# Patient Record
Sex: Male | Born: 1935
Health system: Southern US, Community
[De-identification: ages and names within clinical notes are randomized; demographics above are authoritative.]

## PROBLEM LIST (undated history)

## (undated) DIAGNOSIS — N183 Chronic kidney disease, stage 3 unspecified: Secondary | ICD-10-CM

## (undated) DIAGNOSIS — M1990 Unspecified osteoarthritis, unspecified site: Secondary | ICD-10-CM

## (undated) DIAGNOSIS — I5042 Chronic combined systolic (congestive) and diastolic (congestive) heart failure: Secondary | ICD-10-CM

## (undated) DIAGNOSIS — I493 Ventricular premature depolarization: Secondary | ICD-10-CM

## (undated) DIAGNOSIS — I472 Ventricular tachycardia: Secondary | ICD-10-CM

## (undated) DIAGNOSIS — E039 Hypothyroidism, unspecified: Secondary | ICD-10-CM

## (undated) DIAGNOSIS — G4730 Sleep apnea, unspecified: Secondary | ICD-10-CM

## (undated) DIAGNOSIS — D649 Anemia, unspecified: Secondary | ICD-10-CM

## (undated) DIAGNOSIS — C4359 Malignant melanoma of other part of trunk: Secondary | ICD-10-CM

## (undated) DIAGNOSIS — Z8739 Personal history of other diseases of the musculoskeletal system and connective tissue: Secondary | ICD-10-CM

## (undated) DIAGNOSIS — I447 Left bundle-branch block, unspecified: Secondary | ICD-10-CM

## (undated) DIAGNOSIS — E785 Hyperlipidemia, unspecified: Secondary | ICD-10-CM

## (undated) DIAGNOSIS — M353 Polymyalgia rheumatica: Secondary | ICD-10-CM

## (undated) DIAGNOSIS — I2510 Atherosclerotic heart disease of native coronary artery without angina pectoris: Secondary | ICD-10-CM

## (undated) DIAGNOSIS — I4729 Other ventricular tachycardia: Secondary | ICD-10-CM

## (undated) DIAGNOSIS — M797 Fibromyalgia: Secondary | ICD-10-CM

## (undated) HISTORY — DX: Hyperlipidemia, unspecified: E78.5

## (undated) HISTORY — DX: Hypothyroidism, unspecified: E03.9

## (undated) HISTORY — PX: CATARACT EXTRACTION W/ INTRAOCULAR LENS  IMPLANT, BILATERAL: SHX1307

## (undated) HISTORY — DX: Polymyalgia rheumatica: M35.3

## (undated) HISTORY — DX: Atherosclerotic heart disease of native coronary artery without angina pectoris: I25.10

---

## 1998-02-09 ENCOUNTER — Ambulatory Visit (HOSPITAL_COMMUNITY): Admission: RE | Admit: 1998-02-09 | Discharge: 1998-02-09 | Payer: Self-pay

## 1998-02-21 ENCOUNTER — Ambulatory Visit (HOSPITAL_BASED_OUTPATIENT_CLINIC_OR_DEPARTMENT_OTHER): Admission: RE | Admit: 1998-02-21 | Discharge: 1998-02-21 | Payer: Self-pay

## 1998-02-28 ENCOUNTER — Ambulatory Visit (HOSPITAL_COMMUNITY): Admission: RE | Admit: 1998-02-28 | Discharge: 1998-03-01 | Payer: Self-pay

## 1998-06-02 ENCOUNTER — Ambulatory Visit (HOSPITAL_COMMUNITY): Admission: RE | Admit: 1998-06-02 | Discharge: 1998-06-02 | Payer: Self-pay

## 1999-05-01 HISTORY — PX: AXILLARY LYMPH NODE DISSECTION: SHX5229

## 1999-05-01 HISTORY — PX: MELANOMA EXCISION: SHX5266

## 1999-07-12 ENCOUNTER — Encounter: Admission: RE | Admit: 1999-07-12 | Discharge: 1999-07-12 | Payer: Self-pay

## 1999-07-13 ENCOUNTER — Ambulatory Visit (HOSPITAL_BASED_OUTPATIENT_CLINIC_OR_DEPARTMENT_OTHER): Admission: RE | Admit: 1999-07-13 | Discharge: 1999-07-13 | Payer: Self-pay

## 1999-09-26 ENCOUNTER — Ambulatory Visit (HOSPITAL_BASED_OUTPATIENT_CLINIC_OR_DEPARTMENT_OTHER): Admission: RE | Admit: 1999-09-26 | Discharge: 1999-09-26 | Payer: Self-pay

## 1999-09-26 ENCOUNTER — Encounter (INDEPENDENT_AMBULATORY_CARE_PROVIDER_SITE_OTHER): Payer: Self-pay | Admitting: Specialist

## 1999-12-11 ENCOUNTER — Ambulatory Visit (HOSPITAL_BASED_OUTPATIENT_CLINIC_OR_DEPARTMENT_OTHER): Admission: RE | Admit: 1999-12-11 | Discharge: 1999-12-11 | Payer: Self-pay

## 1999-12-11 ENCOUNTER — Encounter (INDEPENDENT_AMBULATORY_CARE_PROVIDER_SITE_OTHER): Payer: Self-pay | Admitting: *Deleted

## 2001-04-28 ENCOUNTER — Ambulatory Visit (HOSPITAL_COMMUNITY): Admission: RE | Admit: 2001-04-28 | Discharge: 2001-04-28 | Payer: Self-pay | Admitting: Cardiology

## 2001-04-28 HISTORY — PX: CARDIAC CATHETERIZATION: SHX172

## 2001-05-13 ENCOUNTER — Encounter: Payer: Self-pay | Admitting: Surgery

## 2001-05-15 ENCOUNTER — Inpatient Hospital Stay (HOSPITAL_COMMUNITY): Admission: RE | Admit: 2001-05-15 | Discharge: 2001-05-20 | Payer: Self-pay | Admitting: Surgery

## 2001-05-15 ENCOUNTER — Encounter: Payer: Self-pay | Admitting: Surgery

## 2001-05-15 HISTORY — PX: CORONARY ARTERY BYPASS GRAFT: SHX141

## 2001-05-16 ENCOUNTER — Encounter: Payer: Self-pay | Admitting: Surgery

## 2001-05-17 ENCOUNTER — Encounter: Payer: Self-pay | Admitting: Surgery

## 2001-06-03 ENCOUNTER — Encounter (HOSPITAL_COMMUNITY): Admission: RE | Admit: 2001-06-03 | Discharge: 2001-09-01 | Payer: Self-pay | Admitting: Cardiology

## 2003-01-18 ENCOUNTER — Encounter: Payer: Self-pay | Admitting: Gastroenterology

## 2003-09-07 ENCOUNTER — Ambulatory Visit (HOSPITAL_COMMUNITY): Admission: RE | Admit: 2003-09-07 | Discharge: 2003-09-07 | Payer: Self-pay | Admitting: Internal Medicine

## 2007-11-07 ENCOUNTER — Encounter: Admission: RE | Admit: 2007-11-07 | Discharge: 2007-11-07 | Payer: Self-pay | Admitting: Internal Medicine

## 2007-12-22 ENCOUNTER — Encounter: Admission: RE | Admit: 2007-12-22 | Discharge: 2007-12-22 | Payer: Self-pay | Admitting: Rheumatology

## 2008-02-03 ENCOUNTER — Encounter: Payer: Self-pay | Admitting: Gastroenterology

## 2008-02-06 ENCOUNTER — Encounter: Payer: Self-pay | Admitting: Gastroenterology

## 2008-03-12 ENCOUNTER — Ambulatory Visit: Payer: Self-pay | Admitting: Gastroenterology

## 2008-03-12 DIAGNOSIS — D509 Iron deficiency anemia, unspecified: Secondary | ICD-10-CM | POA: Insufficient documentation

## 2008-03-12 DIAGNOSIS — K219 Gastro-esophageal reflux disease without esophagitis: Secondary | ICD-10-CM | POA: Insufficient documentation

## 2008-03-18 LAB — CONVERTED CEMR LAB: Tissue Transglutaminase Ab, IgA: 0.5 units (ref <7)

## 2008-04-06 ENCOUNTER — Encounter: Payer: Self-pay | Admitting: Gastroenterology

## 2008-04-06 ENCOUNTER — Ambulatory Visit: Payer: Self-pay | Admitting: Gastroenterology

## 2008-04-06 LAB — CONVERTED CEMR LAB: UREASE: NEGATIVE

## 2008-04-08 ENCOUNTER — Encounter: Payer: Self-pay | Admitting: Gastroenterology

## 2009-05-12 ENCOUNTER — Encounter: Admission: RE | Admit: 2009-05-12 | Discharge: 2009-08-10 | Payer: Self-pay | Admitting: Internal Medicine

## 2010-06-01 NOTE — Miscellaneous (Signed)
Summary: Clotest  Clinical Lists Changes  Orders: Added new Test order of TLB-H Pylori Screen Gastric Biopsy (83013-CLOTEST) - Signed 

## 2010-06-01 NOTE — Procedures (Signed)
Summary: Colonoscopy   Colonoscopy  Procedure date:  04/06/2008  Findings:      Location:  [facility].    COLONOSCOPY PROCEDURE REPORT  PATIENT:  Gary Gonzalez, Gary Gonzalez  MR#:  [id number] BIRTHDATE:   [birth year]   GENDER:   male  ENDOSCOPIST:   Venita Lick. Russella Dar, MD, [facility] Referred by: Rodrigo Ran, M.D.  PROCEDURE DATE:  04/06/2008 PROCEDURE:  Colonoscopy, diagnostic ASA CLASS:   Class II INDICATIONS: 1) iron deficiency anemia   MEDICATIONS:    Fentanyl 75 mcg IV, Versed 9 mg IV  DESCRIPTION OF PROCEDURE:   After the risks benefits and alternatives of the procedure were thoroughly explained, informed consent was obtained.  Digital rectal exam was performed and revealed no abnormalities.   The LB [id number] endoscope was introduced through the anus and advanced to the cecum, which was identified by both the appendix and ileocecal valve, limited by a tortuous colon.    The quality of the prep was excellent.  The instrument was then slowly withdrawn as the colon was fully examined. <<PROCEDUREIMAGES>>              <<OLD IMAGES>>  FINDINGS:  Mild diverticulosis was found in the transverse colon.  Moderate diverticulosis was found in the sigmoid colon (see image16, image17, and image18).  The examination was otherwise normal.   Retroflexed views in the rectum revealed no abnormalities.    The time to cecum =  10.5  minutes. The scope withdrawn time = 10.5 min from the patient and the procedure completed.  COMPLICATIONS:   None  ENDOSCOPIC IMPRESSION:  1) Mild diverticulosis in the transverse colon  2) Moderate diverticulosis in the sigmoid colon  3) Otherwise normal examination RECOMMENDATIONS:  1) upper endoscopy today  2) high fiber diet  REPEAT EXAM:   No recall planned due to age and absence of polyps on todays exam   _______________________________ Venita Lick. Russella Dar, MD, [facility]    This report was created from the original endoscopy report, which was reviewed  and signed by the above listed endoscopist.

## 2010-06-01 NOTE — Procedures (Signed)
Summary: Colonoscopy   Colonoscopy  Procedure date:  01/18/2003  Findings:      Results: Hemorrhoids.     Results: Diverticulosis.       Location:  [facility].    Patient Name: Gary Gonzalez, Gary Gonzalez. MRN:  Procedure Procedures: Colonoscopy CPT: [phone].  Personnel: Endoscopist: Venita Lick. Russella Dar, MD, [facility].  Referred By: Rodrigo Ran, MD.  Exam Location: Exam performed in Outpatient Clinic. Outpatient  Patient Consent: Procedure, Alternatives, Risks and Benefits discussed, consent obtained, from patient. Consent was obtained by the RN.  Indications Symptoms: Weight Loss.  History  Pre-Exam Physical: Performed Jan 18, 2003. Entire physical exam was normal.  Exam Exam: Extent of exam reached: Cecum, extent intended: Cecum.  The cecum was identified by appendiceal orifice and IC valve. Colon retroflexion performed. ASA Classification: II. Tolerance: good.  Monitoring: Pulse and BP monitoring, Oximetry used. Supplemental O2 given.  Colon Prep Used Golytely for colon prep. Prep results: good.  Sedation Meds: Patient assessed and found to be appropriate for moderate (conscious) sedation. Fentanyl 100 mcg. given IV. Versed 10 mg. given IV.  Findings NORMAL EXAM: Splenic Flexure.  DIVERTICULOSIS: Transverse Colon. Not bleeding. ICD9: Diverticulosis: 562.10. Comments: scattered diverticulosis.  - DIVERTICULOSIS: Descending Colon to Sigmoid Colon. Not bleeding. ICD9: Diverticulosis: 562.10.  NORMAL EXAM: Cecum to Hepatic Flexure.  HEMORRHOIDS: Internal. Size: Small. Not bleeding. Not thrombosed. ICD9: Hemorrhoids, Internal: 455.0.    Comments: Tortuous colon-moderately difficult procedure Assessment  Diagnoses: 562.10: Diverticulosis.  455.0: Hemorrhoids, Internal.   Events  Unplanned Interventions: No intervention was required.  Unplanned Events: There were no complications. Plans Medication Plan: Continue current medications.  Patient  Education: Patient given standard instructions for: Diverticulosis. Hemorrhoids.  Disposition: After procedure patient sent to recovery. After recovery patient sent home.  Scheduling/Referral: Referring provider, to Rodrigo Ran, MD,    This report was created from the original endoscopy report, which was reviewed and signed by the above listed endoscopist.    cc: Rodrigo Ran, MD

## 2010-06-01 NOTE — Procedures (Signed)
Summary: EGD   EGD  Procedure date:  04/06/2008  Findings:      Location: [facility]    ENDOSCOPY PROCEDURE REPORT  PATIENT:  Gary Gonzalez, Gary Gonzalez  MR#:  [id number] BIRTHDATE:   Dec 18, 1926   GENDER:   male  ENDOSCOPIST:   Venita Lick. Russella Dar, MD, [facility] Referred by: Rodrigo Ran, M.D.  PROCEDURE DATE:  04/06/2008 PROCEDURE:  EGD with biopsy ASA CLASS:   Class II INDICATIONS: GERD, iron deficiency anemia   MEDICATIONS:    Fentanyl 25 mcg IV, Versed 1 mg IV TOPICAL ANESTHETIC:   Exactacain Spray  DESCRIPTION OF PROCEDURE:   After the risks benefits and alternatives of the procedure were thoroughly explained, informed consent was obtained.  The LB [id number]-H180 [id number] endoscope was introduced through the mouth and advanced to the second portion of the duodenum, without limitations.  The instrument was slowly withdrawn as the mucosa was fully examined. <<PROCEDUREIMAGES>>      <<OLD IMAGES>>  An ulcer was found in the bulb of the duodenum. It was 4 mm in size (see image4).  Mild gastritis was found at the pylorus.    Retroflexed views revealed no abnormalities.    The scope was then withdrawn from the patient and the procedure completed.  COMPLICATIONS:   None  ENDOSCOPIC IMPRESSION:  1) 4 mm ulcer in the bulb of duodenum  2) Mild gastritis at the pylorus RECOMMENDATIONS:  1) await biopsy results  2) PPI qam  REPEAT EXAM:   No   _______________________________ Venita Lick. Russella Dar, MD, [facility]      REPORT OF SURGICAL PATHOLOGY   Case #: [id number] Patient Name: Gary, Gonzalez. Office Chart Number:  [id number]   MRN: [id number] Pathologist: Adiel Erney. Luisa Hart, MD DOB/Age  [birth year] (Age: 33)    Gender: M Date Taken:  04/06/2008 Date Received: 04/07/2008   FINAL DIAGNOSIS   ***MICROSCOPIC EXAMINATION AND DIAGNOSIS***   DUODENUM:  BENIGN SMALL BOWEL MUCOSA.  NO ACTIVE INFLAMMATION OR VILLOUS ATROPHY IDENTIFIED.   COMMENT There is small bowel mucosa with normal villous  architecture and no objective increase in inflammation.  No villous atrophy, active inflammation or other significant changes identified.   kv Date Reported:  04/08/2008     Beulah Gandy. Luisa Hart, MD *** Electronically Signed Out By JDP ***                     April 08, 2008 MRN: [id number]    [facility] [address] [place], [place]  [address]    Dear Gary Gonzalez,  I am pleased to inform you that the biopsies taken during your recent endoscopic examination did not show any evidence of cancer upon pathologic examination. The duodenal and gastric biopsies were normal.  Continue with the treatment plan as outlined on the day of your      exam.  Please call us if you are having persistent problems or have questions about your condition that have not been fully answered at this time.  Sincerely,  Meryl Dare MD [facility]  This letter has been electronically signed by your physician.    This report was created from the original endoscopy report, which was reviewed and signed by the above listed endoscopist.

## 2010-06-01 NOTE — Procedures (Signed)
Summary: Endoscopy   EGD  Procedure date:  01/18/2003  Findings:      Findings: Esophagitis  Location: [facility]    Patient Name: Gary Gonzalez, Gary Gonzalez. MRN:  Procedure Procedures: Panendoscopy (EGD) CPT: 43235.  Personnel: Endoscopist: Venita Lick. Russella Dar, MD, [facility].  Referred By: Rodrigo Ran, MD.  Exam Location: Exam performed in Outpatient Clinic. Outpatient  Patient Consent: Procedure, Alternatives, Risks and Benefits discussed, consent obtained, from patient. Consent was obtained by the RN.  Indications Symptoms: Weight loss.  History  Pre-Exam Physical: Performed Jan 18, 2003  Entire physical exam was normal.  Exam Exam Info: Maximum depth of insertion Duodenum, intended Duodenum. Vocal cords not visualized. Gastric retroflexion performed. ASA Classification: II. Tolerance: excellent.  Sedation Meds: Patient assessed and found to be appropriate for moderate (conscious) sedation. Residual sedation present from prior procedure today. Cetacaine Spray 2 sprays given aerosolized.  Monitoring: BP and pulse monitoring done. Oximetry used. Supplemental O2 given  Findings Normal: Proximal Esophagus to Mid Esophagus.  ESOPHAGEAL INFLAMMATION: Severity is moderate, erosions present.  [place] Classification: Grade A. ICD9: Esophagitis, Reflux: 530.11.  HIATAL HERNIA: Regular, 2 cms. in length. ICD9: Hernia, Hiatal: 553.3. Normal: Fundus to Duodenal 2nd Portion.   Assessment  Diagnoses: 530.11: Esophagitis, Reflux.  553.3: Hernia, Hiatal.   Events  Unplanned Intervention: No unplanned interventions were required.  Unplanned Events: There were no complications. Plans Medication(s): Continue current medications. PPI: Pantoprazole/Protonix 40 mg QAM,   Patient Education: Patient given standard instructions for: Hiatal Hernia. Reflux.  Disposition: After procedure patient sent to recovery. After recovery patient sent home.  Scheduling: Referring  provider, to Rodrigo Ran, MD, around Feb 15, 2003.  Office Visit, to [employer]. Russella Dar, MD, [facility], prn    This report was created from the original endoscopy report, which was reviewed and signed by the above listed endoscopist.    cc: Rodrigo Ran, MD

## 2010-06-01 NOTE — Letter (Signed)
Summary: Patient Notice-Endo Biopsy Results  [facility]  [address] [place], [place] [address]   Phone: [phone]  Fax: [phone]        April 08, 2008 MRN: [id number]    Gary Gonzalez [address] [place], [place]  [address]    Dear Mr. SERANO,  I am pleased to inform you that the biopsies taken during your recent endoscopic examination did not show any evidence of cancer upon pathologic examination. The duodenal and gastric biopsies were normal.  Continue with the treatment plan as outlined on the day of your      exam.  Please call us if you are having persistent problems or have questions about your condition that have not been fully answered at this time.  Sincerely,  Meryl Dare MD [facility]  This letter has been electronically signed by your physician.

## 2010-06-13 ENCOUNTER — Other Ambulatory Visit: Payer: Self-pay | Admitting: Dermatology

## 2010-09-15 NOTE — Cardiovascular Report (Signed)
[place]. [facility]  Patient:    Gary Gonzalez, Gary Gonzalez Visit Number: [id number] MRN: [id number]          Service Type: CAT Location: [facility] 2899 10 Attending Physician:  [place], Peter Manning Dictated by:   [name]. [place], M.D. Proc. Date: 04/28/01 Admit Date:  04/28/2001 Discharge Date: 04/28/2001   CC:         Rodrigo Ran, M.D.  Alleen Borne, M.D.   Cardiac Catheterization  INDICATIONS:  The patient is a 77 year old white male generally in excellent health who has had recent episodes of near syncope.  Subsequently, stress echocardiogram showed significant evidence of ischemia in the anterior apical and septal wall motion areas.  Access is via the right femoral artery using standard Seldinger technique.  INSTRUMENTS:  A 6 French 4 cm right and left Judkins catheter.  A 6 French pigtail catheter, 6 French arterial sheath.  MEDICATIONS:  Local anesthesia with 1% Xylocaine, contrast 150 cc of Omnipaque.  RESULTS:  Hemodynamic data: 1. Aortic pressure is 123/55 with a mean of 84. 2. Left ventricular pressure is 118 with an EP of 12 mmHg.  Angiographic data: 1. Left coronary artery rises and distributes normally.  There is moderate    calcification in the proximal LAD.  Left main coronary is without    significant stenosis. 2. The left anterior descending coronary artery has a diffuse segmental    disease involving the proximal to mid-vessel.  This entire segment is    diffusely diseased up to 70-80%.  This also involves the bifurcation with    the first diagonal which has a 90% ostial stenosis.  This LAD is    approximately 2.5 mm in diameter. 3. Left circumflex coronary has a bulky plaque in the mid-vessel up to 50-60%. 4. The right coronary artery is a dominant vessel.  It is without significant    disease.  Left ventriculography (performed in the RAO view):  Demonstrates normal left ventricular size and contractility with normal systolic  function.  Ejection fraction is estimated at 60%.  There is no mitral regurgitation prolapse.  FINAL INTERPRETATION: 1. Two vessel obstructive atherosclerotic coronary artery disease.  The    patient has diffuse segmental disease involving the proximal to mid-left    anterior descending coronary artery and the origin of the first diagonal    branch which would be consistent with his prior stress echocardiogram    findings.  This lesion would be poorly suited to catheter-based    intervention. 2. Normal left ventricular function.  PLAN:  Would consider for coronary artery bypass surgery with arterial grafting of the diagonal and LAD and also grafting of the circumflex coronary artery. Dictated by:   [name]. [place], M.D. Attending Physician:  [place], Peter Manning DD:  04/28/01 TD:  04/28/01 Job: [id number]

## 2010-09-15 NOTE — H&P (Signed)
[place]. [facility]  Patient:    Gary Gonzalez, Gary Gonzalez Visit Number: [id number] MRN: [id number]          Service Type: Attending:  [name]. [place]il[place], M.D. Dictated by:   [name]. [place], M.D. Adm. Date:  04/21/01   CC:         Rodrigo Ran, M.D.   History and Physical  DATE OF BIRTH: 23-Mar-1927  CHIEF COMPLAINT: Mr. Abdalla is a 77 year old white male, admitted for cardiac catheterization following an abnormal stress echocardiogram study.  HISTORY OF PRESENT ILLNESS: The patient in general has been in good health. He does have a history of mild hypercholesterolemia.  Approximately one month ago while attending the [employer] [facility] [place] football game the patient developed a near syncopal episode.  This was associated with symptoms of severe light headedness, sweatiness, and feeling that he might black out.  He laid down for a while and his symptoms abated, and altogether lasted less than one hour.  He has felt fine since then but has noticed some mild light headedness when he has been playing tennis.  He subsequently underwent an echocardiogram on March 26, 2001.  This demonstrated global hypokinesia with moderate hypokinesia involving the inferior basal wall.  His left ventricular function was felt to be mildly reduced, with ejection fraction of 40-50%.  He was also noted to have mild to moderate mitral insufficiency.  The patient was subsequently referred for a stress echocardiogram.  This was performed on April 21, 2001.  With exercise the patient developed dyspnea. His ECG showed 3 mm ST segment depression in leads 2, 3, aVF, and V4 through V6, consistent with ischemia.  His echocardiogram again demonstrated global hypokinesia at rest.  With exercise he developed severe hypokinesia of the septum, anterior wall, and apex, which would be consistent with ischemia. Because of these abnormal findings he is now admitted for cardiac catheterization.  PAST  MEDICAL HISTORY:  1. History of melanoma, status post resection and axillary dissection.  2. He was diagnosed with polymyalgia rheumatica in 1996 and was treated with     steroids for a period of time.  3. He had an acute febrile illness with diffuse myalgias in June 2002.  He has no history of diabetes or hypertension.  ALLERGIES: None known.  CURRENT MEDICATIONS: None.  SOCIAL HISTORY: The patient is retired.  He previously worked in the [employer].  He is married and has seven children.  He denies tobacco use.  He drinks occasional alcohol socially.  FAMILY HISTORY: Father died at age 7 of old age.  Mother died at age 37 with liver cancer.  He has three siblings in good health.  REVIEW OF SYSTEMS: Otherwise unremarkable.  PHYSICAL EXAMINATION:  GENERAL: The patient is a pleasant, thin white male in no apparent distress.  VITAL SIGNS: Blood pressure 130/62, pulse 54 and regular.  HEENT: Unremarkable.  PERRLA.  EOMI.  Oropharynx clear.  NECK: Supple without JVD, adenopathy, thyromegaly, or bruits.  LUNGS: Clear to auscultation and percussion.  CARDIAC: Regular rate and rhythm, normal S1 and S2, without gallops.  He has a soft grade 1-2/6 systolic murmur, heard best at the apex.  ABDOMEN: Soft, nontender, without masses or hepatosplenomegaly.  EXTREMITIES: Femoral and pedal pulses 2+ and symmetric.  No edema.  NEUROLOGIC: Nonfocal.  LABORATORY DATA: Resting ECG is normal.  IMPRESSION:  1. History of near syncope.  2. Abnormal stress Cardiolite study.  Findings were most consistent with  obstructive coronary disease.  This could represent a primary     cardiomyopathy.  3. Hypercholesterolemia.  4. History of melanoma.  5. History of polymyalgia rheumatica.  PLAN: The patient will be admitted for cardiac catheterization, with further therapy pending these results. Dictated by:   [name]. [place]il[place], M.D. Attending:  [name]. [place], M.D. DD:  04/21/01 TD:   04/22/01 Job: [id number]

## 2010-09-15 NOTE — Discharge Summary (Signed)
[place]. [facility]  Patient:    Gary Gonzalez, Gary Gonzalez Visit Number: [id number] MRN: [id number]          Service Type: SUR Location: 2000 2023 01 Attending Physician:  Cleatrice Burke Dictated by:   Maxwell Marion, RNFA Admit Date:  05/15/2001 Discharge Date: 05/20/2001   CC:         Alleen Borne, M.D.  Rodrigo Ran, M.D.  [name]. [place], M.D.   Discharge Summary  DATE OF BIRTH:  Feb 22, 1927.  DATE OF SURGERY:  05/15/01.  ADMISSION DIAGNOSIS:  Two vessel coronary artery disease.  PAST MEDICAL HISTORY: 1. Metastatic melanoma, 1999.    Status post left axillary dissection and immunotherapy, no recurrence. 2. Hypercholesterolemia.  ALLERGIES:  This man has no known drug allergies.  DISCHARGE DIAGNOSES: 1. Two vessel coronary artery disease status post coronary artery bypass graft 2. Postoperative atrial fibrillation, resolved.  BRIEF HISTORY:  The patient is a 77 year old Caucasian male who presented to his primary care [facility] in 11/02 after an episode of near syncope.  Dr. Rodrigo Ran recommended a stress echocardiogram which was performed on December 23rd and showed evidence of ischemia.  Cardiac catheterization was recommended and was performed on 04/28/01 and revealed severe two vessel coronary artery disease with normal LV function.  The patient was then referred to Dr. Laneta Simmers who evaluated him in the CVTS office on January 10th.  After examination of the patient and review of the records, including the catheterization films, Dr. Laneta Simmers recommended coronary artery bypass grafting for treatment of his two vessel disease.  The risks and benefits were discussed with the patient and he agreed to proceed.  On January 4th Doppler studies were performed at the CVTS office which revealed no significant coronary artery disease, and his ABIs were noted to be greater than 1.0 bilaterally.  HOSPITAL COURSE:  On 05/15/01 the patient was electively  admitted to [facility] under the care of Dr. Evelene Croon.  He underwent uncomplicated coronary artery bypass grafting times three.  Grafts placed at the time of procedure included a sequential graft from the left internal mammary artery to the diagonal and left anterior descending artery, saphenous vein graft was grafted to the obtuse marginal.  Vein was harvested from the right lower leg for the vein graft.  He tolerated the procedure well and was transferred in stable condition to the SICU.  He has remained hemodynamically stable throughout his postoperative course.  He did develop an atrial fibrillation with a rapid ventricular response early in the postoperative period.  He was started on amiodarone and converted to normal sinus rhythm. He has maintained normal sinus rhythm since then.  His amiodarone has been converted from intravenous to p.o. and he will go home on amiodarone.  The patient is making very good progress and recovery from his surgery.  He is anticipated being ready for discharge home tomorrow, May 20, 2001.  CONDITION ON DISCHARGE:  Improved.  INSTRUCTIONS ON DISCHARGE:  Medications, activity, diet, wound care and follow up appointments.  Please see the discharge instruction sheet for details.  MEDICATIONS ON DISCHARGE: 1. Enteric coated aspirin 325 mg p.o. q.d. 2. Ultram 50 mg one to two p.o. q.4-6h p.r.n. for pain. 3. Amiodarone 200 mg p.o. b.i.d. 4. Ferrous gluconate 300 mg p.o. q.d.  Patient has been instructed to resume the following home medications: 1. Toprol XL 25 mg p.o. q.d. k 2. Pravachol 40 mg p.o. q.d. 3. Multivitamin, one q.d.  FOLLOW UP: 1.  Patient has been asked to arrange an appointment to see Dr. Swaziland in his    office in approximately two weeks and have a chest x-ray taken at that    time. 2. He has an appointment to see Dr. Laneta Simmers at the CVTS office on June 10, 2001 at 9:45 a.m. Dictated by:   Maxwell Marion,  RNFA Attending Physician:  Cleatrice Burke DD:  05/19/01 TD:  05/20/01 Job: [id number]

## 2010-09-15 NOTE — Op Note (Signed)
[place]. [facility]  Patient:    Gary Gonzalez, PRETTYMAN Visit Number: [id number] MRN: [id number]          Service Type: SUR Location: [id number] Attending Physician:  Cleatrice Burke Dictated by:   Alleen Borne, M.D. Proc. Date: 05/15/01 Admit Date:  05/15/2001   CC:         [name]. Swaziland, M.D.  Cath lab   Operative Report  PREOPERATIVE DIAGNOSIS:  Severe two-vessel coronary artery disease.  POSTOPERATIVE DIAGNOSIS:  Severe two-vessel coronary artery disease.  OPERATION PERFORMED:  Median sternotomy, extracorporeal circulation, coronary artery bypass graft surgery x 3 using a sequential left internal mammary artery graft to the diagonal branch of the left anterior descending and the left anterior descending itself, with a saphenous vein graft to the obtuse marginal branch of the left circumflex coronary artery.  SURGEON:  Alleen Borne, M.D.  ASSISTANT:  Areta Haber, P.A.  ANESTHESIA:  General endotracheal.  INDICATIONS FOR PROCEDURE:  The patient is a 77 year old gentleman who had been in good health with the exception of a metastatic melanoma in 1999.  He recently presented with an episode of near syncope.  He subsequently underwent a stress echocardiogram which showed significant evidence of ischemia in the anterior and apical and septal areas.  He underwent cardiac catheterization on April 28, 2001 by Dr. Peter [place]aziland and this showed severe two-vessel disease with normal left ventricular function.  The LAD had diffuse proximal 70 to 80% stenosis involving the bifurcation of the first diagonal branch which had 90% ostial stenosis.  The left circumflex had a bulky plaque in the midvessel up to 50 to 60% stenosis.  The right coronary artery was dominant without stenosis.  Left ventricular ejection fraction was 60%.  After review of the angiogram and examination of the patient, it was felt that coronary artery bypass surgery was the  best treatment.  He did have a history of metastatic melanoma in 1999 but had no evidence of recurrence following treatment.  I discussed the operative procedure with the patient and his wife including alternatives to surgery, benefits, and risks including bleeding, possible blood transfusion, infection, stroke, myocardial infarction, and death.   understood and agreed to proceed with surgery.  DESCRIPTION OF PROCEDURE:  The patient was taken to the operating room and placed on the table in supine position.  After induction of general endotracheal anesthesia, a Foley catheter was placed in the bladder using sterile technique.  Then the chest, abdomen and both lower extremities were prepped and draped in the usual sterile manner.  The chest was entered through a median sternotomy incision and the pericardium opened in the midline. Examination of the heart showed good ventricular contractility.  The ascending aorta had no palpable plaques in it.  Then the left internal mammary artery was harvested from the chest wall as a pedicle graft.  This was a medium caliber vessel with excellent blood flow through it.  At the same time a segment of greater saphenous vein was harvested from the right lower lobe and this vein was of medium size and good quality.  Then the patient was heparinized and when an adequate activated clotting time was achieved, the distal ascending aorta was cannulated using 20 French aortic cannula for arterial inflow.  Venous outflow was achieved using a two-stage venous cannula through the right atrial appendage.  An antegrade cardioplegia and vent cannula was inserted into the aortic root.  The patient was placed on  cardiopulmonary bypass and the distal coronary arteries were identified.  The LAD was a large graftable vessel and was heavily diseased proximally but the remainder of the vessel appeared free of disease.  The diagonal branch was also a large vessel that was  diseased proximally but otherwise free of disease.  The obtuse marginal branch was a large graftable vessel with no disease.  Then the aorta was cross-clamped and 500 cc of cold blood antegrade cardioplegia was administered in the aortic root with quick arrest of the heart.  Systemic hypothermia to 20 degrees centigrade and topical hypothermia with iced saline was used.  A temperature probe was placed in the septum and an insulating pad in the pericardium.  The first distal anastomosis was performed to the obtuse marginal branch.  The internal diameter was 1.75 mm.  The conduit used was a segment of  greater saphenous vein.  Anastomosis was performed in end-to-side manner using continuous 7-0 Prolene suture.  The flow was measured through the graft and was excellent.  The second distal anastomosis was performed to the diagonal branch.  The internal diameter was 1.75 mm.  The conduit used was the left internal mammary graft and this was brought through an opening in the left pericardium anterior to the phrenic nerve.  It was anastomosed to the diagonal branch in a sequential side-to-side manner using continuous 8-0 Prolene suture.  The pedicle was tacked to the epicardium with 6-0 Prolene sutures.  The patient was then given another dose of cardioplegia.  The third distal anastomosis was performed to the midportion of the left anterior descending coronary artery.  The internal diameter in this area was about 2 mm.  The conduit used was a left internal mammary graft and this was anastomosed in a sequential end-to-side manner using continuous 8-0 Prolene suture.  The pedicle was tacked to the epicardium with 6-0 Prolene sutures. The patient was rewarmed to 37 degrees and the clamp removed from the mammary pedicle.  There was rapid warming of the ventricular septum and return of spontaneous ventricular fibrillation.  The crossclamp was removed with a time  of 49 minutes and the patient  spontaneously converted to sinus rhythm.  A partial occlusion clamp was placed on the aortic root and a single proximal vein graft anastomosis was performed in end-to-side manner using continuous 6-0 Prolene suture.  The clamps were removed, the vein grafts deaired and the clamps removed from them.  The proximal and distal anastomoses appeared hemostatic and the line of the grafts satisfactory.  Graft markers were placed around the proximal anastomoses.  Two temporary right ventricular and right atrial pacing wires were placed and brought out through the skin.  When the patient had rewarmed to 37 degrees centigrade, he was weaned from cardiopulmonary bypass on no inotropioc agents.  Total bypass time was 72 minutes.  Cardiac function appeared excellent with a cardiac output of 5L per minute.  Protamine was given and venous and aortic cannulas were removed without difficulty.  Hemostasis was achieved.  Three chest tubes were placed with a tube in the posterior pericardium and one in the left pleural space and one in the anterior mediastinum.  The pericardium was reapproximated over the heart.  The sternum was closed with #6 stainless steel wires.  The fascia was closed with continuous #1 Vicryl suture.  The subcutaneous tissues were closed using continuous 2-0 Vicryl and the skin with 3-0 Vicryl subcuticular closure. The lower extremity vein harvest site was closed in layers in a  similar manner with staples used for the skin.  The sponge, needle and instrument counts were correct according to the scrub nurse.  A dry sterile dressing was applied over the incisions and around the chest tubes which were hooked to Pleur-evac suction.  The patient remained hemodynamically stable and was transported to the SICU in guarded but stable condition. Dictated by:   Alleen Borne, M.D. Attending Physician:  Cleatrice Burke DD:  05/15/01 TD:  05/15/01 Job: [id number]

## 2010-09-15 NOTE — Op Note (Signed)
[place]. [facility]  Patient:    Gary Gonzalez, Gary Gonzalez                          MRN: [id number] Proc. Date: 12/11/99 Adm. Date:  [id number] Attending:  Gennie Alma CC:         [name]   Operative Report  [facility] [place] NUMBER:  [phone]  PREOPERATIVE DIAGNOSES: 1. Residual atypical melanocytic lesion of left upper back. 2. Ulcerating lesion of left back.  POSTOPERATIVE DIAGNOSES: 1. Residual atypical melanocytic lesion of left upper back. 2. Ulcerating lesion of left back.  OPERATION:  Excision of lesion #1 and lesion #2.  SURGEON:  Milus Mallick, M.D.  ANESTHESIA:  Local infiltration with 1% Xylocaine, 30 cc, and monitored anesthesia care.  HISTORY:  This patient with known metastatic melanoma had a lesion excised from his left upper back that was an atypical melanocytic lesion.  Because of closeness of margins, it was recommended by the pathology department that a conservative re-excision be done.  The conservative re-excision was done on Sep 26, 1999, and noted within the specimen was residual atypical melanocytic lesion, lateral margin involved.  There appeared to be a persistence of atypical melanocytic proliferation with the lymphocytic infiltrate in the central cross sections of the ellipse that was submitted.  In addition, the patient has developed an ulcerated lesion inferior to the previous lesion that will be excised at the same time.  DESCRIPTION OF PROCEDURE:  Under adequate perioperative intravenous sedation the patient was placed in the prone position and his back was prepped and draped in the usual fashion.  There was a scar in the left upper back that was erythematous, it was transverse, and was approximately 5-7 cm in length. An elliptical incision was designed to excise the scar, and in particular, get more widely around the scar in the lateral aspect of it.  An elliptical incision was designed that was 11 cm in transverse  length, and approximately 3 cm in width.  The area was infiltrated with 1% Xylocaine.  The elliptical incision was made and carried down to the deep fascia.  Bleeders were electrocaoagulated.  The entire area was excised by scalpel.  Some electrocoagulation was used.  Hemostasis was ascertained.  Bleeders were electrocoagulated.  The subcuticular layer was reapproximated with interrupted sutures of 4-0 Vicryl, and the skin was approximated with continuous suture of 4-0 nylon.  The specimen was marked with a double suture on the medial aspect and a single suture on the superior edge.  It was sent for routine pathologic study.  Approximately 6-8 inches inferior to that area and more lateral was an ulcerating lesion of the skin of the back, left side.  It was excised with an elliptical incision.  It was carried down to the deep fascia after infiltrating the area well with 1% Xylocaine.  Bleeders were electrocoagulated.  The subcuticular layer was reapproximated with 4-0 Vicryl, and the skin was approximated with a continuous suture of 4-0 nylon.  Sterile dressings were applied.  Estimated blood loss from the procedure was negligible.  The patient tolerated the procedure well, and left the operating room in satisfactory condition. DD:  12/11/99 TD:  12/11/99 Job: [id number]

## 2010-12-14 ENCOUNTER — Other Ambulatory Visit: Payer: Self-pay | Admitting: Dermatology

## 2011-03-30 ENCOUNTER — Ambulatory Visit (INDEPENDENT_AMBULATORY_CARE_PROVIDER_SITE_OTHER): Payer: Medicare Other | Admitting: Cardiology

## 2011-03-30 ENCOUNTER — Encounter: Payer: Self-pay | Admitting: Cardiology

## 2011-03-30 VITALS — BP 144/70 | HR 67 | Ht 72.0 in | Wt 155.0 lb

## 2011-03-30 DIAGNOSIS — E7800 Pure hypercholesterolemia, unspecified: Secondary | ICD-10-CM

## 2011-03-30 DIAGNOSIS — Z951 Presence of aortocoronary bypass graft: Secondary | ICD-10-CM

## 2011-03-30 DIAGNOSIS — I2510 Atherosclerotic heart disease of native coronary artery without angina pectoris: Secondary | ICD-10-CM

## 2011-03-30 DIAGNOSIS — I493 Ventricular premature depolarization: Secondary | ICD-10-CM

## 2011-03-30 DIAGNOSIS — I4949 Other premature depolarization: Secondary | ICD-10-CM

## 2011-03-30 NOTE — Progress Notes (Signed)
   Gary Gonzalez Date of Birth: Jun 06, 1926 Medical Record #[id number]  History of Present Illness: Gary Gonzalez is seen today at the request of Dr. Waynard Edwards to reestablish cardiac care. He is a pleasant 77 year old white male who has a history of coronary disease. He presented in 2002 with increased angina. He subsequently underwent coronary bypass surgery x3 by Dr. Laneta Simmers. This included an LIMA graft sequentially to the LAD and diagonal branches and a saphenous vein graft to the obtuse marginal branch. He has not had followup cardiac evaluation for over the past 5 years. He reports that he is doing fairly well. Ever since his bypass surgery he has had a little problem with equilibrium. He does report one episode of significant lightheadedness while driving this past summer. He otherwise denies any dizziness or lightheadedness. He has had no chest pain or shortness of breath.  No current outpatient prescriptions on file prior to visit.    Not on File  Past Medical History  Diagnosis Date  . Hyperlipidemia   . Melanoma   . Coronary artery disease     two vessel disease with CABG  . Hypothyroidism   . PMR (polymyalgia rheumatica)     Past Surgical History  Procedure Date  . Coronary artery bypass graft 05/15/2001    x3 sequential left internal mammary artery graft to the diagonal branch of the left anterior descending and the left anterior descending itself, with a saphenous vein graft to the obtuse marginal branch of the left circumflex coronary artery   . Cardiac catheterization 04/28/2001  . Melanoma excision 2001    off of back    History  Smoking status  . Never Smoker   Smokeless tobacco  . Never Used    History  Alcohol Use No    Family History  Problem Relation Age of Onset  . Liver cancer Mother   . Prostate cancer Father   . Hyperlipidemia Son   . Hyperlipidemia Daughter     Review of Systems: The review of systems is positive for chronic disequilibrium.  All other  systems were reviewed and are negative.  Physical Exam: BP 144/70  Pulse 67  Ht 6' (1.829 m)  Wt 155 lb (70.308 kg)  BMI 21.02 kg/m2 He is a pleasant white male in no acute distress.The patient is alert and oriented x 3.  The mood and affect are normal.  The skin is warm and dry.  Color is normal.  The HEENT exam reveals that the sclera are nonicteric.  The mucous membranes are moist.  The carotids are 2+ without bruits.  There is no thyromegaly.  There is no JVD.  The lungs are clear.  The chest wall is non tender.  The heart exam reveals a regular rate with a normal S1 and S2.  There are no murmurs, gallops, or rubs.  The PMI is not displaced.   Abdominal exam reveals good bowel sounds.  There is no guarding or rebound.  There is no hepatosplenomegaly or tenderness.  There are no masses.  Exam of the legs reveal no clubbing, cyanosis, or edema.  The legs are without rashes.  The distal pulses are intact.  Cranial nerves II - XII are intact.  Motor and sensory functions are intact.  The gait is normal.  LABORATORY DATA:  ECG today demonstrates normal sinus rhythm with sinus arrhythmia and frequent PVCs. He has an incomplete left bundle branch block. Assessment / Plan:

## 2011-03-30 NOTE — Assessment & Plan Note (Signed)
These PVCs do not appear to be symptomatic. If his stress test is normal then I recommend no further treatment.

## 2011-03-30 NOTE — Assessment & Plan Note (Signed)
He is on long-term therapy with aspirin and statin. I recommended a followup nuclear stress test.

## 2011-03-30 NOTE — Patient Instructions (Signed)
Continue your current medication  We will schedule you for a nuclear stress test.   

## 2011-04-05 ENCOUNTER — Encounter (HOSPITAL_COMMUNITY): Payer: Medicare Other | Admitting: Radiology

## 2011-04-10 ENCOUNTER — Ambulatory Visit (HOSPITAL_COMMUNITY): Payer: Medicare Other | Attending: Cardiology | Admitting: Radiology

## 2011-04-10 DIAGNOSIS — R0609 Other forms of dyspnea: Secondary | ICD-10-CM | POA: Insufficient documentation

## 2011-04-10 DIAGNOSIS — I493 Ventricular premature depolarization: Secondary | ICD-10-CM

## 2011-04-10 DIAGNOSIS — I4949 Other premature depolarization: Secondary | ICD-10-CM | POA: Insufficient documentation

## 2011-04-10 DIAGNOSIS — R42 Dizziness and giddiness: Secondary | ICD-10-CM | POA: Insufficient documentation

## 2011-04-10 DIAGNOSIS — E785 Hyperlipidemia, unspecified: Secondary | ICD-10-CM | POA: Insufficient documentation

## 2011-04-10 DIAGNOSIS — Z951 Presence of aortocoronary bypass graft: Secondary | ICD-10-CM | POA: Insufficient documentation

## 2011-04-10 DIAGNOSIS — R0602 Shortness of breath: Secondary | ICD-10-CM

## 2011-04-10 DIAGNOSIS — R0989 Other specified symptoms and signs involving the circulatory and respiratory systems: Secondary | ICD-10-CM | POA: Insufficient documentation

## 2011-04-10 DIAGNOSIS — R55 Syncope and collapse: Secondary | ICD-10-CM

## 2011-04-10 DIAGNOSIS — I2510 Atherosclerotic heart disease of native coronary artery without angina pectoris: Secondary | ICD-10-CM

## 2011-04-10 DIAGNOSIS — I447 Left bundle-branch block, unspecified: Secondary | ICD-10-CM

## 2011-04-10 DIAGNOSIS — R002 Palpitations: Secondary | ICD-10-CM | POA: Insufficient documentation

## 2011-04-10 DIAGNOSIS — I4460 Unspecified fascicular block: Secondary | ICD-10-CM | POA: Insufficient documentation

## 2011-04-10 DIAGNOSIS — R5381 Other malaise: Secondary | ICD-10-CM | POA: Insufficient documentation

## 2011-04-10 MED ORDER — TECHNETIUM TC 99M TETROFOSMIN IV KIT
11.0000 | PACK | Freq: Once | INTRAVENOUS | Status: AC | PRN
  Administered 2011-04-10: 11 via INTRAVENOUS

## 2011-04-10 MED ORDER — ADENOSINE (DIAGNOSTIC) 3 MG/ML IV SOLN
0.5600 mg/kg | Freq: Once | INTRAVENOUS | Status: AC
  Administered 2011-04-10: 39 mg via INTRAVENOUS

## 2011-04-10 MED ORDER — TECHNETIUM TC 99M TETROFOSMIN IV KIT
33.0000 | PACK | Freq: Once | INTRAVENOUS | Status: AC | PRN
  Administered 2011-04-10: 33 via INTRAVENOUS

## 2011-04-10 NOTE — Progress Notes (Signed)
Patient was changed from Rest Stress Myoview to Rest Adenosine due to ILBBB.

## 2011-04-10 NOTE — Progress Notes (Signed)
Gary Gonzalez  Cardiology Nuclear Med Study  Gary Gonzalez is a 77 y.o. male [id number] July 11, 1926   Nuclear Med Background Indication for Stress Test:  Evaluation for Ischemia and Graft Patency History:  '02 Stress Echo:Significant antero-apical, septal wall ischemia>Cath>1/03 CABG; '08 MPS:OK per patient Cardiac Risk Factors: ILBBB and Lipids  Symptoms:  Dizziness/Near Syncope While Driving, DOE, Fatigue and Palpitations   Nuclear Pre-Procedure Caffeine/Decaff Intake:  None NPO After: 7:00pm   Lungs:  Clear.  O2 SAT 98% on RA IV 0.9% NS with Angio Cath:  20g  IV Site: R Wrist  IV Started by:  Cathlyn Parsons, RN  Chest Size (in):  40 Cup Size: n/a  Height: 6' (1.829 m)  Weight:  154 lb (69.854 kg)  BMI:  Body mass index is 20.89 kg/(m^2). Tech Comments:  n/a    Nuclear Med Study 1 or 2 day study: 1 day  Stress Test Type:  Adenosine  Reading MD: Willa Rough, MD  Order Authorizing Provider:  Peter [place], MD  Resting Radionuclide: Technetium 21m Tetrofosmin  Resting Radionuclide Dose: 11.0 mCi   Stress Radionuclide:  Technetium 108m Tetrofosmin  Stress Radionuclide Dose: 33.0 mCi           Stress Protocol Rest HR: 60 Stress HR: 69  Rest BP: Sitting:135/71  Standing:110/67 Stress BP: 119/52  Exercise Time (min): n/a METS: n/a   Predicted Max HR: 138 bpm % Max HR: 50 bpm Rate Pressure Product: 8211   Dose of Adenosine (mg):  39.2 Dose of Lexiscan: n/a mg  Dose of Atropine (mg): n/a Dose of Dobutamine: n/a mcg/kg/min (at max HR)  Stress Test Technologist: Smiley Houseman, CMA-N  Nuclear Technologist:  Domenic Polite, CNMT     Rest Procedure:  Myocardial perfusion imaging was performed at rest 45 minutes following the intravenous administration of Technetium 16m Tetrofosmin.  Rest ECG: ILBBB with occasional PVC's.  Stress Procedure:  The patient received IV adenosine at 140  mcg/kg/min for 4 minutes.  There was a brief episode of 2nd degree AVB and occasional PVC's with infusion.  He did c/o chest pressure with infusion.  Technetium 80m Tetrofosmin was injected at the 2 minute mark and quantitative spect images were obtained after a 45 minute delay.  Stress ECG: No significant change from baseline ECG  QPS Raw Data Images:  Patient motion noted; appropriate software correction applied. Stress Images:  Mild decreased activity in the anterior wall. This is not diagnostic. Rest Images:  Same as stress. Subtraction (SDS):  No evidence of ischemia. Transient Ischemic Dilatation (Normal <1.22):  1.09 Lung/Heart Ratio (Normal <0.45):  0.28  Quantitative Gated Spect Images QGS EDV:  150 ml QGS ESV:  89 ml QGS cine images:  Inferior and septal hypokinesis. QGS EF: 40%  Impression Exercise Capacity:  Adenosine study with no exercise. BP Response:  Normal blood pressure response. Clinical Symptoms:  Chest pressure ECG Impression:  No significant ST segment change suggestive of ischemia. Comparison with Prior Nuclear Study: No images to compare  Overall Impression:  There is no marked scar or ischemia. There is LV dysfunction.  Some of the motion abnormality probably related to LBBB and history of CABG.  Willa Rough

## 2011-04-12 ENCOUNTER — Telehealth: Payer: Self-pay | Admitting: *Deleted

## 2011-04-12 ENCOUNTER — Telehealth: Payer: Self-pay | Admitting: Cardiology

## 2011-04-12 DIAGNOSIS — I2510 Atherosclerotic heart disease of native coronary artery without angina pectoris: Secondary | ICD-10-CM

## 2011-04-12 NOTE — Progress Notes (Signed)
lmtcb

## 2011-04-12 NOTE — Telephone Encounter (Signed)
Notified wife of stress test results. Will schedule Echo

## 2011-04-12 NOTE — Telephone Encounter (Signed)
Message copied by Lorayne Bender on Thu Apr 12, 2011  1:51 PM ------      Message from: [place], [name]      Created: Wed Apr 11, 2011 11:27 AM       No clear ischemia. EF is moderately reduced. Was normal prior to CABG. Need to get an Echo then see back.      Theron Arista [place]

## 2011-04-12 NOTE — Telephone Encounter (Signed)
FU Call: Pt returning call from [place] regarding results of stress test. Please return pt call to discuss further.

## 2011-04-13 NOTE — Telephone Encounter (Signed)
Pt rtn call and he needs a call back before 1:30 he will be leaving to go out of town

## 2011-04-13 NOTE — Telephone Encounter (Signed)
Results were discussed with pt and he was transferred to the check out desk to schedule the echo.

## 2011-04-18 ENCOUNTER — Ambulatory Visit (HOSPITAL_COMMUNITY): Payer: Medicare Other | Attending: Cardiology | Admitting: Radiology

## 2011-04-18 DIAGNOSIS — I079 Rheumatic tricuspid valve disease, unspecified: Secondary | ICD-10-CM | POA: Insufficient documentation

## 2011-04-18 DIAGNOSIS — I059 Rheumatic mitral valve disease, unspecified: Secondary | ICD-10-CM | POA: Insufficient documentation

## 2011-04-18 DIAGNOSIS — I379 Nonrheumatic pulmonary valve disorder, unspecified: Secondary | ICD-10-CM | POA: Insufficient documentation

## 2011-04-18 DIAGNOSIS — I2510 Atherosclerotic heart disease of native coronary artery without angina pectoris: Secondary | ICD-10-CM

## 2011-04-23 ENCOUNTER — Telehealth: Payer: Self-pay | Admitting: *Deleted

## 2011-04-23 DIAGNOSIS — R0989 Other specified symptoms and signs involving the circulatory and respiratory systems: Secondary | ICD-10-CM

## 2011-04-23 MED ORDER — RAMIPRIL 5 MG PO CAPS: 5.0000 mg | ORAL_CAPSULE | Freq: Every day | ORAL | Status: DC

## 2011-04-23 NOTE — Telephone Encounter (Signed)
Notified of echo results. Will start Ramapril 5 mg. Will send into [facility] on [address]. Scheduled for lab 1/16. FU w/Dr. Swaziland in 6 mo. Will send to Dr. Waynard Edwards.

## 2011-04-23 NOTE — Telephone Encounter (Signed)
Message copied by Lorayne Bender on Mon Apr 23, 2011 12:26 PM ------      Message from: [place], [name]      Created: Wed Apr 18, 2011  9:45 PM       EF is better than on nuclear study. Still mildly reduced. Mild MR. I think he would benefit from an ACEi. Start ramapril 5 mg daily. Check Bmet in 3-4 weeks. Follow up with me in 6 months.      Theron Arista [place]

## 2011-04-30 ENCOUNTER — Telehealth: Payer: Self-pay | Admitting: Cardiology

## 2011-04-30 NOTE — Telephone Encounter (Signed)
New Problem:    Patient called wanitng to know the results of all of the diagnostic testing he had done recently.  Please call back on 05/02/11 because he is leaving town today and won't be back until late in the afternoon tomorrow.

## 2011-05-02 NOTE — Telephone Encounter (Signed)
Patient was called and told of echo 04/18/11 results.EF better but still mildly reduced.Advised to take altace as prescribed.

## 2011-05-02 NOTE — Telephone Encounter (Signed)
Will forward to Anabel Halon, LPN for Dr. Swaziland.

## 2011-05-16 ENCOUNTER — Other Ambulatory Visit (INDEPENDENT_AMBULATORY_CARE_PROVIDER_SITE_OTHER): Payer: Medicare Other | Admitting: *Deleted

## 2011-05-16 DIAGNOSIS — R0989 Other specified symptoms and signs involving the circulatory and respiratory systems: Secondary | ICD-10-CM

## 2011-05-16 DIAGNOSIS — R9439 Abnormal result of other cardiovascular function study: Secondary | ICD-10-CM

## 2011-05-16 LAB — BASIC METABOLIC PANEL
BUN: 23 mg/dL (ref 6–23)
CO2: 28 mEq/L (ref 19–32)
Calcium: 8.7 mg/dL (ref 8.4–10.5)
Chloride: 107 mEq/L (ref 96–112)
Creatinine, Ser: 1.1 mg/dL (ref 0.4–1.5)
GFR: 66.62 mL/min (ref >60.00)
Glucose, Bld: 112 mg/dL — ABNORMAL HIGH (ref 70–99)
Potassium: 4.9 mEq/L (ref 3.5–5.1)
Sodium: 140 mEq/L (ref 135–145)

## 2011-06-26 ENCOUNTER — Other Ambulatory Visit: Payer: Self-pay | Admitting: Dermatology

## 2011-09-21 ENCOUNTER — Telehealth: Payer: Self-pay | Admitting: Cardiology

## 2011-09-21 NOTE — Telephone Encounter (Signed)
FYi: Pt calling re ramipril, has been dizzy, lightheadedness, dr perini wants pt to come off, pls all if any problem

## 2011-09-21 NOTE — Telephone Encounter (Signed)
Patient called stated he saw Dr.Perini yesterday 09/20/11 and he stopped ramipril due to dizziness,light headed and he blacked out x 1 last week.States his b/p yesterday 126/58. States he was told to check with Dr.Jordan to make sure ok to stop ramipril.Patient was told Dr.Jordan not in office today.Spoke to Norma Fredrickson NP she advised ok to stop ramipril and monitor b/p.Appointment scheduled with Dr.Jordan 10/24/11.Advised to call back sooner if needed.

## 2011-10-24 ENCOUNTER — Encounter: Payer: Self-pay | Admitting: Cardiology

## 2011-10-24 ENCOUNTER — Ambulatory Visit (INDEPENDENT_AMBULATORY_CARE_PROVIDER_SITE_OTHER): Payer: Medicare Other | Admitting: Cardiology

## 2011-10-24 VITALS — BP 126/77 | HR 66 | Ht 72.0 in | Wt 152.0 lb

## 2011-10-24 DIAGNOSIS — I493 Ventricular premature depolarization: Secondary | ICD-10-CM

## 2011-10-24 DIAGNOSIS — I2510 Atherosclerotic heart disease of native coronary artery without angina pectoris: Secondary | ICD-10-CM

## 2011-10-24 DIAGNOSIS — I4949 Other premature depolarization: Secondary | ICD-10-CM

## 2011-10-24 DIAGNOSIS — R42 Dizziness and giddiness: Secondary | ICD-10-CM

## 2011-10-24 NOTE — Assessment & Plan Note (Signed)
His recent stress test showed no evidence of perfusion abnormalities. He will remain on aspirin and statin therapy.

## 2011-10-24 NOTE — Progress Notes (Signed)
Gary Gonzalez Date of Birth: 06/18/1926 Medical Record #[id number]  History of Present Illness: Gary Gonzalez is seen today for followup. He was last seen in November. He has been having problems with recurrent dizziness. This typically occurs when he gets up from a sitting position. It is worse in the early morning. Actually he does quite well with exercise and has been consistently exercising 4- 5 days a week with aerobic activity. He recently has also started flexibility exercises. He has no dizziness with his exercise. On his previous evaluation we scheduled him for a nuclear stress test. It showed no perfusion abnormalities but his ejection fraction was low at 40%. He did have a left bundle branch block. We then performed an echocardiogram which showed an ejection fraction of 45-50% with inferior hypokinesis. There was mild mitral insufficiency. We started him on Altace 5 mg daily. In March she had an episode of syncope after getting up out of a chair. His Altace was discontinued. Since then his dizziness has improved but is still modest. He denies any chest pain or shortness of breath. He does note some mild palpitations when he is lying down.  Current Outpatient Prescriptions on File Prior to Visit  Medication Sig Dispense Refill  . aspirin 325 MG tablet Take 325 mg by mouth daily.        . CRESTOR 20 MG tablet Take 1/2  daily      . multivitamin (THERAGRAN) per tablet Take 1 tablet by mouth daily.        Marland Kitchen SYNTHROID 50 MCG tablet Take 50 mcg by mouth daily.         No Known Allergies  Past Medical History  Diagnosis Date  . Hyperlipidemia   . Melanoma   . Coronary artery disease     two vessel disease with CABG  . Hypothyroidism   . PMR (polymyalgia rheumatica)   . Dizzy     Past Surgical History  Procedure Date  . Coronary artery bypass graft 05/15/2001    x3 sequential left internal mammary artery graft to the diagonal branch of the left anterior descending and the left anterior  descending itself, with a saphenous vein graft to the obtuse marginal branch of the left circumflex coronary artery   . Cardiac catheterization 04/28/2001  . Melanoma excision 2001    off of back    History  Smoking status  . Never Smoker   Smokeless tobacco  . Never Used    History  Alcohol Use No    Family History  Problem Relation Age of Onset  . Liver cancer Mother   . Prostate cancer Father   . Hyperlipidemia Son   . Hyperlipidemia Daughter     Review of Systems: The review of systems is positive for chronic dizziness.  All other systems were reviewed and are negative.  Physical Exam: BP 126/77  Pulse 66  Ht 6' (1.829 m)  Wt 152 lb (68.947 kg)  BMI 20.61 kg/m2 He is a pleasant white male in no acute distress.The patient is alert and oriented x 3.  The mood and affect are normal.  The skin is warm and dry.  Color is normal.  The HEENT exam reveals that the sclera are nonicteric.  The mucous membranes are moist.  The carotids are 2+ without bruits.  There is no thyromegaly.  There is no JVD.  The lungs are clear.  The chest wall is non tender.  The heart exam reveals a regular rate with frequent  extrasystoles and normal S1 and S2.  There are no murmurs, gallops, or rubs.  The PMI is not displaced.   Abdominal exam reveals good bowel sounds.  There is no guarding or rebound.  There is no hepatosplenomegaly or tenderness.  There are no masses.  Exam of the legs reveal no clubbing, cyanosis, or edema.  The legs are without rashes.  The distal pulses are intact.  Cranial nerves II - XII are intact.  Motor and sensory functions are intact.  The gait is normal.  LABORATORY DATA:   Assessment / Plan:

## 2011-10-24 NOTE — Assessment & Plan Note (Signed)
While his PVCs may be contributing somewhat to his dizziness I think attempts to suppress these would be detrimental particularly with beta blocker therapy. He has normal perfusion. LV function is only mildly reduced. There is no indication for defibrillator.

## 2011-10-24 NOTE — Assessment & Plan Note (Signed)
We had a long discussion concerning his dizziness. There is certainly an orthostatic component to this. I think that his symptoms are mostly of a vascular reflex issue. This was exacerbated with Altace. He also has frequent PVCs. He has mild left ventricular dysfunction. He is still able to function at a high level. I have not restricted his driving at this point. His symptoms almost always occur with change in position. I actually think that his exercise program will be beneficial for its conditioning aspects. I would avoid any antihypertensive therapy. I will plan on followup again in 6 months.

## 2011-10-24 NOTE — Patient Instructions (Signed)
Continue your medication and exercise program  I will see you again in 6 months.

## 2012-05-06 ENCOUNTER — Encounter: Payer: Self-pay | Admitting: Cardiology

## 2012-05-28 ENCOUNTER — Ambulatory Visit: Payer: Medicare Other | Admitting: Cardiology

## 2012-06-19 ENCOUNTER — Encounter: Payer: Self-pay | Admitting: Cardiology

## 2012-07-02 ENCOUNTER — Encounter: Payer: Self-pay | Admitting: Cardiology

## 2012-07-02 ENCOUNTER — Ambulatory Visit (INDEPENDENT_AMBULATORY_CARE_PROVIDER_SITE_OTHER): Payer: Medicare Other | Admitting: Cardiology

## 2012-07-02 VITALS — BP 122/60 | HR 62 | Ht 72.0 in | Wt 152.0 lb

## 2012-07-02 DIAGNOSIS — I493 Ventricular premature depolarization: Secondary | ICD-10-CM

## 2012-07-02 DIAGNOSIS — I4949 Other premature depolarization: Secondary | ICD-10-CM

## 2012-07-02 DIAGNOSIS — I2510 Atherosclerotic heart disease of native coronary artery without angina pectoris: Secondary | ICD-10-CM

## 2012-07-02 DIAGNOSIS — Z951 Presence of aortocoronary bypass graft: Secondary | ICD-10-CM

## 2012-07-02 NOTE — Progress Notes (Signed)
Gary Gonzalez Date of Birth: Apr 17, 1927 Medical Record #[id number]  History of Present Illness: Gary Gonzalez is seen today for followup. He reports he is doing well. He occasionally will notice a skipped heartbeat when he is at rest. This is consistent with PVCs he was documented to have previously. He still has some dizziness when he gets up suddenly but it is improved since he is off antihypertensive therapy. He denies any chest pain or shortness of breath. He does exercise regularly at the gym.  Current Outpatient Prescriptions on File Prior to Visit  Medication Sig Dispense Refill  . aspirin 325 MG tablet Take 325 mg by mouth daily.        . CRESTOR 20 MG tablet Take 1/2  daily      . multivitamin (THERAGRAN) per tablet Take 1 tablet by mouth daily.        Marland Kitchen SYNTHROID 50 MCG tablet Take 50 mcg by mouth daily.        No current facility-administered medications on file prior to visit.    No Known Allergies  Past Medical History  Diagnosis Date  . Hyperlipidemia   . Melanoma   . Coronary artery disease     two vessel disease with CABG  . Hypothyroidism   . PMR (polymyalgia rheumatica)   . Dizzy     Past Surgical History  Procedure Laterality Date  . Coronary artery bypass graft  05/15/2001    x3 sequential left internal mammary artery graft to the diagonal branch of the left anterior descending and the left anterior descending itself, with a saphenous vein graft to the obtuse marginal branch of the left circumflex coronary artery   . Cardiac catheterization  04/28/2001  . Melanoma excision  2001    off of back    History  Smoking status  . Never Smoker   Smokeless tobacco  . Never Used    History  Alcohol Use No    Family History  Problem Relation Age of Onset  . Liver cancer Mother   . Prostate cancer Father   . Hyperlipidemia Son   . Hyperlipidemia Daughter     Review of Systems: The review of systems is positive for chronic dizziness.  All other systems were  reviewed and are negative.  Physical Exam: BP 122/60  Pulse 62  Ht 6' (1.829 m)  Wt 152 lb (68.947 kg)  BMI 20.61 kg/m2 He is a pleasant white male in no acute distress.  The HEENT exam is normal. The carotids are 2+ without bruits.  There is no thyromegaly.  There is no JVD.  The lungs are clear.   The heart exam reveals a regular rate with frequent extrasystoles and normal S1 and S2.  There are no murmurs, gallops, or rubs.  The PMI is not displaced.   Abdominal exam reveals good bowel sounds.   There is no hepatosplenomegaly or tenderness.  There are no masses.  Exam of the legs reveal no clubbing, cyanosis, or edema.  The legs are without rashes.  The distal pulses are intact.  Cranial nerves II - XII are intact.  Motor and sensory functions are intact.  The gait is normal.  LABORATORY DATA:   Assessment / Plan: 1. Coronary disease status post CABG in 2003. Myoview study in December of 2012 showed no perfusion abnormalities. Ejection fraction was calculated at 40% with some dyssynergy related to his left bundle branch block. We will continue aspirin which I recommended reducing to 81 mg. He  will continue statin therapy. We'll avoid beta blocker given his history of dizziness.  2. LV dysfunction. Ejection fraction of 40% by Myoview study. 45-50% by echo in December of 2012. Patient is intolerant to beta blockers and ACE inhibitors because of dizziness. He is asymptomatic.  3. Dyslipidemia on Crestor.  4. Chronic PVCs, minimally symptomatic.

## 2012-07-02 NOTE — Patient Instructions (Signed)
Continue your current therapy  I will see you in 6 months.   

## 2013-07-24 ENCOUNTER — Other Ambulatory Visit: Payer: Self-pay | Admitting: Dermatology

## 2013-07-27 ENCOUNTER — Ambulatory Visit (INDEPENDENT_AMBULATORY_CARE_PROVIDER_SITE_OTHER): Payer: Medicare HMO | Admitting: Cardiology

## 2013-07-27 ENCOUNTER — Encounter: Payer: Self-pay | Admitting: Cardiology

## 2013-07-27 VITALS — BP 140/66 | HR 64 | Ht 72.0 in | Wt 151.0 lb

## 2013-07-27 DIAGNOSIS — E785 Hyperlipidemia, unspecified: Secondary | ICD-10-CM

## 2013-07-27 DIAGNOSIS — Z951 Presence of aortocoronary bypass graft: Secondary | ICD-10-CM

## 2013-07-27 DIAGNOSIS — I493 Ventricular premature depolarization: Secondary | ICD-10-CM

## 2013-07-27 DIAGNOSIS — I2510 Atherosclerotic heart disease of native coronary artery without angina pectoris: Secondary | ICD-10-CM

## 2013-07-27 DIAGNOSIS — I4949 Other premature depolarization: Secondary | ICD-10-CM

## 2013-07-27 NOTE — Progress Notes (Signed)
Gary Gonzalez Date of Birth: [birth year] Medical Record #[id number]  History of Present Illness: Gary Gonzalez is seen today for followup. He has a history of CAD s/p CABG in 2003. Last Myoview in Dec 2012 showed no perfusion abnormality. Echo showed EF 45-50%.   He still has some disequilibrium when he gets up suddenly but it is improved since he is off antihypertensive therapy. He denies any chest pain or shortness of breath. He does exercise regularly at the gym 4-5 days a week.  Current Outpatient Prescriptions on File Prior to Visit  Medication Sig Dispense Refill  . SYNTHROID 50 MCG tablet Take 50 mcg by mouth daily.        No current facility-administered medications on file prior to visit.    No Known Allergies  Past Medical History  Diagnosis Date  . Hyperlipidemia   . Melanoma   . Coronary artery disease     two vessel disease with CABG  . Hypothyroidism   . PMR (polymyalgia rheumatica)   . Dizzy     Past Surgical History  Procedure Laterality Date  . Coronary artery bypass graft  05/15/2001    x3 sequential left internal mammary artery graft to the diagonal branch of the left anterior descending and the left anterior descending itself, with a saphenous vein graft to the obtuse marginal branch of the left circumflex coronary artery   . Cardiac catheterization  04/28/2001  . Melanoma excision  2001    off of back    History  Smoking status  . Never Smoker   Smokeless tobacco  . Never Used    History  Alcohol Use No    Family History  Problem Relation Age of Onset  . Liver cancer Mother   . Prostate cancer Father   . Hyperlipidemia Son   . Hyperlipidemia Daughter     Review of Systems: The review of systems is positive for chronic dizziness.  All other systems were reviewed and are negative.  Physical Exam: BP 140/66  Pulse 64  Ht 6' (1.829 m)  Wt 151 lb (68.493 kg)  BMI 20.47 kg/m2 He is a pleasant white male in no acute distress.  The HEENT exam is  normal. The carotids are 2+ without bruits.  There is no thyromegaly.  There is no JVD.  The lungs are clear.   The heart exam reveals a regular rate with occ. extrasystoles and normal S1 and S2.  There are no murmurs, gallops, or rubs.  The PMI is not displaced.   Abdominal exam reveals good bowel sounds.   There is no hepatosplenomegaly or tenderness.  There are no masses.  Exam of the legs reveal no clubbing, cyanosis, or edema.  The legs are without rashes.  The distal pulses are intact.  Cranial nerves II - XII are intact.  Motor and sensory functions are intact.  The gait is normal.  LABORATORY DATA: Ecg shows NSR with occ. PVCs. LVH with QRS widening and nonspecific ST abnormality.  Assessment / Plan: 1. Coronary disease status post CABG in 2003. Myoview study in December of 2012 showed no perfusion abnormalities. Ejection fraction was calculated at 40% with some dyssynergy related to his left bundle branch block. Echo showed EF of 45-50%. We will continue aspirin. He will continue statin therapy. We'll avoid beta blocker/ACEi given his history of dizziness.  2. LV dysfunction. Ejection fraction of 40% by Myoview study. 45-50% by echo in December of 2012. Patient is intolerant to beta blockers and ACE  inhibitors because of dizziness. He is asymptomatic.  3. Dyslipidemia on Crestor.  4. Chronic PVCs, minimally symptomatic.

## 2013-07-27 NOTE — Patient Instructions (Signed)
Continue your current therapy  I will see you in 1 year.

## 2013-12-28 ENCOUNTER — Other Ambulatory Visit: Payer: Self-pay | Admitting: Dermatology

## 2014-07-23 ENCOUNTER — Encounter: Payer: Self-pay | Admitting: Cardiology

## 2014-07-23 ENCOUNTER — Ambulatory Visit (INDEPENDENT_AMBULATORY_CARE_PROVIDER_SITE_OTHER): Payer: Medicare HMO | Admitting: Cardiology

## 2014-07-23 VITALS — BP 140/72 | HR 68 | Ht 72.0 in | Wt 150.1 lb

## 2014-07-23 DIAGNOSIS — Z951 Presence of aortocoronary bypass graft: Secondary | ICD-10-CM | POA: Diagnosis not present

## 2014-07-23 DIAGNOSIS — I519 Heart disease, unspecified: Secondary | ICD-10-CM | POA: Insufficient documentation

## 2014-07-23 DIAGNOSIS — I25810 Atherosclerosis of coronary artery bypass graft(s) without angina pectoris: Secondary | ICD-10-CM | POA: Diagnosis not present

## 2014-07-23 DIAGNOSIS — E785 Hyperlipidemia, unspecified: Secondary | ICD-10-CM | POA: Diagnosis not present

## 2014-07-23 DIAGNOSIS — I493 Ventricular premature depolarization: Secondary | ICD-10-CM | POA: Diagnosis not present

## 2014-07-23 NOTE — Patient Instructions (Signed)
Continue your current therapy  I will see you in one year   

## 2014-07-23 NOTE — Progress Notes (Signed)
Gary Gonzalez Date of Birth: January 03, 1927 Medical Record #[id number]  History of Present Illness: Gary Gonzalez is seen today for followup. He has a history of CAD s/p CABG in 2003. Last Myoview in Dec 2012 showed no perfusion abnormality. Echo showed EF 45-50%.   He has chronic disequilibrium. He was intolerant of ACEi and beta blocker due to dizziness. He is now 81 years old. He is doing well from a cardiac standpoint without any chest pain or shortness of breath. He does exercise regularly at the gym 4-5 days a week. He thinks his equilibrium is getting worse and he doesn't have as much stamina as before. He was started on Repatha for his cholesterol.   Current Outpatient Prescriptions on File Prior to Visit  Medication Sig Dispense Refill  . aspirin 81 MG tablet Take 81 mg by mouth daily.    . rosuvastatin (CRESTOR) 10 MG tablet Take 10 mg by mouth daily.    Marland Kitchen SYNTHROID 50 MCG tablet Take 50 mcg by mouth daily.      No current facility-administered medications on file prior to visit.    No Known Allergies  Past Medical History  Diagnosis Date  . Hyperlipidemia   . Melanoma   . Coronary artery disease     two vessel disease with CABG  . Hypothyroidism   . PMR (polymyalgia rheumatica)   . Dizzy     Past Surgical History  Procedure Laterality Date  . Coronary artery bypass graft  05/15/2001    x3 sequential left internal mammary artery graft to the diagonal branch of the left anterior descending and the left anterior descending itself, with a saphenous vein graft to the obtuse marginal branch of the left circumflex coronary artery   . Cardiac catheterization  04/28/2001  . Melanoma excision  2001    off of back    History  Smoking status  . Never Smoker   Smokeless tobacco  . Never Used    History  Alcohol Use No    Family History  Problem Relation Age of Onset  . Liver cancer Mother   . Prostate cancer Father   . Hyperlipidemia Son   . Hyperlipidemia Daughter      Review of Systems: The review of systems is positive for chronic dizziness.  All other systems were reviewed and are negative.  Physical Exam: BP 140/72 mmHg  Pulse 68  Ht 6' (1.829 m)  Wt 150 lb 1.6 oz (68.085 kg)  BMI 20.35 kg/m2 He is a pleasant white male in no acute distress.  The HEENT exam is normal. The carotids are 2+ without bruits.  There is no thyromegaly.  There is no JVD.  The lungs are clear.   The heart exam reveals a regular rate with occ. extrasystoles and normal S1 and S2.  There are no murmurs, gallops, or rubs.  The PMI is not displaced.   Abdominal exam reveals good bowel sounds.   There is no hepatosplenomegaly or tenderness.  There are no masses.  Exam of the legs reveal no clubbing, cyanosis, or edema.  The legs are without rashes.  The distal pulses are intact.  Cranial nerves II - XII are intact.  Motor and sensory functions are intact.  The gait is normal.  LABORATORY DATA: Ecg today shows NSR. LVH with QRS widening. No acute change. I have personally reviewed and interpreted this study.   Assessment / Plan: 1. Coronary disease status post CABG in 2003. Myoview study in December of 2012  showed no perfusion abnormalities. Ejection fraction was calculated at 40% with some dyssynergy related to his left bundle branch block. Echo showed EF of 45-50%. We will continue aspirin. He will continue statin therapy. Intolerance of beta blocker/ACEi given his history of dizziness.  2. LV dysfunction. Ejection fraction of 40% by Myoview study. 45-50% by echo in December of 2012. Patient is intolerant to beta blockers and ACE inhibitors because of dizziness. He is asymptomatic.  3. Dyslipidemia on Crestor and now Repatha per Gary Gonzalez.  4. Chronic PVCs, minimally symptomatic.  He asked today how his pump is doing. We have not formally evaluated this since 2012 but at his age I explained that I don't recommend follow up studies unless he became symptomatic. Medication  options limited as noted above.

## 2014-11-11 ENCOUNTER — Encounter: Payer: Self-pay | Admitting: Cardiology

## 2014-11-11 ENCOUNTER — Ambulatory Visit (INDEPENDENT_AMBULATORY_CARE_PROVIDER_SITE_OTHER): Payer: Medicare HMO | Admitting: Cardiology

## 2014-11-11 VITALS — BP 128/58 | HR 82 | Ht 72.0 in | Wt 153.0 lb

## 2014-11-11 DIAGNOSIS — R42 Dizziness and giddiness: Secondary | ICD-10-CM

## 2014-11-11 DIAGNOSIS — I25810 Atherosclerosis of coronary artery bypass graft(s) without angina pectoris: Secondary | ICD-10-CM | POA: Diagnosis not present

## 2014-11-11 DIAGNOSIS — I4729 Other ventricular tachycardia: Secondary | ICD-10-CM

## 2014-11-11 DIAGNOSIS — Z951 Presence of aortocoronary bypass graft: Secondary | ICD-10-CM

## 2014-11-11 DIAGNOSIS — I472 Ventricular tachycardia: Secondary | ICD-10-CM

## 2014-11-11 DIAGNOSIS — I493 Ventricular premature depolarization: Secondary | ICD-10-CM

## 2014-11-11 DIAGNOSIS — R5383 Other fatigue: Secondary | ICD-10-CM | POA: Insufficient documentation

## 2014-11-11 MED ORDER — ATENOLOL 25 MG PO TABS: 25.0000 mg | ORAL_TABLET | Freq: Every day | ORAL | Status: DC

## 2014-11-11 NOTE — Patient Instructions (Addendum)
Stop Repatha  Increase Crestor to 10 mg daily  We will check blood work.  We will schedule you for an Echocardiogram and have you wear a monitor for 24 hours.  Start atenolol 25 mg daily.  Avoid caffeine

## 2014-11-11 NOTE — Addendum Note (Signed)
Addended by: Golden Hurter D on: 11/11/2014 05:56 PM   Modules accepted: Orders

## 2014-11-11 NOTE — Progress Notes (Signed)
Gary Gonzalez Date of Birth: 09-08-1926 Medical Record #[id number]  History of Present Illness: Gary Gonzalez is seen today as a work in. Since his visit in March he complains of a marked decrease in his energy level. He states that he wants to sleep all day and that his "gas tank is empty". He notes his heart is beating irregular and he feels that he needs to take a deep breath. No chest pain. No dizziness or syncope but maybe some lightheadedness.  He has a history of CAD s/p CABG in 2003. Last Myoview in Dec 2012 showed no perfusion abnormality. Echo showed EF 45-50%.   He has chronic disequilibrium. He was intolerant of ACEi and beta blocker in the past due to dizziness.  He was started on Repatha in March  for his cholesterol.   Current Outpatient Prescriptions on File Prior to Visit  Medication Sig Dispense Refill  . aspirin 81 MG tablet Take 81 mg by mouth daily.    Marland Kitchen Nystatin (NYAMYC) 100000 UNIT/GM POWD Apply 1 application topically as needed.    . rosuvastatin (CRESTOR) 10 MG tablet Take 10 mg by mouth daily.    Marland Kitchen SYNTHROID 50 MCG tablet Take 50 mcg by mouth daily.      No current facility-administered medications on file prior to visit.    No Known Allergies  Past Medical History  Diagnosis Date  . Hyperlipidemia   . Melanoma   . Coronary artery disease     two vessel disease with CABG  . Hypothyroidism   . PMR (polymyalgia rheumatica)   . Dizzy     Past Surgical History  Procedure Laterality Date  . Coronary artery bypass graft  05/15/2001    x3 sequential left internal mammary artery graft to the diagonal branch of the left anterior descending and the left anterior descending itself, with a saphenous vein graft to the obtuse marginal branch of the left circumflex coronary artery   . Cardiac catheterization  04/28/2001  . Melanoma excision  2001    off of back    History  Smoking status  . Never Smoker   Smokeless tobacco  . Never Used    History  Alcohol Use No     Family History  Problem Relation Age of Onset  . Liver cancer Mother   . Prostate cancer Father   . Hyperlipidemia Son   . Hyperlipidemia Daughter     Review of Systems: As noted in HPI.  All other systems were reviewed and are negative.  Physical Exam: BP 128/58 mmHg  Pulse 82  Ht 6' (1.829 m)  Wt 69.4 kg (153 lb)  BMI 20.75 kg/m2 He is a pleasant white male in no acute distress.  The HEENT exam is normal. The carotids are 2+ without bruits.  There is no thyromegaly.  There is no JVD.  The lungs are clear.   The heart exam reveals an  irrregular rate with frequent  Extrasystoles that are not well perfused. Normal S1 and S2.  There are no murmurs, gallops, or rubs.  The PMI is not displaced.   Abdominal exam reveals good bowel sounds.   There is no hepatosplenomegaly or tenderness.  There are no masses.  Exam of the legs reveal no clubbing, cyanosis, or edema.  The legs are without rashes.  The distal pulses are intact.  Cranial nerves II - XII are intact.  Motor and sensory functions are intact.  The gait is normal.  LABORATORY DATA: Ecg today shows  NSR with frequent PVCs, couplets, and triplets in repetitive pattern. Rate 77 bpm.  LVH with QRS widening.  I have personally reviewed and interpreted this study.   Assessment / Plan: 1. Marked fatigue associated with significant increase in ventricular ectopy with repetitive PVC triplets. I think his symptoms are mostly related to his ectopy. He has had isolated PVCs in the past but not to this degree. I have recommended starting atenolol 25 mg daily. We will check a BMET, Mg level, CBC, and TSH. Will have him wear a 24 hour Holter monitor and schedule him for an Echocardiogram. If symptoms persist may need to consdier AAD therapy. His symptoms did worsen temporally after starting Repatha. I think it is unlikely that this is having an effect but we will hold for now.   2. Coronary disease status post CABG in 2003. Myoview study in  December of 2012 showed no perfusion abnormalities. Ejection fraction was calculated at 40% with some dyssynergy related to his left bundle branch block. Echo showed EF of 45-50%. We will continue aspirin. He will continue statin therapy.   3. LV dysfunction. Ejection fraction of 40% by Myoview study. 45-50% by echo in December of 2012. Will update Echo now.  4. Dyslipidemia on Crestor and now Repatha. Will hold Repatha until we have a better handle on his current symptoms.

## 2014-11-12 LAB — CBC WITH DIFFERENTIAL/PLATELET
Basophils Absolute: 0 10*3/uL (ref 0.0–0.1)
Basophils Relative: 0 % (ref 0–1)
Eosinophils Absolute: 0.1 10*3/uL (ref 0.0–0.7)
Eosinophils Relative: 2 % (ref 0–5)
HCT: 36.4 % — ABNORMAL LOW (ref 39.0–52.0)
Hemoglobin: 11.5 g/dL — ABNORMAL LOW (ref 13.0–17.0)
Lymphocytes Relative: 23 % (ref 12–46)
Lymphs Abs: 1.5 10*3/uL (ref 0.7–4.0)
MCH: 29.1 pg (ref 26.0–34.0)
MCHC: 31.6 g/dL (ref 30.0–36.0)
MCV: 92.2 fL (ref 78.0–100.0)
MPV: 11.2 fL (ref 8.6–12.4)
Monocytes Absolute: 0.6 10*3/uL (ref 0.1–1.0)
Monocytes Relative: 9 % (ref 3–12)
Neutro Abs: 4.4 10*3/uL (ref 1.7–7.7)
Neutrophils Relative %: 66 % (ref 43–77)
Platelets: 267 10*3/uL (ref 150–400)
RBC: 3.95 MIL/uL — ABNORMAL LOW (ref 4.22–5.81)
RDW: 13.9 % (ref 11.5–15.5)
WBC: 6.6 10*3/uL (ref 4.0–10.5)

## 2014-11-12 LAB — BASIC METABOLIC PANEL
BUN: 26 mg/dL — ABNORMAL HIGH (ref 6–23)
CO2: 27 mEq/L (ref 19–32)
Calcium: 9 mg/dL (ref 8.4–10.5)
Chloride: 109 mEq/L (ref 96–112)
Creat: 1.28 mg/dL (ref 0.50–1.35)
Glucose, Bld: 89 mg/dL (ref 70–99)
Potassium: 4.8 mEq/L (ref 3.5–5.3)
Sodium: 144 mEq/L (ref 135–145)

## 2014-11-12 LAB — MAGNESIUM: Magnesium: 2.2 mg/dL (ref 1.5–2.5)

## 2014-11-12 LAB — TSH: TSH: 4.887 u[IU]/mL — ABNORMAL HIGH (ref 0.350–4.500)

## 2014-11-16 ENCOUNTER — Telehealth: Payer: Self-pay | Admitting: Cardiology

## 2014-11-16 NOTE — Telephone Encounter (Signed)
Pt's daughter Juliann Pulse called in wanting to know when the pt's lab results from 7/14 were going to be available so that she could he him access them on the computer . Please call  Thanks

## 2014-11-17 ENCOUNTER — Ambulatory Visit (HOSPITAL_BASED_OUTPATIENT_CLINIC_OR_DEPARTMENT_OTHER): Payer: Medicare HMO

## 2014-11-17 ENCOUNTER — Other Ambulatory Visit: Payer: Self-pay

## 2014-11-17 ENCOUNTER — Ambulatory Visit (INDEPENDENT_AMBULATORY_CARE_PROVIDER_SITE_OTHER): Payer: Medicare HMO

## 2014-11-17 DIAGNOSIS — R5383 Other fatigue: Secondary | ICD-10-CM | POA: Diagnosis not present

## 2014-11-17 DIAGNOSIS — I517 Cardiomegaly: Secondary | ICD-10-CM | POA: Insufficient documentation

## 2014-11-17 DIAGNOSIS — Z951 Presence of aortocoronary bypass graft: Secondary | ICD-10-CM

## 2014-11-17 DIAGNOSIS — R42 Dizziness and giddiness: Secondary | ICD-10-CM | POA: Diagnosis not present

## 2014-11-17 DIAGNOSIS — I25810 Atherosclerosis of coronary artery bypass graft(s) without angina pectoris: Secondary | ICD-10-CM

## 2014-11-17 DIAGNOSIS — I472 Ventricular tachycardia: Secondary | ICD-10-CM | POA: Diagnosis not present

## 2014-11-17 DIAGNOSIS — I129 Hypertensive chronic kidney disease with stage 1 through stage 4 chronic kidney disease, or unspecified chronic kidney disease: Secondary | ICD-10-CM | POA: Diagnosis present

## 2014-11-17 DIAGNOSIS — E039 Hypothyroidism, unspecified: Secondary | ICD-10-CM | POA: Diagnosis present

## 2014-11-17 DIAGNOSIS — E785 Hyperlipidemia, unspecified: Secondary | ICD-10-CM | POA: Diagnosis present

## 2014-11-17 DIAGNOSIS — I447 Left bundle-branch block, unspecified: Secondary | ICD-10-CM | POA: Diagnosis present

## 2014-11-17 DIAGNOSIS — I4729 Other ventricular tachycardia: Secondary | ICD-10-CM

## 2014-11-17 DIAGNOSIS — I340 Nonrheumatic mitral (valve) insufficiency: Secondary | ICD-10-CM | POA: Diagnosis present

## 2014-11-17 DIAGNOSIS — I2510 Atherosclerotic heart disease of native coronary artery without angina pectoris: Secondary | ICD-10-CM | POA: Diagnosis present

## 2014-11-17 DIAGNOSIS — D649 Anemia, unspecified: Secondary | ICD-10-CM | POA: Diagnosis present

## 2014-11-17 DIAGNOSIS — I252 Old myocardial infarction: Secondary | ICD-10-CM

## 2014-11-17 DIAGNOSIS — N183 Chronic kidney disease, stage 3 (moderate): Secondary | ICD-10-CM | POA: Diagnosis present

## 2014-11-17 DIAGNOSIS — Z7982 Long term (current) use of aspirin: Secondary | ICD-10-CM

## 2014-11-17 DIAGNOSIS — I493 Ventricular premature depolarization: Secondary | ICD-10-CM | POA: Diagnosis not present

## 2014-11-17 DIAGNOSIS — M353 Polymyalgia rheumatica: Secondary | ICD-10-CM | POA: Diagnosis present

## 2014-11-17 DIAGNOSIS — Z888 Allergy status to other drugs, medicaments and biological substances status: Secondary | ICD-10-CM

## 2014-11-17 DIAGNOSIS — I5043 Acute on chronic combined systolic (congestive) and diastolic (congestive) heart failure: Secondary | ICD-10-CM | POA: Diagnosis present

## 2014-11-18 ENCOUNTER — Inpatient Hospital Stay (HOSPITAL_COMMUNITY)
Admission: AD | Admit: 2014-11-18 | Discharge: 2014-11-22 | DRG: 286 | Disposition: A | Discharge status: Home / Self Care | Payer: Medicare HMO | Source: Ambulatory Visit | Attending: Cardiology | Admitting: Cardiology

## 2014-11-18 ENCOUNTER — Encounter (HOSPITAL_COMMUNITY): Payer: Self-pay | Admitting: Physician Assistant

## 2014-11-18 ENCOUNTER — Other Ambulatory Visit: Payer: Self-pay

## 2014-11-18 DIAGNOSIS — Z951 Presence of aortocoronary bypass graft: Secondary | ICD-10-CM | POA: Diagnosis not present

## 2014-11-18 DIAGNOSIS — R5383 Other fatigue: Secondary | ICD-10-CM | POA: Diagnosis present

## 2014-11-18 DIAGNOSIS — I5041 Acute combined systolic (congestive) and diastolic (congestive) heart failure: Secondary | ICD-10-CM | POA: Diagnosis not present

## 2014-11-18 DIAGNOSIS — I472 Ventricular tachycardia: Secondary | ICD-10-CM

## 2014-11-18 DIAGNOSIS — N183 Chronic kidney disease, stage 3 unspecified: Secondary | ICD-10-CM | POA: Diagnosis present

## 2014-11-18 DIAGNOSIS — I519 Heart disease, unspecified: Secondary | ICD-10-CM

## 2014-11-18 DIAGNOSIS — I2510 Atherosclerotic heart disease of native coronary artery without angina pectoris: Secondary | ICD-10-CM | POA: Diagnosis present

## 2014-11-18 DIAGNOSIS — I493 Ventricular premature depolarization: Secondary | ICD-10-CM | POA: Diagnosis present

## 2014-11-18 DIAGNOSIS — I4729 Other ventricular tachycardia: Secondary | ICD-10-CM

## 2014-11-18 HISTORY — DX: Ventricular premature depolarization: I49.3

## 2014-11-18 HISTORY — DX: Left bundle-branch block, unspecified: I44.7

## 2014-11-18 HISTORY — DX: Chronic kidney disease, stage 3 (moderate): N18.3

## 2014-11-18 HISTORY — DX: Other ventricular tachycardia: I47.29

## 2014-11-18 HISTORY — DX: Anemia, unspecified: D64.9

## 2014-11-18 HISTORY — DX: Personal history of other diseases of the musculoskeletal system and connective tissue: Z87.39

## 2014-11-18 HISTORY — DX: Unspecified osteoarthritis, unspecified site: M19.90

## 2014-11-18 HISTORY — DX: Chronic kidney disease, stage 3 unspecified: N18.30

## 2014-11-18 HISTORY — DX: Sleep apnea, unspecified: G47.30

## 2014-11-18 HISTORY — DX: Fibromyalgia: M79.7

## 2014-11-18 HISTORY — DX: Ventricular tachycardia: I47.2

## 2014-11-18 HISTORY — DX: Chronic combined systolic (congestive) and diastolic (congestive) heart failure: I50.42

## 2014-11-18 HISTORY — DX: Malignant melanoma of other part of trunk: C43.59

## 2014-11-18 LAB — BASIC METABOLIC PANEL
Anion gap: 8 (ref 5–15)
BUN: 31 mg/dL — ABNORMAL HIGH (ref 6–20)
CO2: 25 mmol/L (ref 22–32)
Calcium: 8.7 mg/dL — ABNORMAL LOW (ref 8.9–10.3)
Chloride: 107 mmol/L (ref 101–111)
Creatinine, Ser: 1.62 mg/dL — ABNORMAL HIGH (ref 0.61–1.24)
GFR calc Af Amer: 43 mL/min — ABNORMAL LOW (ref >60)
GFR calc non Af Amer: 37 mL/min — ABNORMAL LOW (ref >60)
Glucose, Bld: 94 mg/dL (ref 65–99)
Potassium: 4.6 mmol/L (ref 3.5–5.1)
Sodium: 140 mmol/L (ref 135–145)

## 2014-11-18 LAB — CBC
HCT: 35.3 % — ABNORMAL LOW (ref 39.0–52.0)
Hemoglobin: 11.2 g/dL — ABNORMAL LOW (ref 13.0–17.0)
MCH: 28.9 pg (ref 26.0–34.0)
MCHC: 31.7 g/dL (ref 30.0–36.0)
MCV: 91.2 fL (ref 78.0–100.0)
Platelets: 211 10*3/uL (ref 150–400)
RBC: 3.87 MIL/uL — ABNORMAL LOW (ref 4.22–5.81)
RDW: 13.6 % (ref 11.5–15.5)
WBC: 8.5 10*3/uL (ref 4.0–10.5)

## 2014-11-18 LAB — PROTIME-INR
INR: 1.25 (ref 0.00–1.49)
Prothrombin Time: 15.8 seconds — ABNORMAL HIGH (ref 11.6–15.2)

## 2014-11-18 MED ORDER — ROSUVASTATIN CALCIUM 10 MG PO TABS
10.0000 mg | ORAL_TABLET | Freq: Every day | ORAL | Status: DC
  Administered 2014-11-19: 10 mg via ORAL
  Filled 2014-11-18 (×3): qty 1

## 2014-11-18 MED ORDER — CARVEDILOL 6.25 MG PO TABS: 6.2500 mg | ORAL_TABLET | Freq: Two times a day (BID) | ORAL | Status: DC

## 2014-11-18 MED ORDER — SODIUM CHLORIDE 0.9 % IJ SOLN
3.0000 mL | Freq: Two times a day (BID) | INTRAMUSCULAR | Status: DC
  Administered 2014-11-18: 3 mL via INTRAVENOUS

## 2014-11-18 MED ORDER — LEVOTHYROXINE SODIUM 75 MCG PO TABS
75.0000 ug | ORAL_TABLET | Freq: Every day | ORAL | Status: DC
Start: 2014-11-19 — End: 2014-11-22
  Administered 2014-11-19 – 2014-11-22 (×4): 75 ug via ORAL
  Filled 2014-11-18 (×5): qty 1

## 2014-11-18 MED ORDER — HEPARIN SODIUM (PORCINE) 5000 UNIT/ML IJ SOLN
5000.0000 [IU] | Freq: Three times a day (TID) | INTRAMUSCULAR | Status: DC
  Administered 2014-11-18: 5000 [IU] via SUBCUTANEOUS
  Filled 2014-11-18 (×5): qty 1

## 2014-11-18 MED ORDER — ACETAMINOPHEN 325 MG PO TABS: 650.0000 mg | ORAL_TABLET | ORAL | Status: DC | PRN

## 2014-11-18 MED ORDER — ONDANSETRON HCL 4 MG/2ML IJ SOLN: 4.0000 mg | Freq: Four times a day (QID) | INTRAMUSCULAR | Status: DC | PRN

## 2014-11-18 MED ORDER — SODIUM CHLORIDE 0.9 % IV SOLN: 250.0000 mL | INTRAVENOUS | Status: DC | PRN

## 2014-11-18 MED ORDER — LISINOPRIL 2.5 MG PO TABS: 2.5000 mg | ORAL_TABLET | Freq: Every day | ORAL | Status: DC

## 2014-11-18 MED ORDER — ASPIRIN 81 MG PO CHEW
81.0000 mg | CHEWABLE_TABLET | ORAL | Status: AC
  Administered 2014-11-19: 81 mg via ORAL
  Filled 2014-11-18: qty 1

## 2014-11-18 MED ORDER — SODIUM CHLORIDE 0.9 % IV SOLN
Freq: Once | INTRAVENOUS | Status: AC
  Administered 2014-11-18: 21:00:00 via INTRAVENOUS

## 2014-11-18 MED ORDER — ASPIRIN 81 MG PO TABS
81.0000 mg | ORAL_TABLET | Freq: Every day | ORAL | Status: DC
  Administered 2014-11-20 – 2014-11-21 (×2): 81 mg via ORAL
  Filled 2014-11-18 (×5): qty 1

## 2014-11-18 MED ORDER — CARVEDILOL 3.125 MG PO TABS
3.1250 mg | ORAL_TABLET | Freq: Two times a day (BID) | ORAL | Status: DC
  Administered 2014-11-18: 3.125 mg via ORAL
  Filled 2014-11-18 (×4): qty 1

## 2014-11-18 MED ORDER — NITROGLYCERIN 0.4 MG SL SUBL: 0.4000 mg | SUBLINGUAL_TABLET | SUBLINGUAL | Status: DC | PRN

## 2014-11-18 MED ORDER — SODIUM CHLORIDE 0.9 % IJ SOLN: 3.0000 mL | INTRAMUSCULAR | Status: DC | PRN

## 2014-11-18 NOTE — Telephone Encounter (Signed)
Returned call to patient's daughter Juliann Pulse.Dr.Jordan advised with drop in B/P and worsening symptoms he should be admitted to hospital.Daughter stated she just spoke to father he feels slightly better but still weak.She wanted to ask Dr.Jordan since he feels slightly better should he wait before he starts carvedilol and being admitted.Advised I will speak to Dr.Jordan when he gets to office this afternoon.

## 2014-11-18 NOTE — Telephone Encounter (Signed)
Received a call from patient's daughter Juliann Pulse.She stated father is much worse.Stated he is so weak he can hardly walk across room.B/P low today 99/64,115/82.Echo results given.Daughter is concerned about starting carvedilol 6.25 mg twice a day and lisinopril 2.5 mg daily since B/P low.She stated she don't think father can wait to appointment with Dr.Jordan 12/03/14.Stated father will not tell us how bad he is really feeling.She wanted Dr.Jordan to know and what he recommended.Message sent to [place].

## 2014-11-18 NOTE — Telephone Encounter (Signed)
Returned call to patient's daughter Juliann Pulse 11/16/14.Daughter stated father is feeling worse since he started on atenolol.Stated he has no energy,lying on couch all day.B/P low 70/50,changed batteries in B/P machine B/P 104/86,105/70.Advised to hold atenolol.Keep echo appointment 11/17/14.Will speak to Dr.Jordan when he is back in office 11/18/14.Advised to call sooner if needed.  Spoke to pt this morning 7/21 echo results given.Dr.Jordan advised to stop atenolol.Start Carvedilol 6.25 mg twice a day and Lisinopril 2.5 mg daily.2 week appointment scheduled with Dr.Jordan 12/03/14 at 11:45 am.

## 2014-11-18 NOTE — H&P (Signed)
Patient ID: Gary Gonzalez MRN: [id number], DOB/AGE: 1927-03-06   Admit date: 11/18/2014   Primary Physician: [name], MD Primary Cardiologist: Dr. [name]  Pt. Profile:  pleasant 81 year old Caucasian male with past medical history of CAD s/p 2v CABG (seq LIMA to Diag and LAD, SVG to OM) on 05/15/2001, HLD, hypothyroidism, polymyalgia rheumatica, and chronic LBBB has echo shows worsening DOE and fatigue. Echo showed moderate drop in EF. Admitted for cath.   Problem List  Past Medical History  Diagnosis Date  . Hyperlipidemia   . Melanoma   . Coronary artery disease     two vessel disease with CABG  . Hypothyroidism   . PMR (polymyalgia rheumatica)   . Dizzy     Past Surgical History  Procedure Laterality Date  . Coronary artery bypass graft  05/15/2001    x3 sequential LIMA to diag and LAD, SVG to OM   . Cardiac catheterization  04/28/2001  . Melanoma excision  2001    off of back     Allergies  No Known Allergies  HPI  Patient is a pleasant 81 year old Caucasian male with past medical history of CAD s/p 2v CABG (seq LIMA to Diag and LAD, SVG to OM) on 05/15/2001, HLD, hypothyroidism, polymyalgia rheumatica, and chronic LBBB. According to the patient, workup that led to the previous CABG was syncope. He never had any chest pain prior to the previous bypass surgery. His last Myoview in December 2012 showed no perfusion abnormality. Echocardiogram obtained on 04/18/2011 showed EF 15-40%, grade 1 diastolic dysfunction, mild hypokinesis of the entire inferior and basal mid inferolateral myocardium, mild MR.   Since then, patient has been doing well until 3-4 weeks ago. He has been previously active and workout several days a week. However for the past 3-4 weeks, he has been noticing gradual onset of exertional dyspnea. He denies any chest discomfort or dizziness. He denies any shortness of breath at rest, lower extremity edema, orthopnea or paroxysmal nocturnal dyspnea. He  was seen by Dr. [name] on 11/11/2014. It was noted, he did have previous intolerance of ACE inhibitor and beta blocker in the past due to dizziness. He was also recently started on Repatha in March by his PCP for his cholesterol. It was noted patient had significant increasing ventricular ectopy with repetitive PVC triplet. It was felt his symptom is likely related to increased ectopy. Since his symptoms did worse temporarily after starting Repatha, although it is unlikely that medication responsible for his symptom, Repatha was held until further evaluation can be done to assess the symptom. He was placed on Holter monitor which he has returned today on 7/21, result pending. Laboratory finding obtained on 7/14 was significant for creatinine of 1.28, hemoglobin 11.5, TSH 4.887 which was mildly high. Echocardiogram was obtained on 7/20 showed EF 25-30%, suboptimal image quality, diffuse hypokinesis worse in the inferior wall, mild to moderate MR. He was started on atenolol given PVCs, however he was severely fatigued after starting on atenolol. Based on the echocardiogram on 7/20, he was instructed to stop atenolol and switched to Coreg and lisinopril.   Patient was admitted directly to [facility] on 7/21 to evaluate his drop in EF and monitor for signs of hypotension after starting Coreg and lisinopril.   Home Medications  Prior to Admission medications   Medication Sig Start Date End Date Taking? Authorizing Provider  aspirin 81 MG tablet Take 81 mg by mouth daily.    Historical Provider, MD  atenolol (  TENORMIN) 25 MG tablet Take 25 mg by mouth daily. 11/11/14   Historical Provider, MD  carvedilol (COREG) 6.25 MG tablet Take 1/2 tablet ( 3.125 mg ) twice a day 11/18/14   [name] [name], MD  levothyroxine (SYNTHROID, LEVOTHROID) 75 MCG tablet Take 75 mcg by mouth daily. 11/16/14   Historical Provider, MD  lisinopril (PRINIVIL,ZESTRIL) 2.5 MG tablet Take 1 tablet (2.5 mg total) by mouth daily.  11/18/14   [name] [name], MD  Nystatin [facility]) 100000 UNIT/GM POWD Apply 1 application topically as needed. 06/03/14   Historical Provider, MD  rosuvastatin (CRESTOR) 10 MG tablet Take 10 mg by mouth daily.    Historical Provider, MD  SYNTHROID 50 MCG tablet Take 50 mcg by mouth daily.  03/20/11   Historical Provider, MD    Family History  Family History  Problem Relation Age of Onset  . Liver cancer Mother   . Prostate cancer Father   . Hyperlipidemia Son   . Hyperlipidemia Daughter     Social History  History   Social History  . Marital Status: Married    Spouse Name: N/A  . Number of Children: N/A  . Years of Education: N/A   Occupational History  . Not on file.   Social History Main Topics  . Smoking status: Never Smoker   . Smokeless tobacco: Never Used  . Alcohol Use: No  . Drug Use: No  . Sexual Activity: Not on file   Other Topics Concern  . Not on file   Social History Narrative     Review of Systems General:  No chills, fever, night sweats or weight changes.  Cardiovascular:  No chest pain, edema, orthopnea, palpitations, paroxysmal nocturnal dyspnea. + dyspnea on exertion Dermatological: No rash, lesions/masses Respiratory: No cough, dyspnea Urologic: No hematuria, dysuria Abdominal:   No nausea, vomiting, diarrhea, bright red blood per rectum, melena, or hematemesis Neurologic:  No visual changes, changes in mental status. +weakness All other systems reviewed and are otherwise negative except as noted above.  Physical Exam  Blood pressure 121/41, pulse 80, temperature 97.5 F (36.4 C), temperature source Oral, resp. rate 18, height 6' (1.829 m), weight 147 lb 4.3 oz (66.8 kg), SpO2 100 %.  General: Pleasant, NAD Psych: Normal affect. Neuro: Alert and oriented X 3. Moves all extremities spontaneously. HEENT: Normal  Neck: Supple without bruits or JVD. Lungs:  Resp regular and unlabored, CTA. Heart: regularly irregular. no s3, s4, or  murmurs. Abdomen: Soft, non-tender, non-distended, BS + x 4.  Extremities: No clubbing, cyanosis or edema. DP/PT/Radials 2+ and equal bilaterally.  Labs  Troponin (Point of Care Test) No results for input(s): TROPIPOC in the last 72 hours. No results for input(s): CKTOTAL, CKMB, TROPONINI in the last 72 hours. Lab Results  Component Value Date   WBC 6.6 11/11/2014   HGB 11.5* 11/11/2014   HCT 36.4* 11/11/2014   MCV 92.2 11/11/2014   PLT 267 11/11/2014     Radiology/Studies  No results found.  ECG  Pending  Echocardiogram 11/17/2014  LV EF: 25% -  30%  ------------------------------------------------------------------- Indications:   (I25.810).  ------------------------------------------------------------------- History:  PMH: PVC. NSVT. Fatigue. Acquired from the patient and from the patient&'s chart. Atrial fibrillation. Coronary artery disease.  ------------------------------------------------------------------- Study Conclusions  - Procedure narrative: Transthoracic echocardiography for left ventricular function evaluation. Image quality was suboptimal. The study was technically difficult. - Left ventricle: Diffuse hypokinesis worse in the inferior wall. The cavity size was moderately dilated. Wall thickness was normal. Systolic  function was severely reduced. The estimated ejection fraction was in the range of 25% to 30%. - Mitral valve: There was mild to moderate regurgitation. - Left atrium: The atrium was moderately dilated. - Right atrium: The atrium was mildly dilated. - Atrial septum: No defect or patent foramen ovale was identified.    ASSESSMENT AND PLAN  1. Exertional dyspnea for 3-4 wks with new drop in LV function  - Echo 04/18/2011 EF 36-62%, grade 1 diastolic dysfunction, mild hypokinesis of the entire inferior and basal mid inferolateral myocardium, mild MR.   - Echo 11/17/2014 EF 25-30%, septal optimal image quality, diffuse  hypokinesis worse in the inferior wall, mild to moderate MR  - F/U on holter monitor result. Plan for L&RHC tomorrow to assess the EF drop  - Risk and benefit of procedure explained to the patient who display clear understanding and agree to proceed. Discussed with patient possible procedural risk include bleeding, vascular injury, renal injury, arrythmia, MI, stroke and loss of limb or life.  - hold ACEI pending cath  2. Fatigue/Weakness: maybe related to atenolol vs LV dysfunction vs frequent PVCs.   - did have a previous h/o intolerance to BB and ACEI due to dizziness  - Will start on coreg and lisinopril, need to monitor BP and recurrence of fatigue. If sx recur, may need to stop BB. Would likely to keep BB given the frequent PVCs  3. Frequent PVCs: pending holter monitor, was returned to the office on 7/21, pending result  4. CAD s/p 2v CABG (seq LIMA to Diag and LAD, SVG to OM) on 05/15/2001  5. HLD  6. Hypothyroidism  - last TSH 4.887 on 7/14, PCP just increased his synthroid to 51mcg today  7. polymyalgia rheumatica  8. chronic LBBB  9. Full Code Status: he was previously DNR, but on further discussing it seem he still want everything, he just does not want to be intubated for prolonged period. Made Full Code. Discussed advanced directive.  Hilbert Corrigan, PA-C 11/18/2014, 4:35 PM

## 2014-11-18 NOTE — Addendum Note (Signed)
Addended by: Golden Hurter D on: 11/18/2014 03:17 PM   Modules accepted: Medications

## 2014-11-18 NOTE — Telephone Encounter (Signed)
Spoke to [place] he returned phone call to patient's daughter Juliann Pulse.She will speak to father and call back about being admitted to hospital.   Received call from daughter Juliann Pulse.She stated father would like to know if he needs to have a stent what would Dr.Jordan recommend.Dr.Jordan advised to have a stent if indicated.   Spoke to patient he stated he will go to hospital this afternoon.Bed Control called.Patient instructed to go to [facility] admitting a tele bed is available.

## 2014-11-18 NOTE — Telephone Encounter (Signed)
I think with his drop in BP and worsening symptoms he should be admitted to the hospital for further evaluation. We could arrange direct admit to telemetry.  Janeal Abadi [name] MD, [facility]

## 2014-11-19 ENCOUNTER — Encounter (HOSPITAL_COMMUNITY)
Admission: AD | Disposition: A | Discharge status: Home / Self Care | Payer: Self-pay | Source: Ambulatory Visit | Attending: Cardiology

## 2014-11-19 ENCOUNTER — Other Ambulatory Visit: Payer: Self-pay

## 2014-11-19 DIAGNOSIS — I519 Heart disease, unspecified: Secondary | ICD-10-CM | POA: Diagnosis not present

## 2014-11-19 DIAGNOSIS — I2510 Atherosclerotic heart disease of native coronary artery without angina pectoris: Secondary | ICD-10-CM | POA: Diagnosis not present

## 2014-11-19 DIAGNOSIS — N183 Chronic kidney disease, stage 3 (moderate): Secondary | ICD-10-CM | POA: Diagnosis not present

## 2014-11-19 DIAGNOSIS — I472 Ventricular tachycardia: Principal | ICD-10-CM

## 2014-11-19 HISTORY — PX: CARDIAC CATHETERIZATION: SHX172

## 2014-11-19 LAB — CBC
HCT: 34.2 % — ABNORMAL LOW (ref 39.0–52.0)
Hemoglobin: 11.1 g/dL — ABNORMAL LOW (ref 13.0–17.0)
MCH: 29.4 pg (ref 26.0–34.0)
MCHC: 32.5 g/dL (ref 30.0–36.0)
MCV: 90.7 fL (ref 78.0–100.0)
Platelets: 209 10*3/uL (ref 150–400)
RBC: 3.77 MIL/uL — ABNORMAL LOW (ref 4.22–5.81)
RDW: 13.5 % (ref 11.5–15.5)
WBC: 8 10*3/uL (ref 4.0–10.5)

## 2014-11-19 LAB — PROTIME-INR
INR: 1.28 (ref 0.00–1.49)
Prothrombin Time: 16.1 seconds — ABNORMAL HIGH (ref 11.6–15.2)

## 2014-11-19 LAB — POCT I-STAT 3, VENOUS BLOOD GAS (G3P V)
Acid-base deficit: 4 mmol/L — ABNORMAL HIGH (ref 0.0–2.0)
Acid-base deficit: 5 mmol/L — ABNORMAL HIGH (ref 0.0–2.0)
Bicarbonate: 19.6 mEq/L — ABNORMAL LOW (ref 20.0–24.0)
Bicarbonate: 20.4 mEq/L (ref 20.0–24.0)
O2 Saturation: 42 %
O2 Saturation: 45 %
TCO2: 21 mmol/L (ref 0–100)
TCO2: 21 mmol/L (ref 0–100)
pCO2, Ven: 32.6 mmHg — ABNORMAL LOW (ref 45.0–50.0)
pCO2, Ven: 33.5 mmHg — ABNORMAL LOW (ref 45.0–50.0)
pH, Ven: 7.388 — ABNORMAL HIGH (ref 7.250–7.300)
pH, Ven: 7.392 — ABNORMAL HIGH (ref 7.250–7.300)
pO2, Ven: 24 mmHg — CL (ref 30.0–45.0)
pO2, Ven: 25 mmHg — CL (ref 30.0–45.0)

## 2014-11-19 LAB — BASIC METABOLIC PANEL
Anion gap: 10 (ref 5–15)
BUN: 26 mg/dL — ABNORMAL HIGH (ref 6–20)
CO2: 23 mmol/L (ref 22–32)
Calcium: 8.5 mg/dL — ABNORMAL LOW (ref 8.9–10.3)
Chloride: 111 mmol/L (ref 101–111)
Creatinine, Ser: 1.54 mg/dL — ABNORMAL HIGH (ref 0.61–1.24)
GFR calc Af Amer: 45 mL/min — ABNORMAL LOW (ref >60)
GFR calc non Af Amer: 39 mL/min — ABNORMAL LOW (ref >60)
Glucose, Bld: 81 mg/dL (ref 65–99)
Potassium: 4.1 mmol/L (ref 3.5–5.1)
Sodium: 144 mmol/L (ref 135–145)

## 2014-11-19 LAB — POCT I-STAT 3, ART BLOOD GAS (G3+)
Acid-base deficit: 5 mmol/L — ABNORMAL HIGH (ref 0.0–2.0)
Bicarbonate: 18.3 mEq/L — ABNORMAL LOW (ref 20.0–24.0)
O2 Saturation: 91 %
TCO2: 19 mmol/L (ref 0–100)
pCO2 arterial: 28.9 mmHg — ABNORMAL LOW (ref 35.0–45.0)
pH, Arterial: 7.409 (ref 7.350–7.450)
pO2, Arterial: 60 mmHg — ABNORMAL LOW (ref 80.0–100.0)

## 2014-11-19 LAB — CREATININE, SERUM
Creatinine, Ser: 1.37 mg/dL — ABNORMAL HIGH (ref 0.61–1.24)
GFR calc Af Amer: 52 mL/min — ABNORMAL LOW (ref >60)
GFR calc non Af Amer: 45 mL/min — ABNORMAL LOW (ref >60)

## 2014-11-19 SURGERY — RIGHT/LEFT HEART CATH AND CORONARY/GRAFT ANGIOGRAPHY

## 2014-11-19 MED ORDER — HYDRALAZINE HCL 20 MG/ML IJ SOLN
INTRAMUSCULAR | Status: DC | PRN
  Administered 2014-11-19: 10 mg via INTRAVENOUS

## 2014-11-19 MED ORDER — LIDOCAINE HCL (PF) 1 % IJ SOLN
INTRAMUSCULAR | Status: DC | PRN
  Administered 2014-11-19: 15 mL

## 2014-11-19 MED ORDER — MIDAZOLAM HCL 2 MG/2ML IJ SOLN
INTRAMUSCULAR | Status: AC
  Filled 2014-11-19: qty 2

## 2014-11-19 MED ORDER — SODIUM CHLORIDE 0.9 % IJ SOLN: 3.0000 mL | Freq: Two times a day (BID) | INTRAMUSCULAR | Status: DC

## 2014-11-19 MED ORDER — CARVEDILOL 6.25 MG PO TABS
6.2500 mg | ORAL_TABLET | Freq: Two times a day (BID) | ORAL | Status: DC
  Administered 2014-11-19: 6.25 mg via ORAL
  Filled 2014-11-19 (×5): qty 1

## 2014-11-19 MED ORDER — SODIUM CHLORIDE 0.9 % IV SOLN: 250.0000 mL | INTRAVENOUS | Status: DC | PRN

## 2014-11-19 MED ORDER — ACETAMINOPHEN 325 MG PO TABS: 650.0000 mg | ORAL_TABLET | ORAL | Status: DC | PRN

## 2014-11-19 MED ORDER — SODIUM CHLORIDE 0.9 % WEIGHT BASED INFUSION: 1.0000 mL/kg/h | INTRAVENOUS | Status: DC

## 2014-11-19 MED ORDER — NITROGLYCERIN 1 MG/10 ML FOR IR/CATH LAB
INTRA_ARTERIAL | Status: AC
  Filled 2014-11-19: qty 10

## 2014-11-19 MED ORDER — FENTANYL CITRATE (PF) 100 MCG/2ML IJ SOLN
INTRAMUSCULAR | Status: DC | PRN
  Administered 2014-11-19: 25 ug via INTRAVENOUS

## 2014-11-19 MED ORDER — SODIUM CHLORIDE 0.9 % WEIGHT BASED INFUSION: 3.0000 mL/kg/h | INTRAVENOUS | Status: DC

## 2014-11-19 MED ORDER — SODIUM CHLORIDE 0.9 % IJ SOLN
3.0000 mL | INTRAMUSCULAR | Status: DC | PRN
Start: 2014-11-19 — End: 2014-11-19

## 2014-11-19 MED ORDER — SODIUM CHLORIDE 0.9 % IJ SOLN: 3.0000 mL | INTRAMUSCULAR | Status: DC | PRN

## 2014-11-19 MED ORDER — LIDOCAINE HCL (PF) 1 % IJ SOLN
INTRAMUSCULAR | Status: AC
  Filled 2014-11-19: qty 30

## 2014-11-19 MED ORDER — ONDANSETRON HCL 4 MG/2ML IJ SOLN: 4.0000 mg | Freq: Four times a day (QID) | INTRAMUSCULAR | Status: DC | PRN

## 2014-11-19 MED ORDER — HEPARIN SODIUM (PORCINE) 5000 UNIT/ML IJ SOLN
5000.0000 [IU] | Freq: Three times a day (TID) | INTRAMUSCULAR | Status: DC
  Filled 2014-11-19 (×9): qty 1

## 2014-11-19 MED ORDER — SODIUM CHLORIDE 0.9 % IJ SOLN
3.0000 mL | Freq: Two times a day (BID) | INTRAMUSCULAR | Status: DC
  Administered 2014-11-21: 3 mL via INTRAVENOUS

## 2014-11-19 MED ORDER — SODIUM CHLORIDE 0.9 % IV SOLN: INTRAVENOUS | Status: AC

## 2014-11-19 MED ORDER — IOHEXOL 350 MG/ML SOLN
INTRAVENOUS | Status: DC | PRN
  Administered 2014-11-19: 45 mL via INTRAVENOUS

## 2014-11-19 MED ORDER — HYDRALAZINE HCL 20 MG/ML IJ SOLN
INTRAMUSCULAR | Status: AC
  Filled 2014-11-19: qty 1

## 2014-11-19 MED ORDER — MIDAZOLAM HCL 2 MG/2ML IJ SOLN
INTRAMUSCULAR | Status: DC | PRN
  Administered 2014-11-19: 1 mg via INTRAVENOUS

## 2014-11-19 MED ORDER — FENTANYL CITRATE (PF) 100 MCG/2ML IJ SOLN
INTRAMUSCULAR | Status: AC
  Filled 2014-11-19: qty 2

## 2014-11-19 SURGICAL SUPPLY — 11 items
CATH INFINITI 5 FR IM (CATHETERS) ×1 IMPLANT
CATH INFINITI 5FR MULTPACK ANG (CATHETERS) ×1 IMPLANT
CATH SWAN GANZ 7F STRAIGHT (CATHETERS) ×1 IMPLANT
KIT HEART LEFT (KITS) ×2 IMPLANT
PACK CARDIAC CATHETERIZATION (CUSTOM PROCEDURE TRAY) ×2 IMPLANT
SHEATH PINNACLE 5F 10CM (SHEATH) ×1 IMPLANT
SHEATH PINNACLE 7F 10CM (SHEATH) ×1 IMPLANT
TRANSDUCER W/STOPCOCK (MISCELLANEOUS) ×3 IMPLANT
WIRE EMERALD 3MM-J .025X260CM (WIRE) ×1 IMPLANT
WIRE EMERALD 3MM-J .035X150CM (WIRE) ×1 IMPLANT
WIRE EMERALD 3MM-J .035X260CM (WIRE) ×1 IMPLANT

## 2014-11-19 NOTE — Progress Notes (Signed)
I was told about this gut as i was walking out the buildihng but i cant see him tonight   GT is here tomorrow but assigned to [place] I would be glad to see him in the hospital for eval on MOnday  About 930 or so at shrot stay cneter

## 2014-11-19 NOTE — H&P (View-Only) (Signed)
TELEMETRY: Reviewed telemetry pt in NSR with very frequent PVCs with almost incessant couplets, triplets, and short runs of NSVT: Filed Vitals:   11/18/14 1553 11/18/14 2041 11/18/14 2107 11/19/14 0540  BP: 121/41  124/58 126/52  Pulse: 80 71  74  Temp: 97.5 F (36.4 C) 97.4 F (36.3 C)  97.7 F (36.5 C)  TempSrc: Oral Oral  Oral  Resp: 18 16  18   Height: 6' (1.829 m)   6' (1.829 m)  Weight: 66.8 kg (147 lb 4.3 oz)   66.2 kg (145 lb 15.1 oz)  SpO2: 100%   100%    Intake/Output Summary (Last 24 hours) at 11/19/14 0705 Last data filed at 11/19/14 0540  Gross per 24 hour  Intake    240 ml  Output   1525 ml  Net  -1285 ml   Filed Weights   11/18/14 1553 11/19/14 0540  Weight: 66.8 kg (147 lb 4.3 oz) 66.2 kg (145 lb 15.1 oz)    Subjective Feels fatigued. No chest pain. No dyspnea at rest.   . aspirin  81 mg Oral Daily  . carvedilol  6.25 mg Oral BID WC  . heparin  5,000 Units Subcutaneous 3 times per day  . levothyroxine  75 mcg Oral QAC breakfast  . rosuvastatin  10 mg Oral Daily  . sodium chloride  3 mL Intravenous Q12H      LABS: Basic Metabolic Panel:  Recent Labs  11/18/14 1917 11/19/14 0402  NA 140 144  K 4.6 4.1  CL 107 111  CO2 25 23  GLUCOSE 94 81  BUN 31* 26*  CREATININE 1.62* 1.54*  CALCIUM 8.7* 8.5*   Liver Function Tests: No results for input(s): AST, ALT, ALKPHOS, BILITOT, PROT, ALBUMIN in the last 72 hours. No results for input(s): LIPASE, AMYLASE in the last 72 hours. CBC:  Recent Labs  11/18/14 1917  WBC 8.5  HGB 11.2*  HCT 35.3*  MCV 91.2  PLT 211   Cardiac Enzymes: No results for input(s): CKTOTAL, CKMB, CKMBINDEX, TROPONINI in the last 72 hours. BNP: No results for input(s): PROBNP in the last 72 hours. D-Dimer: No results for input(s): DDIMER in the last 72 hours. Hemoglobin A1C: No results for input(s): HGBA1C in the last 72 hours. Fasting Lipid Panel: No results for input(s): CHOL, HDL, LDLCALC, TRIG, CHOLHDL,  LDLDIRECT in the last 72 hours. Thyroid Function Tests: No results for input(s): TSH, T4TOTAL, T3FREE, THYROIDAB in the last 72 hours.  Invalid input(s): FREET3   Radiology/Studies:  No results found.  PHYSICAL EXAM General: Elderly, thin,  in no acute distress. Head: Normocephalic, atraumatic, sclera non-icteric, oropharynx is clear Neck: Negative for carotid bruits. JVD not elevated. No adenopathy Lungs: Clear bilaterally to auscultation without wheezes, rales, or rhonchi. Breathing is unlabored. Heart: IRRR S1 S2 without murmurs, rubs, or gallops.  Abdomen: Soft, non-tender, non-distended with normoactive bowel sounds. No hepatomegaly. No rebound/guarding. No obvious abdominal masses. Msk:  Strength and tone appears normal for age. Extremities: No clubbing, cyanosis or edema.  Distal pedal pulses are 2+ and equal bilaterally. Neuro: Alert and oriented X 3. Moves all extremities spontaneously. Psych:  Responds to questions appropriately with a normal affect.  ASSESSMENT AND PLAN: 1. Acute systolic CHF with predominantly low output symptoms of fatigue, DOE and dizziness. EF 25-30% which is new since 2012. Need to rule out ischemia as a cause with prior CABG. LV dysfunction may be related to high PVC burden. Will initiate Coreg. (did not tolerate atenolol 25  mg daily). Observe BP and rhythm as medication adjusted.  2. CAD s/p CABG in 2002 by Dr. Cyndia Bent with sequential LIMA to LAD and diagonal, SVG to OM. Plan cardiac cath today. Will hydrate prior to procedure. Also do right heart to assess pulmonary pressures. No LV gram with renal insufficiency.  3. Ventricular ectopy. Holter pending but telemetry here shows incessant PVCs, couplets, triplets and short runs of NSVT with high burden. Will need EP to see for recommendations on treatment. High ectopy burden may be contributing to low EF and symptoms.  4. Acute renal insufficiency. He has not yet taken ACEi or diuretics. Suspect more  related to cardiorenal syndrome. Creatinine better today. Will hydrate for cath.   Present on Admission:  . LV dysfunction . Acute combined systolic and diastolic heart failure . S/P CABG x 2 . Chronic kidney disease (CKD) . PVC's (premature ventricular contractions) . Fatigue . CAD (coronary artery disease)  Signed, Mckinsley Koelzer [name], [place] 11/19/2014 7:05 AM

## 2014-11-19 NOTE — Progress Notes (Signed)
Site area: rt groin femoral arterial and venous sheath Site Prior to Removal:  Level 0 Pressure Applied For:  25 minutes Manual:   yes Patient Status During Pull:  stable Post Pull Site:  Level  0 Post Pull Instructions Given:  yes Post Pull Pulses Present: yes Dressing Applied:  tegaderm Bedrest begins @  3143 Comments:  0. Dr. Caryl Comes in to talk w/patient during sheath pull.

## 2014-11-19 NOTE — Progress Notes (Signed)
11/19/2014 1030 Nursing note Received call from central telemetry that although telemetry strip noting HR of 74, patient continues to have frequent couplet PVC as well as bigeminy and trigeminy PVC. When actual QRS complexes are counted out, HR only ranging between 30-40. RN immediately to bedside to assess patient again. Pt. Sitting in bed, awake, alert and happily joking with a visitor at beside in no acute distress. BP obtained 135/37. Radial pulse palpated and confirmed only 30 perfused BPM. Dr. [name] paged and made aware of findings. No new orders at this time. EP to see patient. Will continue to closely monitor patient.  Gary Gonzalez, Arville Lime

## 2014-11-19 NOTE — Progress Notes (Signed)
11/19/2014 1400 Contacted Ellen Henri PAC that pt. Was needing to be seen by EP per MD notes this am. PA to speak with MD regarding pt.  Insiya Oshea, Arville Lime

## 2014-11-19 NOTE — Interval H&P Note (Signed)
Cath Lab Visit (complete for each Cath Lab visit)  Clinical Evaluation Leading to the Procedure:   ACS: No.  Non-ACS:    Anginal Classification: CCS III  Anti-ischemic medical therapy: Minimal Therapy (1 class of medications)  Non-Invasive Test Results: No non-invasive testing performed  Prior CABG: No previous CABG   AUC attempted, but cannot be determined.   History and Physical Interval Note:  11/19/2014 4:48 PM  Gary Gonzalez  has presented today for surgery, with the diagnosis of Chest Pain/SOB  The various methods of treatment have been discussed with the patient and family. After consideration of risks, benefits and other options for treatment, the patient has consented to  Procedure(s): Right/Left Heart Cath and Coronary/Graft Angiography (N/A) as a surgical intervention .  The patient's history has been reviewed, patient examined, no change in status, stable for surgery.  I have reviewed the patient's chart and labs.  Questions were answered to the patient's satisfaction.     Gary Pry S.

## 2014-11-19 NOTE — Progress Notes (Signed)
TELEMETRY: Reviewed telemetry pt in NSR with very frequent PVCs with almost incessant couplets, triplets, and short runs of NSVT: Filed Vitals:   11/18/14 1553 11/18/14 2041 11/18/14 2107 11/19/14 0540  BP: 121/41  124/58 126/52  Pulse: 80 71  74  Temp: 97.5 F (36.4 C) 97.4 F (36.3 C)  97.7 F (36.5 C)  TempSrc: Oral Oral  Oral  Resp: 18 16  18   Height: 6' (1.829 m)   6' (1.829 m)  Weight: 66.8 kg (147 lb 4.3 oz)   66.2 kg (145 lb 15.1 oz)  SpO2: 100%   100%    Intake/Output Summary (Last 24 hours) at 11/19/14 0705 Last data filed at 11/19/14 0540  Gross per 24 hour  Intake    240 ml  Output   1525 ml  Net  -1285 ml   Filed Weights   11/18/14 1553 11/19/14 0540  Weight: 66.8 kg (147 lb 4.3 oz) 66.2 kg (145 lb 15.1 oz)    Subjective Feels fatigued. No chest pain. No dyspnea at rest.   . aspirin  81 mg Oral Daily  . carvedilol  6.25 mg Oral BID WC  . heparin  5,000 Units Subcutaneous 3 times per day  . levothyroxine  75 mcg Oral QAC breakfast  . rosuvastatin  10 mg Oral Daily  . sodium chloride  3 mL Intravenous Q12H      LABS: Basic Metabolic Panel:  Recent Labs  11/18/14 1917 11/19/14 0402  NA 140 144  K 4.6 4.1  CL 107 111  CO2 25 23  GLUCOSE 94 81  BUN 31* 26*  CREATININE 1.62* 1.54*  CALCIUM 8.7* 8.5*   Liver Function Tests: No results for input(s): AST, ALT, ALKPHOS, BILITOT, PROT, ALBUMIN in the last 72 hours. No results for input(s): LIPASE, AMYLASE in the last 72 hours. CBC:  Recent Labs  11/18/14 1917  WBC 8.5  HGB 11.2*  HCT 35.3*  MCV 91.2  PLT 211   Cardiac Enzymes: No results for input(s): CKTOTAL, CKMB, CKMBINDEX, TROPONINI in the last 72 hours. BNP: No results for input(s): PROBNP in the last 72 hours. D-Dimer: No results for input(s): DDIMER in the last 72 hours. Hemoglobin A1C: No results for input(s): HGBA1C in the last 72 hours. Fasting Lipid Panel: No results for input(s): CHOL, HDL, LDLCALC, TRIG, CHOLHDL,  LDLDIRECT in the last 72 hours. Thyroid Function Tests: No results for input(s): TSH, T4TOTAL, T3FREE, THYROIDAB in the last 72 hours.  Invalid input(s): FREET3   Radiology/Studies:  No results found.  PHYSICAL EXAM General: Elderly, thin,  in no acute distress. Head: Normocephalic, atraumatic, sclera non-icteric, oropharynx is clear Neck: Negative for carotid bruits. JVD not elevated. No adenopathy Lungs: Clear bilaterally to auscultation without wheezes, rales, or rhonchi. Breathing is unlabored. Heart: IRRR S1 S2 without murmurs, rubs, or gallops.  Abdomen: Soft, non-tender, non-distended with normoactive bowel sounds. No hepatomegaly. No rebound/guarding. No obvious abdominal masses. Msk:  Strength and tone appears normal for age. Extremities: No clubbing, cyanosis or edema.  Distal pedal pulses are 2+ and equal bilaterally. Neuro: Alert and oriented X 3. Moves all extremities spontaneously. Psych:  Responds to questions appropriately with a normal affect.  ASSESSMENT AND PLAN: 1. Acute systolic CHF with predominantly low output symptoms of fatigue, DOE and dizziness. EF 25-30% which is new since 2012. Need to rule out ischemia as a cause with prior CABG. LV dysfunction may be related to high PVC burden. Will initiate Coreg. (did not tolerate atenolol 25  mg daily). Observe BP and rhythm as medication adjusted.  2. CAD s/p CABG in 2002 by Dr. Cyndia Bent with sequential LIMA to LAD and diagonal, SVG to OM. Plan cardiac cath today. Will hydrate prior to procedure. Also do right heart to assess pulmonary pressures. No LV gram with renal insufficiency.  3. Ventricular ectopy. Holter pending but telemetry here shows incessant PVCs, couplets, triplets and short runs of NSVT with high burden. Will need EP to see for recommendations on treatment. High ectopy burden may be contributing to low EF and symptoms.  4. Acute renal insufficiency. He has not yet taken ACEi or diuretics. Suspect more  related to cardiorenal syndrome. Creatinine better today. Will hydrate for cath.   Present on Admission:  . LV dysfunction . Acute combined systolic and diastolic heart failure . S/P CABG x 2 . Chronic kidney disease (CKD) . PVC's (premature ventricular contractions) . Fatigue . CAD (coronary artery disease)  Signed, Fitzpatrick Alberico [name], [place] 11/19/2014 7:05 AM

## 2014-11-20 DIAGNOSIS — I472 Ventricular tachycardia: Secondary | ICD-10-CM | POA: Diagnosis present

## 2014-11-20 DIAGNOSIS — I493 Ventricular premature depolarization: Secondary | ICD-10-CM

## 2014-11-20 DIAGNOSIS — I495 Sick sinus syndrome: Secondary | ICD-10-CM

## 2014-11-20 DIAGNOSIS — I2510 Atherosclerotic heart disease of native coronary artery without angina pectoris: Secondary | ICD-10-CM | POA: Diagnosis present

## 2014-11-20 DIAGNOSIS — I340 Nonrheumatic mitral (valve) insufficiency: Secondary | ICD-10-CM | POA: Diagnosis present

## 2014-11-20 DIAGNOSIS — D649 Anemia, unspecified: Secondary | ICD-10-CM | POA: Diagnosis present

## 2014-11-20 DIAGNOSIS — Z7982 Long term (current) use of aspirin: Secondary | ICD-10-CM | POA: Diagnosis not present

## 2014-11-20 DIAGNOSIS — N183 Chronic kidney disease, stage 3 (moderate): Secondary | ICD-10-CM | POA: Diagnosis present

## 2014-11-20 DIAGNOSIS — Z888 Allergy status to other drugs, medicaments and biological substances status: Secondary | ICD-10-CM | POA: Diagnosis not present

## 2014-11-20 DIAGNOSIS — I252 Old myocardial infarction: Secondary | ICD-10-CM | POA: Diagnosis not present

## 2014-11-20 DIAGNOSIS — R0609 Other forms of dyspnea: Secondary | ICD-10-CM | POA: Diagnosis present

## 2014-11-20 DIAGNOSIS — Z951 Presence of aortocoronary bypass graft: Secondary | ICD-10-CM | POA: Diagnosis not present

## 2014-11-20 DIAGNOSIS — E785 Hyperlipidemia, unspecified: Secondary | ICD-10-CM | POA: Diagnosis present

## 2014-11-20 DIAGNOSIS — E039 Hypothyroidism, unspecified: Secondary | ICD-10-CM | POA: Diagnosis present

## 2014-11-20 DIAGNOSIS — M353 Polymyalgia rheumatica: Secondary | ICD-10-CM | POA: Diagnosis present

## 2014-11-20 DIAGNOSIS — I255 Ischemic cardiomyopathy: Secondary | ICD-10-CM

## 2014-11-20 DIAGNOSIS — I447 Left bundle-branch block, unspecified: Secondary | ICD-10-CM | POA: Diagnosis present

## 2014-11-20 DIAGNOSIS — I129 Hypertensive chronic kidney disease with stage 1 through stage 4 chronic kidney disease, or unspecified chronic kidney disease: Secondary | ICD-10-CM | POA: Diagnosis present

## 2014-11-20 DIAGNOSIS — I5043 Acute on chronic combined systolic (congestive) and diastolic (congestive) heart failure: Secondary | ICD-10-CM | POA: Diagnosis present

## 2014-11-20 MED ORDER — AMIODARONE HCL 200 MG PO TABS
200.0000 mg | ORAL_TABLET | Freq: Two times a day (BID) | ORAL | Status: DC
  Administered 2014-11-20 – 2014-11-22 (×3): 200 mg via ORAL
  Filled 2014-11-20 (×6): qty 1

## 2014-11-20 NOTE — Consult Note (Signed)
Reason for Consult:symptomatic dense ventricular ectopy  Referring Physician: Dr. [name]  Gary Gonzalez is an 81 y.o. male.   HPI: The patient is an 81 yo man with CAD, s/p MI, s/p CABG with previously nearly preserved LV function. He has had severe fatigue, weakness and sob and found to have worsening LV function and very dense ventricular ectopy. The patient was admitted and underwent cardiac catheterization and found to have patent grafts. He has been monitored on telemetry and has NSR with 2 PVC's continuously. He was tried previously on atenolol. Felt terrible. He denies periepheral edema. No frank syncope.   PMH: Past Medical History  Diagnosis Date  . Hyperlipidemia   . Coronary artery disease     two vessel disease with CABG  . Hypothyroidism   . PMR (polymyalgia rheumatica)   . Dizzy   . Exertional shortness of breath     "lately" (11/18/2014)  . Skipped heart beats   . Sleep apnea     "wife says I do" (11/18/2014)  . Anemia   . Arthritis     "touch in the fingers" (11/17/2014)  . Fibromyalgia     "a long time ago" (11/17/2014)  . History of gout   . Melanoma of back     PSHX: Past Surgical History  Procedure Laterality Date  . Coronary artery bypass graft  05/15/2001    x3 sequential LIMA to diag and LAD, SVG to OM   . Cardiac catheterization  04/28/2001  . Melanoma excision  2001    off of back  . Cataract extraction w/ intraocular lens  implant, bilateral Bilateral ~ 2010  . Axillary lymph node dissection Bilateral 2001    FAMHX: Family History  Problem Relation Age of Onset  . Liver cancer Mother   . Prostate cancer Father   . Hyperlipidemia Son   . Hyperlipidemia Daughter     Social History:  reports that he has never smoked. He has never used smokeless tobacco. He reports that he drinks about 3.6 oz of alcohol per week. He reports that he does not use illicit drugs.  Allergies:  Allergies  Allergen Reactions  . Atenolol Diarrhea and Other (See  Comments)    Very weak, fatigued, lethargic, no appetite    Medications: I have reviewed the patient's current medications.  No results found.  ROS  As stated in the HPI and negative for all other systems.  Physical Exam  Vitals:Blood pressure 112/48, pulse 77, temperature 98.1 F (36.7 C), temperature source Oral, resp. rate 20, height 6' (1.829 m), weight 147 lb 4.3 oz (66.8 kg), SpO2 97 %.  Elderly but well appearing 81 yo man, NAD HEENT: Unremarkable Neck:  7 JVD, no thyromegally Back:  No CVA tenderness Lungs:  Clear with no wheezes HEART:  IRegular rate rhythm, no murmurs, no rubs, no clicks Abd:  Flat, positive bowel sounds, no organomegally, no rebound, no guarding Ext:  2 plus pulses, no edema, no cyanosis, no clubbing Skin:  No rashes no nodules Neuro:  CN II through XII intact, motor grossly intact  ECG -NSR with PVC's in a couplet manner, continuously  Assessment/Plan: 1. Symptomatic PVC's 2. New LV dysfunction, likely related to the PVC's 3. Ischemic heart disease, s/p remote CABG 4. Sinus bradycardia Rec:I have discussed the problem and treatment options with the patient, his nurse daughter, and his son for nearly an hour. All questions answered. I would recommend initiation of amiodarone 200 mg twice daily as an inpatient, holding  all sinus nodal and av nodal blocking drugs, and follow the patient on telemetry. We also discussed the alternative of LV PVC ablation. At his advanced age and with his comorbidities, I would hope to avoid ablation. If his arrhythmias do not improve, ablation may be our only option. He will do very poorly if his ectopy is not abolished.  Carleene Overlie TaylorMD 11/20/2014, 10:10 AM

## 2014-11-21 MED ORDER — AMIODARONE HCL IN DEXTROSE 360-4.14 MG/200ML-% IV SOLN
60.0000 mg/h | INTRAVENOUS | Status: DC
  Administered 2014-11-21 (×2): 60 mg/h via INTRAVENOUS
  Filled 2014-11-21 (×5): qty 200

## 2014-11-21 NOTE — Plan of Care (Signed)
Problem: Phase II Progression Outcomes Goal: Barriers To Progression Addressed/Resolved Outcome: Completed/Met Date Met:  11/21/14 Due to low heart rate 30-40's.  Amiodarone drip initiated per MD order

## 2014-11-21 NOTE — Progress Notes (Signed)
Patient ID: ERAGON HAMMOND, male   DOB: 1926/12/20, 81 y.o.   MRN: [id number]    Patient Name: Gary Gonzalez Date of Encounter: 11/21/2014     Principal Problem:   Acute combined systolic and diastolic heart failure Active Problems:   CAD (coronary artery disease)   PVC's (premature ventricular contractions)   LV dysfunction   Fatigue   NSVT (nonsustained ventricular tachycardia)   S/P CABG x 2   Chronic kidney disease (CKD)    SUBJECTIVE  No nausea or vomiting or dizzy spells  CURRENT MEDS . amiodarone  200 mg Oral BID  . aspirin  81 mg Oral Daily  . heparin  5,000 Units Subcutaneous 3 times per day  . levothyroxine  75 mcg Oral QAC breakfast  . sodium chloride  3 mL Intravenous Q12H    OBJECTIVE  Filed Vitals:   11/20/14 1957 11/20/14 2252 11/21/14 0100 11/21/14 0414  BP: 149/92   133/44  Pulse: 46 42 38 25  Temp: 98.2 F (36.8 C)   98.6 F (37 C)  TempSrc: Oral   Oral  Resp: 16   16  Height:      Weight:    145 lb 4.5 oz (65.9 kg)  SpO2: 99%   95%    Intake/Output Summary (Last 24 hours) at 11/21/14 0824 Last data filed at 11/20/14 1252  Gross per 24 hour  Intake    480 ml  Output      0 ml  Net    480 ml   Filed Weights   11/19/14 0540 11/20/14 0455 11/21/14 0414  Weight: 145 lb 15.1 oz (66.2 kg) 147 lb 4.3 oz (66.8 kg) 145 lb 4.5 oz (65.9 kg)    PHYSICAL EXAM  General: Pleasant, elderly man, NAD. Neuro: Alert and oriented X 3. Moves all extremities spontaneously. Psych: Normal affect. HEENT:  Normal  Neck: Supple without bruits or JVD. Lungs:  Resp regular and unlabored, CTA. Heart: IRRR no s3, s4, or murmurs. Abdomen: Soft, non-tender, non-distended, BS + x 4.  Extremities: No clubbing, cyanosis or edema. DP/PT/Radials 2+ and equal bilaterally.  Accessory Clinical Findings  CBC  Recent Labs  11/18/14 1917 11/19/14 2130  WBC 8.5 8.0  HGB 11.2* 11.1*  HCT 35.3* 34.2*  MCV 91.2 90.7  PLT 211 269   Basic Metabolic Panel  Recent Labs  11/18/14 1917 11/19/14 0402 11/19/14 2130  NA 140 144  --   K 4.6 4.1  --   CL 107 111  --   CO2 25 23  --   GLUCOSE 94 81  --   BUN 31* 26*  --   CREATININE 1.62* 1.54* 1.37*  CALCIUM 8.7* 8.5*  --    Liver Function Tests No results for input(s): AST, ALT, ALKPHOS, BILITOT, PROT, ALBUMIN in the last 72 hours. No results for input(s): LIPASE, AMYLASE in the last 72 hours. Cardiac Enzymes No results for input(s): CKTOTAL, CKMB, CKMBINDEX, TROPONINI in the last 72 hours. BNP Invalid input(s): POCBNP D-Dimer No results for input(s): DDIMER in the last 72 hours. Hemoglobin A1C No results for input(s): HGBA1C in the last 72 hours. Fasting Lipid Panel No results for input(s): CHOL, HDL, LDLCALC, TRIG, CHOLHDL, LDLDIRECT in the last 72 hours. Thyroid Function Tests No results for input(s): TSH, T4TOTAL, T3FREE, THYROIDAB in the last 72 hours.  Invalid input(s): FREET3  TELE  nsr with pvc's,slightly better  Radiology/Studies  No results found.  ASSESSMENT AND PLAN  1. Dense ventricular ectopy 2. New  LV dysfunction 3. Ischemic heart disease, s/p cath with patent grafts 4. Advanced age Rec: his pvc's slightly better with initiation of amiodarone. He has had no nausea. I will add IV amio today, no bolus and plan for discharge home tomorrow. If blood pressure allows, would consider discharge with low dose ARB.  Gregg Taylor,M.D.  Gregg Taylor,M.D.  11/21/2014 8:24 AM

## 2014-11-22 ENCOUNTER — Encounter (HOSPITAL_COMMUNITY): Payer: Self-pay | Admitting: Interventional Cardiology

## 2014-11-22 MED ORDER — AMIODARONE HCL IN DEXTROSE 360-4.14 MG/200ML-% IV SOLN
30.0000 mg/h | INTRAVENOUS | Status: DC
  Filled 2014-11-22 (×2): qty 200

## 2014-11-22 MED ORDER — AMIODARONE HCL 200 MG PO TABS: 200.0000 mg | ORAL_TABLET | Freq: Two times a day (BID) | ORAL | Status: DC

## 2014-11-22 MED ORDER — AMIODARONE HCL IN DEXTROSE 360-4.14 MG/200ML-% IV SOLN
30.0000 mg/h | INTRAVENOUS | Status: DC
  Filled 2014-11-22: qty 200

## 2014-11-22 NOTE — Progress Notes (Signed)
Patient discharged at 1130 on 11/22/2014. Discharge education included follow up care, at home medications, and heart failure. Discussed with the patient and all questioned fully answered.   Fritz Pickerel RN

## 2014-11-22 NOTE — Progress Notes (Addendum)
Patient ID: Gary Gonzalez, male   DOB: October 25, 1926, 81 y.o.   MRN: [id number]    SUBJECTIVE: The patient is doing well today.  At this time, he denies chest pain, shortness of breath, or any new concerns.  Marland Kitchen amiodarone  200 mg Oral BID  . aspirin  81 mg Oral Daily  . heparin  5,000 Units Subcutaneous 3 times per day  . levothyroxine  75 mcg Oral QAC breakfast  . sodium chloride  3 mL Intravenous Q12H   . amiodarone 30 mg/hr (11/22/14 0348)    OBJECTIVE: Physical Exam: Filed Vitals:   11/21/14 2108 11/22/14 0300 11/22/14 0444 11/22/14 0500  BP: 144/42  123/66   Pulse: 105 36 41 38  Temp: 97.4 F (36.3 C)  97.8 F (36.6 C)   TempSrc: Oral  Oral   Resp: 18  18   Height:      Weight:      SpO2: 98%  98%     Intake/Output Summary (Last 24 hours) at 11/22/14 0824 Last data filed at 11/21/14 2300  Gross per 24 hour  Intake   1200 ml  Output   1150 ml  Net     50 ml    Telemetry reveals sinus rhythm with PVC's, now 2 normal beats and a single PVC in repetition.  GEN- The patient is well appearing, alert and oriented x 3 today.   Neck- supple, no JVP Lungs- Clear to ausculation bilaterally, normal work of breathing Heart- IRegular rate and rhythm, no murmurs, rubs or gallops, PMI not laterally displaced GI- soft, NT, ND, + BS Extremities- no clubbing, cyanosis, or edema Skin- no rash or lesion Psych- euthymic mood, full affect Neuro- strength and sensation are intact  LABS: Basic Metabolic Panel:  Recent Labs  11/19/14 2130  CREATININE 1.37*   Liver Function Tests: No results for input(s): AST, ALT, ALKPHOS, BILITOT, PROT, ALBUMIN in the last 72 hours. No results for input(s): LIPASE, AMYLASE in the last 72 hours. CBC:  Recent Labs  11/19/14 2130  WBC 8.0  HGB 11.1*  HCT 34.2*  MCV 90.7  PLT 209   Cardiac Enzymes: No results for input(s): CKTOTAL, CKMB, CKMBINDEX, TROPONINI in the last 72 hours. BNP: Invalid input(s): POCBNP D-Dimer: No results for  input(s): DDIMER in the last 72 hours. Hemoglobin A1C: No results for input(s): HGBA1C in the last 72 hours. Fasting Lipid Panel: No results for input(s): CHOL, HDL, LDLCALC, TRIG, CHOLHDL, LDLDIRECT in the last 72 hours. Thyroid Function Tests: No results for input(s): TSH, T4TOTAL, T3FREE, THYROIDAB in the last 72 hours.  Invalid input(s): FREET3 Anemia Panel: No results for input(s): VITAMINB12, FOLATE, FERRITIN, TIBC, IRON, RETICCTPCT in the last 72 hours.  RADIOLOGY: No results found.  ASSESSMENT AND PLAN:  Principal Problem:   Acute combined systolic and diastolic heart failure Active Problems:   CAD (coronary artery disease)   PVC's (premature ventricular contractions)   LV dysfunction   Fatigue   NSVT (nonsustained ventricular tachycardia)   S/P CABG x 2   Chronic kidney disease (CKD) Plan - ok to discharge home today. Will dc IV amio. I would like him to take amio 200 mg bid for a month. I would like him to return in a week for a 12 lead ECG. I would like him to wear a 24 hour holter in 4 weeks and I will see after that. Hold off on Losartan for now secondary to renal insufficiency. Hold off on beta blocker for now. He did  not tolerate this well in the past. I am concerned about bradycardia while on amio. My plan would be to start very low dose of both when he returns for followup. Also, he will need followup with Dr. Abner Greenspan to monitor his thyroid function on amio.  Cristopher Peru, MD 11/22/2014 8:24 AM

## 2014-11-22 NOTE — Progress Notes (Signed)
Patient Amiodarone gtt dosage decreased to 30 mg/hr per MD order.  Patient sleeping,call bell within reach.  RN will continue to monitor

## 2014-11-22 NOTE — Discharge Summary (Signed)
Discharge Summary   Patient ID: Gary Gonzalez MRN: [id number], DOB/AGE: 12/09/1926 81 y.o. Admit date: 11/18/2014 D/C date:     11/22/2014  Primary Care Provider: [name], MD Primary Cardiologist: Dr. Kris Gonzalez  Primary Discharge Diagnoses:  1. Dyspnea likely due to PVCs/NSVT 2. LV dysfunction EF 25-30% 3. CAD s/p 2V CABG 4. Chronic LBBB 5. Sinus bradycardia 6. CKD stage III  PMH:  Past Medical History  Diagnosis Date  . Hyperlipidemia   . Coronary artery disease     a. s/p 2v CABG (seq LIMA to Diag and LAD, SVG to OM) on 05/15/2001.  Marland Kitchen Hypothyroidism   . PMR (polymyalgia rheumatica)   . NSVT (nonsustained ventricular tachycardia)   . PVC's (premature ventricular contractions)   . Sleep apnea     "wife says I do" (11/18/2014)  . Anemia   . Arthritis     "touch in the fingers" (11/17/2014)  . Fibromyalgia     "a long time ago" (11/17/2014)  . History of gout   . Melanoma of back   . CKD (chronic kidney disease), stage III   . LBBB (left bundle branch block)   . Chronic combined systolic and diastolic CHF (congestive heart failure)     a. 11/17/14 showed EF 25-30%, suboptimal image quality, diffuse hypokinesis worse in the inferior wall, mild to moderate MR. New drop EF compared to 2012 but cath was stable.    Hospital Course: Gary Gonzalez is an 81 y/o M with history of past medical history of CAD s/p 2v CABG (seq LIMA to Diag and LAD, SVG to OM) on 05/15/2001, HLD, hypothyroidism, polymyalgia rheumatica, and chronic LBBB who was admitted to [facility] 11/18/2014 for further eval of PVCs and new low EF. According to the patient, workup that led to the previous CABG was syncope. He never had any chest pain prior to the previous bypass surgery. He has previous intolerance of ACE inhibitor and beta blocker in the past due to dizziness. Prior echo in 2012 showed EF 45-50%, but echo 11/17/14 showed EF 25-30%, suboptimal image quality, diffuse hypokinesis worse in the inferior wall, mild  to moderate MR.  He recently had been noticing worsening exertional dyspnea but no chest pain. He has not had any recent syncope. He was noted to have increased ventricular ectopy. The only recent change was that his PCP had recently started Repatha so this was held although was felt unlikely cause for his symptoms. He was placed on Holter monitor which he has returned on 7/21, result pending. Labs 7/14 were significant for creatinine of 1.28, hemoglobin 11.5, TSH 4.887 which was mildly high. 2D echo was done as noted above. He was started on atenolol given PVCs but this was stopped due to severe fatigue. Based on the echocardiogram on 7/20, he was instructed to stop atenolol and switched to Coreg and lisinopril. He was then admitted directly to [facility] on 7/21 to evaluate his drop in EF and monitor for signs of hypotension after starting Coreg and lisinopril. Levothyroxine was increased to 12mcg. Cardiac cath 7/22 showed patent grafts, LIMA to Diagonal and LAD; SVG to OM. Patent RCA. Normal Right heart pressures. Normal LVEDP. Unfortunately he was not felt to be able to tolerate beta blocker therapy. He was ultimately started on IV amiodarone with improvement in ectopy. He did have sinus bradycardia into the 30's overnight but was asymptomatic with this. IV amiodarone dose was decreased. Today it was changed to amiodarone 200mg  BID for 1 month -  dose will likely be reduced at f/u appt. Gary Gonzalez recommends to hold off ACEI/ARB right now due to renal insufficiency and hold on BB due to intolerance. They discussed PVC ablation but at his advanced age and comorbidities he hopes to avoid this. He recommends that the patient return in 1 week for a 12 lead EKG. Gary Gonzalez would like him to wear a 24-hour Holter in 4 weeks and he will see the patient after that. He is concerned about bradycardia while on amiodarone. The patient was also asked to f/u with Gary Gonzalez to follow thyroid function. Gary Gonzalez has  seen and examined the patient today and feels he is stable for discharge. I have sent a message to our EP scheduler requesting this follow-up plan, and our office will call the patient with this information.    Discharge Vitals: Blood pressure 123/66, pulse 38, temperature 97.8 F (36.6 C), temperature source Oral, resp. rate 18, height 6' (1.829 m), weight 145 lb 4.5 oz (65.9 kg), SpO2 98 %.  Labs: Lab Results  Component Value Date   WBC 8.0 11/19/2014   HGB 11.1* 11/19/2014   HCT 34.2* 11/19/2014   MCV 90.7 11/19/2014   PLT 209 11/19/2014    Recent Labs Lab 11/19/14 0402 11/19/14 2130  NA 144  --   K 4.1  --   CL 111  --   CO2 23  --   BUN 26*  --   CREATININE 1.54* 1.37*  CALCIUM 8.5*  --   GLUCOSE 81  --    Diagnostic Studies/Procedures   Cardiac catheterization this admission, please see full report and above for summary.   Discharge Medications   Current Discharge Medication List    START taking these medications   Details  amiodarone (PACERONE) 200 MG tablet Take 1 tablet (200 mg total) by mouth 2 (two) times daily. Qty: 60 tablet, Refills: 1      CONTINUE these medications which have NOT CHANGED   Details  aspirin 81 MG tablet Take 81 mg by mouth daily.    levothyroxine (SYNTHROID, LEVOTHROID) 75 MCG tablet Take 75 mcg by mouth daily.    rosuvastatin (CRESTOR) 10 MG tablet Take 10 mg by mouth daily.      STOP taking these medications     atenolol (TENORMIN) 25 MG tablet      carvedilol (COREG) 6.25 MG tablet      lisinopril (PRINIVIL,ZESTRIL) 2.5 MG tablet         Disposition   The patient will be discharged in stable condition to home. Discharge Instructions    Diet - low sodium heart healthy    Complete by:  As directed      Increase activity slowly    Complete by:  As directed   No lifting over 5 lbs for 1 week. No sexual activity for 1 week. Keep procedure site clean & dry. If you notice increased pain, swelling, bleeding or pus,  call/return!  You may shower, but no soaking baths/hot tubs/pools for 1 week.  Gary Gonzalez has started you on amiodarone twice a day. He will probably decrease this dose at your follow-up appointment. Do not take any carvedilol, atenolol, or lisinopril for now. You will return to the office for an EKG in a week. Gary Gonzalez would also like you to wear a 24-hour heart monitor in about a month, then see him in follow-up. The office will call you to arrange these things.  Follow-up Information    Follow up with Cristopher Peru, MD.   Specialty:  Cardiology   Why:  Office will call you for your followup appointments. You will need an EKG in a week. You will wear a heart monitor in about 4 weeks and then follow-up with Gary Gonzalez after that. Call office if you have not heard back in 3 days.   Contact information:   [address] [place] [address] [phone]       Follow up with Gary Ly, MD.   Specialty:  Internal Medicine   Why:  Please follow up with your primary care doctor to follow your thyroid function. You'll likely need this rechecked in 3-4 weeks.   Contact information:   [address] [place] [address] [phone]         Duration of Discharge Encounter: Greater than 30 minutes including physician and PA time.  Rudean Hitt, Dunn PA-C 11/22/2014, 9:22 AM  EP Attending  Patient seen and examined. Agree with the findings as noted above. Stable for discharge.   Mikle Bosworth.D.

## 2014-11-22 NOTE — Care Management Important Message (Signed)
Important Message  Patient Details  Name: Gary Gonzalez MRN: [id number] Date of Birth: 12-18-1926   Medicare Important Message Given:  Yes-third notification given    Maryclare Labrador, RN 11/22/2014, 10:23 AM

## 2014-11-22 NOTE — Care Management Important Message (Signed)
Important Message  Patient Details  Name: LEONCIO HANSEN MRN: [id number] Date of Birth: 08-Sep-1926   Medicare Important Message Given:  Yes-second notification given    Nathen May 11/22/2014, 2:17 [place] Message  Patient Details  Name: BURLON CENTRELLA MRN: [id number] Date of Birth: 21-Sep-1926   Medicare Important Message Given:  Yes-second notification given    Nathen May 11/22/2014, 2:17 PM

## 2014-11-22 NOTE — Progress Notes (Signed)
Patient manual pulse at 36, patient asymptomatic,resting comfortably.  MD paged, orders received.  RN will continue to monitor patient.

## 2014-11-23 ENCOUNTER — Telehealth: Payer: Self-pay | Admitting: Cardiology

## 2014-11-23 NOTE — Telephone Encounter (Signed)
Patient contacted regarding discharge from [facility] on 11/22/2014.  Patient understands to follow up with provider dr [name] on 12/03/2014 at 11:45 am at [facility]. Patient understands discharge instructions? yes  Patient understands medications and regiment? yes  Patient understands to bring all medications to this visit? yes   Spoke with patient and stated his appointment was supposed to be with Dr [name], not dr [name]. I told patient to call [employer] office and see if provider had any openings and if not to keep appointment with Dr [name]. Patient was informed about appointment with Dr [name] on 12/03/14 at 11:45am.

## 2014-11-23 NOTE — Telephone Encounter (Signed)
D/C phone call .Marland Kitchen appt on 12/03/14 @ 11:45am w/ Dr.Jordan .. thanks

## 2014-11-24 ENCOUNTER — Other Ambulatory Visit: Payer: Self-pay | Admitting: *Deleted

## 2014-11-24 DIAGNOSIS — I493 Ventricular premature depolarization: Secondary | ICD-10-CM

## 2014-12-03 ENCOUNTER — Ambulatory Visit: Payer: Medicare HMO | Admitting: Cardiology

## 2014-12-03 ENCOUNTER — Other Ambulatory Visit (INDEPENDENT_AMBULATORY_CARE_PROVIDER_SITE_OTHER): Payer: Medicare HMO | Admitting: *Deleted

## 2014-12-03 ENCOUNTER — Encounter: Payer: Self-pay | Admitting: Internal Medicine

## 2014-12-03 ENCOUNTER — Ambulatory Visit (INDEPENDENT_AMBULATORY_CARE_PROVIDER_SITE_OTHER): Payer: Medicare HMO

## 2014-12-03 VITALS — BP 134/66 | HR 64 | Ht 72.0 in | Wt 144.0 lb

## 2014-12-03 DIAGNOSIS — I472 Ventricular tachycardia: Secondary | ICD-10-CM | POA: Diagnosis not present

## 2014-12-03 DIAGNOSIS — Z79899 Other long term (current) drug therapy: Secondary | ICD-10-CM | POA: Diagnosis not present

## 2014-12-03 DIAGNOSIS — I4729 Other ventricular tachycardia: Secondary | ICD-10-CM

## 2014-12-03 DIAGNOSIS — E785 Hyperlipidemia, unspecified: Secondary | ICD-10-CM | POA: Diagnosis not present

## 2014-12-03 DIAGNOSIS — R9431 Abnormal electrocardiogram [ECG] [EKG]: Secondary | ICD-10-CM | POA: Diagnosis not present

## 2014-12-03 DIAGNOSIS — R42 Dizziness and giddiness: Secondary | ICD-10-CM | POA: Diagnosis not present

## 2014-12-03 LAB — BASIC METABOLIC PANEL
BUN: 27 mg/dL — ABNORMAL HIGH (ref 6–23)
CO2: 32 mEq/L (ref 19–32)
Calcium: 9.7 mg/dL (ref 8.4–10.5)
Chloride: 102 mEq/L (ref 96–112)
Creatinine, Ser: 1.4 mg/dL (ref 0.40–1.50)
GFR: 51.06 mL/min — ABNORMAL LOW (ref >60.00)
Glucose, Bld: 89 mg/dL (ref 70–99)
Potassium: 5.2 mEq/L — ABNORMAL HIGH (ref 3.5–5.1)
Sodium: 139 mEq/L (ref 135–145)

## 2014-12-03 LAB — BRAIN NATRIURETIC PEPTIDE: Pro B Natriuretic peptide (BNP): 361 pg/mL — ABNORMAL HIGH (ref 0.0–100.0)

## 2014-12-03 NOTE — Progress Notes (Signed)
1.) Reason for visit: EKG  2.) Name of MD requesting visit: [name]  3.) H&P: [name]  ROS related to problem: [name], [name], had BMET, T3, CBC with Diff, [name], [name]; [name], [name], [name]

## 2014-12-03 NOTE — Patient Instructions (Signed)
Medication Instructions:  Your physician has recommended you make the following change in your medication:  1) GO back to taking your Amiodarone 200 mg (1 tablet) tablet by mouth TWICE Daily    Labwork: NONE  Testing/Procedures: NONE  Follow-Up: Keep scheduled follow up appointment with Dr. [name] on 9/7 Keep schedule appointment for 24 hour Holter On 8/25   Any Other Special Instructions Will Be Listed Below (If Applicable).

## 2014-12-12 ENCOUNTER — Telehealth: Payer: Self-pay | Admitting: Cardiology

## 2014-12-12 NOTE — Telephone Encounter (Signed)
Daughter called, pt has nausea and anorexia he feels is secondary to Amiodarone.  I suggested they try 100 mg BID. They will call in a few days. If this doesn't help I suspect it will need to be stopped.  [name] PA-C 12/12/2014 9:08 AM

## 2014-12-13 ENCOUNTER — Telehealth: Payer: Self-pay | Admitting: Internal Medicine

## 2014-12-13 NOTE — Telephone Encounter (Signed)
Returned call to patient's daughter Juliann Pulse.She stated seems to be some confusion.Stated she spoke to a PA yesterday 12/12/14.He advised to decrease amiodarone to 100 mg twice a day.Stated father just spoke to Dr.Taylor's nurse Claiborne Billings and she advised him just to hold amiodarone since he feels so bad.Stated she was concerned #1 what is causing nausea #2 what can father take to help nausea.Stated father is right back to like he was previously feeling.Stated he is so weak he can hardly walk across room.Advised Dr.Jordan is out of office.I will send message to him for advice.

## 2014-12-13 NOTE — Telephone Encounter (Signed)
New message      Calling Claiborne Billings about extreme nausea.  Could it be from the amiodarone?

## 2014-12-13 NOTE — Telephone Encounter (Signed)
Pt daughter calling to change callback #. Please call back on [phone]

## 2014-12-13 NOTE — Telephone Encounter (Signed)
Spoke with patient and after reading Gary Gonzalez note looks like his Amiodarone was decreased yesterday to 100mg  twice daily and if the symptoms were not better in a few days to decrease again to 100mg  daily.  His daughter has called to day as his symptoms are not any better.  These symptoms have been going on for 1 weeks and get worse as the day goes on per patient.  I have explained to him that Amiodarone has a long half life and it may take longer that 24 hours to notice a difference in symptoms.  He is going to decrease his dose to 100mg  daily and I will follow up with him on Wed.

## 2014-12-14 NOTE — Telephone Encounter (Signed)
We should really try and reduce dose first as nausea may be dose related and alternative treatment for his arrhythmia is very limited.  Peter [name] MD, [facility]

## 2014-12-15 ENCOUNTER — Encounter: Payer: Self-pay | Admitting: Internal Medicine

## 2014-12-15 ENCOUNTER — Ambulatory Visit: Payer: Medicare HMO | Admitting: Cardiology

## 2014-12-15 NOTE — Telephone Encounter (Signed)
Spoke with daughter this morning  I let her know that I did not recommend her Dad stop his medication. I asked that he decrease his dose to 100mg  daily as it had only been a day since his daughter had spoken with [name], PA and was asked to decrease to 100 mg bid.  I also tried explaining to her that Amiodarone has a long 1/2 life and it may take some time for him to notice a difference.  She says he did not take any yesterday and had a great appetite.  I also explained to her what Dr [name] had said in that we don't have a lot of options for his irregular heartbeat.  They are seeing his PCP(Dr Perini--of whom she refers to as Elta Guadeloupe) today to discuss Appetite sitmulants.  I told her I would call back to day at the end of the day to discuss what Dr Joylene Draft has suggested

## 2014-12-15 NOTE — Telephone Encounter (Signed)
Call Documentation      Suan Halter at 12/15/2014 8:05 AM     Status: Signed       Expand All Collapse All   New message    Pt daughter returning your call

## 2014-12-15 NOTE — Telephone Encounter (Signed)
New message ° ° ° ° °Pt daughter returning your call °

## 2014-12-15 NOTE — Telephone Encounter (Signed)
Spoke with daughter Juliann Pulse.Dr.Jordan advised to try and decrease amiodarone dose first.She stated she has already spoke to Dr.Taylor's nurse Claiborne Billings this morning.Father has decreased amiodarone dose to 100 mg twice a day.Stated she just wanted to make sure Dr.Jordan was aware how severe his weakness was.Stated he has lost 10 lbs since discharged from hospital.Stated he has been so weak he stayed in bed,could hardly walk across room.Stated he has felt better the last 2 days.Has been able to eat.He has appointment with PCP today to discuss medication to help appetite.  Dr.Jordan was made aware of patient's weakness.Patient feeling better this morning.Advised to continue amiodarone 100 mg twice a day.Advised to keep appointment with Dr.Taylor 01/05/15 at 3:15 pm.Advised to call sooner if needed.

## 2014-12-15 NOTE — Telephone Encounter (Signed)
This encounter was created in error - please disregard.

## 2014-12-16 NOTE — Addendum Note (Signed)
Addended by: Janan Halter F on: 12/16/2014 02:11 PM   Modules accepted: Orders

## 2014-12-16 NOTE — Telephone Encounter (Addendum)
Spoke with wife and Dr Joylene Draft also suggested that he take 100mg  daily just as I have.  He gave him an apetite stimulant Remeron 15 mg nightly.  He says today is one of the best days he has had.  He will call me back if needed prior to his appointmnet

## 2014-12-23 ENCOUNTER — Ambulatory Visit (INDEPENDENT_AMBULATORY_CARE_PROVIDER_SITE_OTHER): Payer: Medicare HMO

## 2014-12-23 DIAGNOSIS — I493 Ventricular premature depolarization: Secondary | ICD-10-CM | POA: Diagnosis not present

## 2015-01-05 ENCOUNTER — Encounter: Payer: Self-pay | Admitting: *Deleted

## 2015-01-05 ENCOUNTER — Encounter: Payer: Self-pay | Admitting: Internal Medicine

## 2015-01-05 ENCOUNTER — Ambulatory Visit (INDEPENDENT_AMBULATORY_CARE_PROVIDER_SITE_OTHER): Payer: Medicare HMO | Admitting: Internal Medicine

## 2015-01-05 VITALS — BP 128/70 | HR 70 | Ht 72.0 in | Wt 147.2 lb

## 2015-01-05 DIAGNOSIS — I493 Ventricular premature depolarization: Secondary | ICD-10-CM

## 2015-01-05 DIAGNOSIS — R5383 Other fatigue: Secondary | ICD-10-CM | POA: Diagnosis not present

## 2015-01-05 DIAGNOSIS — I472 Ventricular tachycardia: Secondary | ICD-10-CM

## 2015-01-05 DIAGNOSIS — I4729 Other ventricular tachycardia: Secondary | ICD-10-CM

## 2015-01-05 DIAGNOSIS — I519 Heart disease, unspecified: Secondary | ICD-10-CM

## 2015-01-05 NOTE — Assessment & Plan Note (Signed)
This may be related to amiodarone, or LV dysfunction. Will follow.

## 2015-01-05 NOTE — Assessment & Plan Note (Signed)
Will plan to recheck his echo in about 2 months.

## 2015-01-05 NOTE — Progress Notes (Signed)
HPI [name] returns today for followup of his PVC's. He is a pleasant 81 yo man who developed severe weakness and was found to have bigeminal PVC's as well as sustained couplets making for an effective HR in the 30's. He was very weak with this. He was started on amiodarone and his dizziness worsened. His amiodarone was reduced and he has begun to feel better. He denies chest pain or sob. His weakness has improved.  Allergies  Allergen Reactions  . Atenolol Diarrhea and Other (See Comments)    Very weak, fatigued, lethargic, no appetite     Current Outpatient Prescriptions  Medication Sig Dispense Refill  . amiodarone (PACERONE) 100 MG tablet Take 100 mg by mouth daily.    Marland Kitchen aspirin 81 MG tablet Take 81 mg by mouth daily.    Marland Kitchen levothyroxine (SYNTHROID, LEVOTHROID) 75 MCG tablet Take 75 mcg by mouth daily.     No current facility-administered medications for this visit.     Past Medical History  Diagnosis Date  . Hyperlipidemia   . Coronary artery disease     a. s/p 2v CABG (seq LIMA to Diag and LAD, SVG to OM) on 05/15/2001.  Marland Kitchen Hypothyroidism   . PMR (polymyalgia rheumatica)   . NSVT (nonsustained ventricular tachycardia)   . PVC's (premature ventricular contractions)   . Sleep apnea     "wife says I do" (11/18/2014)  . Anemia   . Arthritis     "touch in the fingers" (11/17/2014)  . Fibromyalgia     "a long time ago" (11/17/2014)  . History of gout   . Melanoma of back   . CKD (chronic kidney disease), stage III   . LBBB (left bundle branch block)   . Chronic combined systolic and diastolic CHF (congestive heart failure)     a. 11/17/14 showed EF 25-30%, suboptimal image quality, diffuse hypokinesis worse in the inferior wall, mild to moderate MR. New drop EF compared to 2012 but cath was stable.    ROS:   All systems reviewed and negative except as noted in the HPI.   Past Surgical History  Procedure Laterality Date  . Coronary artery bypass graft  05/15/2001      x3 sequential LIMA to diag and LAD, SVG to OM   . Cardiac catheterization  04/28/2001  . Melanoma excision  2001    off of back  . Cataract extraction w/ intraocular lens  implant, bilateral Bilateral ~ 2010  . Axillary lymph node dissection Bilateral 2001  . Cardiac catheterization N/A 11/19/2014    Procedure: Right/Left Heart Cath and Coronary/Graft Angiography;  Surgeon: Jettie Booze, MD;  Location: [facility];  Service: Cardiovascular;  Laterality: N/A;     Family History  Problem Relation Age of Onset  . Liver cancer Mother   . Prostate cancer Father   . Hyperlipidemia Son   . Hyperlipidemia Daughter      Social History   Social History  . Marital Status: Married    Spouse Name: N/A  . Number of Children: N/A  . Years of Education: N/A   Occupational History  . Not on file.   Social History Main Topics  . Smoking status: Never Smoker   . Smokeless tobacco: Never Used  . Alcohol Use: 3.6 oz/week    3 Glasses of wine, 3 Cans of beer per week  . Drug Use: No  . Sexual Activity: No   Other Topics Concern  . Not  on file   Social History Narrative     BP 128/70 mmHg  Pulse 70  Ht 6' (1.829 m)  Wt 147 lb 3.2 oz (66.769 kg)  BMI 19.96 kg/m2  Physical Exam:  Well appearing elderly man, NAD HEENT: Unremarkable Neck:  6 cm JVD, no thyromegally Back:  No CVA tenderness Lungs:  Clear with no wheezes HEART:  Regular rate rhythm, no murmurs, no rubs, no clicks Abd:  soft, positive bowel sounds, no organomegally, no rebound, no guarding Ext:  2 plus pulses, no edema, no cyanosis, no clubbing Skin:  No rashes no nodules Neuro:  CN II through XII intact, motor grossly intact  EKG -  nsr with no PVC's   Assess/Plan:

## 2015-01-05 NOTE — Assessment & Plan Note (Signed)
His PVC's are improved. Hopefully this will continue and we will check a 24 hour holter in about 6 weeks.

## 2015-01-05 NOTE — Patient Instructions (Addendum)
Medication Instructions:  Your physician recommends that you continue on your current medications as directed. Please refer to the Current Medication list given to you today.  Labwork: None ordered  Testing/Procedures: Your physician has recommended that you wear a 24 holter monitor - in 8 weeks. Holter monitors are medical devices that record the heart's electrical activity. Doctors most often use these monitors to diagnose arrhythmias. Arrhythmias are problems with the speed or rhythm of the heartbeat. The monitor is a small, portable device. You can wear one while you do your normal daily activities. This is usually used to diagnose what is causing palpitations/syncope (passing out).  Follow-Up: Your physician recommends that you schedule a follow-up appointment, after holter monitor, with Dr. [name].   Any Other Special Instructions Will Be Listed Below (If Applicable). Thank you for choosing [facility]!!

## 2015-01-30 ENCOUNTER — Emergency Department (HOSPITAL_COMMUNITY): Payer: Medicare HMO

## 2015-01-30 ENCOUNTER — Emergency Department (HOSPITAL_COMMUNITY)
Admission: EM | Admit: 2015-01-30 | Discharge: 2015-01-30 | Disposition: A | Discharge status: Home / Self Care | Payer: Medicare HMO | Attending: Emergency Medicine | Admitting: Emergency Medicine

## 2015-01-30 ENCOUNTER — Encounter (HOSPITAL_COMMUNITY): Payer: Self-pay | Admitting: *Deleted

## 2015-01-30 DIAGNOSIS — Z9889 Other specified postprocedural states: Secondary | ICD-10-CM | POA: Diagnosis not present

## 2015-01-30 DIAGNOSIS — W1809XA Striking against other object with subsequent fall, initial encounter: Secondary | ICD-10-CM | POA: Diagnosis not present

## 2015-01-30 DIAGNOSIS — Y998 Other external cause status: Secondary | ICD-10-CM | POA: Diagnosis not present

## 2015-01-30 DIAGNOSIS — Z951 Presence of aortocoronary bypass graft: Secondary | ICD-10-CM | POA: Diagnosis not present

## 2015-01-30 DIAGNOSIS — Y9289 Other specified places as the place of occurrence of the external cause: Secondary | ICD-10-CM | POA: Insufficient documentation

## 2015-01-30 DIAGNOSIS — M1990 Unspecified osteoarthritis, unspecified site: Secondary | ICD-10-CM | POA: Diagnosis not present

## 2015-01-30 DIAGNOSIS — Z23 Encounter for immunization: Secondary | ICD-10-CM | POA: Insufficient documentation

## 2015-01-30 DIAGNOSIS — Z862 Personal history of diseases of the blood and blood-forming organs and certain disorders involving the immune mechanism: Secondary | ICD-10-CM | POA: Diagnosis not present

## 2015-01-30 DIAGNOSIS — E039 Hypothyroidism, unspecified: Secondary | ICD-10-CM | POA: Diagnosis not present

## 2015-01-30 DIAGNOSIS — I5042 Chronic combined systolic (congestive) and diastolic (congestive) heart failure: Secondary | ICD-10-CM | POA: Diagnosis not present

## 2015-01-30 DIAGNOSIS — N183 Chronic kidney disease, stage 3 (moderate): Secondary | ICD-10-CM | POA: Diagnosis not present

## 2015-01-30 DIAGNOSIS — Y9301 Activity, walking, marching and hiking: Secondary | ICD-10-CM | POA: Diagnosis not present

## 2015-01-30 DIAGNOSIS — Z8669 Personal history of other diseases of the nervous system and sense organs: Secondary | ICD-10-CM | POA: Insufficient documentation

## 2015-01-30 DIAGNOSIS — Z7982 Long term (current) use of aspirin: Secondary | ICD-10-CM | POA: Insufficient documentation

## 2015-01-30 DIAGNOSIS — S00401A Unspecified superficial injury of right ear, initial encounter: Secondary | ICD-10-CM | POA: Diagnosis present

## 2015-01-30 DIAGNOSIS — Z8639 Personal history of other endocrine, nutritional and metabolic disease: Secondary | ICD-10-CM | POA: Diagnosis not present

## 2015-01-30 DIAGNOSIS — I2510 Atherosclerotic heart disease of native coronary artery without angina pectoris: Secondary | ICD-10-CM | POA: Insufficient documentation

## 2015-01-30 DIAGNOSIS — W19XXXA Unspecified fall, initial encounter: Secondary | ICD-10-CM

## 2015-01-30 DIAGNOSIS — Z85820 Personal history of malignant melanoma of skin: Secondary | ICD-10-CM | POA: Insufficient documentation

## 2015-01-30 DIAGNOSIS — S01311A Laceration without foreign body of right ear, initial encounter: Secondary | ICD-10-CM | POA: Diagnosis not present

## 2015-01-30 DIAGNOSIS — Z79899 Other long term (current) drug therapy: Secondary | ICD-10-CM | POA: Insufficient documentation

## 2015-01-30 MED ORDER — LIDOCAINE HCL 2 % IJ SOLN
15.0000 mL | Freq: Once | INTRAMUSCULAR | Status: AC
  Administered 2015-01-30: 300 mg
  Filled 2015-01-30: qty 20

## 2015-01-30 MED ORDER — TETANUS-DIPHTH-ACELL PERTUSSIS 5-2.5-18.5 LF-MCG/0.5 IM SUSP
0.5000 mL | Freq: Once | INTRAMUSCULAR | Status: AC
  Administered 2015-01-30: 0.5 mL via INTRAMUSCULAR
  Filled 2015-01-30: qty 0.5

## 2015-01-30 MED ORDER — LIDOCAINE-PRILOCAINE 2.5-2.5 % EX CREA
TOPICAL_CREAM | Freq: Once | CUTANEOUS | Status: AC
Start: 2015-01-30 — End: 2015-01-30
  Administered 2015-01-30: 20:00:00 via TOPICAL
  Filled 2015-01-30: qty 5

## 2015-01-30 NOTE — Discharge Instructions (Signed)
Facial Laceration ° A facial laceration is a cut on the face. These injuries can be painful and cause bleeding. Lacerations usually heal quickly, but they need special care to reduce scarring. °DIAGNOSIS  °Your health care provider will take a medical history, ask for details about how the injury occurred, and examine the wound to determine how deep the cut is. °TREATMENT  °Some facial lacerations may not require closure. Others may not be able to be closed because of an increased risk of infection. The risk of infection and the chance for successful closure will depend on various factors, including the amount of time since the injury occurred. °The wound may be cleaned to help prevent infection. If closure is appropriate, pain medicines may be given if needed. Your health care provider will use stitches (sutures), wound glue (adhesive), or skin adhesive strips to repair the laceration. These tools bring the skin edges together to allow for faster healing and a better cosmetic outcome. If needed, you may also be given a tetanus shot. °HOME CARE INSTRUCTIONS °· Only take over-the-counter or prescription medicines as directed by your health care provider. °· Follow your health care provider's instructions for wound care. These instructions will vary depending on the technique used for closing the wound. °For Sutures: °· Keep the wound clean and dry.   °· If you were given a bandage (dressing), you should change it at least once a day. Also change the dressing if it becomes wet or dirty, or as directed by your health care provider.   °· Wash the wound with soap and water 2 times a day. Rinse the wound off with water to remove all soap. Pat the wound dry with a clean towel.   °· After cleaning, apply a thin layer of the antibiotic ointment recommended by your health care provider. This will help prevent infection and keep the dressing from sticking.   °· You may shower as usual after the first 24 hours. Do not soak the  wound in water until the sutures are removed.   °· Get your sutures removed as directed by your health care provider. With facial lacerations, sutures should usually be taken out after 4-5 days to avoid stitch marks.   °· Wait a few days after your sutures are removed before applying any makeup. °For Skin Adhesive Strips: °· Keep the wound clean and dry.   °· Do not get the skin adhesive strips wet. You may bathe carefully, using caution to keep the wound dry.   °· If the wound gets wet, pat it dry with a clean towel.   °· Skin adhesive strips will fall off on their own. You may trim the strips as the wound heals. Do not remove skin adhesive strips that are still stuck to the wound. They will fall off in time.   °For Wound Adhesive: °· You may briefly wet your wound in the shower or bath. Do not soak or scrub the wound. Do not swim. Avoid periods of heavy sweating until the skin adhesive has fallen off on its own. After showering or bathing, gently pat the wound dry with a clean towel.   °· Do not apply liquid medicine, cream medicine, ointment medicine, or makeup to your wound while the skin adhesive is in place. This may loosen the film before your wound is healed.   °· If a dressing is placed over the wound, be careful not to apply tape directly over the skin adhesive. This may cause the adhesive to be pulled off before the wound is healed.   °· Avoid   prolonged exposure to sunlight or tanning lamps while the skin adhesive is in place. °· The skin adhesive will usually remain in place for 5-10 days, then naturally fall off the skin. Do not pick at the adhesive film.   °After Healing: °Once the wound has healed, cover the wound with sunscreen during the day for 1 full year. This can help minimize scarring. Exposure to ultraviolet light in the first year will darken the scar. It can take 1-2 years for the scar to lose its redness and to heal completely.  °SEEK IMMEDIATE MEDICAL CARE IF: °· You have redness, pain, or  swelling around the wound.   °· You see a yellowish-white fluid (pus) coming from the wound.   °· You have chills or a fever.   °MAKE SURE YOU: °· Understand these instructions. °· Will watch your condition. °· Will get help right away if you are not doing well or get worse. °Document Released: 05/24/2004 Document Revised: 02/04/2013 Document Reviewed: 11/27/2012 °ExitCare® Patient Information ©2015 [employer]. This information is not intended to replace advice given to you by your health care provider. Make sure you discuss any questions you have with your health care provider. ° °Fall Prevention and Home Safety °Falls cause injuries and can affect all age groups. It is possible to use preventive measures to significantly decrease the likelihood of falls. There are many simple measures which can make your home safer and prevent falls. °OUTDOORS °· Repair cracks and edges of walkways and driveways. °· Remove high doorway thresholds. °· Trim shrubbery on the main path into your home. °· Have good outside lighting. °· Clear walkways of tools, rocks, debris, and clutter. °· Check that handrails are not broken and are securely fastened. Both sides of steps should have handrails. °· Have leaves, snow, and ice cleared regularly. °· Use sand or salt on walkways during winter months. °· In the garage, clean up grease or oil spills. °BATHROOM °· Install night lights. °· Install grab bars by the toilet and in the tub and shower. °· Use non-skid mats or decals in the tub or shower. °· Place a plastic non-slip stool in the shower to sit on, if needed. °· Keep floors dry and clean up all water on the floor immediately. °· Remove soap buildup in the tub or shower on a regular basis. °· Secure bath mats with non-slip, double-sided rug tape. °· Remove throw rugs and tripping hazards from the floors. °BEDROOMS °· Install night lights. °· Make sure a bedside light is easy to reach. °· Do not use oversized bedding. °· Keep a  telephone by your bedside. °· Have a firm chair with side arms to use for getting dressed. °· Remove throw rugs and tripping hazards from the floor. °KITCHEN °· Keep handles on pots and pans turned toward the center of the stove. Use back burners when possible. °· Clean up spills quickly and allow time for drying. °· Avoid walking on wet floors. °· Avoid hot utensils and knives. °· Position shelves so they are not too high or low. °· Place commonly used objects within easy reach. °· If necessary, use a sturdy step stool with a grab bar when reaching. °· Keep electrical cables out of the way. °· Do not use floor polish or wax that makes floors slippery. If you must use wax, use non-skid floor wax. °· Remove throw rugs and tripping hazards from the floor. °STAIRWAYS °· Never leave objects on stairs. °· Place handrails on both sides of stairways and use them.   Fix any loose handrails. Make sure handrails on both sides of the stairways are as long as the stairs. °· Check carpeting to make sure it is firmly attached along stairs. Make repairs to worn or loose carpet promptly. °· Avoid placing throw rugs at the top or bottom of stairways, or properly secure the rug with carpet tape to prevent slippage. Get rid of throw rugs, if possible. °· Have an electrician put in a light switch at the top and bottom of the stairs. °OTHER FALL PREVENTION TIPS °· Wear low-heel or rubber-soled shoes that are supportive and fit well. Wear closed toe shoes. °· When using a stepladder, make sure it is fully opened and both spreaders are firmly locked. Do not climb a closed stepladder. °· Add color or contrast paint or tape to grab bars and handrails in your home. Place contrasting color strips on first and last steps. °· Learn and use mobility aids as needed. Install an electrical emergency response system. °· Turn on lights to avoid dark areas. Replace light bulbs that burn out immediately. Get light switches that glow. °· Arrange furniture  to create clear pathways. Keep furniture in the same place. °· Firmly attach carpet with non-skid or double-sided tape. °· Eliminate uneven floor surfaces. °· Select a carpet pattern that does not visually hide the edge of steps. °· Be aware of all pets. °OTHER HOME SAFETY TIPS °· Set the water temperature for 120° F (48.8° C). °· Keep emergency numbers on or near the telephone. °· Keep smoke detectors on every level of the home and near sleeping areas. °Document Released: 04/06/2002 Document Revised: 10/16/2011 Document Reviewed: 07/06/2011 °ExitCare® Patient Information ©2015 [employer]. This information is not intended to replace advice given to you by your health care provider. Make sure you discuss any questions you have with your health care provider. ° °

## 2015-01-30 NOTE — ED Notes (Signed)
Pt called out c/o "heart issues." Assessment of cardiac monitoring reveals numerous PVCs.  Pt is otherwise asymptomatic.  RN raised both side rails, provided call light and encouraged pt to call again if he becomes distressed. PA Tiffany notified.

## 2015-01-30 NOTE — ED Notes (Signed)
Pt reports hx of bypass surgery years ago, saw cardiologist yearly, in June pt had fall, had cardiac cath, dx with CHF, started on amiodarone. Pt reports since then his balance has been off. Pt today fell to his right, has multiple lacerations and tears to outside of right ear and inside ear. Blood oozing. Pain 7/10. No LOC.

## 2015-01-30 NOTE — ED Notes (Signed)
Dr. Kohut at bedside 

## 2015-01-30 NOTE — ED Provider Notes (Signed)
CSN: [id number]     Arrival date & time 01/30/15  1846 History   First MD Initiated Contact with Patient 01/30/15 1854     Chief Complaint  Patient presents with  . Fall  . Ear Injury     (Consider location/radiation/quality/duration/timing/severity/associated sxs/prior Treatment) HPI  Patient has a history of hyperlipidemia coronary artery disease hypothyroidism nonsustained ventricular tachycardia, polymyalgia rheumatica, sleep apnea, arthritis, anemia, fibromyalgia, chronic systolic and diastolic heart failure. The cardiologist recently has been adjusting his dose of amiodarone which was assembled balance problems. This evening he was walking and lost his balance falling onto his right side, he hit his ear on the side of the chair causing laceration. Dr. Wilson Singer had seen patient prior to my arrival into the exam room and reports the lacerations are all external. Pt denies LOC and reports he remembers the whole thing. He believes that he is not UTD on his tetanus shot and will need one today. Denies CP, sob or weakness precipitating his fall. Denies having any other symptoms at this time.  PCP: [name], MD  Gary Gonzalez is a 81 y.o.  male  CROS: The patient denies diaphoresis, fever, headache, weakness (general or focal), confusion, change of vision,  dysphagia, aphagia, shortness of breath,  abdominal pains, nausea, vomiting, diarrhea, lower extremity swelling, neck pain, chest pain   Past Medical History  Diagnosis Date  . Hyperlipidemia   . Coronary artery disease     a. s/p 2v CABG (seq LIMA to Diag and LAD, SVG to OM) on 05/15/2001.  Marland Kitchen Hypothyroidism   . PMR (polymyalgia rheumatica) ([facility])   . NSVT (nonsustained ventricular tachycardia) ([place])   . PVC's (premature ventricular contractions)   . Sleep apnea     "wife says I do" (11/18/2014)  . Anemia   . Arthritis     "touch in the fingers" (11/17/2014)  . Fibromyalgia     "a long time ago" (11/17/2014)  . History of gout   .  Melanoma of back ([place])   . CKD (chronic kidney disease), stage III   . LBBB (left bundle branch block)   . Chronic combined systolic and diastolic CHF (congestive heart failure) ([place])     a. 11/17/14 showed EF 25-30%, suboptimal image quality, diffuse hypokinesis worse in the inferior wall, mild to moderate MR. New drop EF compared to 2012 but cath was stable.   Past Surgical History  Procedure Laterality Date  . Coronary artery bypass graft  05/15/2001    x3 sequential LIMA to diag and LAD, SVG to OM   . Cardiac catheterization  04/28/2001  . Melanoma excision  2001    off of back  . Cataract extraction w/ intraocular lens  implant, bilateral Bilateral ~ 2010  . Axillary lymph node dissection Bilateral 2001  . Cardiac catheterization N/A 11/19/2014    Procedure: Right/Left Heart Cath and Coronary/Graft Angiography;  Surgeon: Jettie Booze, MD;  Location: [facility];  Service: Cardiovascular;  Laterality: N/A;   Family History  Problem Relation Age of Onset  . Liver cancer Mother   . Prostate cancer Father   . Hyperlipidemia Son   . Hyperlipidemia Daughter    Social History  Substance Use Topics  . Smoking status: Never Smoker   . Smokeless tobacco: Never Used  . Alcohol Use: 3.6 oz/week    3 Glasses of wine, 3 Cans of beer per week    Review of Systems  10 Systems reviewed and are negative for acute  change except as noted in the HPI.     Allergies  Atenolol  Home Medications   Prior to Admission medications   Medication Sig Start Date End Date Taking? Authorizing Provider  amiodarone (PACERONE) 200 MG tablet Take 100 mg by mouth daily.    Yes Historical Provider, MD  aspirin 81 MG tablet Take 81 mg by mouth daily.   Yes Historical Provider, MD  levothyroxine (SYNTHROID, LEVOTHROID) 75 MCG tablet Take 75-150 mcg by mouth daily before breakfast. Take 53mcg on all days except Sunday & Wednesday take 150 mcg   Yes Historical Provider, MD  mirtazapine  (REMERON) 7.5 MG tablet Take 7.5 mg by mouth at bedtime.   Yes Historical Provider, MD   BP 169/61 mmHg  Pulse 63  Temp(Src) 98.2 F (36.8 C) (Oral)  Resp 20  SpO2 100% Physical Exam  Constitutional: He appears well-developed and well-nourished. No distress.  HENT:  Head: Normocephalic and atraumatic.  Ears:  Complex laceration thru and thru the right earlobe  Eyes: Pupils are equal, round, and reactive to light.  Neck: Normal range of motion. Neck supple.  No pain or swelling in the neck. No swollen glands. No pain with motion of the neck.   Cardiovascular: Normal rate and regular rhythm.   Pulmonary/Chest: Effort normal and breath sounds normal.  Abdominal: Soft.  Musculoskeletal:  No back pain or lower extremity swelling.  Neurological: He is alert.  Skin: Skin is warm and dry.  Nursing note and vitals reviewed.   ED Course  Procedures (including critical care time) Labs Review Labs Reviewed - No data to display  Imaging Review Ct Head Wo Contrast  01/30/2015   CLINICAL DATA:  Status post fall, with laceration to the right ear. Concern for head or cervical spine injury. Initial encounter.  EXAM: CT HEAD WITHOUT CONTRAST  CT CERVICAL SPINE WITHOUT CONTRAST  TECHNIQUE: Multidetector CT imaging of the head and cervical spine was performed following the standard protocol without intravenous contrast. Multiplanar CT image reconstructions of the cervical spine were also generated.  COMPARISON:  MRI of the brain performed 09/07/2003, and cervical spine radiographs performed 11/07/2007  FINDINGS: CT HEAD FINDINGS  There is no evidence of acute infarction, mass lesion, or intra- or extra-axial hemorrhage on CT.  Prominence of the sulci suggests mild cortical volume loss. Mild cerebellar atrophy is noted.  The brainstem and fourth ventricle are within normal limits. The basal ganglia are unremarkable in appearance. The cerebral hemispheres demonstrate grossly normal gray-white  differentiation. No mass effect or midline shift is seen.  There is no evidence of fracture; visualized osseous structures are unremarkable in appearance. The orbits are within normal limits. The paranasal sinuses and mastoid air cells are well-aerated. Prominent soft tissue injury is noted about the right ear.  CT CERVICAL SPINE FINDINGS  There is no evidence of acute fracture or subluxation. Vertebral bodies demonstrate normal height. There is minimal grade 1 retrolisthesis of C4 on C5, and multilevel disc space narrowing along the lower cervical spine, with scattered anterior and posterior disc osteophyte complexes. Prevertebral soft tissues are within normal limits.  The thyroid gland is unremarkable in appearance. The visualized lung apices are clear. Calcification is noted at the carotid bifurcations bilaterally.  IMPRESSION: 1. No evidence of traumatic intracranial injury or fracture. 2. No evidence for acute fracture or subluxation along the cervical spine. 3. Prominent soft tissue injury noted about the right ear. 4. Mild cortical volume loss noted. 5. Minimal degenerative change along the lower cervical  spine. 6. Calcification at the carotid bifurcations bilaterally. Carotid ultrasound would be helpful for further evaluation, when and as deemed clinically appropriate.   Electronically Signed   By: Garald Balding M.D.   On: 01/30/2015 21:30   Ct Cervical Spine Wo Contrast  01/30/2015   CLINICAL DATA:  Status post fall, with laceration to the right ear. Concern for head or cervical spine injury. Initial encounter.  EXAM: CT HEAD WITHOUT CONTRAST  CT CERVICAL SPINE WITHOUT CONTRAST  TECHNIQUE: Multidetector CT imaging of the head and cervical spine was performed following the standard protocol without intravenous contrast. Multiplanar CT image reconstructions of the cervical spine were also generated.  COMPARISON:  MRI of the brain performed 09/07/2003, and cervical spine radiographs performed 11/07/2007   FINDINGS: CT HEAD FINDINGS  There is no evidence of acute infarction, mass lesion, or intra- or extra-axial hemorrhage on CT.  Prominence of the sulci suggests mild cortical volume loss. Mild cerebellar atrophy is noted.  The brainstem and fourth ventricle are within normal limits. The basal ganglia are unremarkable in appearance. The cerebral hemispheres demonstrate grossly normal gray-white differentiation. No mass effect or midline shift is seen.  There is no evidence of fracture; visualized osseous structures are unremarkable in appearance. The orbits are within normal limits. The paranasal sinuses and mastoid air cells are well-aerated. Prominent soft tissue injury is noted about the right ear.  CT CERVICAL SPINE FINDINGS  There is no evidence of acute fracture or subluxation. Vertebral bodies demonstrate normal height. There is minimal grade 1 retrolisthesis of C4 on C5, and multilevel disc space narrowing along the lower cervical spine, with scattered anterior and posterior disc osteophyte complexes. Prevertebral soft tissues are within normal limits.  The thyroid gland is unremarkable in appearance. The visualized lung apices are clear. Calcification is noted at the carotid bifurcations bilaterally.  IMPRESSION: 1. No evidence of traumatic intracranial injury or fracture. 2. No evidence for acute fracture or subluxation along the cervical spine. 3. Prominent soft tissue injury noted about the right ear. 4. Mild cortical volume loss noted. 5. Minimal degenerative change along the lower cervical spine. 6. Calcification at the carotid bifurcations bilaterally. Carotid ultrasound would be helpful for further evaluation, when and as deemed clinically appropriate.   Electronically Signed   By: Garald Balding M.D.   On: 01/30/2015 21:30   I have personally reviewed and evaluated these images and lab results as part of my medical decision-making.   EKG Interpretation None      MDM   Final diagnoses:    Fall, initial encounter  Ear lobe laceration, right, initial encounter    LACERATION REPAIR Performed by: Linus Mako Authorized by: Linus Mako Consent: Verbal consent obtained. Risks and benefits: risks, benefits and alternatives were discussed Consent given by: patient Patient identity confirmed: provided demographic data Prepped and Draped in normal sterile fashion Wound explored  Laceration Location: right earlobe  Laceration Length: 3 cm  No Foreign Bodies seen or palpated  Anesthesia: local infiltration  Local anesthetic: lidocaine 2 % with epinephrine  Anesthetic total: 3 ml  Irrigation method: syringe Amount of cleaning: standard  Skin closure: sutures  Number of sutures: running sutures 12 stitches, 4-0 chromic gut (dissolvable)   Technique: running suture  Patient tolerance: Patient tolerated the procedure well with no immediate complications.  Dr. Wilson Singer has seen patient as well. Pt had a few asymptomatic PVC's while in the ED which he has a hx of, Dr. Wilson Singer made aware. Laceration repaired without difficulty. Head  and neck CT's are unremarkable.  Pt to f/u with Dr. Wellington Hampshire in the next few days for wound check.   Medications  lidocaine-prilocaine (EMLA) cream ( Topical Given 01/30/15 2014)  Tdap (BOOSTRIX) injection 0.5 mL (0.5 mLs Intramuscular Given 01/30/15 2014)  lidocaine (XYLOCAINE) 2 % (with pres) injection 300 mg (300 mg Other Given 01/30/15 2057)    81 y.o.Marshall Cork Pelot's medical screening exam was performed and I feel the patient has had an appropriate workup for their chief complaint at this time and likelihood of emergent condition existing is low. They have been counseled on decision, discharge, follow up and which symptoms necessitate immediate return to the emergency department. They or their family verbally stated understanding and agreement with plan and discharged in stable condition.   Vital signs are stable at discharge. Filed  Vitals:   01/30/15 1915  BP:   Pulse: 63  Temp: 98.2 F (36.8 C)  Resp: [address]      Delos Haring, PA-C 01/30/15 2147  Virgel Manifold, MD 02/02/15 [phone]

## 2015-02-16 ENCOUNTER — Encounter: Payer: Self-pay | Admitting: Internal Medicine

## 2015-02-22 ENCOUNTER — Encounter: Payer: Self-pay | Admitting: Gastroenterology

## 2015-02-25 ENCOUNTER — Encounter: Payer: Self-pay | Admitting: Internal Medicine

## 2015-03-01 ENCOUNTER — Other Ambulatory Visit: Payer: Self-pay | Admitting: Internal Medicine

## 2015-03-01 DIAGNOSIS — I472 Ventricular tachycardia: Secondary | ICD-10-CM

## 2015-03-01 DIAGNOSIS — I493 Ventricular premature depolarization: Secondary | ICD-10-CM

## 2015-03-01 DIAGNOSIS — I4729 Other ventricular tachycardia: Secondary | ICD-10-CM

## 2015-03-02 ENCOUNTER — Ambulatory Visit (INDEPENDENT_AMBULATORY_CARE_PROVIDER_SITE_OTHER): Payer: Medicare HMO

## 2015-03-02 DIAGNOSIS — I493 Ventricular premature depolarization: Secondary | ICD-10-CM | POA: Diagnosis not present

## 2015-03-02 DIAGNOSIS — I472 Ventricular tachycardia: Secondary | ICD-10-CM | POA: Diagnosis not present

## 2015-03-02 DIAGNOSIS — I4729 Other ventricular tachycardia: Secondary | ICD-10-CM

## 2015-03-10 ENCOUNTER — Ambulatory Visit: Payer: Medicare HMO | Admitting: Internal Medicine

## 2015-04-07 ENCOUNTER — Encounter: Payer: Self-pay | Admitting: Internal Medicine

## 2015-04-07 ENCOUNTER — Ambulatory Visit (INDEPENDENT_AMBULATORY_CARE_PROVIDER_SITE_OTHER): Payer: Medicare HMO | Admitting: Internal Medicine

## 2015-04-07 VITALS — BP 124/64 | HR 58 | Ht 72.0 in | Wt 149.0 lb

## 2015-04-07 DIAGNOSIS — I519 Heart disease, unspecified: Secondary | ICD-10-CM | POA: Diagnosis not present

## 2015-04-07 DIAGNOSIS — I5022 Chronic systolic (congestive) heart failure: Secondary | ICD-10-CM

## 2015-04-07 DIAGNOSIS — I2510 Atherosclerotic heart disease of native coronary artery without angina pectoris: Secondary | ICD-10-CM | POA: Diagnosis not present

## 2015-04-07 DIAGNOSIS — I493 Ventricular premature depolarization: Secondary | ICD-10-CM

## 2015-04-07 DIAGNOSIS — I2583 Coronary atherosclerosis due to lipid rich plaque: Secondary | ICD-10-CM

## 2015-04-07 NOTE — Assessment & Plan Note (Signed)
He is s/p CABG remotely and has no anginal symptoms.

## 2015-04-07 NOTE — Assessment & Plan Note (Signed)
He is much improved. Because of his dizziness, we will consider switching to a different AA drug. I have asked that he first undergo a 2D echo to see what his EF looks like. If it is improved, would consider either sotalol or Multaq. If not improved and still low, he will stay on low dose amiodarone.

## 2015-04-07 NOTE — Progress Notes (Signed)
HPI Gary Gonzalez returns today for followup of his PVC's. He is a pleasant 81 yo man who developed severe weakness and was found to have bigeminal PVC's as well as sustained couplets making for an effective HR in the 30's. He was very weak with this. His EF was very low. He was started on amiodarone and his dizziness worsened. His amiodarone was reduced and he has begun to feel better. He denies chest pain or sob. His weakness has improved. He has worn a 24 hour holter and his PVC burden went from 30000 to 300 PVC's/ 24 hours. His main complaint today is dizziness. He thinks that he has had this in the past but has gotten worse he thinks while on amiodarone. He denies CHF symptoms. He has a remote CABG. Allergies  Allergen Reactions  . Atenolol Diarrhea and Other (See Comments)    Very weak, fatigued, lethargic, no appetite     Current Outpatient Prescriptions  Medication Sig Dispense Refill  . amiodarone (PACERONE) 200 MG tablet Take 100 mg by mouth daily.     Marland Kitchen aspirin 81 MG tablet Take 81 mg by mouth daily.    Marland Kitchen levothyroxine (SYNTHROID, LEVOTHROID) 75 MCG tablet Take 75-150 mcg by mouth daily before breakfast. Take 57mcg on all days except Sunday & Wednesday take 150 mcg    . mirtazapine (REMERON) 7.5 MG tablet Take 7.5 mg by mouth at bedtime.     No current facility-administered medications for this visit.     Past Medical History  Diagnosis Date  . Hyperlipidemia   . Coronary artery disease     a. s/p 2v CABG (seq LIMA to Diag and LAD, SVG to OM) on 05/15/2001.  Marland Kitchen Hypothyroidism   . PMR (polymyalgia rheumatica) ([facility])   . NSVT (nonsustained ventricular tachycardia) ([place])   . PVC's (premature ventricular contractions)   . Sleep apnea     "wife says I do" (11/18/2014)  . Anemia   . Arthritis     "touch in the fingers" (11/17/2014)  . Fibromyalgia     "a long time ago" (11/17/2014)  . History of gout   . Melanoma of back ([place])   . CKD (chronic kidney disease), stage III   .  LBBB (left bundle branch block)   . Chronic combined systolic and diastolic CHF (congestive heart failure) ([place])     a. 11/17/14 showed EF 25-30%, suboptimal image quality, diffuse hypokinesis worse in the inferior wall, mild to moderate MR. New drop EF compared to 2012 but cath was stable.    ROS:   All systems reviewed and negative except as noted in the HPI.   Past Surgical History  Procedure Laterality Date  . Coronary artery bypass graft  05/15/2001    x3 sequential LIMA to diag and LAD, SVG to OM   . Cardiac catheterization  04/28/2001  . Melanoma excision  2001    off of back  . Cataract extraction w/ intraocular lens  implant, bilateral Bilateral ~ 2010  . Axillary lymph node dissection Bilateral 2001  . Cardiac catheterization N/A 11/19/2014    Procedure: Right/Left Heart Cath and Coronary/Graft Angiography;  Surgeon: Jettie Booze, MD;  Location: [facility];  Service: Cardiovascular;  Laterality: N/A;     Family History  Problem Relation Age of Onset  . Liver cancer Mother   . Prostate cancer Father   . Hyperlipidemia Son   . Hyperlipidemia Daughter      Social History  Social History  . Marital Status: Married    Spouse Name: N/A  . Number of Children: N/A  . Years of Education: N/A   Occupational History  . Not on file.   Social History Main Topics  . Smoking status: Never Smoker   . Smokeless tobacco: Never Used  . Alcohol Use: 3.6 oz/week    3 Glasses of wine, 3 Cans of beer per week  . Drug Use: No  . Sexual Activity: No   Other Topics Concern  . Not on file   Social History Narrative     BP 124/64 mmHg  Pulse 58  Ht 6' (1.829 m)  Wt 149 lb (67.586 kg)  BMI 20.20 kg/m2  SpO2 91%  Physical Exam:  Well appearing elderly man, NAD HEENT: Unremarkable Neck:  6 cm JVD, no thyromegally Back:  No CVA tenderness Lungs:  Clear with no wheezes HEART:  Regular rate rhythm, no murmurs, no rubs, no clicks Abd:  soft, positive  bowel sounds, no organomegally, no rebound, no guarding Ext:  2 plus pulses, no edema, no cyanosis, no clubbing Skin:  No rashes no nodules Neuro:  CN II through XII intact, motor grossly intact    Assess/Plan:

## 2015-04-07 NOTE — Patient Instructions (Addendum)
Medication Instructions:  Your physician recommends that you continue on your current medications as directed. Please refer to the Current Medication list given to you today.   Labwork: None ordered   Testing/Procedures:  Your physician has requested that you have an echocardiogram. Echocardiography is a painless test that uses sound waves to create images of your heart. It provides your doctor with information about the size and shape of your heart and how well your heart's chambers and valves are working. This procedure takes approximately one hour. There are no restrictions for this procedure.      Follow-Up: Your physician recommends that you schedule a follow-up appointment in: 6-8 weeks with Dr [name]   Any Other Special Instructions Will Be Listed Below (If Applicable).     If you need a refill on your cardiac medications before your next appointment, please call your pharmacy.

## 2015-04-07 NOTE — Assessment & Plan Note (Signed)
His previous echo demonstrated an EF of 30%. Hopefully his EF will have normalized as his PVC's have been treated.

## 2015-04-21 ENCOUNTER — Ambulatory Visit (HOSPITAL_COMMUNITY): Payer: Medicare HMO | Attending: Cardiovascular Disease

## 2015-04-21 ENCOUNTER — Other Ambulatory Visit: Payer: Self-pay

## 2015-04-21 DIAGNOSIS — I493 Ventricular premature depolarization: Secondary | ICD-10-CM | POA: Diagnosis not present

## 2015-04-21 DIAGNOSIS — I519 Heart disease, unspecified: Secondary | ICD-10-CM

## 2015-04-21 DIAGNOSIS — I5022 Chronic systolic (congestive) heart failure: Secondary | ICD-10-CM | POA: Diagnosis not present

## 2015-05-09 DIAGNOSIS — R7301 Impaired fasting glucose: Secondary | ICD-10-CM | POA: Diagnosis not present

## 2015-05-09 DIAGNOSIS — I493 Ventricular premature depolarization: Secondary | ICD-10-CM | POA: Diagnosis not present

## 2015-05-09 DIAGNOSIS — Z6820 Body mass index (BMI) 20.0-20.9, adult: Secondary | ICD-10-CM | POA: Diagnosis not present

## 2015-05-09 DIAGNOSIS — E038 Other specified hypothyroidism: Secondary | ICD-10-CM | POA: Diagnosis not present

## 2015-05-09 DIAGNOSIS — I509 Heart failure, unspecified: Secondary | ICD-10-CM | POA: Diagnosis not present

## 2015-05-11 ENCOUNTER — Encounter: Payer: Self-pay | Admitting: Internal Medicine

## 2015-05-23 ENCOUNTER — Other Ambulatory Visit: Payer: Self-pay | Admitting: Physician Assistant

## 2015-05-24 DIAGNOSIS — Z961 Presence of intraocular lens: Secondary | ICD-10-CM | POA: Diagnosis not present

## 2015-05-24 DIAGNOSIS — H5000 Unspecified esotropia: Secondary | ICD-10-CM | POA: Diagnosis not present

## 2015-05-24 DIAGNOSIS — H5203 Hypermetropia, bilateral: Secondary | ICD-10-CM | POA: Diagnosis not present

## 2015-06-07 ENCOUNTER — Encounter: Payer: Self-pay | Admitting: Physical Therapy

## 2015-06-07 ENCOUNTER — Ambulatory Visit: Payer: PPO | Attending: Internal Medicine | Admitting: Physical Therapy

## 2015-06-07 DIAGNOSIS — R2689 Other abnormalities of gait and mobility: Secondary | ICD-10-CM

## 2015-06-07 DIAGNOSIS — R6889 Other general symptoms and signs: Secondary | ICD-10-CM | POA: Diagnosis not present

## 2015-06-07 DIAGNOSIS — R29818 Other symptoms and signs involving the nervous system: Secondary | ICD-10-CM | POA: Insufficient documentation

## 2015-06-07 DIAGNOSIS — R269 Unspecified abnormalities of gait and mobility: Secondary | ICD-10-CM

## 2015-06-07 DIAGNOSIS — W19XXXS Unspecified fall, sequela: Secondary | ICD-10-CM | POA: Diagnosis not present

## 2015-06-07 DIAGNOSIS — R2681 Unsteadiness on feet: Secondary | ICD-10-CM

## 2015-06-07 NOTE — Therapy (Signed)
[facility] [address] [place], [place], [address] Phone: [phone]   Fax:  [phone]  Physical Therapy Evaluation  Patient Details  Name: Gary Gonzalez MRN: [id number] Date of Birth: Nov 26, 1926 Referring Provider: [name], MD  Encounter Date: 06/07/2015      PT End of Session - 06/07/15 1219    Visit Number 1   Number of Visits 18   Date for PT Re-Evaluation 08/05/15   Authorization Type Medicare G-code & progress note every 10th visit   PT Start Time 1040   PT Stop Time 1120   PT Time Calculation (min) 40 min   Activity Tolerance Patient tolerated treatment well   Behavior During Therapy [facility] for tasks assessed/performed      Past Medical History  Diagnosis Date  . Hyperlipidemia   . Coronary artery disease     a. s/p 2v CABG (seq LIMA to Diag and LAD, SVG to OM) on 05/15/2001.  Marland Kitchen Hypothyroidism   . PMR (polymyalgia rheumatica) (HCC)   . NSVT (nonsustained ventricular tachycardia) ([place])   . PVC's (premature ventricular contractions)   . Sleep apnea     "wife says I do" (11/18/2014)  . Anemia   . Arthritis     "touch in the fingers" (11/17/2014)  . Fibromyalgia     "a long time ago" (11/17/2014)  . History of gout   . Melanoma of back ([place])   . CKD (chronic kidney disease), stage III   . LBBB (left bundle branch block)   . Chronic combined systolic and diastolic CHF (congestive heart failure) ([place] [place])     a. 11/17/14 showed EF 25-30%, suboptimal image quality, diffuse hypokinesis worse in the inferior wall, mild to moderate MR. New drop EF compared to 2012 but cath was stable.    Past Surgical History  Procedure Laterality Date  . Coronary artery bypass graft  05/15/2001    x3 sequential LIMA to diag and LAD, SVG to OM   . Cardiac catheterization  04/28/2001  . Melanoma excision  2001    off of back  . Cataract extraction w/ intraocular lens  implant, bilateral Bilateral ~ 2010  . Axillary lymph  node dissection Bilateral 2001  . Cardiac catheterization N/A 11/19/2014    Procedure: Right/Left Heart Cath and Coronary/Graft Angiography;  Surgeon: Jettie Booze, MD;  Location: [facility];  Service: Cardiovascular;  Laterality: N/A;    There were no vitals filed for this visit.  Visit Diagnosis:  Abnormality of gait  Unsteadiness  Balance problems  Decreased functional activity tolerance  Falls, sequela      Subjective Assessment - 06/07/15 1048    Subjective This 82yo male has had increased balance issues with recurrent falls. He was referred to PT for evaluation.    Pertinent History CHF, CAD w/ CABG 2003, PVCs, LBBB, polymyalgia, fibromylagia, OA, gout, CKD stage III   Patient Stated Goals Walk without a cane.   Currently in Pain? No/denies            [facility] PT Assessment - 06/07/15 1040    Assessment   Medical Diagnosis Gait Abnormality, balance problems   Referring Provider [name], MD   Onset Date/Surgical Date 06/01/15  office visit   Precautions   Precautions Fall   Restrictions   Weight Bearing Restrictions No   Balance Screen   Has the patient fallen in the past 6 months Yes   How many times? 3  fell backwards, injured ear  with stitches   Has the patient had a decrease in activity level because of a fear of falling?  Yes   Is the patient reluctant to leave their home because of a fear of falling?  Yes   [place] Private residence   Living Arrangements Spouse/significant other   Type of [place] to enter   Entrance Stairs-Number of Steps 1   Entrance Stairs-Rails None   Home Layout Two level;Able to live on main level with bedroom/bathroom   [place] - single point;Walker - 4 wheels;Shower seat   Prior Function   Level of Independence Independent;Independent with household mobility without device;Independent with community mobility without device   Vocation Retired    Observation/Other Assessments   Focus on Therapeutic Outcomes (FOTO)  26.67 Functional Status   Activities of Balance Confidence Scale (ABC Scale)  10.6%   Fear Avoidance Belief Questionnaire (FABQ)  19 (5/24)   Posture/Postural Control   Posture/Postural Control Postural limitations   Postural Limitations Forward head;Posterior pelvic tilt   ROM / Strength   AROM / PROM / Strength AROM;Strength   AROM   Overall AROM  Within functional limits for tasks performed   Overall AROM Comments Tightness in hamstrings   Strength   Overall Strength Within functional limits for tasks performed   Overall Strength Comments MMT UEs grossly & LEs 5/5 but muscle fatigue limits to one time contraction. Mild knee buckling noted on balance assessment   Transfers   Transfers Sit to Stand;Stand to Sit   Sit to Stand 6: Modified independent (Device/Increase time);Without upper extremity assist;From chair/3-in-1   Stand to Sit 6: Modified independent (Device/Increase time);Without upper extremity assist;To chair/3-in-1   Ambulation/Gait   Ambulation/Gait Yes   Ambulation/Gait Assistance 4: Min assist   Ambulation Distance (Feet) 250 Feet   Assistive device Straight cane  quad tip   Gait Pattern Decreased stride length;Decreased arm swing - left;Step-through pattern;Right foot flat;Left foot flat;Right flexed knee in stance;Left flexed knee in stance   Ambulation Surface Indoor;Level   Gait velocity 2.62 ft/sec  Limited community    Stairs Yes   Stairs Assistance 4: Min guard   Stair Management Technique One rail Left;With cane;Alternating pattern;Step to pattern;Forwards  ascend alternating, descend step-to   Number of Stairs 4   6 minute walk test results    Endurance additional comments Resting HR 70, SpO2 98%, after balance & gait assessment HR 93, SpO2 99%   Standardized Balance Assessment   Standardized Balance Assessment Berg Balance Test;Timed Up and Go Test   Berg Balance Test   Sit to Stand  Able to stand without using hands and stabilize independently   Standing Unsupported Able to stand safely 2 minutes   Sitting with Back Unsupported but Feet Supported on Floor or Stool Able to sit safely and securely 2 minutes   Stand to Sit Sits safely with minimal use of hands   Transfers Able to transfer safely, minor use of hands   Standing Unsupported with Eyes Closed Able to stand 10 seconds with supervision   Standing Ubsupported with Feet Together Able to place feet together independently and stand for 1 minute with supervision   From Standing, Reach Forward with Outstretched Arm Can reach forward >5 cm safely (2")   From Standing Position, Pick up Object from Floor Able to pick up shoe, needs supervision   From Standing Position, Turn to Look Behind Over each Shoulder Turn sideways only  but maintains balance   Turn 360 Degrees Needs close supervision or verbal cueing   Standing Unsupported, Alternately Place Feet on Step/Stool Able to complete >2 steps/needs minimal assist   Standing Unsupported, One Foot in Front Needs help to step but can hold 15 seconds   Standing on One Leg Tries to lift leg/unable to hold 3 seconds but remains standing independently   Total Score 37   Timed Up and Go Test   Normal TUG (seconds) 11.37  single point cane with quad tip, min guard   Functional Gait  Assessment   Gait assessed  Yes   Gait Level Surface Walks 20 ft, slow speed, abnormal gait pattern, evidence for imbalance or deviates 10-15 in outside of the 12 in walkway width. Requires more than 7 sec to ambulate 20 ft.   Change in Gait Speed Makes only minor adjustments to walking speed, or accomplishes a change in speed with significant gait deviations, deviates 10-15 in outside the 12 in walkway width, or changes speed but loses balance but is able to recover and continue walking.   Gait with Horizontal Head Turns Performs head turns with moderate changes in gait velocity, slows down, deviates  10-15 in outside 12 in walkway width but recovers, can continue to walk.   Gait with Vertical Head Turns Performs task with severe disruption of gait (eg, staggers 15 in outside 12 in walkway width, loses balance, stops, reaches for wall).   Gait and Pivot Turn Turns slowly, requires verbal cueing, or requires several small steps to catch balance following turn and stop   Step Over Obstacle Is able to step over one shoe box (4.5 in total height) but must slow down and adjust steps to clear box safely. May require verbal cueing.   Gait with Narrow Base of Support Ambulates less than 4 steps heel to toe or cannot perform without assistance.   Gait with Eyes Closed Cannot walk 20 ft without assistance, severe gait deviations or imbalance, deviates greater than 15 in outside 12 in walkway width or will not attempt task.   Ambulating Backwards Cannot walk 20 ft without assistance, severe gait deviations or imbalance, deviates greater than 15 in outside 12 in walkway width or will not attempt task.   Steps Two feet to a stair, must use rail.   Total Score 5                             PT Short Term Goals - 06/07/15 1247    PT SHORT TERM GOAL #1   Title Patient demonstrates understanding of initial HEP (Target Date: 07/07/2015)   Time 1   Period Months   Status New   PT SHORT TERM GOAL #2   Title Patient reaches 7" anteriorly, across midline, laterally & to ground safely with no balance losses. (Target Date: 07/07/2015)   Time 1   Period Months   Status New   PT SHORT TERM GOAL #3   Title Patient ambulates with straight cane with head turns to scan environment without balance losses.  (Target Date: 07/07/2015)   Time 1   Period Months   Status New   PT SHORT TERM GOAL #4   Title Patient ambulates 400' with straight cane with supervision.  (Target Date: 07/07/2015)   Time 1   Period Months   Status New   PT SHORT TERM GOAL #5   Title Patient negotiates ramps & curbs with  straight cane with min guard assist.  (Target Date: 07/07/2015)   Time 1   Period Months   Status New           PT Long Term Goals - Jul 07, 2015 1243    PT LONG TERM GOAL #1   Title Patient demonstrates & verbalizes understanding of ongoing HEP / fitness plan. (Target Date: 08/05/2015)   Time 2   Period Months   Status New   PT LONG TERM GOAL #2   Title Berg Balance >45/56 to indicate lower fall risk. (Target Date: 08/05/2015)   Time 2   Period Months   Status New   PT LONG TERM GOAL #3   Title Functional Gait Assessment >/= 12/30 to indicate lower fall risk. (Target Date: 08/05/2015)   Time 2   Period Months   Status New   PT LONG TERM GOAL #4   Title Patient verbalizes fall prevention strategies. (Target Date: 08/05/2015)   Time 2   Status New   PT LONG TERM GOAL #5   Title ABC scale improved >10% to indicate greater confidence with balance activites. (Target Date: 08/05/2015)   Time 2   Period Months   Status New   Additional Long Term Goals   Additional Long Term Goals Yes   PT LONG TERM GOAL #6   Title Patient ambulates 500' with straight cane outside including grass, ramps & curbs modified independent for safe community mobility. (Target Date: 08/05/2015)   Time 2   Period Months   Status New               Plan - July 07, 2015 1220    Clinical Impression Statement This 82yo male with significant cardiac history has had increased incidents of falls and balance problems which is beginning to limit his mobilty & activity level. His ABC (Activities of Balance Confidence) is 10.6% with significant issues with household and community activities. His MMT was WNL but muscle fatigue limits moblity with decline in strength noted funcitionally. Berg Balance 37/56 (<45/56 indicates Fall Risk)  indicates high fall risk. Functional Gait Assessment 5/30 (<19/30 indicates fall risk) indicates high risk with community & household gait. Patient's condition is evolving with recurrent falls and  treatment plan has moderate complexity.     Pt will benefit from skilled therapeutic intervention in order to improve on the following deficits Abnormal gait;Cardiopulmonary status limiting activity;Decreased activity tolerance;Decreased balance;Decreased endurance;Decreased knowledge of use of DME;Decreased mobility;Decreased safety awareness;Decreased strength;Impaired flexibility;Postural dysfunction   Rehab Potential Good   PT Frequency 2x / week   PT Duration --  9 weeks (60 days)   PT Treatment/Interventions ADLs/Self Care Home Management;DME Instruction;Gait training;Functional mobility training;Stair training;Therapeutic activities;Therapeutic exercise;Balance training;Neuromuscular re-education;Patient/family education;Vestibular   PT Next Visit Plan Set-up HEP including hamstring / heelcord stretches, balance exercises    Consulted and Agree with Plan of Care Patient          G-Codes - 07/07/15 1253    Functional Assessment Tool Used Berg Balance 37 /56; Functional Gait Assessment 5/30   Functional Limitation Mobility: Walking and moving around   Mobility: Walking and Moving Around Current Status [phone]) At least 60 percent but less than 80 percent impaired, limited or restricted   Mobility: Walking and Moving Around Goal Status [phone]) At least 20 percent but less than 40 percent impaired, limited or restricted       Problem List Patient Active Problem List   Diagnosis Date Noted  . Chronic systolic heart failure ([facility])   .  S/P CABG x 2   . Chronic kidney disease (CKD)   . Fatigue 11/11/2014  . NSVT (nonsustained ventricular tachycardia) ([facility]) 11/11/2014  . LV dysfunction 07/23/2014  . Hyperlipidemia 07/27/2013  . Dizzy spells 10/24/2011  . CAD (coronary artery disease) 03/30/2011  . PVC's (premature ventricular contractions) 03/30/2011  . ANEMIA, IRON DEFICIENCY 03/12/2008  . GERD 03/12/2008    Kaelei Wheeler PT, DPT 06/07/2015, 12:54 PM  [facility] [address] [place], [place], [address] Phone: [phone]   Fax:  [phone]  Name: Gary Gonzalez MRN: [id number] Date of Birth: [birth year]

## 2015-06-09 ENCOUNTER — Ambulatory Visit (INDEPENDENT_AMBULATORY_CARE_PROVIDER_SITE_OTHER): Payer: PPO | Admitting: Internal Medicine

## 2015-06-09 ENCOUNTER — Encounter: Payer: Self-pay | Admitting: Internal Medicine

## 2015-06-09 VITALS — BP 140/56 | HR 71 | Ht 72.0 in | Wt 155.8 lb

## 2015-06-09 DIAGNOSIS — I493 Ventricular premature depolarization: Secondary | ICD-10-CM | POA: Diagnosis not present

## 2015-06-09 MED ORDER — AMIODARONE HCL 200 MG PO TABS: 200.0000 mg | ORAL_TABLET | Freq: Every day | ORAL | Status: DC

## 2015-06-09 NOTE — Patient Instructions (Addendum)
Medication Instructions:  Your physician has recommended you make the following change in your medication:  1) Increase Amiodarone to 200mg  daily    Labwork: None ordered   Testing/Procedures: None ordered   Follow-Up: Your physician recommends that you schedule a follow-up appointment in: 6 weeks with Dr [name]       Any Other Special Instructions Will Be Listed Below (If Applicable).     If you need a refill on your cardiac medications before your next appointment, please call your pharmacy.

## 2015-06-09 NOTE — Progress Notes (Signed)
HPI Mr. Benda returns today for followup of his PVC's. He is a pleasant 82 yo man who developed severe weakness and was found to have bigeminal PVC's as well as sustained couplets making for an effective HR in the 30's. He was very weak with this. His EF was very low. He was started on amiodarone and his dizziness worsened. His amiodarone was reduced and he has begun to feel better. He denies chest pain or sob. His weakness has improved. He has worn a 24 hour holter and his PVC burden went from 30000 to 300 PVC's/ 24 hours. However, as we have reduced his dose of amio, his PVC's have worsened. An echo done a month ago revealed an EF of 45%. He has not had syncope. He notes that he remotely he has dizziness for many years. Allergies  Allergen Reactions  . Atenolol Diarrhea and Other (See Comments)    Very weak, fatigued, lethargic, no appetite     Current Outpatient Prescriptions  Medication Sig Dispense Refill  . amiodarone (PACERONE) 200 MG tablet Take 1 tablet (200 mg total) by mouth daily. 90 tablet 3  . aspirin 81 MG tablet Take 81 mg by mouth daily.    Marland Kitchen levothyroxine (SYNTHROID, LEVOTHROID) 75 MCG tablet Take 75 mcg by mouth daily before breakfast.     . mirtazapine (REMERON) 7.5 MG tablet Take 7.5 mg by mouth at bedtime.     No current facility-administered medications for this visit.     Past Medical History  Diagnosis Date  . Hyperlipidemia   . Coronary artery disease     a. s/p 2v CABG (seq LIMA to Diag and LAD, SVG to OM) on 05/15/2001.  Marland Kitchen Hypothyroidism   . PMR (polymyalgia rheumatica) (HCC)   . NSVT (nonsustained ventricular tachycardia) ([place])   . PVC's (premature ventricular contractions)   . Sleep apnea     "wife says I do" (11/18/2014)  . Anemia   . Arthritis     "touch in the fingers" (11/17/2014)  . Fibromyalgia     "a long time ago" (11/17/2014)  . History of gout   . Melanoma of back ([place])   . CKD (chronic kidney disease), stage III   . LBBB (left  bundle branch block)   . Chronic combined systolic and diastolic CHF (congestive heart failure) ([place])     a. 11/17/14 showed EF 25-30%, suboptimal image quality, diffuse hypokinesis worse in the inferior wall, mild to moderate MR. New drop EF compared to 2012 but cath was stable.    ROS:   All systems reviewed and negative except as noted in the HPI.   Past Surgical History  Procedure Laterality Date  . Coronary artery bypass graft  05/15/2001    x3 sequential LIMA to diag and LAD, SVG to OM   . Cardiac catheterization  04/28/2001  . Melanoma excision  2001    off of back  . Cataract extraction w/ intraocular lens  implant, bilateral Bilateral ~ 2010  . Axillary lymph node dissection Bilateral 2001  . Cardiac catheterization N/A 11/19/2014    Procedure: Right/Left Heart Cath and Coronary/Graft Angiography;  Surgeon: Jettie Booze, MD;  Location: [facility];  Service: Cardiovascular;  Laterality: N/A;     Family History  Problem Relation Age of Onset  . Liver cancer Mother   . Prostate cancer Father   . Hyperlipidemia Son   . Hyperlipidemia Daughter      Social History   Social  History  . Marital Status: Married    Spouse Name: N/A  . Number of Children: N/A  . Years of Education: N/A   Occupational History  . Not on file.   Social History Main Topics  . Smoking status: Never Smoker   . Smokeless tobacco: Never Used  . Alcohol Use: 3.6 oz/week    3 Glasses of wine, 3 Cans of beer per week  . Drug Use: No  . Sexual Activity: No   Other Topics Concern  . Not on file   Social History Narrative     BP 140/56 mmHg  Pulse 71  Ht 6' (1.829 m)  Wt 155 lb 12.8 oz (70.67 kg)  BMI 21.13 kg/m2  Physical Exam:  Well appearing elderly man, NAD HEENT: Unremarkable Neck:  6 cm JVD, no thyromegally Back:  No CVA tenderness Lungs:  Clear with no wheezes HEART:  IRegular rate rhythm, no murmurs, no rubs, no clicks Abd:  soft, positive bowel sounds, no  organomegally, no rebound, no guarding Ext:  2 plus pulses, no edema, no cyanosis, no clubbing Skin:  No rashes no nodules Neuro:  CN II through XII intact, motor grossly intact  ECG - NSR with frequent and consecutive PVC's.  Assess/Plan: 1. CAD, s/p CABG - he is angina free. Will follow. 2. PVC's - his PVC's appear to have worsened. I offered him switching to sotalol or uptitration of amio. He wanted to try increasing the amio. If his dizziness worsened then we will need to stop amio and switch to sotalol 3. Dizziness - still present. Will follow 4. HTN - his blood pressure is stable. Will follow.

## 2015-06-10 ENCOUNTER — Ambulatory Visit: Payer: PPO

## 2015-06-10 VITALS — HR 75

## 2015-06-10 DIAGNOSIS — R269 Unspecified abnormalities of gait and mobility: Secondary | ICD-10-CM

## 2015-06-10 DIAGNOSIS — R2681 Unsteadiness on feet: Secondary | ICD-10-CM

## 2015-06-10 NOTE — Patient Instructions (Addendum)
HIP: Hamstrings - Short Sitting    Rest leg on raised surface or the floor. Keep knee straight. Lift chest and lean forward, keeping back straight. Hold _30__ seconds. Repeat with other leg. _2-3__ reps per set, __2-3_ sets per day, __7_ days per week  Copyright  VHI. All rights reserved.   Heel Cord Stretch    Place one leg forward, bent, other leg behind and straight. Lean forward keeping back heel flat. Hold __30__ seconds while counting out loud. Repeat with other leg. Repeat __3__ times. Do _2-3___ sessions per day. Perform every day.   http://[email or web address].exer.us/511   Copyright  VHI. All rights reserved.      Up / Down Head Motion    Hold onto counter as needed. Walking on solid surface, move head and eyes toward ceiling for __2__ steps.  Then, move head and eyes toward floor for _2___ steps. Repeat __4__ times per session. Do __1__ sessions per day.   Copyright  VHI. All rights reserved.  Feet Close Together With Side to Side Head Motion    Hold onto counter as needed. Place feet closer together than normal width and walk, turning head and eyes to left for __2__ steps.  Then, turn head and eyes to opposite side for __2__ steps. Repeat sequence __4__ times per session. Do _1___ sessions per day.   Copyright  VHI. All rights reserved.  Feet Heel-Toe "Tandem", Varied Arm Positions - Eyes Open    Perform at kitchen sink with chair behind you for safety. With eyes open, right foot directly in front of the other, arms at your side, look straight ahead at a stationary object. Hold __30__ seconds. Repeat with left foot in front of the other. Repeat _3___ times per session. Do __1__ sessions per day.  Copyright  VHI. All rights reserved.  Feet Together, Varied Arm Positions - Eyes Closed    Perform at kitchen sink with chair behind you. Stand with feet together and arms at your side. Close eyes and visualize upright position. Hold __30__ seconds. Repeat __3__ times  per session. Do __1__ sessions per day.  Copyright  VHI. All rights reserved.  Braiding    Move to side: 1) cross right leg in front of left, 2) bring back leg out to side, then 3) cross right leg behind left, 4) bring left leg out to side. Continue sequence in same direction. Reverse sequence, moving in opposite direction. Repeat sequence _4___ times per session. Do __1__ sessions per day.   Copyright  VHI. All rights reserved.

## 2015-06-10 NOTE — Therapy (Signed)
[facility] [address] [place], [place], [address] Phone: [phone]   Fax:  [phone]  Physical Therapy Treatment  Patient Details  Name: Gary Gonzalez MRN: [id number] Date of Birth: [birth year] Referring Provider: [name], MD  Encounter Date: 06/10/2015      PT End of Session - 06/10/15 1650    Visit Number 2   Number of Visits 18   Date for PT Re-Evaluation 08/05/15   Authorization Type Medicare G-code & progress note every 10th visit   PT Start Time 1016   PT Stop Time 1058   PT Time Calculation (min) 42 min   Equipment Utilized During Treatment Gait belt   Activity Tolerance Patient tolerated treatment well   Behavior During Therapy [facility] for tasks assessed/performed      Past Medical History  Diagnosis Date  . Hyperlipidemia   . Coronary artery disease     a. s/p 2v CABG (seq LIMA to Diag and LAD, SVG to OM) on 05/15/2001.  Marland Kitchen Hypothyroidism   . PMR (polymyalgia rheumatica) (HCC)   . NSVT (nonsustained ventricular tachycardia) ([place])   . PVC's (premature ventricular contractions)   . Sleep apnea     "wife says I do" (11/18/2014)  . Anemia   . Arthritis     "touch in the fingers" (11/17/2014)  . Fibromyalgia     "a long time ago" (11/17/2014)  . History of gout   . Melanoma of back ([place])   . CKD (chronic kidney disease), stage III   . LBBB (left bundle branch block)   . Chronic combined systolic and diastolic CHF (congestive heart failure) ([place])     a. 11/17/14 showed EF 25-30%, suboptimal image quality, diffuse hypokinesis worse in the inferior wall, mild to moderate MR. New drop EF compared to 2012 but cath was stable.    Past Surgical History  Procedure Laterality Date  . Coronary artery bypass graft  05/15/2001    x3 sequential LIMA to diag and LAD, SVG to OM   . Cardiac catheterization  04/28/2001  . Melanoma excision  2001    off of back  . Cataract extraction w/ intraocular lens  implant,  bilateral Bilateral ~ 2010  . Axillary lymph node dissection Bilateral 2001  . Cardiac catheterization N/A 11/19/2014    Procedure: Right/Left Heart Cath and Coronary/Graft Angiography;  Surgeon: Jettie Booze, MD;  Location: [facility];  Service: Cardiovascular;  Laterality: N/A;    Filed Vitals:   06/10/15 1039  Pulse: 75  SpO2: 98%    Visit Diagnosis:  Abnormality of gait  Unsteadiness      Subjective Assessment - 06/10/15 1020    Subjective Pt reported he went to MD regarding PVCs, and MD increased medication to reduce PVCs. Pt informed MD that he was starting PT and reported MD thought this was great and did not state pt had restrictions. Pt would like to get back to golfing and going to the gym.   Pertinent History CHF, CAD w/ CABG 2003, PVCs, LBBB, polymyalgia, fibromylagia, OA, gout, CKD stage III   Patient Stated Goals Walk without a cane.   Currently in Pain? No/denies        Therex: Pt performed hamstring and gastroc stretches with demonstration and cues for technique. Please see pt instructions for details.  Neuro re-ed: Pt performed balance HEP with min guard-S for safety, at counter with UE support prn. Cues for technique. Pt also performed sidestepping 4x7' at counter,  no UE support, not added to HEP as this was not challenging for pt. Please see pt instructions for details.                         PT Education - 06/10/15 1649    Education provided Yes   Education Details PT educated pt on stretching/balance HEP. PT also encouraged pt to monitor HR and notify MD and PT if he feels like PVC's are increasing again. Pt would like to go to the gym again (went 5x/week prior to hospitalization in 10/2014), PT encouraged pt to try machines/aerobic during PT session prior to attempting gym, so HR can be monitored.   Person(s) Educated Patient   Methods Explanation;Demonstration;Tactile cues;Verbal cues;Handout   Comprehension Returned  demonstration;Verbalized understanding;Need further instruction          PT Short Term Goals - 06/10/15 1657    PT SHORT TERM GOAL #1   Title Patient demonstrates understanding of initial HEP (Target Date: 07/07/2015)   Time 1   Period Months   Status On-going   PT SHORT TERM GOAL #2   Title Patient reaches 7" anteriorly, across midline, laterally & to ground safely with no balance losses. (Target Date: 07/07/2015)   Time 1   Period Months   Status On-going   PT SHORT TERM GOAL #3   Title Patient ambulates with straight cane with head turns to scan environment without balance losses.  (Target Date: 07/07/2015)   Time 1   Period Months   Status On-going   PT SHORT TERM GOAL #4   Title Patient ambulates 400' with straight cane with supervision.  (Target Date: 07/07/2015)   Time 1   Period Months   Status On-going   PT SHORT TERM GOAL #5   Title Patient negotiates ramps & curbs with straight cane with min guard assist.  (Target Date: 07/07/2015)   Time 1   Period Months   Status On-going           PT Long Term Goals - 06/10/15 1657    PT LONG TERM GOAL #1   Title Patient demonstrates & verbalizes understanding of ongoing HEP / fitness plan. (Target Date: 08/05/2015)   Time 2   Period Months   Status On-going   PT LONG TERM GOAL #2   Title Berg Balance >45/56 to indicate lower fall risk. (Target Date: 08/05/2015)   Time 2   Period Months   Status On-going   PT LONG TERM GOAL #3   Title Functional Gait Assessment >/= 12/30 to indicate lower fall risk. (Target Date: 08/05/2015)   Time 2   Period Months   Status On-going   PT LONG TERM GOAL #4   Title Patient verbalizes fall prevention strategies. (Target Date: 08/05/2015)   Time 2   Status On-going   PT LONG TERM GOAL #5   Title ABC scale improved >10% to indicate greater confidence with balance activites. (Target Date: 08/05/2015)   Time 2   Period Months   Status On-going   PT LONG TERM GOAL #6   Title Patient ambulates 500'  with straight cane outside including grass, ramps & curbs modified independent for safe community mobility. (Target Date: 08/05/2015)   Time 2   Period Months   Status On-going               Plan - 06/10/15 1650    Clinical Impression Statement Pt's HR was in the 70's during session, with  no c/o of PVC sx's. Pt tolerated HEP well but did experience incr. postural sway during activities with eyes closed and gait with head turns, indicating decr. input from vestibular system. Pt would continue to benefit from skilled PT to improve safety during functional mobility.   Pt will benefit from skilled therapeutic intervention in order to improve on the following deficits Abnormal gait;Cardiopulmonary status limiting activity;Decreased activity tolerance;Decreased balance;Decreased endurance;Decreased knowledge of use of DME;Decreased mobility;Decreased safety awareness;Decreased strength;Impaired flexibility;Postural dysfunction   Rehab Potential Good   PT Frequency 2x / week   PT Duration Other (comment)  9 weeks (60 days)   PT Treatment/Interventions ADLs/Self Care Home Management;DME Instruction;Gait training;Functional mobility training;Stair training;Therapeutic activities;Therapeutic exercise;Balance training;Neuromuscular re-education;Patient/family education;Vestibular   PT Next Visit Plan Provide fall prevention handout. Dynamic gait/balance activities. Continue to monitor HR/pulse quality for PVC sx's.   PT Home Exercise Plan Balance/stretching HEP.   Consulted and Agree with Plan of Care Patient        Problem List Patient Active Problem List   Diagnosis Date Noted  . Chronic systolic heart failure ([place])   . S/P CABG x 2   . Chronic kidney disease (CKD)   . Fatigue 11/11/2014  . NSVT (nonsustained ventricular tachycardia) ([place]) 11/11/2014  . LV dysfunction 07/23/2014  . Hyperlipidemia 07/27/2013  . Dizzy spells 10/24/2011  . CAD (coronary artery disease) 03/30/2011  . PVC's  (premature ventricular contractions) 03/30/2011  . ANEMIA, IRON DEFICIENCY 03/12/2008  . GERD 03/12/2008    Yarethzy Croak L 06/10/2015, 4:58 PM  [facility] [address] [place], [place], [address] Phone: [phone]   Fax:  [phone]  Name: NIRAJ ORTLIP MRN: [id number] Date of Birth: 1926-06-16    Geoffry Paradise, PT,DPT 06/10/2015 4:58 PM Phone: [phone] Fax: [phone]

## 2015-06-14 DIAGNOSIS — E038 Other specified hypothyroidism: Secondary | ICD-10-CM | POA: Diagnosis not present

## 2015-06-15 ENCOUNTER — Ambulatory Visit: Payer: PPO | Admitting: Physical Therapy

## 2015-06-15 ENCOUNTER — Encounter: Payer: Self-pay | Admitting: Physical Therapy

## 2015-06-15 VITALS — HR 73

## 2015-06-15 DIAGNOSIS — R6889 Other general symptoms and signs: Secondary | ICD-10-CM

## 2015-06-15 DIAGNOSIS — R269 Unspecified abnormalities of gait and mobility: Secondary | ICD-10-CM

## 2015-06-15 DIAGNOSIS — W19XXXS Unspecified fall, sequela: Secondary | ICD-10-CM

## 2015-06-15 DIAGNOSIS — R2681 Unsteadiness on feet: Secondary | ICD-10-CM

## 2015-06-15 DIAGNOSIS — R2689 Other abnormalities of gait and mobility: Secondary | ICD-10-CM

## 2015-06-15 NOTE — Therapy (Signed)
[facility] [address] [place], [place], [address] Phone: [phone]   Fax:  [phone]  Physical Therapy Treatment  Patient Details  Name: Gary Gonzalez MRN: [id number] Date of Birth: 03/18/1927 Referring Provider: [name], MD  Encounter Date: 06/15/2015      PT End of Session - 06/15/15 1057    Visit Number 3   Number of Visits 18   Date for PT Re-Evaluation 08/05/15   PT Start Time 1015   PT Stop Time 1056   PT Time Calculation (min) 41 min   Equipment Utilized During Treatment Gait belt   Activity Tolerance Patient tolerated treatment well   Behavior During Therapy [facility] for tasks assessed/performed      Past Medical History  Diagnosis Date  . Hyperlipidemia   . Coronary artery disease     a. s/p 2v CABG (seq LIMA to Diag and LAD, SVG to OM) on 05/15/2001.  Marland Kitchen Hypothyroidism   . PMR (polymyalgia rheumatica) (HCC)   . NSVT (nonsustained ventricular tachycardia) ([place])   . PVC's (premature ventricular contractions)   . Sleep apnea     "wife says I do" (11/18/2014)  . Anemia   . Arthritis     "touch in the fingers" (11/17/2014)  . Fibromyalgia     "a long time ago" (11/17/2014)  . History of gout   . Melanoma of back ([place])   . CKD (chronic kidney disease), stage III   . LBBB (left bundle branch block)   . Chronic combined systolic and diastolic CHF (congestive heart failure) ([place])     a. 11/17/14 showed EF 25-30%, suboptimal image quality, diffuse hypokinesis worse in the inferior wall, mild to moderate MR. New drop EF compared to 2012 but cath was stable.    Past Surgical History  Procedure Laterality Date  . Coronary artery bypass graft  05/15/2001    x3 sequential LIMA to diag and LAD, SVG to OM   . Cardiac catheterization  04/28/2001  . Melanoma excision  2001    off of back  . Cataract extraction w/ intraocular lens  implant, bilateral Bilateral ~ 2010  . Axillary lymph node dissection  Bilateral 2001  . Cardiac catheterization N/A 11/19/2014    Procedure: Right/Left Heart Cath and Coronary/Graft Angiography;  Surgeon: Jettie Booze, MD;  Location: [facility];  Service: Cardiovascular;  Laterality: N/A;    Filed Vitals:   06/15/15 1019  Pulse: 73  SpO2: 96%    Visit Diagnosis:  Abnormality of gait  Unsteadiness  Balance problems  Decreased functional activity tolerance  Falls, sequela      Subjective Assessment - 06/15/15 1018    Subjective Exercises going "pretty good."  No cardiac complaints   Currently in Pain? No/denies            -Reviewed HEP; min cues for technique -On compliant surface with feet apart: EC x 30 sec, EC with horizontal/vertical head turns x 10; Feet together with EO horizontal/vertical head turns x 10 -Standing at counter: alt hip abdct; hip ext; marching x 10 bil with 2# -Forward step overs x 10 bil with intermittent UE support and minguard A                        PT Short Term Goals - 06/10/15 1657    PT SHORT TERM GOAL #1   Title Patient demonstrates understanding of initial HEP (Target Date: 07/07/2015)   Time 1  Period Months   Status On-going   PT SHORT TERM GOAL #2   Title Patient reaches 7" anteriorly, across midline, laterally & to ground safely with no balance losses. (Target Date: 07/07/2015)   Time 1   Period Months   Status On-going   PT SHORT TERM GOAL #3   Title Patient ambulates with straight cane with head turns to scan environment without balance losses.  (Target Date: 07/07/2015)   Time 1   Period Months   Status On-going   PT SHORT TERM GOAL #4   Title Patient ambulates 400' with straight cane with supervision.  (Target Date: 07/07/2015)   Time 1   Period Months   Status On-going   PT SHORT TERM GOAL #5   Title Patient negotiates ramps & curbs with straight cane with min guard assist.  (Target Date: 07/07/2015)   Time 1   Period Months   Status On-going            PT Long Term Goals - 06/10/15 1657    PT LONG TERM GOAL #1   Title Patient demonstrates & verbalizes understanding of ongoing HEP / fitness plan. (Target Date: 08/05/2015)   Time 2   Period Months   Status On-going   PT LONG TERM GOAL #2   Title Berg Balance >45/56 to indicate lower fall risk. (Target Date: 08/05/2015)   Time 2   Period Months   Status On-going   PT LONG TERM GOAL #3   Title Functional Gait Assessment >/= 12/30 to indicate lower fall risk. (Target Date: 08/05/2015)   Time 2   Period Months   Status On-going   PT LONG TERM GOAL #4   Title Patient verbalizes fall prevention strategies. (Target Date: 08/05/2015)   Time 2   Status On-going   PT LONG TERM GOAL #5   Title ABC scale improved >10% to indicate greater confidence with balance activites. (Target Date: 08/05/2015)   Time 2   Period Months   Status On-going   PT LONG TERM GOAL #6   Title Patient ambulates 500' with straight cane outside including grass, ramps & curbs modified independent for safe community mobility. (Target Date: 08/05/2015)   Time 2   Period Months   Status On-going               Plan - 06/15/15 1057    Clinical Impression Statement Pt required min to mod cues for correct technique with HEP; and reports compliance.  Pt very motivated to improve balance and stability.  Will continue to benefit from PT to maximize function.   PT Next Visit Plan Provide fall prevention handout. Dynamic gait/balance activities.    PT Home Exercise Plan Balance/stretching HEP.   Consulted and Agree with Plan of Care Patient        Problem List Patient Active Problem List   Diagnosis Date Noted  . Chronic systolic heart failure ([place])   . S/P CABG x 2   . Chronic kidney disease (CKD)   . Fatigue 11/11/2014  . NSVT (nonsustained ventricular tachycardia) ([place]) 11/11/2014  . LV dysfunction 07/23/2014  . Hyperlipidemia 07/27/2013  . Dizzy spells 10/24/2011  . CAD (coronary artery disease) 03/30/2011  .  PVC's (premature ventricular contractions) 03/30/2011  . ANEMIA, IRON DEFICIENCY 03/12/2008  . GERD 03/12/2008   Laureen Abrahams, PT, DPT 06/15/2015 10:59 AM  [facility] [address] [place], [place], [address] Phone: [phone]   Fax:  [phone]  Name:  Gary Gonzalez MRN: [id number] Date of Birth: August 02, 1926

## 2015-06-17 ENCOUNTER — Ambulatory Visit: Payer: PPO | Admitting: Physical Therapy

## 2015-06-17 DIAGNOSIS — R269 Unspecified abnormalities of gait and mobility: Secondary | ICD-10-CM | POA: Diagnosis not present

## 2015-06-17 DIAGNOSIS — R2689 Other abnormalities of gait and mobility: Secondary | ICD-10-CM

## 2015-06-17 DIAGNOSIS — W19XXXS Unspecified fall, sequela: Secondary | ICD-10-CM

## 2015-06-17 DIAGNOSIS — R2681 Unsteadiness on feet: Secondary | ICD-10-CM

## 2015-06-17 DIAGNOSIS — R6889 Other general symptoms and signs: Secondary | ICD-10-CM

## 2015-06-17 NOTE — Therapy (Signed)
[facility] [address] [place], [place], [address] Phone: [phone]   Fax:  [phone]  Physical Therapy Treatment  Patient Details  Name: Gary Gonzalez MRN: [id number] Date of Birth: May 05, 1926 Referring Provider: [name], MD  Encounter Date: 06/17/2015      PT End of Session - 06/17/15 1138    Visit Number 4   Number of Visits 18   Date for PT Re-Evaluation 08/05/15   Authorization Type Medicare G-code & progress note every 10th visit   PT Start Time 1055   PT Stop Time 1136   PT Time Calculation (min) 41 min   Equipment Utilized During Treatment Gait belt   Activity Tolerance Patient tolerated treatment well;Patient limited by fatigue   Behavior During Therapy [facility] for tasks assessed/performed      Past Medical History  Diagnosis Date  . Hyperlipidemia   . Coronary artery disease     a. s/p 2v CABG (seq LIMA to Diag and LAD, SVG to OM) on 05/15/2001.  Marland Kitchen Hypothyroidism   . PMR (polymyalgia rheumatica) (HCC)   . NSVT (nonsustained ventricular tachycardia) ([place])   . PVC's (premature ventricular contractions)   . Sleep apnea     "wife says I do" (11/18/2014)  . Anemia   . Arthritis     "touch in the fingers" (11/17/2014)  . Fibromyalgia     "a long time ago" (11/17/2014)  . History of gout   . Melanoma of back ([place])   . CKD (chronic kidney disease), stage III   . LBBB (left bundle branch block)   . Chronic combined systolic and diastolic CHF (congestive heart failure) ([place])     a. 11/17/14 showed EF 25-30%, suboptimal image quality, diffuse hypokinesis worse in the inferior wall, mild to moderate MR. New drop EF compared to 2012 but cath was stable.    Past Surgical History  Procedure Laterality Date  . Coronary artery bypass graft  05/15/2001    x3 sequential LIMA to diag and LAD, SVG to OM   . Cardiac catheterization  04/28/2001  . Melanoma excision  2001    off of back  . Cataract extraction w/  intraocular lens  implant, bilateral Bilateral ~ 2010  . Axillary lymph node dissection Bilateral 2001  . Cardiac catheterization N/A 11/19/2014    Procedure: Right/Left Heart Cath and Coronary/Graft Angiography;  Surgeon: Jettie Booze, MD;  Location: [facility];  Service: Cardiovascular;  Laterality: N/A;    There were no vitals filed for this visit.  Visit Diagnosis:  Abnormality of gait  Unsteadiness  Balance problems  Decreased functional activity tolerance  Falls, sequela      Subjective Assessment - 06/17/15 1057    Subjective exercises are going well; no new complaints   Patient Stated Goals Walk without a cane.   Currently in Pain? No/denies            -Static standing with UE reaching tasks/trunk rotation with minguard A with feet apart and together  -horizontal/vertical head turns on ramp facing uphill and downhill x 10 each direction  -standing hip hike on 4" step with and without march x 10 bil with bil UE support for glut med activation/strengthening  -minisquats 2 x 10 with bil UE support and min cues for technique  -rockerboard ant/post and laterally: weight shifting and static standing with intermittent UE support and tactile and VCs for technique  -on compliant surface (red balance beam) and up to mod  A: horizontal/vertical head turns x 10; forward step ups x 5 bil                        PT Short Term Goals - 06/10/15 1657    PT SHORT TERM GOAL #1   Title Patient demonstrates understanding of initial HEP (Target Date: 07/07/2015)   Time 1   Period Months   Status On-going   PT SHORT TERM GOAL #2   Title Patient reaches 7" anteriorly, across midline, laterally & to ground safely with no balance losses. (Target Date: 07/07/2015)   Time 1   Period Months   Status On-going   PT SHORT TERM GOAL #3   Title Patient ambulates with straight cane with head turns to scan environment without balance losses.  (Target Date:  07/07/2015)   Time 1   Period Months   Status On-going   PT SHORT TERM GOAL #4   Title Patient ambulates 400' with straight cane with supervision.  (Target Date: 07/07/2015)   Time 1   Period Months   Status On-going   PT SHORT TERM GOAL #5   Title Patient negotiates ramps & curbs with straight cane with min guard assist.  (Target Date: 07/07/2015)   Time 1   Period Months   Status On-going           PT Long Term Goals - 06/10/15 1657    PT LONG TERM GOAL #1   Title Patient demonstrates & verbalizes understanding of ongoing HEP / fitness plan. (Target Date: 08/05/2015)   Time 2   Period Months   Status On-going   PT LONG TERM GOAL #2   Title Berg Balance >45/56 to indicate lower fall risk. (Target Date: 08/05/2015)   Time 2   Period Months   Status On-going   PT LONG TERM GOAL #3   Title Functional Gait Assessment >/= 12/30 to indicate lower fall risk. (Target Date: 08/05/2015)   Time 2   Period Months   Status On-going   PT LONG TERM GOAL #4   Title Patient verbalizes fall prevention strategies. (Target Date: 08/05/2015)   Time 2   Status On-going   PT LONG TERM GOAL #5   Title ABC scale improved >10% to indicate greater confidence with balance activites. (Target Date: 08/05/2015)   Time 2   Period Months   Status On-going   PT LONG TERM GOAL #6   Title Patient ambulates 500' with straight cane outside including grass, ramps & curbs modified independent for safe community mobility. (Target Date: 08/05/2015)   Time 2   Period Months   Status On-going               Plan - 06/17/15 1138    Clinical Impression Statement Pt needed 2 seated rest breaks due to fatigue; vitals stable.  Pt continues to demonstrate difficulty wtih compliant surface activities and dynamic exercises.  Will continue to benefit from PT to maximize function.   PT Next Visit Plan Provide fall prevention handout. Dynamic gait/balance activities.    PT Home Exercise Plan Balance/stretching HEP.    Consulted and Agree with Plan of Care Patient        Problem List Patient Active Problem List   Diagnosis Date Noted  . Chronic systolic heart failure ([place])   . S/P CABG x 2   . Chronic kidney disease (CKD)   . Fatigue 11/11/2014  . NSVT (nonsustained ventricular tachycardia) ([place]) 11/11/2014  . LV  dysfunction 07/23/2014  . Hyperlipidemia 07/27/2013  . Dizzy spells 10/24/2011  . CAD (coronary artery disease) 03/30/2011  . PVC's (premature ventricular contractions) 03/30/2011  . ANEMIA, IRON DEFICIENCY 03/12/2008  . GERD 03/12/2008   Laureen Abrahams, PT, DPT 06/17/2015 11:40 AM  [facility] [address] [place], [place], [address] Phone: [phone]   Fax:  [phone]  Name: JEVIN SAILORS MRN: [id number] Date of Birth: Sep 06, 1926

## 2015-06-20 ENCOUNTER — Telehealth: Payer: Self-pay | Admitting: Physical Therapy

## 2015-06-20 ENCOUNTER — Ambulatory Visit: Payer: PPO | Admitting: Physical Therapy

## 2015-06-20 DIAGNOSIS — R269 Unspecified abnormalities of gait and mobility: Secondary | ICD-10-CM

## 2015-06-20 DIAGNOSIS — R2681 Unsteadiness on feet: Secondary | ICD-10-CM

## 2015-06-20 DIAGNOSIS — W19XXXS Unspecified fall, sequela: Secondary | ICD-10-CM

## 2015-06-20 NOTE — Therapy (Signed)
[facility] [address] [place], [place], [address] Phone: [phone]   Fax:  [phone]  Physical Therapy Treatment  Patient Details  Name: Gary Gonzalez MRN: [id number] Date of Birth: [birth year] Referring Provider: [name], MD  Encounter Date: 06/20/2015      PT End of Session - 06/20/15 1238    Visit Number 5   Number of Visits 18   Date for PT Re-Evaluation 08/05/15   Authorization Type Medicare G-code & progress note every 10th visit   PT Start Time 1106   PT Stop Time 1151   PT Time Calculation (min) 45 min   Equipment Utilized During Treatment Gait belt   Activity Tolerance Patient tolerated treatment well   Behavior During Therapy [facility] for tasks assessed/performed      Past Medical History  Diagnosis Date  . Hyperlipidemia   . Coronary artery disease     a. s/p 2v CABG (seq LIMA to Diag and LAD, SVG to OM) on 05/15/2001.  Marland Kitchen Hypothyroidism   . PMR (polymyalgia rheumatica) (HCC)   . NSVT (nonsustained ventricular tachycardia) ([place])   . PVC's (premature ventricular contractions)   . Sleep apnea     "wife says I do" (11/18/2014)  . Anemia   . Arthritis     "touch in the fingers" (11/17/2014)  . Fibromyalgia     "a long time ago" (11/17/2014)  . History of gout   . Melanoma of back ([place])   . CKD (chronic kidney disease), stage III   . LBBB (left bundle branch block)   . Chronic combined systolic and diastolic CHF (congestive heart failure) ([place])     a. 11/17/14 showed EF 25-30%, suboptimal image quality, diffuse hypokinesis worse in the inferior wall, mild to moderate MR. New drop EF compared to 2012 but cath was stable.    Past Surgical History  Procedure Laterality Date  . Coronary artery bypass graft  05/15/2001    x3 sequential LIMA to diag and LAD, SVG to OM   . Cardiac catheterization  04/28/2001  . Melanoma excision  2001    off of back  . Cataract extraction w/ intraocular lens  implant,  bilateral Bilateral ~ 2010  . Axillary lymph node dissection Bilateral 2001  . Cardiac catheterization N/A 11/19/2014    Procedure: Right/Left Heart Cath and Coronary/Graft Angiography;  Surgeon: Jettie Booze, MD;  Location: [facility];  Service: Cardiovascular;  Laterality: N/A;    There were no vitals filed for this visit.  Visit Diagnosis:  Abnormality of gait  Unsteadiness  Falls, sequela      Subjective Assessment - 06/20/15 1108    Subjective Pt reports no falls, no significant changes. Pt reports home exercises are going well. Has been performing HEP daily but perceived no significant improvement yet. Denies dizziness.   Pertinent History Pt likes to be called "Gary Gonzalez".  PMH:  CHF, CAD w/ CABG 2003, PVCs, LBBB, polymyalgia, fibromylagia, OA, gout, CKD stage III   Patient Stated Goals Walk without a cane.   Currently in Pain? No/denies                Vestibular Assessment - 06/20/15 0001    Vestibular Assessment   General Observation Gait instability. Noted direction-changing nystagmus when attempting to assess VOR. Pt perceives no significant changes since DC'ed home from hospital in 10/2014. Noted no acute abnormalities on CT of head in 01/2015.   Symptom Behavior   Type of Dizziness Imbalance  Frequency of Dizziness "whenever I'm walking"   Duration of Dizziness until pt stops walking   Aggravating Factors Activity in general   Occulomotor Exam   Occulomotor Alignment Normal  however, noted L pupil more dilated than R   Spontaneous Absent   Gaze-induced Direction changing nystagmus   Smooth Pursuits Saccades   Saccades Dysmetria   Comment Hypermetric saccades from R to midline. Convergence: impaired and noted ? nystagmus when assessing convergence.   Vestibulo-Occular Reflex   VOR 1 Head Only (x 1 viewing) Pt unable to perform due to increased horizontal, direction-changing nystagmus with head turns   VOR Cancellation Corrective saccades                  [facility] Adult PT Treatment/Exercise - 06/20/15 0001    Ambulation/Gait   Ambulation/Gait Yes   Ambulation/Gait Assistance 4: Min guard;4: Min assist   Ambulation/Gait Assistance Details x200' over unlevel, paved surfaces; remainder of gait trials over level, indoor surfaces. Min guard-min A required due to frequent lateral LOB (to L > R side) when turning head, turning body, walking through thresholds (doorway), and over inclined/delined surfaces.   Ambulation Distance (Feet) 450 Feet   Assistive device Straight cane  quad tip   Gait Pattern Decreased stride length;Decreased arm swing - left;Step-through pattern;Right foot flat;Left foot flat;Right flexed knee in stance;Left flexed knee in stance   Ambulation Surface Level;Unlevel;Indoor;Outdoor;Paved   Gait Comments Educated pt on compensatory strategies for impaired VOR during gait; emphasized turning eyes/head then body during gait with effective return demo from pt.              Balance Exercises - 06/20/15 1236    Balance Exercises: Standing   Standing Eyes Opened Narrow base of support (BOS);Solid surface;30 secs  (S) require due to postural sway   Standing Eyes Closed Wide (BOA);Foam/compliant surface;Solid surface;30 secs;2 reps  2 x30 sec EO then EC with (S)   Tandem Stance Eyes open;2 reps  Attempted semi-tandem x3 trials with LOB to R           PT Education - 06/20/15 1213    Education provided Yes   Education Details Explained vestibular/oculomotor assessment findings, possible implications. Recommended use of RW due to gait instability with SPC during this session.   Person(s) Educated Patient   Methods Explanation   Comprehension Verbalized understanding          PT Short Term Goals - 06/10/15 1657    PT SHORT TERM GOAL #1   Title Patient demonstrates understanding of initial HEP (Target Date: 07/07/2015)   Time 1   Period Months   Status On-going   PT SHORT TERM GOAL #2   Title  Patient reaches 7" anteriorly, across midline, laterally & to ground safely with no balance losses. (Target Date: 07/07/2015)   Time 1   Period Months   Status On-going   PT SHORT TERM GOAL #3   Title Patient ambulates with straight cane with head turns to scan environment without balance losses.  (Target Date: 07/07/2015)   Time 1   Period Months   Status On-going   PT SHORT TERM GOAL #4   Title Patient ambulates 400' with straight cane with supervision.  (Target Date: 07/07/2015)   Time 1   Period Months   Status On-going   PT SHORT TERM GOAL #5   Title Patient negotiates ramps & curbs with straight cane with min guard assist.  (Target Date: 07/07/2015)   Time 1   Period  Months   Status On-going           PT Long Term Goals - 06/10/15 1657    PT LONG TERM GOAL #1   Title Patient demonstrates & verbalizes understanding of ongoing HEP / fitness plan. (Target Date: 08/05/2015)   Time 2   Period Months   Status On-going   PT LONG TERM GOAL #2   Title Berg Balance >45/56 to indicate lower fall risk. (Target Date: 08/05/2015)   Time 2   Period Months   Status On-going   PT LONG TERM GOAL #3   Title Functional Gait Assessment >/= 12/30 to indicate lower fall risk. (Target Date: 08/05/2015)   Time 2   Period Months   Status On-going   PT LONG TERM GOAL #4   Title Patient verbalizes fall prevention strategies. (Target Date: 08/05/2015)   Time 2   Status On-going   PT LONG TERM GOAL #5   Title ABC scale improved >10% to indicate greater confidence with balance activites. (Target Date: 08/05/2015)   Time 2   Period Months   Status On-going   PT LONG TERM GOAL #6   Title Patient ambulates 500' with straight cane outside including grass, ramps & curbs modified independent for safe community mobility. (Target Date: 08/05/2015)   Time 2   Period Months   Status On-going               Plan - 06/20/15 1239    Clinical Impression Statement Session continued to focus on increasing  postural/gait stability. Due to noted nystagmus during this session, performed vestibular and oculomotor assessment, which revealed the following: direction-changing nystagmus; impaired VOR cancellation; impaired convergence; dilation of L pupil; impaired oculomotor coordination. Based on said findings, unable to rule out central pathology. The patient reports feeling no different than he has since being discharged from hospital 10/2014. Noted negative CT of head on 01/30/16. Sent in-basket message to PCP to notify of said findings. Also, educated pt on compensatory strategies for impairments during gait.   Pt will benefit from skilled therapeutic intervention in order to improve on the following deficits Abnormal gait;Cardiopulmonary status limiting activity;Decreased activity tolerance;Decreased balance;Decreased endurance;Decreased knowledge of use of DME;Decreased mobility;Decreased safety awareness;Decreased strength;Impaired flexibility;Postural dysfunction   Rehab Potential Good   PT Frequency 2x / week   PT Duration Other (comment)  9 weeks (60 days)   PT Treatment/Interventions ADLs/Self Care Home Management;DME Instruction;Gait training;Functional mobility training;Stair training;Therapeutic activities;Therapeutic exercise;Balance training;Neuromuscular re-education;Patient/family education;Vestibular   PT Next Visit Plan Continue dynamic gait/balance training. Provide fall prevention handout.   Recommended Other Services Follow up on in-basket message sent to Dr. Joylene Draft  on 2/20.   Consulted and Agree with Plan of Care Patient        Problem List Patient Active Problem List   Diagnosis Date Noted  . Chronic systolic heart failure ([place])   . S/P CABG x 2   . Chronic kidney disease (CKD)   . Fatigue 11/11/2014  . NSVT (nonsustained ventricular tachycardia) ([place]) 11/11/2014  . LV dysfunction 07/23/2014  . Hyperlipidemia 07/27/2013  . Dizzy spells 10/24/2011  . CAD (coronary artery  disease) 03/30/2011  . PVC's (premature ventricular contractions) 03/30/2011  . ANEMIA, IRON DEFICIENCY 03/12/2008  . GERD 03/12/2008    Billie Ruddy, PT, DPT [facility] [address] [place], [place], [address] Phone: [phone]   Fax:  [phone] 06/20/2015, 12:57 PM  Name: KWENTIN SINAGRA MRN: [id number] Date of Birth: 1926/09/01

## 2015-06-22 ENCOUNTER — Encounter: Payer: Self-pay | Admitting: Physical Therapy

## 2015-06-22 ENCOUNTER — Ambulatory Visit: Payer: PPO | Admitting: Physical Therapy

## 2015-06-22 DIAGNOSIS — W19XXXS Unspecified fall, sequela: Secondary | ICD-10-CM

## 2015-06-22 DIAGNOSIS — R2681 Unsteadiness on feet: Secondary | ICD-10-CM

## 2015-06-22 DIAGNOSIS — R6889 Other general symptoms and signs: Secondary | ICD-10-CM

## 2015-06-22 DIAGNOSIS — R269 Unspecified abnormalities of gait and mobility: Secondary | ICD-10-CM | POA: Diagnosis not present

## 2015-06-22 DIAGNOSIS — R2689 Other abnormalities of gait and mobility: Secondary | ICD-10-CM

## 2015-06-23 NOTE — Therapy (Signed)
[facility] [address] [place], [place], [address] Phone: [phone]   Fax:  [phone]  Physical Therapy Treatment  Patient Details  Name: Gary Gonzalez MRN: [id number] Date of Birth: 1926-06-22 Referring Provider: [name], MD  Encounter Date: 06/22/2015      PT End of Session - 06/22/15 1145    Visit Number 6   Number of Visits 18   Date for PT Re-Evaluation 08/05/15   Authorization Type Medicare G-code & progress note every 10th visit   PT Start Time 1102   PT Stop Time 1145   PT Time Calculation (min) 43 min   Equipment Utilized During Treatment Gait belt   Activity Tolerance Patient tolerated treatment well   Behavior During Therapy [facility] for tasks assessed/performed      Past Medical History  Diagnosis Date  . Hyperlipidemia   . Coronary artery disease     a. s/p 2v CABG (seq LIMA to Diag and LAD, SVG to OM) on 05/15/2001.  Marland Kitchen Hypothyroidism   . PMR (polymyalgia rheumatica) (HCC)   . NSVT (nonsustained ventricular tachycardia) ([place])   . PVC's (premature ventricular contractions)   . Sleep apnea     "wife says I do" (11/18/2014)  . Anemia   . Arthritis     "touch in the fingers" (11/17/2014)  . Fibromyalgia     "a long time ago" (11/17/2014)  . History of gout   . Melanoma of back ([place])   . CKD (chronic kidney disease), stage III   . LBBB (left bundle branch block)   . Chronic combined systolic and diastolic CHF (congestive heart failure) ([place])     a. 11/17/14 showed EF 25-30%, suboptimal image quality, diffuse hypokinesis worse in the inferior wall, mild to moderate MR. New drop EF compared to 2012 but cath was stable.    Past Surgical History  Procedure Laterality Date  . Coronary artery bypass graft  05/15/2001    x3 sequential LIMA to diag and LAD, SVG to OM   . Cardiac catheterization  04/28/2001  . Melanoma excision  2001    off of back  . Cataract extraction w/ intraocular lens  implant,  bilateral Bilateral ~ 2010  . Axillary lymph node dissection Bilateral 2001  . Cardiac catheterization N/A 11/19/2014    Procedure: Right/Left Heart Cath and Coronary/Graft Angiography;  Surgeon: Jettie Booze, MD;  Location: [facility];  Service: Cardiovascular;  Laterality: N/A;    There were no vitals filed for this visit.  Visit Diagnosis:  Abnormality of gait  Unsteadiness  Falls, sequela  Balance problems  Decreased functional activity tolerance      Subjective Assessment - 06/22/15 1104    Subjective He almost fell forward when putting shoes on this morning but was able to catch himself. Reports no dizziness associated with this.    Currently in Pain? No/denies            [facility] PT Assessment - 06/22/15 1100    Standardized Balance Assessment   Balance Master Testing Sensory Organization Test   Balance Master Testing    Results Pt was tested on SOT today, 06/22/2015 & compared to previous SOT on 05/12/2009 which will appear in paranthesis after each section of current test. Composite score 28 (71) which is <50% of age norm; Sensory Analysis showed somatosensory & visual input within age norms, Vestibular 2% (65% above age norm) and preference 2% (95% which is above age norms); Strategy Analysis showed  no hip strategy elicited in "falls" (prior test used both ankle & hip strategies with no falls in any of 6 conditions); Weight distribution right of center of gravity.    PT used printout to explain results to patient.                      [facility] Adult PT Treatment/Exercise - 06/22/15 1100    Standardized Balance Assessment   Standardized Balance Assessment Balance Master Testing             Balance Exercises - 06/22/15 1100    Balance Exercises: Standing   Standing Eyes Opened Narrow base of support (BOS);Head turns;Solid surface;Other reps (comment);Wide (BOA);Foam/compliant surface  10 reps 4 directions; narrow base EO, wide base  compliant   Standing Eyes Closed Wide (BOA);Solid surface;Head turns  10 reps 4 direction head turns EC           PT Education - 06/22/15 1145    Education provided Yes   Education Details Sensory Organization Test results & comparison to test 6 years ago   Person(s) Educated Patient   Methods Explanation;Demonstration;Other (comment)  print out   Comprehension Verbalized understanding          PT Short Term Goals - 06/10/15 1657    PT SHORT TERM GOAL #1   Title Patient demonstrates understanding of initial HEP (Target Date: 07/07/2015)   Time 1   Period Months   Status On-going   PT SHORT TERM GOAL #2   Title Patient reaches 7" anteriorly, across midline, laterally & to ground safely with no balance losses. (Target Date: 07/07/2015)   Time 1   Period Months   Status On-going   PT SHORT TERM GOAL #3   Title Patient ambulates with straight cane with head turns to scan environment without balance losses.  (Target Date: 07/07/2015)   Time 1   Period Months   Status On-going   PT SHORT TERM GOAL #4   Title Patient ambulates 400' with straight cane with supervision.  (Target Date: 07/07/2015)   Time 1   Period Months   Status On-going   PT SHORT TERM GOAL #5   Title Patient negotiates ramps & curbs with straight cane with min guard assist.  (Target Date: 07/07/2015)   Time 1   Period Months   Status On-going           PT Long Term Goals - 06/10/15 1657    PT LONG TERM GOAL #1   Title Patient demonstrates & verbalizes understanding of ongoing HEP / fitness plan. (Target Date: 08/05/2015)   Time 2   Period Months   Status On-going   PT LONG TERM GOAL #2   Title Berg Balance >45/56 to indicate lower fall risk. (Target Date: 08/05/2015)   Time 2   Period Months   Status On-going   PT LONG TERM GOAL #3   Title Functional Gait Assessment >/= 12/30 to indicate lower fall risk. (Target Date: 08/05/2015)   Time 2   Period Months   Status On-going   PT LONG TERM GOAL #4   Title  Patient verbalizes fall prevention strategies. (Target Date: 08/05/2015)   Time 2   Status On-going   PT LONG TERM GOAL #5   Title ABC scale improved >10% to indicate greater confidence with balance activites. (Target Date: 08/05/2015)   Time 2   Period Months   Status On-going   PT LONG TERM GOAL #6   Title Patient  ambulates 500' with straight cane outside including grass, ramps & curbs modified independent for safe community mobility. (Target Date: 08/05/2015)   Time 2   Period Months   Status On-going               Plan - 06/22/15 1145    Clinical Impression Statement Sensory Organization Test revealed a significant drop in balance with lower composite score, minimal input from vestibular system and minimal ability to elicit proper preference system in various condition. He does not use hip strategy in fall situations and maintains weight right of center of gravity. Patient may benefit from exercises to facilitate vestibular system & hip strategy.    Pt will benefit from skilled therapeutic intervention in order to improve on the following deficits Abnormal gait;Cardiopulmonary status limiting activity;Decreased activity tolerance;Decreased balance;Decreased endurance;Decreased knowledge of use of DME;Decreased mobility;Decreased safety awareness;Decreased strength;Impaired flexibility;Postural dysfunction   Rehab Potential Good   PT Frequency 2x / week   PT Duration Other (comment)  9 weeks (60 days)   PT Treatment/Interventions ADLs/Self Care Home Management;DME Instruction;Gait training;Functional mobility training;Stair training;Therapeutic activities;Therapeutic exercise;Balance training;Neuromuscular re-education;Patient/family education;Vestibular   PT Next Visit Plan Review corner exercises with head turns and when safe issue as HEP, Continue dynamic gait/balance training. Provide fall prevention handout.   Consulted and Agree with Plan of Care Patient        Problem  List Patient Active Problem List   Diagnosis Date Noted  . Chronic systolic heart failure ([place])   . S/P CABG x 2   . Chronic kidney disease (CKD)   . Fatigue 11/11/2014  . NSVT (nonsustained ventricular tachycardia) ([place]) 11/11/2014  . LV dysfunction 07/23/2014  . Hyperlipidemia 07/27/2013  . Dizzy spells 10/24/2011  . CAD (coronary artery disease) 03/30/2011  . PVC's (premature ventricular contractions) 03/30/2011  . ANEMIA, IRON DEFICIENCY 03/12/2008  . GERD 03/12/2008    Joss Mcdill PT, DPT 06/23/2015, 11:13 AM  [facility] [address] [place], [place], [address] Phone: [phone]   Fax:  [phone]  Name: Gary Gonzalez MRN: [id number] Date of Birth: Sep 27, 1926

## 2015-06-27 ENCOUNTER — Other Ambulatory Visit: Payer: Self-pay | Admitting: Internal Medicine

## 2015-06-27 ENCOUNTER — Encounter: Payer: Self-pay | Admitting: Physical Therapy

## 2015-06-27 ENCOUNTER — Ambulatory Visit: Payer: PPO | Admitting: Physical Therapy

## 2015-06-27 DIAGNOSIS — R296 Repeated falls: Secondary | ICD-10-CM

## 2015-06-27 DIAGNOSIS — R6889 Other general symptoms and signs: Secondary | ICD-10-CM

## 2015-06-27 DIAGNOSIS — R269 Unspecified abnormalities of gait and mobility: Secondary | ICD-10-CM | POA: Diagnosis not present

## 2015-06-27 DIAGNOSIS — R2681 Unsteadiness on feet: Secondary | ICD-10-CM

## 2015-06-27 DIAGNOSIS — R2689 Other abnormalities of gait and mobility: Secondary | ICD-10-CM

## 2015-06-27 DIAGNOSIS — W19XXXS Unspecified fall, sequela: Secondary | ICD-10-CM

## 2015-06-27 NOTE — Therapy (Signed)
[facility] [address] [place], [place], [address] Phone: [phone]   Fax:  [phone]  Physical Therapy Treatment  Patient Details  Name: Gary Gonzalez MRN: [id number] Date of Birth: 1927-03-03 Referring Provider: [name], MD  Encounter Date: 06/27/2015      PT End of Session - 06/27/15 1328    Visit Number 7   Number of Visits 18   Date for PT Re-Evaluation 08/05/15   Authorization Type Medicare G-code & progress note every 10th visit   PT Start Time 1233   PT Stop Time 1320   PT Time Calculation (min) 47 min   Equipment Utilized During Treatment Gait belt   Activity Tolerance Patient tolerated treatment well   Behavior During Therapy [facility] [address] for tasks assessed/performed      Past Medical History  Diagnosis Date  . Hyperlipidemia   . Coronary artery disease     a. s/p 2v CABG (seq LIMA to Diag and LAD, SVG to OM) on 05/15/2001.  Marland Kitchen Hypothyroidism   . PMR (polymyalgia rheumatica) (HCC)   . NSVT (nonsustained ventricular tachycardia) ([place])   . PVC's (premature ventricular contractions)   . Sleep apnea     "wife says I do" (11/18/2014)  . Anemia   . Arthritis     "touch in the fingers" (11/17/2014)  . Fibromyalgia     "a long time ago" (11/17/2014)  . History of gout   . Melanoma of back ([place])   . CKD (chronic kidney disease), stage III   . LBBB (left bundle branch block)   . Chronic combined systolic and diastolic CHF (congestive heart failure) ([place])     a. 11/17/14 showed EF 25-30%, suboptimal image quality, diffuse hypokinesis worse in the inferior wall, mild to moderate MR. New drop EF compared to 2012 but cath was stable.    Past Surgical History  Procedure Laterality Date  . Coronary artery bypass graft  05/15/2001    x3 sequential LIMA to diag and LAD, SVG to OM   . Cardiac catheterization  04/28/2001  . Melanoma excision  2001    off of back  . Cataract extraction w/ intraocular lens  implant,  bilateral Bilateral ~ 2010  . Axillary lymph node dissection Bilateral 2001  . Cardiac catheterization N/A 11/19/2014    Procedure: Right/Left Heart Cath and Coronary/Graft Angiography;  Surgeon: Jettie Booze, MD;  Location: [facility];  Service: Cardiovascular;  Laterality: N/A;    There were no vitals filed for this visit.  Visit Diagnosis:  Abnormality of gait  Unsteadiness  Balance problems  Decreased functional activity tolerance  Falls, sequela                       [facility] Adult PT Treatment/Exercise - 06/27/15 1230    Ambulation/Gait   Ambulation/Gait Yes   Ambulation/Gait Assistance 4: Min assist;4: Min guard   Ambulation/Gait Assistance Details cues on posture/ not staring at floor only glancing.   Ambulation Distance (Feet) 300 Feet  300' X 2   Assistive device Straight cane  quad tip   Gait Pattern Decreased stride length;Decreased arm swing - left;Step-through pattern;Right foot flat;Left foot flat;Right flexed knee in stance;Left flexed knee in stance   Ambulation Surface Indoor;Level   Neuro Re-ed    Neuro Re-ed Details  posture standing at doorframe reaching overhead & posteriorly alternating UEs then BUEs 2 reps each, hold 2 deep breaths.  Balance Exercises - 06/27/15 1230    Balance Exercises: Standing   Standing Eyes Opened Narrow base of support (BOS);Head turns;Solid surface;Other reps (comment);Wide (BOA);Foam/compliant surface   Standing Eyes Closed Wide (BOA);Solid surface;Head turns   Rockerboard Anterior/posterior;Lateral;Head turns;EO  weight shifts with stabilization   Other Standing Exercises Stepping to 4 corners of square - alternating LEs with BUE support holding PTs hands high           PT Education - 06/27/15 1325    Education provided Yes   Education Details corner vestibular exercises head turns, posture stretches at [employer]) Educated Patient   Methods  Explanation;Demonstration;Tactile cues;Verbal cues;Handout   Comprehension Verbalized understanding;Returned demonstration;Verbal cues required;Tactile cues required          PT Short Term Goals - 06/10/15 1657    PT SHORT TERM GOAL #1   Title Patient demonstrates understanding of initial HEP (Target Date: 07/07/2015)   Time 1   Period Months   Status On-going   PT SHORT TERM GOAL #2   Title Patient reaches 7" anteriorly, across midline, laterally & to ground safely with no balance losses. (Target Date: 07/07/2015)   Time 1   Period Months   Status On-going   PT SHORT TERM GOAL #3   Title Patient ambulates with straight cane with head turns to scan environment without balance losses.  (Target Date: 07/07/2015)   Time 1   Period Months   Status On-going   PT SHORT TERM GOAL #4   Title Patient ambulates 400' with straight cane with supervision.  (Target Date: 07/07/2015)   Time 1   Period Months   Status On-going   PT SHORT TERM GOAL #5   Title Patient negotiates ramps & curbs with straight cane with min guard assist.  (Target Date: 07/07/2015)   Time 1   Period Months   Status On-going           PT Long Term Goals - 06/10/15 1657    PT LONG TERM GOAL #1   Title Patient demonstrates & verbalizes understanding of ongoing HEP / fitness plan. (Target Date: 08/05/2015)   Time 2   Period Months   Status On-going   PT LONG TERM GOAL #2   Title Berg Balance >45/56 to indicate lower fall risk. (Target Date: 08/05/2015)   Time 2   Period Months   Status On-going   PT LONG TERM GOAL #3   Title Functional Gait Assessment >/= 12/30 to indicate lower fall risk. (Target Date: 08/05/2015)   Time 2   Period Months   Status On-going   PT LONG TERM GOAL #4   Title Patient verbalizes fall prevention strategies. (Target Date: 08/05/2015)   Time 2   Status On-going   PT LONG TERM GOAL #5   Title ABC scale improved >10% to indicate greater confidence with balance activites. (Target Date: 08/05/2015)    Time 2   Period Months   Status On-going   PT LONG TERM GOAL #6   Title Patient ambulates 500' with straight cane outside including grass, ramps & curbs modified independent for safe community mobility. (Target Date: 08/05/2015)   Time 2   Period Months   Status On-going               Plan - 06/27/15 1329    Clinical Impression Statement Patient appears safe to perform vestibular exercises in corner. He improved balance reactions with tactile, visual & verbal cues for righting reactions & posture. Patient  fatigued during session but rest for 10 minutes in lobby after session was able to recover.    Pt will benefit from skilled therapeutic intervention in order to improve on the following deficits Abnormal gait;Cardiopulmonary status limiting activity;Decreased activity tolerance;Decreased balance;Decreased endurance;Decreased knowledge of use of DME;Decreased mobility;Decreased safety awareness;Decreased strength;Impaired flexibility;Postural dysfunction   Rehab Potential Good   PT Frequency 2x / week   PT Duration Other (comment)  9 weeks (60 days)   PT Treatment/Interventions ADLs/Self Care Home Management;DME Instruction;Gait training;Functional mobility training;Stair training;Therapeutic activities;Therapeutic exercise;Balance training;Neuromuscular re-education;Patient/family education;Vestibular   PT Next Visit Plan Review corner exercises with head turns and when safe issue as HEP, Continue dynamic gait/balance training. Provide fall prevention handout.   Consulted and Agree with Plan of Care Patient        Problem List Patient Active Problem List   Diagnosis Date Noted  . Chronic systolic heart failure ([place])   . S/P CABG x 2   . Chronic kidney disease (CKD)   . Fatigue 11/11/2014  . NSVT (nonsustained ventricular tachycardia) ([place]) 11/11/2014  . LV dysfunction 07/23/2014  . Hyperlipidemia 07/27/2013  . Dizzy spells 10/24/2011  . CAD (coronary artery disease)  03/30/2011  . PVC's (premature ventricular contractions) 03/30/2011  . ANEMIA, IRON DEFICIENCY 03/12/2008  . GERD 03/12/2008    Azlin Zilberman PT, DPT 06/27/2015, 1:40 PM  [facility] [address] [place], [place], [address] Phone: [phone]   Fax:  [phone]  Name: Gary Gonzalez MRN: [id number] Date of Birth: 1927/02/17

## 2015-06-27 NOTE — Patient Instructions (Signed)
Feet Together, Head Motion - Eyes Open    With eyes open, feet together, move head slowly: up/down, right/left, up-right/down-left and up-left/down-right. Do 10 times for each of 4 head movements.   Copyright  VHI. All rights reserved.  Feet Apart, Head Motion - Eyes Closed    With eyes closed and feet shoulder width apart, move head slowly, up/down, right/left, up-right/down-left and up-left/down-right. Do 10 times for each of 4 head movements.  Copyright  VHI. All rights reserved.  Feet Apart (Compliant Surface) Head Motion - Eyes Open    With eyes open, standing on compliant surface: ____pillow or foam____, feet shoulder width apart, move head slowly:up/down, right/left, up-right/down-left and up-left/down-right. Do 10 times for each of 4 head movements.   Copyright  VHI. All rights reserved.

## 2015-06-29 ENCOUNTER — Ambulatory Visit: Payer: PPO | Attending: Internal Medicine | Admitting: *Deleted

## 2015-06-29 ENCOUNTER — Encounter: Payer: Self-pay | Admitting: *Deleted

## 2015-06-29 VITALS — HR 77

## 2015-06-29 DIAGNOSIS — R269 Unspecified abnormalities of gait and mobility: Secondary | ICD-10-CM | POA: Insufficient documentation

## 2015-06-29 DIAGNOSIS — R29818 Other symptoms and signs involving the nervous system: Secondary | ICD-10-CM | POA: Diagnosis not present

## 2015-06-29 DIAGNOSIS — R6889 Other general symptoms and signs: Secondary | ICD-10-CM

## 2015-06-29 DIAGNOSIS — R2681 Unsteadiness on feet: Secondary | ICD-10-CM | POA: Insufficient documentation

## 2015-06-29 DIAGNOSIS — W19XXXS Unspecified fall, sequela: Secondary | ICD-10-CM | POA: Insufficient documentation

## 2015-06-29 NOTE — Therapy (Signed)
[facility] [address] [place], [place], [address] Phone: [phone]   Fax:  [phone]  Physical Therapy Treatment  Patient Details  Name: Gary Gonzalez MRN: [id number] Date of Birth: 06-16-1926 Referring Provider: [name], MD  Encounter Date: 06/29/2015      PT End of Session - 06/29/15 1214    Visit Number 8   Number of Visits 18   Date for PT Re-Evaluation 08/05/15   Authorization Type Medicare G-code & progress note every 10th visit   PT Start Time 1105   PT Stop Time 1145   PT Time Calculation (min) 40 min   Activity Tolerance Patient tolerated treatment well   Behavior During Therapy [facility] for tasks assessed/performed      Past Medical History  Diagnosis Date  . Hyperlipidemia   . Coronary artery disease     a. s/p 2v CABG (seq LIMA to Diag and LAD, SVG to OM) on 05/15/2001.  Marland Kitchen Hypothyroidism   . PMR (polymyalgia rheumatica) (HCC)   . NSVT (nonsustained ventricular tachycardia) ([place])   . PVC's (premature ventricular contractions)   . Sleep apnea     "wife says I do" (11/18/2014)  . Anemia   . Arthritis     "touch in the fingers" (11/17/2014)  . Fibromyalgia     "a long time ago" (11/17/2014)  . History of gout   . Melanoma of back ([place])   . CKD (chronic kidney disease), stage III   . LBBB (left bundle branch block)   . Chronic combined systolic and diastolic CHF (congestive heart failure) ([place])     a. 11/17/14 showed EF 25-30%, suboptimal image quality, diffuse hypokinesis worse in the inferior wall, mild to moderate MR. New drop EF compared to 2012 but cath was stable.    Past Surgical History  Procedure Laterality Date  . Coronary artery bypass graft  05/15/2001    x3 sequential LIMA to diag and LAD, SVG to OM   . Cardiac catheterization  04/28/2001  . Melanoma excision  2001    off of back  . Cataract extraction w/ intraocular lens  implant, bilateral Bilateral ~ 2010  . Axillary lymph  node dissection Bilateral 2001  . Cardiac catheterization N/A 11/19/2014    Procedure: Right/Left Heart Cath and Coronary/Graft Angiography;  Surgeon: Jettie Booze, MD;  Location: [facility];  Service: Cardiovascular;  Laterality: N/A;    Filed Vitals:   06/29/15 1125 06/29/15 1129  Pulse: 71 77  SpO2: 96% 95%    Visit Diagnosis:  Decreased functional activity tolerance  Unsteadiness      Subjective Assessment - 06/29/15 1106    Subjective Performed HEP given 2/10, 2/27 and has no questions but is feeling tired after doing HEP and feels more "wobbly".  Reports having a routine of going to the gym and riding a stationary bike multiple times/week but hasn't gone back  since last hospitalization in July.                           Pertinent History Pt likes to be called "Gary Gonzalez".  PMH:  CHF, CAD w/ CABG 2003, PVCs, LBBB, polymyalgia, fibromylagia, OA, gout, CKD stage III   Patient Stated Goals Walk without a cane.   Currently in Pain? No/denies                         [facility] Adult PT  Treatment/Exercise - 06/29/15 0001    Knee/Hip Exercises: Machines for Strengthening   Other Machine sci fit stepper L=3.0 seat10 66min  Kept RPM around 70   Knee/Hip Exercises: Standing   Hip Flexion  SLR AROM 10x   Hip ADduction AROM;10 reps   Hip Abduction AROM;10 reps   Hip Extension AROM;10 reps                PT Education - 06/29/15 1211    Education provided Yes   Education Details Primary PT and PTA discussed benefits of returning to the gym gradually as to not over fatigue and increase fall risk.   Person(s) Educated Patient   Methods Explanation   Comprehension Verbalized understanding          PT Short Term Goals - 06/10/15 1657    PT SHORT TERM GOAL #1   Title Patient demonstrates understanding of initial HEP (Target Date: 07/07/2015)   Time 1   Period Months   Status On-going   PT SHORT TERM GOAL #2   Title Patient reaches 7" anteriorly,  across midline, laterally & to ground safely with no balance losses. (Target Date: 07/07/2015)   Time 1   Period Months   Status On-going   PT SHORT TERM GOAL #3   Title Patient ambulates with straight cane with head turns to scan environment without balance losses.  (Target Date: 07/07/2015)   Time 1   Period Months   Status On-going   PT SHORT TERM GOAL #4   Title Patient ambulates 400' with straight cane with supervision.  (Target Date: 07/07/2015)   Time 1   Period Months   Status On-going   PT SHORT TERM GOAL #5   Title Patient negotiates ramps & curbs with straight cane with min guard assist.  (Target Date: 07/07/2015)   Time 1   Period Months   Status On-going           PT Long Term Goals - 06/10/15 1657    PT LONG TERM GOAL #1   Title Patient demonstrates & verbalizes understanding of ongoing HEP / fitness plan. (Target Date: 08/05/2015)   Time 2   Period Months   Status On-going   PT LONG TERM GOAL #2   Title Berg Balance >45/56 to indicate lower fall risk. (Target Date: 08/05/2015)   Time 2   Period Months   Status On-going   PT LONG TERM GOAL #3   Title Functional Gait Assessment >/= 12/30 to indicate lower fall risk. (Target Date: 08/05/2015)   Time 2   Period Months   Status On-going   PT LONG TERM GOAL #4   Title Patient verbalizes fall prevention strategies. (Target Date: 08/05/2015)   Time 2   Status On-going   PT LONG TERM GOAL #5   Title ABC scale improved >10% to indicate greater confidence with balance activites. (Target Date: 08/05/2015)   Time 2   Period Months   Status On-going   PT LONG TERM GOAL #6   Title Patient ambulates 500' with straight cane outside including grass, ramps & curbs modified independent for safe community mobility. (Target Date: 08/05/2015)   Time 2   Period Months   Status On-going               Plan - 06/29/15 1214    Clinical Impression Statement Skilled session focused on cardio and LE strengthening.  Vitals were taken  before and during  activity on SCI Fit and were WNL.  Discussed with Primary PT and pt the option of graudually returning to the gym to work on cardio equipment and both were agreeable if pt followed PT recommendations.    Performed standing LE strengthening and pt reports exercises challenging and required seated rest at the end of session.  Progressing well with HEP comliance.                                                                              Pt will benefit from skilled therapeutic intervention in order to improve on the following deficits Abnormal gait;Cardiopulmonary status limiting activity;Decreased activity tolerance;Decreased balance;Decreased endurance;Decreased knowledge of use of DME;Decreased mobility;Decreased safety awareness;Decreased strength;Impaired flexibility;Postural dysfunction   Rehab Potential Good   PT Frequency 2x / week   PT Duration Other (comment)  9 weeks (60 days)   PT Treatment/Interventions ADLs/Self Care Home Management;DME Instruction;Gait training;Functional mobility training;Stair training;Therapeutic activities;Therapeutic exercise;Balance training;Neuromuscular re-education;Patient/family education;Vestibular   PT Next Visit Plan Review corner exercises with head turns and when safe issue as HEP, Continue dynamic gait/balance training. Provide fall prevention handout.   Consulted and Agree with Plan of Care Patient        Problem List Patient Active Problem List   Diagnosis Date Noted  . Chronic systolic heart failure ([place])   . S/P CABG x 2   . Chronic kidney disease (CKD)   . Fatigue 11/11/2014  . NSVT (nonsustained ventricular tachycardia) ([place]) 11/11/2014  . LV dysfunction 07/23/2014  . Hyperlipidemia 07/27/2013  . Dizzy spells 10/24/2011  . CAD (coronary artery disease) 03/30/2011  . PVC's (premature ventricular contractions) 03/30/2011  . ANEMIA, IRON DEFICIENCY 03/12/2008  . GERD 03/12/2008    Bjorn Loser, PTA  06/29/2015,  12:23 PM [facility] [address] [place], [place], [address] Phone: [phone]   Fax:  [phone]  Name: Gary Gonzalez MRN: [id number] Date of Birth: [birth year]

## 2015-07-04 ENCOUNTER — Ambulatory Visit: Payer: PPO | Admitting: Physical Therapy

## 2015-07-04 ENCOUNTER — Encounter: Payer: Self-pay | Admitting: Physical Therapy

## 2015-07-04 DIAGNOSIS — R2681 Unsteadiness on feet: Secondary | ICD-10-CM

## 2015-07-04 DIAGNOSIS — R6889 Other general symptoms and signs: Secondary | ICD-10-CM | POA: Diagnosis not present

## 2015-07-04 DIAGNOSIS — R269 Unspecified abnormalities of gait and mobility: Secondary | ICD-10-CM

## 2015-07-04 DIAGNOSIS — R2689 Other abnormalities of gait and mobility: Secondary | ICD-10-CM

## 2015-07-04 NOTE — Therapy (Signed)
[facility] [address] [place], [place], [address] Phone: [phone]   Fax:  [phone]  Physical Therapy Treatment  Patient Details  Name: Gary Gonzalez MRN: [id number] Date of Birth: 10-01-1926 Referring Provider: [name], MD  Encounter Date: 07/04/2015      PT End of Session - 07/04/15 1354    Visit Number 9   Number of Visits 18   Date for PT Re-Evaluation 08/05/15   Authorization Type Medicare G-code & progress note every 10th visit   PT Start Time 1228   PT Stop Time 1313   PT Time Calculation (min) 45 min   Equipment Utilized During Treatment Gait belt   Activity Tolerance Patient tolerated treatment well   Behavior During Therapy [facility] for tasks assessed/performed      Past Medical History  Diagnosis Date  . Hyperlipidemia   . Coronary artery disease     a. s/p 2v CABG (seq LIMA to Diag and LAD, SVG to OM) on 05/15/2001.  Marland Kitchen Hypothyroidism   . PMR (polymyalgia rheumatica) (HCC)   . NSVT (nonsustained ventricular tachycardia) ([facility])   . PVC's (premature ventricular contractions)   . Sleep apnea     "wife says I do" (11/18/2014)  . Anemia   . Arthritis     "touch in the fingers" (11/17/2014)  . Fibromyalgia     "a long time ago" (11/17/2014)  . History of gout   . Melanoma of back ([place])   . CKD (chronic kidney disease), stage III   . LBBB (left bundle branch block)   . Chronic combined systolic and diastolic CHF (congestive heart failure) ([place])     a. 11/17/14 showed EF 25-30%, suboptimal image quality, diffuse hypokinesis worse in the inferior wall, mild to moderate MR. New drop EF compared to 2012 but cath was stable.    Past Surgical History  Procedure Laterality Date  . Coronary artery bypass graft  05/15/2001    x3 sequential LIMA to diag and LAD, SVG to OM   . Cardiac catheterization  04/28/2001  . Melanoma excision  2001    off of back  . Cataract extraction w/ intraocular lens  implant,  bilateral Bilateral ~ 2010  . Axillary lymph node dissection Bilateral 2001  . Cardiac catheterization N/A 11/19/2014    Procedure: Right/Left Heart Cath and Coronary/Graft Angiography;  Surgeon: Jettie Booze, MD;  Location: [place] [facility];  Service: Cardiovascular;  Laterality: N/A;    There were no vitals filed for this visit.  Visit Diagnosis:  Decreased functional activity tolerance  Unsteadiness  Abnormality of gait  Balance problems      Subjective Assessment - 07/04/15 1232    Subjective Patient reports no falls. Reports having a routine of going to the gym and riding stationary bike multiple times/week but hasn't gone back yet.   Pertinent History Pt likes to be called "Gary Gonzalez".  PMH:  CHF, CAD w/ CABG 2003, PVCs, LBBB, polymyalgia, fibromylagia, OA, gout, CKD stage III   Patient Stated Goals Walk without a cane.   Currently in Pain? No/denies          [facility] Adult PT Treatment/Exercise - 07/04/15 1241    Ambulation/Gait   Ambulation/Gait Yes   Ambulation/Gait Assistance 4: Min assist;4: Min guard   Ambulation Distance (Feet) 420 Feet   Assistive device Straight cane   Gait Pattern Decreased stride length;Decreased arm swing - left;Step-through pattern;Right foot flat;Left foot flat;Right flexed knee in stance;Left flexed knee in  stance   Stairs Yes   Stairs Assistance 5: Supervision;4: Min guard   Stairs Assistance Details (indicate cue type and reason) Verbal cues for sequencing.   Stair Management Technique One rail Right;Alternating pattern;Forwards;With cane   Number of Stairs 4   Ramp 5: Supervision   Ramp Details (indicate cue type and reason) Supervision to min/guard with patient needing less guard as trials progressed. Verbal cues for sequencing.   Curb 5: Supervision   Curb Details (indicate cue type and reason) Supervision to min/guard with patient needing less guarding as trials progressed. Verbal cues for sequencing.   Neuro Re-ed    Neuro Re-ed  Details  // Bars on level surface standing eyes closed no UE support, eyes closed head shake, eyes closed head nod, eyes closed diagonals; on Airex pad eyes closed no movement, eyes closed head nod, eyes closed head shake, eyes closed diagonals, all with min/guard to minA to decrease fall risk and increase balance necessary for safe community ambulation.          PT Short Term Goals - 07/04/15 1357    PT SHORT TERM GOAL #1   Title Patient demonstrates understanding of initial HEP (Target Date: 07/07/2015)   Time 1   Period Months   Status On-going   PT SHORT TERM GOAL #2   Title Patient reaches 7" anteriorly, across midline, laterally & to ground safely with no balance losses. (Target Date: 07/07/2015)   Baseline Goal MET 07/04/15 with 8" or more in each direction with no LOB.   Time 1   Period Months   Status Achieved   PT SHORT TERM GOAL #3   Title Patient ambulates with straight cane with head turns to scan environment without balance losses.  (Target Date: 07/07/2015)   Baseline As of 07/04/15 patient ambulates with straight cane with head turns vertical and horizontal with no LOB but struggles with diagonals.   Time 1   Period Months   Status On-going   PT SHORT TERM GOAL #4   Title Patient ambulates 400' with straight cane with supervision.  (Target Date: 07/07/2015)   Baseline As of 07/04/15 patient could ambulate 400' with straight cane with min guard to supervision.   Time 1   Period Months   Status On-going   PT SHORT TERM GOAL #5   Title Patient negotiates ramps & curbs with straight cane with min guard assist.  (Target Date: 07/07/2015)   Baseline Goal MET 07/04/15 patient negotiated ramps and curbs with straight cain with min guard to supervision.   Time 1   Period Months   Status Achieved          PT Long Term Goals - 06/10/15 1657    PT LONG TERM GOAL #1   Title Patient demonstrates & verbalizes understanding of ongoing HEP / fitness plan. (Target Date: 08/05/2015)   Time 2    Period Months   Status On-going   PT LONG TERM GOAL #2   Title Berg Balance >45/56 to indicate lower fall risk. (Target Date: 08/05/2015)   Time 2   Period Months   Status On-going   PT LONG TERM GOAL #3   Title Functional Gait Assessment >/= 12/30 to indicate lower fall risk. (Target Date: 08/05/2015)   Time 2   Period Months   Status On-going   PT LONG TERM GOAL #4   Title Patient verbalizes fall prevention strategies. (Target Date: 08/05/2015)   Time 2   Status On-going   PT LONG TERM  GOAL #5   Title ABC scale improved >10% to indicate greater confidence with balance activites. (Target Date: 08/05/2015)   Time 2   Period Months   Status On-going   PT LONG TERM GOAL #6   Title Patient ambulates 500' with straight cane outside including grass, ramps & curbs modified independent for safe community mobility. (Target Date: 08/05/2015)   Time 2   Period Months   Status On-going          Plan - 07/04/15 1355    Clinical Impression Statement Skilled session addressing STGs for gait including barriers and balance training. Patient is able to ambulate further distances than previously and is becoming comfortable challenging balance. Patient achieved some goals and is making progress toward other goals.   Pt will benefit from skilled therapeutic intervention in order to improve on the following deficits Abnormal gait;Cardiopulmonary status limiting activity;Decreased activity tolerance;Decreased balance;Decreased endurance;Decreased knowledge of use of DME;Decreased mobility;Decreased safety awareness;Decreased strength;Impaired flexibility;Postural dysfunction   Rehab Potential Good   PT Frequency 2x / week   PT Duration Other (comment)  9 weeks (60 days)   PT Treatment/Interventions ADLs/Self Care Home Management;DME Instruction;Gait training;Functional mobility training;Stair training;Therapeutic activities;Therapeutic exercise;Balance training;Neuromuscular re-education;Patient/family  education;Vestibular   PT Next Visit Plan Check remaining STGs and continue with static and dynamic balance training. ? Gait with rollator for longer, community distances.   Consulted and Agree with Plan of Care Patient        Problem List Patient Active Problem List   Diagnosis Date Noted  . Chronic systolic heart failure ([place])   . S/P CABG x 2   . Chronic kidney disease (CKD)   . Fatigue 11/11/2014  . NSVT (nonsustained ventricular tachycardia) ([place]) 11/11/2014  . LV dysfunction 07/23/2014  . Hyperlipidemia 07/27/2013  . Dizzy spells 10/24/2011  . CAD (coronary artery disease) 03/30/2011  . PVC's (premature ventricular contractions) 03/30/2011  . ANEMIA, IRON DEFICIENCY 03/12/2008  . GERD 03/12/2008    Bayard Beaver, [place] 07/04/2015, 2:03 PM  [facility] [address] [place], [place], [address] Phone: [phone]   Fax:  [phone]  Name: MUMIN DENOMME MRN: [id number] Date of Birth: 1926/05/24  This note has been reviewed and edited by supervising CI.  Willow Ora, PTA, [facility] [address] [place], [place] [address] [phone] 07/04/2015, 2:12 PM

## 2015-07-06 ENCOUNTER — Encounter: Payer: Self-pay | Admitting: Physical Therapy

## 2015-07-06 ENCOUNTER — Ambulatory Visit: Payer: PPO | Admitting: Physical Therapy

## 2015-07-06 DIAGNOSIS — R2689 Other abnormalities of gait and mobility: Secondary | ICD-10-CM

## 2015-07-06 DIAGNOSIS — R269 Unspecified abnormalities of gait and mobility: Secondary | ICD-10-CM

## 2015-07-06 DIAGNOSIS — R2681 Unsteadiness on feet: Secondary | ICD-10-CM

## 2015-07-06 DIAGNOSIS — R6889 Other general symptoms and signs: Secondary | ICD-10-CM

## 2015-07-06 NOTE — Therapy (Addendum)
[facility] [address] [place], [place], [address] Phone: [phone]   Fax:  [phone]  Physical Therapy Treatment  Patient Details  Name: Gary Gonzalez MRN: [id number] Date of Birth: 1927-02-21 Referring Provider: [name], MD  Encounter Date: 07/06/2015      PT End of Session - 07/06/15 1239    Visit Number 10   Number of Visits 18   Date for PT Re-Evaluation 08/05/15   Authorization Type Medicare G-code & progress note every 10th visit   PT Start Time 1100   PT Stop Time 1145   PT Time Calculation (min) 45 min   Equipment Utilized During Treatment Gait belt   Activity Tolerance Patient tolerated treatment well   Behavior During Therapy [facility] for tasks assessed/performed      Past Medical History  Diagnosis Date  . Hyperlipidemia   . Coronary artery disease     a. s/p 2v CABG (seq LIMA to Diag and LAD, SVG to OM) on 05/15/2001.  Marland Kitchen Hypothyroidism   . PMR (polymyalgia rheumatica) (HCC)   . NSVT (nonsustained ventricular tachycardia) ([place])   . PVC's (premature ventricular contractions)   . Sleep apnea     "wife says I do" (11/18/2014)  . Anemia   . Arthritis     "touch in the fingers" (11/17/2014)  . Fibromyalgia     "a long time ago" (11/17/2014)  . History of gout   . Melanoma of back ([place])   . CKD (chronic kidney disease), stage III   . LBBB (left bundle branch block)   . Chronic combined systolic and diastolic CHF (congestive heart failure) ([place])     a. 11/17/14 showed EF 25-30%, suboptimal image quality, diffuse hypokinesis worse in the inferior wall, mild to moderate MR. New drop EF compared to 2012 but cath was stable.    Past Surgical History  Procedure Laterality Date  . Coronary artery bypass graft  05/15/2001    x3 sequential LIMA to diag and LAD, SVG to OM   . Cardiac catheterization  04/28/2001  . Melanoma excision  2001    off of back  . Cataract extraction w/ intraocular lens  implant,  bilateral Bilateral ~ 2010  . Axillary lymph node dissection Bilateral 2001  . Cardiac catheterization N/A 11/19/2014    Procedure: Right/Left Heart Cath and Coronary/Graft Angiography;  Surgeon: Jettie Booze, MD;  Location: [facility];  Service: Cardiovascular;  Laterality: N/A;    There were no vitals filed for this visit.  Visit Diagnosis:  Decreased functional activity tolerance  Unsteadiness  Abnormality of gait  Balance problems      Subjective Assessment - 07/06/15 1104    Subjective Patient reports no falls. Reports having a routine of going to the gym and riding stationary bike multiple times/week but hasn't gone back yet.    Pertinent History Pt likes to be called "[name]".  PMH:  CHF, CAD w/ CABG 2003, PVCs, LBBB, polymyalgia, fibromylagia, OA, gout, CKD stage III   Patient Stated Goals Walk without a cane.   Currently in Pain? No/denies          [facility] Adult PT Treatment/Exercise - 07/06/15 1104    Ambulation/Gait   Ambulation/Gait Yes   Ambulation/Gait Assistance 4: Min guard;5: Supervision   Ambulation/Gait Assistance Details Cues to stop R drift, with patient bumping into walls and other objects on R side, especially before and after turns.   Ambulation Distance (Feet) 420 Feet   Assistive  device Straight cane   Gait Pattern Decreased stride length;Decreased arm swing - left;Step-through pattern;Right foot flat;Left foot flat;Right flexed knee in stance;Left flexed knee in stance   Ambulation Surface Level;Indoor   Neuro Re-ed    Neuro Re-ed Details  // Bars rockerboard horizontal standing no head movement, standing alternating UE and BUE raises, standing head nod, standing head shake, 2' boxing to target, 2' 360 defense; rockerboard vertical 2' boxing  patient struggled with head shake on horiz and boxing vert   Exercises   Exercises Knee/Hip   Knee/Hip Exercises: Standing   Heel Raises Both;2 sets;10 reps   Hip Flexion Stengthening;Both;2 sets;10  reps   Hip Abduction Stengthening;2 sets;10 reps   Hip Extension Stengthening;2 sets;10 reps   Knee/Hip Exercises: Seated   Long Arc Quad Both;2 sets;10 reps   Abduction/Adduction  Strengthening;2 sets;10 reps  ADD Against ball, 3s holds          PT Short Term Goals - 07/04/15 1357    PT SHORT TERM GOAL #1   Title Patient demonstrates understanding of initial HEP (Target Date: 07/07/2015)   Time 1   Period Months   Status On-going   PT SHORT TERM GOAL #2   Title Patient reaches 7" anteriorly, across midline, laterally & to ground safely with no balance losses. (Target Date: 07/07/2015)   Baseline Goal MET 07/04/15 with 8" or more in each direction with no LOB.   Time 1   Period Months   Status Achieved   PT SHORT TERM GOAL #3   Title Patient ambulates with straight cane with head turns to scan environment without balance losses.  (Target Date: 07/07/2015)   Baseline As of 07/04/15 patient ambulates with straight cane with head turns vertical and horizontal with no LOB but struggles with diagonals.   Time 1   Period Months   Status On-going   PT SHORT TERM GOAL #4   Title Patient ambulates 400' with straight cane with supervision.  (Target Date: 07/07/2015)   Baseline As of 07/04/15 patient could ambulate 400' with straight cane with min guard to supervision.   Time 1   Period Months   Status On-going   PT SHORT TERM GOAL #5   Title Patient negotiates ramps & curbs with straight cane with min guard assist.  (Target Date: 07/07/2015)   Baseline Goal MET 07/04/15 patient negotiated ramps and curbs with straight cain with min guard to supervision.   Time 1   Period Months   Status Achieved           PT Long Term Goals - 06/10/15 1657    PT LONG TERM GOAL #1   Title Patient demonstrates & verbalizes understanding of ongoing HEP / fitness plan. (Target Date: 08/05/2015)   Time 2   Period Months   Status On-going   PT LONG TERM GOAL #2   Title Berg Balance >45/56 to indicate lower fall  risk. (Target Date: 08/05/2015)   Time 2   Period Months   Status On-going   PT LONG TERM GOAL #3   Title Functional Gait Assessment >/= 12/30 to indicate lower fall risk. (Target Date: 08/05/2015)   Time 2   Period Months   Status On-going   PT LONG TERM GOAL #4   Title Patient verbalizes fall prevention strategies. (Target Date: 08/05/2015)   Time 2   Status On-going   PT LONG TERM GOAL #5   Title ABC scale improved >10% to indicate greater confidence with balance activites. (Target  Date: 08/05/2015)   Time 2   Period Months   Status On-going   PT LONG TERM GOAL #6   Title Patient ambulates 500' with straight cane outside including grass, ramps & curbs modified independent for safe community mobility. (Target Date: 08/05/2015)   Time 2   Period Months   Status On-going         Plan - 2015-07-19 1240    Clinical Impression Statement Skilled session addressing deficits in gait and balance. Patient struggled with rockerboard but persevered through the challenge and remained enthusiastic about having "a really good workout." Patient is making consistent progress toward goals.    Pt will benefit from skilled therapeutic intervention in order to improve on the following deficits Abnormal gait;Cardiopulmonary status limiting activity;Decreased activity tolerance;Decreased balance;Decreased endurance;Decreased knowledge of use of DME;Decreased mobility;Decreased safety awareness;Decreased strength;Impaired flexibility;Postural dysfunction   Rehab Potential Good   PT Frequency 2x / week   PT Duration Other (comment)  9 weeks (60 days)   PT Treatment/Interventions ADLs/Self Care Home Management;DME Instruction;Gait training;Functional mobility training;Stair training;Therapeutic activities;Therapeutic exercise;Balance training;Neuromuscular re-education;Patient/family education;Vestibular   PT Next Visit Plan Review STGs and continue with static and dynamic balance training; discuess possible use of  rollator with gait for increased stabilty   Consulted and Agree with Plan of Care Patient         G-Codes - Jul 19, 2015 1245    Functional Assessment Tool Used no falls since therapy, gait with cane with head turns without loss of balance at supervision level, does veer right > left with gait using cane.         Problem List Patient Active Problem List   Diagnosis Date Noted  . Chronic systolic heart failure ([place])   . S/P CABG x 2   . Chronic kidney disease (CKD)   . Fatigue 11/11/2014  . NSVT (nonsustained ventricular tachycardia) ([place]) 11/11/2014  . LV dysfunction 07/23/2014  . Hyperlipidemia 07/27/2013  . Dizzy spells 10/24/2011  . CAD (coronary artery disease) 03/30/2011  . PVC's (premature ventricular contractions) 03/30/2011  . ANEMIA, IRON DEFICIENCY 03/12/2008  . GERD 03/12/2008    Bayard Beaver, [place] 07/19/2015, 12:48 PM  [facility] [address] [place], [place], [address] Phone: [phone]   Fax:  [phone]  Name: Gary Gonzalez MRN: [id number] Date of Birth: [birth year]  This note has been reviewed and edited by supervising CI.  Willow Ora, PTA, [facility] [address] [place], [place] [address] [phone] 2015-07-19, 1:47 PM   Physical Therapy Progress Note  Dates of Reporting Period: 06/07/2015 to 07-19-2015  Objective Reports of Subjective Statement: Patient reports balance seems better but still has dizziness & issues limiting his mobility with fear of falling.   Objective Measurements: See above  Goal Update: See above  Plan: Continue plan of care established at evaluation.   Reason Skilled Services are Required: Patient has high fall risk & requires training to reduce his risk.       G-Codes - 07-19-15 1700    Functional Assessment Tool Used no falls since therapy, gait with cane with head turns without loss of balance at supervision level, does  veer right > left with gait using cane.   Functional Limitation Mobility: Walking and moving around   Mobility: Walking and Moving Around Current Status [phone]) At least 40 percent but less than 60 percent impaired, limited or restricted   Mobility: Walking and Moving Around Goal Status ([id number]) At least 20  percent but less than 40 percent impaired, limited or restricted      Jamey Reas, PT, DPT PT Specializing in [place] 07/07/2015 7:02 AM Phone:  [phone]  Fax:  [phone] [facility] [address] [place], [place] [address]

## 2015-07-08 ENCOUNTER — Other Ambulatory Visit: Payer: PPO

## 2015-07-11 ENCOUNTER — Encounter: Payer: Self-pay | Admitting: Physical Therapy

## 2015-07-11 ENCOUNTER — Ambulatory Visit: Payer: PPO | Admitting: Physical Therapy

## 2015-07-11 DIAGNOSIS — R6889 Other general symptoms and signs: Secondary | ICD-10-CM | POA: Diagnosis not present

## 2015-07-11 DIAGNOSIS — R2681 Unsteadiness on feet: Secondary | ICD-10-CM

## 2015-07-11 DIAGNOSIS — W19XXXS Unspecified fall, sequela: Secondary | ICD-10-CM

## 2015-07-11 DIAGNOSIS — R269 Unspecified abnormalities of gait and mobility: Secondary | ICD-10-CM

## 2015-07-11 DIAGNOSIS — R2689 Other abnormalities of gait and mobility: Secondary | ICD-10-CM

## 2015-07-11 NOTE — Therapy (Signed)
[facility] [address] [place], [place], [address] Phone: [phone]   Fax:  [phone]  Physical Therapy Treatment  Patient Details  Name: Gary Gonzalez MRN: [id number] Date of Birth: May 10, 1926 Referring Provider: [name], MD  Encounter Date: 07/11/2015      PT End of Session - 07/11/15 1100    Visit Number 11   Number of Visits 18   Date for PT Re-Evaluation 08/05/15   Authorization Type Medicare G-code & progress note every 10th visit   PT Start Time 1104   PT Stop Time 1150   PT Time Calculation (min) 46 min   Equipment Utilized During Treatment Gait belt   Activity Tolerance Patient tolerated treatment well   Behavior During Therapy [facility] for tasks assessed/performed      Past Medical History  Diagnosis Date  . Hyperlipidemia   . Coronary artery disease     a. s/p 2v CABG (seq LIMA to Diag and LAD, SVG to OM) on 05/15/2001.  Marland Kitchen Hypothyroidism   . PMR (polymyalgia rheumatica) (HCC)   . NSVT (nonsustained ventricular tachycardia) ([place])   . PVC's (premature ventricular contractions)   . Sleep apnea     "wife says I do" (11/18/2014)  . Anemia   . Arthritis     "touch in the fingers" (11/17/2014)  . Fibromyalgia     "a long time ago" (11/17/2014)  . History of gout   . Melanoma of back ([place])   . CKD (chronic kidney disease), stage III   . LBBB (left bundle branch block)   . Chronic combined systolic and diastolic CHF (congestive heart failure) ([place])     a. 11/17/14 showed EF 25-30%, suboptimal image quality, diffuse hypokinesis worse in the inferior wall, mild to moderate MR. New drop EF compared to 2012 but cath was stable.    Past Surgical History  Procedure Laterality Date  . Coronary artery bypass graft  05/15/2001    x3 sequential LIMA to diag and LAD, SVG to OM   . Cardiac catheterization  04/28/2001  . Melanoma excision  2001    off of back  . Cataract extraction w/ intraocular lens   implant, bilateral Bilateral ~ 2010  . Axillary lymph node dissection Bilateral 2001  . Cardiac catheterization N/A 11/19/2014    Procedure: Right/Left Heart Cath and Coronary/Graft Angiography;  Surgeon: Jettie Booze, MD;  Location: [facility];  Service: Cardiovascular;  Laterality: N/A;    There were no vitals filed for this visit.  Visit Diagnosis:  Decreased functional activity tolerance  Unsteadiness  Abnormality of gait  Balance problems  Falls, sequela      Subjective Assessment - 07/11/15 1108    Subjective No falls. He has been doing his exercises   Patient Stated Goals Walk without a cane.   Currently in Pain? No/denies     Gait Training:  Patient ambulated 100' X 2 with single point cane with supervision with unsteadiness, cues on safety. PT instructed in proper, safe rollator walker use including brake management, position within rollator walker, sit to/from stand from rollator seat & chairs with armrests and negotiate ramps/curbs. Patient ambulated 250' X 2 with rollator walker including ramps & curbs with supervision.  Self Care: patient education in fall prevention strategies including ambulatory devices in home & community settings. PT instructed in activity level & exercise program.  PT Education - 07/11/15 1100    Education provided Yes   Education Details use of rollator walker, maintaining & increasing activity level without increasing risk of falls, exercises at gym with lower intensity & lower duration maintaining HR <100bpm.    Person(s) Educated Patient   Methods Explanation   Comprehension Verbalized understanding          PT Short Term Goals - 07/04/15 1357    PT SHORT TERM GOAL #1   Title Patient demonstrates understanding of initial HEP (Target Date: 07/07/2015)   Time 1   Period Months   Status On-going   PT SHORT TERM GOAL #2   Title Patient reaches 7" anteriorly, across  midline, laterally & to ground safely with no balance losses. (Target Date: 07/07/2015)   Baseline Goal MET 07/04/15 with 8" or more in each direction with no LOB.   Time 1   Period Months   Status Achieved   PT SHORT TERM GOAL #3   Title Patient ambulates with straight cane with head turns to scan environment without balance losses.  (Target Date: 07/07/2015)   Baseline As of 07/04/15 patient ambulates with straight cane with head turns vertical and horizontal with no LOB but struggles with diagonals.   Time 1   Period Months   Status On-going   PT SHORT TERM GOAL #4   Title Patient ambulates 400' with straight cane with supervision.  (Target Date: 07/07/2015)   Baseline As of 07/04/15 patient could ambulate 400' with straight cane with min guard to supervision.   Time 1   Period Months   Status On-going   PT SHORT TERM GOAL #5   Title Patient negotiates ramps & curbs with straight cane with min guard assist.  (Target Date: 07/07/2015)   Baseline Goal MET 07/04/15 patient negotiated ramps and curbs with straight cain with min guard to supervision.   Time 1   Period Months   Status Achieved           PT Long Term Goals - 06/10/15 1657    PT LONG TERM GOAL #1   Title Patient demonstrates & verbalizes understanding of ongoing HEP / fitness plan. (Target Date: 08/05/2015)   Time 2   Period Months   Status On-going   PT LONG TERM GOAL #2   Title Berg Balance >45/56 to indicate lower fall risk. (Target Date: 08/05/2015)   Time 2   Period Months   Status On-going   PT LONG TERM GOAL #3   Title Functional Gait Assessment >/= 12/30 to indicate lower fall risk. (Target Date: 08/05/2015)   Time 2   Period Months   Status On-going   PT LONG TERM GOAL #4   Title Patient verbalizes fall prevention strategies. (Target Date: 08/05/2015)   Time 2   Status On-going   PT LONG TERM GOAL #5   Title ABC scale improved >10% to indicate greater confidence with balance activites. (Target Date: 08/05/2015)   Time 2    Period Months   Status On-going   PT LONG TERM GOAL #6   Title Patient ambulates 500' with straight cane outside including grass, ramps & curbs modified independent for safe community mobility. (Target Date: 08/05/2015)   Time 2   Period Months   Status On-going               Plan - 07/11/15 1444    Clinical Impression Statement Patient verbalized understanding how rollator walker assists with higher activity level with lower fall  risk. Patient appears safest with rollator use for community level mobility & cane for household level mobility.    Pt will benefit from skilled therapeutic intervention in order to improve on the following deficits Abnormal gait;Cardiopulmonary status limiting activity;Decreased activity tolerance;Decreased balance;Decreased endurance;Decreased knowledge of use of DME;Decreased mobility;Decreased safety awareness;Decreased strength;Impaired flexibility;Postural dysfunction   Rehab Potential Good   PT Frequency 2x / week   PT Duration Other (comment)  9 weeks (60 days)   PT Treatment/Interventions ADLs/Self Care Home Management;DME Instruction;Gait training;Functional mobility training;Stair training;Therapeutic activities;Therapeutic exercise;Balance training;Neuromuscular re-education;Patient/family education;Vestibular   PT Next Visit Plan patient to bring his rollator walker for training as he reports braking system is different. continue with static and dynamic balance training; discuess possible use of rollator with gait for increased stabilty   Consulted and Agree with Plan of Care Patient        Problem List Patient Active Problem List   Diagnosis Date Noted  . Chronic systolic heart failure ([place])   . S/P CABG x 2   . Chronic kidney disease (CKD)   . Fatigue 11/11/2014  . NSVT (nonsustained ventricular tachycardia) ([place]) 11/11/2014  . LV dysfunction 07/23/2014  . Hyperlipidemia 07/27/2013  . Dizzy spells 10/24/2011  . CAD (coronary artery  disease) 03/30/2011  . PVC's (premature ventricular contractions) 03/30/2011  . ANEMIA, IRON DEFICIENCY 03/12/2008  . GERD 03/12/2008    Ahnaf Caponi PT, DPT 07/11/2015, 2:49 PM  [facility] [address] [place], [place], [address] Phone: [phone]   Fax:  [phone]  Name: Gary Gonzalez MRN: [id number] Date of Birth: 1926/07/09

## 2015-07-12 DIAGNOSIS — R7301 Impaired fasting glucose: Secondary | ICD-10-CM | POA: Diagnosis not present

## 2015-07-12 DIAGNOSIS — Z125 Encounter for screening for malignant neoplasm of prostate: Secondary | ICD-10-CM | POA: Diagnosis not present

## 2015-07-12 DIAGNOSIS — E784 Other hyperlipidemia: Secondary | ICD-10-CM | POA: Diagnosis not present

## 2015-07-12 DIAGNOSIS — E038 Other specified hypothyroidism: Secondary | ICD-10-CM | POA: Diagnosis not present

## 2015-07-12 DIAGNOSIS — N183 Chronic kidney disease, stage 3 (moderate): Secondary | ICD-10-CM | POA: Diagnosis not present

## 2015-07-12 DIAGNOSIS — M109 Gout, unspecified: Secondary | ICD-10-CM | POA: Diagnosis not present

## 2015-07-13 ENCOUNTER — Ambulatory Visit: Payer: PPO | Admitting: Physical Therapy

## 2015-07-13 ENCOUNTER — Encounter: Payer: Self-pay | Admitting: Physical Therapy

## 2015-07-13 DIAGNOSIS — R2689 Other abnormalities of gait and mobility: Secondary | ICD-10-CM

## 2015-07-13 DIAGNOSIS — R6889 Other general symptoms and signs: Secondary | ICD-10-CM

## 2015-07-13 DIAGNOSIS — R2681 Unsteadiness on feet: Secondary | ICD-10-CM

## 2015-07-13 DIAGNOSIS — W19XXXS Unspecified fall, sequela: Secondary | ICD-10-CM

## 2015-07-13 DIAGNOSIS — R269 Unspecified abnormalities of gait and mobility: Secondary | ICD-10-CM

## 2015-07-13 NOTE — Therapy (Signed)
[facility] [address] [place], [place], [address] Phone: [phone]   Fax:  [phone]  Physical Therapy Treatment  Patient Details  Name: Gary Gonzalez MRN: [id number] Date of Birth: 1926/09/08 Referring Provider: [name], MD  Encounter Date: 07/13/2015      PT End of Session - 07/13/15 1145    Visit Number 12   Number of Visits 18   Date for PT Re-Evaluation 08/05/15   Authorization Type Medicare G-code & progress note every 10th visit   PT Start Time 1101   PT Stop Time 1145   PT Time Calculation (min) 44 min   Equipment Utilized During Treatment Gait belt   Activity Tolerance Patient tolerated treatment well   Behavior During Therapy [facility] for tasks assessed/performed      Past Medical History  Diagnosis Date  . Hyperlipidemia   . Coronary artery disease     a. s/p 2v CABG (seq LIMA to Diag and LAD, SVG to OM) on 05/15/2001.  Marland Kitchen Hypothyroidism   . PMR (polymyalgia rheumatica) (HCC)   . NSVT (nonsustained ventricular tachycardia) ([place])   . PVC's (premature ventricular contractions)   . Sleep apnea     "wife says I do" (11/18/2014)  . Anemia   . Arthritis     "touch in the fingers" (11/17/2014)  . Fibromyalgia     "a long time ago" (11/17/2014)  . History of gout   . Melanoma of back ([place])   . CKD (chronic kidney disease), stage III   . LBBB (left bundle branch block)   . Chronic combined systolic and diastolic CHF (congestive heart failure) ([place])     a. 11/17/14 showed EF 25-30%, suboptimal image quality, diffuse hypokinesis worse in the inferior wall, mild to moderate MR. New drop EF compared to 2012 but cath was stable.    Past Surgical History  Procedure Laterality Date  . Coronary artery bypass graft  05/15/2001    x3 sequential LIMA to diag and LAD, SVG to OM   . Cardiac catheterization  04/28/2001  . Melanoma excision  2001    off of back  . Cataract extraction w/ intraocular lens   implant, bilateral Bilateral ~ 2010  . Axillary lymph node dissection Bilateral 2001  . Cardiac catheterization N/A 11/19/2014    Procedure: Right/Left Heart Cath and Coronary/Graft Angiography;  Surgeon: Jettie Booze, MD;  Location: [facility];  Service: Cardiovascular;  Laterality: N/A;    There were no vitals filed for this visit.  Visit Diagnosis:  Decreased functional activity tolerance  Unsteadiness  Abnormality of gait  Balance problems  Falls, sequela      Subjective Assessment - 07/13/15 1103    Subjective (p) No falls.    Pertinent History (p) Pt likes to be called "[name]".  PMH:  CHF, CAD w/ CABG 2003, PVCs, LBBB, polymyalgia, fibromylagia, OA, gout, CKD stage III   Patient Stated Goals (p) Walk without a cane.   Currently in Pain? (p) No/denies      Gait Training:  PT reviewed rational for recommendation for rollator walker for community activities and household when fatigued or light-headed otherwise cane in house. Patient ambulated 350' with rollator walker with cues on technique and negotiated ramp / curb with cues on technique. Patient ambulates 100' X 2 around furniture with supervision.  Therapeutic Exercise: Pulsometer is inconsistent with reading depending on temperature of fingers. PT instructed in taking radial pulse. Pt return demo understanding with plans to  practice. Based on his age, PT recommended HR <100bpm with exercise & activities. PT demo treadmill safety including use of kill switch, mounting/ dismounting, starting/stopping with patient return demo under PT direction. Patient ambulated 1.5 mph for 2 minutes with HR 78 bpm with no signs of dyspnea. PT demo use of elliptical including mounting/dismounting and posture. Patient performed 2 minutes level 1 with HR 76bpm.                           PT Education - 07/13/15 1145    Education provided Yes   Education Details taking pulse & recommendation HR <100bpm  with exercise / activities.    Person(s) Educated Patient   Methods Explanation;Demonstration;Verbal cues   Comprehension Verbalized understanding;Returned demonstration;Verbal cues required;Need further instruction          PT Short Term Goals - 07/04/15 1357    PT SHORT TERM GOAL #1   Title Patient demonstrates understanding of initial HEP (Target Date: 07/07/2015)   Time 1   Period Months   Status On-going   PT SHORT TERM GOAL #2   Title Patient reaches 7" anteriorly, across midline, laterally & to ground safely with no balance losses. (Target Date: 07/07/2015)   Baseline Goal MET 07/04/15 with 8" or more in each direction with no LOB.   Time 1   Period Months   Status Achieved   PT SHORT TERM GOAL #3   Title Patient ambulates with straight cane with head turns to scan environment without balance losses.  (Target Date: 07/07/2015)   Baseline As of 07/04/15 patient ambulates with straight cane with head turns vertical and horizontal with no LOB but struggles with diagonals.   Time 1   Period Months   Status On-going   PT SHORT TERM GOAL #4   Title Patient ambulates 400' with straight cane with supervision.  (Target Date: 07/07/2015)   Baseline As of 07/04/15 patient could ambulate 400' with straight cane with min guard to supervision.   Time 1   Period Months   Status On-going   PT SHORT TERM GOAL #5   Title Patient negotiates ramps & curbs with straight cane with min guard assist.  (Target Date: 07/07/2015)   Baseline Goal MET 07/04/15 patient negotiated ramps and curbs with straight cain with min guard to supervision.   Time 1   Period Months   Status Achieved           PT Long Term Goals - 06/10/15 1657    PT LONG TERM GOAL #1   Title Patient demonstrates & verbalizes understanding of ongoing HEP / fitness plan. (Target Date: 08/05/2015)   Time 2   Period Months   Status On-going   PT LONG TERM GOAL #2   Title Berg Balance >45/56 to indicate lower fall risk. (Target Date:  08/05/2015)   Time 2   Period Months   Status On-going   PT LONG TERM GOAL #3   Title Functional Gait Assessment >/= 12/30 to indicate lower fall risk. (Target Date: 08/05/2015)   Time 2   Period Months   Status On-going   PT LONG TERM GOAL #4   Title Patient verbalizes fall prevention strategies. (Target Date: 08/05/2015)   Time 2   Status On-going   PT LONG TERM GOAL #5   Title ABC scale improved >10% to indicate greater confidence with balance activites. (Target Date: 08/05/2015)   Time 2   Period Months   Status  On-going   PT LONG TERM GOAL #6   Title Patient ambulates 500' with straight cane outside including grass, ramps & curbs modified independent for safe community mobility. (Target Date: 08/05/2015)   Time 2   Period Months   Status On-going               Plan - 07/13/15 1145    Clinical Impression Statement Patient improved safety with rollator use. His gait velocity with rollator is significantly improved indicating safety & mobility. Patient was able to use treadmill & elliptical machine with assist & skilled instruction.    Pt will benefit from skilled therapeutic intervention in order to improve on the following deficits Abnormal gait;Cardiopulmonary status limiting activity;Decreased activity tolerance;Decreased balance;Decreased endurance;Decreased knowledge of use of DME;Decreased mobility;Decreased safety awareness;Decreased strength;Impaired flexibility;Postural dysfunction   Rehab Potential Good   PT Frequency 2x / week   PT Duration Other (comment)  9 weeks (60 days)   PT Treatment/Interventions ADLs/Self Care Home Management;DME Instruction;Gait training;Functional mobility training;Stair training;Therapeutic activities;Therapeutic exercise;Balance training;Neuromuscular re-education;Patient/family education;Vestibular   PT Next Visit Plan patient to bring his rollator walker for training as he reports braking system is different. review taking HR for monitoring  activity tolerance, continue with static and dynamic balance training; discuess possible use of rollator with gait for increased stabilty   Consulted and Agree with Plan of Care Patient        Problem List Patient Active Problem List   Diagnosis Date Noted  . Chronic systolic heart failure ([place])   . S/P CABG x 2   . Chronic kidney disease (CKD)   . Fatigue 11/11/2014  . NSVT (nonsustained ventricular tachycardia) ([place]) 11/11/2014  . LV dysfunction 07/23/2014  . Hyperlipidemia 07/27/2013  . Dizzy spells 10/24/2011  . CAD (coronary artery disease) 03/30/2011  . PVC's (premature ventricular contractions) 03/30/2011  . ANEMIA, IRON DEFICIENCY 03/12/2008  . GERD 03/12/2008    Charnetta Wulff PT, DPT 07/13/2015, 9:07 PM  [facility] [address] [place], [place], [address] Phone: [phone]   Fax:  [phone]  Name: SAAFIR ABDULLAH MRN: [id number] Date of Birth: 06/10/1926

## 2015-07-18 ENCOUNTER — Encounter: Payer: Self-pay | Admitting: Physical Therapy

## 2015-07-18 ENCOUNTER — Ambulatory Visit: Payer: PPO | Admitting: Physical Therapy

## 2015-07-18 DIAGNOSIS — R6889 Other general symptoms and signs: Secondary | ICD-10-CM

## 2015-07-18 DIAGNOSIS — W19XXXS Unspecified fall, sequela: Secondary | ICD-10-CM

## 2015-07-18 DIAGNOSIS — R2689 Other abnormalities of gait and mobility: Secondary | ICD-10-CM

## 2015-07-18 DIAGNOSIS — R269 Unspecified abnormalities of gait and mobility: Secondary | ICD-10-CM

## 2015-07-18 DIAGNOSIS — R2681 Unsteadiness on feet: Secondary | ICD-10-CM

## 2015-07-19 ENCOUNTER — Encounter: Payer: Self-pay | Admitting: Internal Medicine

## 2015-07-19 DIAGNOSIS — R634 Abnormal weight loss: Secondary | ICD-10-CM | POA: Diagnosis not present

## 2015-07-19 DIAGNOSIS — G6289 Other specified polyneuropathies: Secondary | ICD-10-CM | POA: Diagnosis not present

## 2015-07-19 DIAGNOSIS — Z0000 Encounter for general adult medical examination without abnormal findings: Secondary | ICD-10-CM | POA: Diagnosis not present

## 2015-07-19 DIAGNOSIS — Z6821 Body mass index (BMI) 21.0-21.9, adult: Secondary | ICD-10-CM | POA: Diagnosis not present

## 2015-07-19 DIAGNOSIS — N183 Chronic kidney disease, stage 3 (moderate): Secondary | ICD-10-CM | POA: Diagnosis not present

## 2015-07-19 DIAGNOSIS — I493 Ventricular premature depolarization: Secondary | ICD-10-CM | POA: Diagnosis not present

## 2015-07-19 DIAGNOSIS — B029 Zoster without complications: Secondary | ICD-10-CM | POA: Diagnosis not present

## 2015-07-19 DIAGNOSIS — I509 Heart failure, unspecified: Secondary | ICD-10-CM | POA: Diagnosis not present

## 2015-07-19 DIAGNOSIS — E875 Hyperkalemia: Secondary | ICD-10-CM | POA: Diagnosis not present

## 2015-07-19 DIAGNOSIS — I2510 Atherosclerotic heart disease of native coronary artery without angina pectoris: Secondary | ICD-10-CM | POA: Diagnosis not present

## 2015-07-19 DIAGNOSIS — R296 Repeated falls: Secondary | ICD-10-CM | POA: Diagnosis not present

## 2015-07-19 DIAGNOSIS — Z1389 Encounter for screening for other disorder: Secondary | ICD-10-CM | POA: Diagnosis not present

## 2015-07-19 NOTE — Therapy (Signed)
[facility] [address] [place], [place], [address] Phone: [phone]   Fax:  [phone]  Physical Therapy Treatment  Patient Details  Name: Gary Gonzalez MRN: [id number] Date of Birth: July 23, 1926 Referring Provider: [name], MD  Encounter Date: 07/18/2015      PT End of Session - 07/18/15 1300    Visit Number 13   Number of Visits 18   Date for PT Re-Evaluation 08/05/15   Authorization Type Medicare G-code & progress note every 10th visit   PT Start Time 1101   PT Stop Time 1145   PT Time Calculation (min) 44 min   Equipment Utilized During Treatment Gait belt   Activity Tolerance Patient tolerated treatment well   Behavior During Therapy [facility] for tasks assessed/performed      Past Medical History  Diagnosis Date  . Hyperlipidemia   . Coronary artery disease     a. s/p 2v CABG (seq LIMA to Diag and LAD, SVG to OM) on 05/15/2001.  Marland Kitchen Hypothyroidism   . PMR (polymyalgia rheumatica) (HCC)   . NSVT (nonsustained ventricular tachycardia) ([place])   . PVC's (premature ventricular contractions)   . Sleep apnea     "wife says I do" (11/18/2014)  . Anemia   . Arthritis     "touch in the fingers" (11/17/2014)  . Fibromyalgia     "a long time ago" (11/17/2014)  . History of gout   . Melanoma of back ([place])   . CKD (chronic kidney disease), stage III   . LBBB (left bundle branch block)   . Chronic combined systolic and diastolic CHF (congestive heart failure) ([place])     a. 11/17/14 showed EF 25-30%, suboptimal image quality, diffuse hypokinesis worse in the inferior wall, mild to moderate MR. New drop EF compared to 2012 but cath was stable.    Past Surgical History  Procedure Laterality Date  . Coronary artery bypass graft  05/15/2001    x3 sequential LIMA to diag and LAD, SVG to OM   . Cardiac catheterization  04/28/2001  . Melanoma excision  2001    off of back  . Cataract extraction w/ intraocular lens   implant, bilateral Bilateral ~ 2010  . Axillary lymph node dissection Bilateral 2001  . Cardiac catheterization N/A 11/19/2014    Procedure: Right/Left Heart Cath and Coronary/Graft Angiography;  Surgeon: Jettie Booze, MD;  Location: [facility];  Service: Cardiovascular;  Laterality: N/A;    There were no vitals filed for this visit.  Visit Diagnosis:  Decreased functional activity tolerance  Unsteadiness  Abnormality of gait  Balance problems  Falls, sequela      Subjective Assessment - 07/18/15 1100    Subjective No falls. He has been taking his heart rate as practice as PT instructed.   Pertinent History Pt likes to be called "Gary Gonzalez".  PMH:  CHF, CAD w/ CABG 2003, PVCs, LBBB, polymyalgia, fibromylagia, OA, gout, CKD stage III   Patient Stated Goals Walk without a cane.   Currently in Pain? No/denies     Gait Training: Patient brought his rollator walker. PT adjusted handle height & moved brake lines so not tension with moving back support. Patient ambulated 300' with rollator indoors negotiating furniture with cues. Patient negotiated ramp & curb with cues on technique for safety. Patient ambulated 500' outdoors including 100' on grass and ramp /curb with supervision with cues.   Therapeutic Exercise: See patient education. Treadmill 1.32mh X 2 minutes with supervision /  cues on use & safety. HR 72, 2 breathes to count to 15. Elliptical level 1 X 2 minutes with cues on mounting/ dismounting. HR 80, 2 breaths to count to 15                            PT Education - 07/18/15 1100    Education provided Yes   Education Details taking pulse, "Talk - Sing" test & "Count outloud to 15" number of breaths as guide to exercise tolerance   Person(s) Educated Patient   Methods Explanation;Demonstration;Tactile cues;Verbal cues   Comprehension Verbalized understanding;Returned demonstration;Verbal cues required;Tactile cues required;Need further  instruction          PT Short Term Goals - 07/04/15 1357    PT SHORT TERM GOAL #1   Title Patient demonstrates understanding of initial HEP (Target Date: 07/07/2015)   Time 1   Period Months   Status On-going   PT SHORT TERM GOAL #2   Title Patient reaches 7" anteriorly, across midline, laterally & to ground safely with no balance losses. (Target Date: 07/07/2015)   Baseline Goal MET 07/04/15 with 8" or more in each direction with no LOB.   Time 1   Period Months   Status Achieved   PT SHORT TERM GOAL #3   Title Patient ambulates with straight cane with head turns to scan environment without balance losses.  (Target Date: 07/07/2015)   Baseline As of 07/04/15 patient ambulates with straight cane with head turns vertical and horizontal with no LOB but struggles with diagonals.   Time 1   Period Months   Status On-going   PT SHORT TERM GOAL #4   Title Patient ambulates 400' with straight cane with supervision.  (Target Date: 07/07/2015)   Baseline As of 07/04/15 patient could ambulate 400' with straight cane with min guard to supervision.   Time 1   Period Months   Status On-going   PT SHORT TERM GOAL #5   Title Patient negotiates ramps & curbs with straight cane with min guard assist.  (Target Date: 07/07/2015)   Baseline Goal MET 07/04/15 patient negotiated ramps and curbs with straight cain with min guard to supervision.   Time 1   Period Months   Status Achieved           PT Long Term Goals - 06/10/15 1657    PT LONG TERM GOAL #1   Title Patient demonstrates & verbalizes understanding of ongoing HEP / fitness plan. (Target Date: 08/05/2015)   Time 2   Period Months   Status On-going   PT LONG TERM GOAL #2   Title Berg Balance >45/56 to indicate lower fall risk. (Target Date: 08/05/2015)   Time 2   Period Months   Status On-going   PT LONG TERM GOAL #3   Title Functional Gait Assessment >/= 12/30 to indicate lower fall risk. (Target Date: 08/05/2015)   Time 2   Period Months    Status On-going   PT LONG TERM GOAL #4   Title Patient verbalizes fall prevention strategies. (Target Date: 08/05/2015)   Time 2   Status On-going   PT LONG TERM GOAL #5   Title ABC scale improved >10% to indicate greater confidence with balance activites. (Target Date: 08/05/2015)   Time 2   Period Months   Status On-going   PT LONG TERM GOAL #6   Title Patient ambulates 500' with straight cane outside including grass, ramps &  curbs modified independent for safe community mobility. (Target Date: 08/05/2015)   Time 2   Period Months   Status On-going               Plan - 07/18/15 1145    Clinical Impression Statement Patient tolerated increased activity & exercise without HR or respirations increasing to unsafe level. Patient was able to use HR, Talk-Sing Test & Counting outloud to monitor & gauge activity level. Patient understands how rollator walker can increase activity level & decrease fall risk for community.                  Pt will benefit from skilled therapeutic intervention in order to improve on the following deficits Abnormal gait;Cardiopulmonary status limiting activity;Decreased activity tolerance;Decreased balance;Decreased endurance;Decreased knowledge of use of DME;Decreased mobility;Decreased safety awareness;Decreased strength;Impaired flexibility;Postural dysfunction   Rehab Potential Good   PT Frequency 2x / week   PT Duration Other (comment)  9 weeks (60 days)   PT Treatment/Interventions ADLs/Self Care Home Management;DME Instruction;Gait training;Functional mobility training;Stair training;Therapeutic activities;Therapeutic exercise;Balance training;Neuromuscular re-education;Patient/family education;Vestibular   PT Next Visit Plan  review taking HR, Talk-Sing Test & Count Outloud for monitoring activity tolerance, continue with static and dynamic balance training; rollator with  community gait & cane for household gait for increased stabilty   Consulted and Agree  with Plan of Care Patient        Problem List Patient Active Problem List   Diagnosis Date Noted  . Chronic systolic heart failure ([place])   . S/P CABG x 2   . Chronic kidney disease (CKD)   . Fatigue 11/11/2014  . NSVT (nonsustained ventricular tachycardia) ([place]) 11/11/2014  . LV dysfunction 07/23/2014  . Hyperlipidemia 07/27/2013  . Dizzy spells 10/24/2011  . CAD (coronary artery disease) 03/30/2011  . PVC's (premature ventricular contractions) 03/30/2011  . ANEMIA, IRON DEFICIENCY 03/12/2008  . GERD 03/12/2008    Carlie Corpus PT, DPT 07/19/2015, 12:28 PM  [facility] [address] [place], [place], [address] Phone: [phone]   Fax:  [phone]  Name: SHENANDOAH VANDERGRIFF MRN: [id number] Date of Birth: 12/22/1926

## 2015-07-20 ENCOUNTER — Encounter: Payer: Self-pay | Admitting: Physical Therapy

## 2015-07-20 ENCOUNTER — Ambulatory Visit: Payer: PPO | Admitting: Physical Therapy

## 2015-07-20 DIAGNOSIS — R6889 Other general symptoms and signs: Secondary | ICD-10-CM | POA: Diagnosis not present

## 2015-07-20 DIAGNOSIS — R269 Unspecified abnormalities of gait and mobility: Secondary | ICD-10-CM

## 2015-07-20 DIAGNOSIS — R2681 Unsteadiness on feet: Secondary | ICD-10-CM

## 2015-07-20 DIAGNOSIS — R2689 Other abnormalities of gait and mobility: Secondary | ICD-10-CM

## 2015-07-20 NOTE — Therapy (Signed)
[facility] [address] [place], [place], [address] Phone: [phone]   Fax:  [phone]  Physical Therapy Treatment  Patient Details  Name: Gary Gonzalez MRN: [id number] Date of Birth: 1926/08/13 Referring Provider: [name], MD  Encounter Date: 07/20/2015      PT End of Session - 07/20/15 1340    Visit Number 14   Number of Visits 18   Date for PT Re-Evaluation 08/05/15   Authorization Type Medicare G-code & progress note every 10th visit   PT Start Time 1100   PT Stop Time 1144   PT Time Calculation (min) 44 min   Equipment Utilized During Treatment Gait belt   Activity Tolerance Patient tolerated treatment well   Behavior During Therapy [facility] for tasks assessed/performed      Past Medical History  Diagnosis Date  . Hyperlipidemia   . Coronary artery disease     a. s/p 2v CABG (seq LIMA to Diag and LAD, SVG to OM) on 05/15/2001.  Marland Kitchen Hypothyroidism   . PMR (polymyalgia rheumatica) (HCC)   . NSVT (nonsustained ventricular tachycardia) ([place])   . PVC's (premature ventricular contractions)   . Sleep apnea     "wife says I do" (11/18/2014)  . Anemia   . Arthritis     "touch in the fingers" (11/17/2014)  . Fibromyalgia     "a long time ago" (11/17/2014)  . History of gout   . Melanoma of back ([place])   . CKD (chronic kidney disease), stage III   . LBBB (left bundle branch block)   . Chronic combined systolic and diastolic CHF (congestive heart failure) ([place])     a. 11/17/14 showed EF 25-30%, suboptimal image quality, diffuse hypokinesis worse in the inferior wall, mild to moderate MR. New drop EF compared to 2012 but cath was stable.    Past Surgical History  Procedure Laterality Date  . Coronary artery bypass graft  05/15/2001    x3 sequential LIMA to diag and LAD, SVG to OM   . Cardiac catheterization  04/28/2001  . Melanoma excision  2001    off of back  . Cataract extraction w/ intraocular lens   implant, bilateral Bilateral ~ 2010  . Axillary lymph node dissection Bilateral 2001  . Cardiac catheterization N/A 11/19/2014    Procedure: Right/Left Heart Cath and Coronary/Graft Angiography;  Surgeon: Jettie Booze, MD;  Location: [facility];  Service: Cardiovascular;  Laterality: N/A;    There were no vitals filed for this visit.  Visit Diagnosis:  Decreased functional activity tolerance  Unsteadiness  Abnormality of gait  Balance problems      Subjective Assessment - 07/20/15 1102    Subjective No falls. He has been taking his heart rate as practice as PT instructed. Patient states his cholesterol level has gone up and physician is prescribing new medicine to be brought next sesison.   Pertinent History Pt likes to be called "[name]".  PMH:  CHF, CAD w/ CABG 2003, PVCs, LBBB, polymyalgia, fibromylagia, OA, gout, CKD stage III   Patient Stated Goals Walk without a cane.   Currently in Pain? No/denies          [facility] Adult PT Treatment/Exercise - 07/20/15 1116    Ambulation/Gait   Ambulation/Gait Yes   Ambulation/Gait Assistance 5: Supervision   Ambulation/Gait Assistance Details Cues for R drift, especially with head turns.   Ambulation Distance (Feet) 500 Feet   Assistive device Rollator   Gait Pattern Decreased  stride length;Decreased arm swing - left;Step-through pattern;Right foot flat;Left foot flat;Right flexed knee in stance;Left flexed knee in stance   Ambulation Surface Unlevel;Outdoor   Neuro Re-ed    Neuro Re-ed Details  // Bars rockerboard horizontal standing no movement, vertical and horizontal head movements, and alternating/bilateral UE raises all for 10 reps each. Forward/reverse tandem walking, side-stepping, grapevine single UE support prn x 2 reps down/back. Standing tandem stance at sink 50 total seconds without UE support each foot forward. All min/guard to minA to decrease fall risk.   Exercises   Exercises Other Exercises   Other Exercises   // Bars seated on stool forward, reverse, lateral leans and core rotation x 10 reps each direction to improve core strength.          PT Education - 07/20/15 1340    Education provided Yes   Education Details Reviewed taking HR, Talk-Sing Test, and Count Out Loud. Added tandem stance at sink to HEP.   Person(s) Educated Patient   Methods Explanation;Demonstration;Verbal cues;Handout   Comprehension Verbalized understanding;Returned demonstration;Verbal cues required          PT Short Term Goals - 07/04/15 1357    PT SHORT TERM GOAL #1   Title Patient demonstrates understanding of initial HEP (Target Date: 07/07/2015)   Time 1   Period Months   Status On-going   PT SHORT TERM GOAL #2   Title Patient reaches 7" anteriorly, across midline, laterally & to ground safely with no balance losses. (Target Date: 07/07/2015)   Baseline Goal MET 07/04/15 with 8" or more in each direction with no LOB.   Time 1   Period Months   Status Achieved   PT SHORT TERM GOAL #3   Title Patient ambulates with straight cane with head turns to scan environment without balance losses.  (Target Date: 07/07/2015)   Baseline As of 07/04/15 patient ambulates with straight cane with head turns vertical and horizontal with no LOB but struggles with diagonals.   Time 1   Period Months   Status On-going   PT SHORT TERM GOAL #4   Title Patient ambulates 400' with straight cane with supervision.  (Target Date: 07/07/2015)   Baseline As of 07/04/15 patient could ambulate 400' with straight cane with min guard to supervision.   Time 1   Period Months   Status On-going   PT SHORT TERM GOAL #5   Title Patient negotiates ramps & curbs with straight cane with min guard assist.  (Target Date: 07/07/2015)   Baseline Goal MET 07/04/15 patient negotiated ramps and curbs with straight cain with min guard to supervision.   Time 1   Period Months   Status Achieved          PT Long Term Goals - 06/10/15 1657    PT LONG TERM GOAL #1    Title Patient demonstrates & verbalizes understanding of ongoing HEP / fitness plan. (Target Date: 08/05/2015)   Time 2   Period Months   Status On-going   PT LONG TERM GOAL #2   Title Berg Balance >45/56 to indicate lower fall risk. (Target Date: 08/05/2015)   Time 2   Period Months   Status On-going   PT LONG TERM GOAL #3   Title Functional Gait Assessment >/= 12/30 to indicate lower fall risk. (Target Date: 08/05/2015)   Time 2   Period Months   Status On-going   PT LONG TERM GOAL #4   Title Patient verbalizes fall prevention strategies. (Target Date:  08/05/2015)   Time 2   Status On-going   PT LONG TERM GOAL #5   Title ABC scale improved >10% to indicate greater confidence with balance activites. (Target Date: 08/05/2015)   Time 2   Period Months   Status On-going   PT LONG TERM GOAL #6   Title Patient ambulates 500' with straight cane outside including grass, ramps & curbs modified independent for safe community mobility. (Target Date: 08/05/2015)   Time 2   Period Months   Status On-going          Plan - 07/20/15 1341    Clinical Impression Statement Skilled session addressing deficits in gait on outdoor, unlevel surfaces, core strength,and static/dynamic balance, as well as review of additions to HEP. Patient displayed increased confidence when challenging balance and verbalizes increased enthusiasm for progress in therapy regarding balance.   Pt will benefit from skilled therapeutic intervention in order to improve on the following deficits Abnormal gait;Cardiopulmonary status limiting activity;Decreased activity tolerance;Decreased balance;Decreased endurance;Decreased knowledge of use of DME;Decreased mobility;Decreased safety awareness;Decreased strength;Impaired flexibility;Postural dysfunction   Rehab Potential Good   PT Frequency 2x / week   PT Duration Other (comment)  9 weeks (60 days)   PT Treatment/Interventions ADLs/Self Care Home Management;DME Instruction;Gait  training;Functional mobility training;Stair training;Therapeutic activities;Therapeutic exercise;Balance training;Neuromuscular re-education;Patient/family education;Vestibular   PT Next Visit Plan Continue with static and dynamic balance training; rollator with community gait & cane for household gait for increased stabilty. Consider further challenges to core strength.   PT Home Exercise Plan Added tandem stance at sink. See instructions.   Consulted and Agree with Plan of Care Patient      Problem List Patient Active Problem List   Diagnosis Date Noted  . Chronic systolic heart failure ([place])   . S/P CABG x 2   . Chronic kidney disease (CKD)   . Fatigue 11/11/2014  . NSVT (nonsustained ventricular tachycardia) ([place]) 11/11/2014  . LV dysfunction 07/23/2014  . Hyperlipidemia 07/27/2013  . Dizzy spells 10/24/2011  . CAD (coronary artery disease) 03/30/2011  . PVC's (premature ventricular contractions) 03/30/2011  . ANEMIA, IRON DEFICIENCY 03/12/2008  . GERD 03/12/2008    Bayard Beaver, [place] 07/20/2015, 1:45 PM  [facility] [address] [place], [place], [address] Phone: [phone]   Fax:  [phone]  Name: Gary Gonzalez MRN: [id number] Date of Birth: [birth year]  This note has been reviewed and edited by supervising CI.  Willow Ora, PTA, [facility] [address], [place], [place] [address] [phone] 07/20/2015, 11:07 PM

## 2015-07-20 NOTE — Patient Instructions (Addendum)
Tandem Stance    Right foot in front of left, heel touching toe both feet "straight ahead". Stand with front foot under sink. Begin counting when hands leave sink. Stop counting when hands touch sink. Resume counting where you left off. Balance in this position _50 total__ seconds. Do again with left foot in front of right. Do this 1-2 times per day, every day.  Copyright  VHI. All rights reserved.

## 2015-07-22 DIAGNOSIS — C44519 Basal cell carcinoma of skin of other part of trunk: Secondary | ICD-10-CM | POA: Diagnosis not present

## 2015-07-22 DIAGNOSIS — L821 Other seborrheic keratosis: Secondary | ICD-10-CM | POA: Diagnosis not present

## 2015-07-22 DIAGNOSIS — D1801 Hemangioma of skin and subcutaneous tissue: Secondary | ICD-10-CM | POA: Diagnosis not present

## 2015-07-22 DIAGNOSIS — L570 Actinic keratosis: Secondary | ICD-10-CM | POA: Diagnosis not present

## 2015-07-22 DIAGNOSIS — Z85828 Personal history of other malignant neoplasm of skin: Secondary | ICD-10-CM | POA: Diagnosis not present

## 2015-07-22 DIAGNOSIS — D485 Neoplasm of uncertain behavior of skin: Secondary | ICD-10-CM | POA: Diagnosis not present

## 2015-07-22 DIAGNOSIS — Z85820 Personal history of malignant melanoma of skin: Secondary | ICD-10-CM | POA: Diagnosis not present

## 2015-07-25 ENCOUNTER — Other Ambulatory Visit: Payer: PPO

## 2015-07-25 ENCOUNTER — Ambulatory Visit: Payer: PPO | Admitting: Physical Therapy

## 2015-07-25 ENCOUNTER — Encounter: Payer: Self-pay | Admitting: Physical Therapy

## 2015-07-25 DIAGNOSIS — R2689 Other abnormalities of gait and mobility: Secondary | ICD-10-CM

## 2015-07-25 DIAGNOSIS — R2681 Unsteadiness on feet: Secondary | ICD-10-CM

## 2015-07-25 DIAGNOSIS — R6889 Other general symptoms and signs: Secondary | ICD-10-CM

## 2015-07-25 DIAGNOSIS — R269 Unspecified abnormalities of gait and mobility: Secondary | ICD-10-CM

## 2015-07-25 DIAGNOSIS — W19XXXS Unspecified fall, sequela: Secondary | ICD-10-CM

## 2015-07-25 NOTE — Therapy (Signed)
[facility] [address] [place], [place], [address] Phone: [phone]   Fax:  [phone]  Physical Therapy Treatment  Patient Details  Name: Gary Gonzalez MRN: [id number] Date of Birth: Jan 03, 1927 Referring Provider: [name], MD  Encounter Date: 07/25/2015      PT End of Session - 07/25/15 1236    Visit Number 15   Number of Visits 18   Date for PT Re-Evaluation 08/05/15   Authorization Type Medicare G-code & progress note every 10th visit   PT Start Time 1100   PT Stop Time 1144   PT Time Calculation (min) 44 min   Equipment Utilized During Treatment Gait belt   Activity Tolerance Patient tolerated treatment well   Behavior During Therapy [facility] for tasks assessed/performed      Past Medical History  Diagnosis Date  . Hyperlipidemia   . Coronary artery disease     a. s/p 2v CABG (seq LIMA to Diag and LAD, SVG to OM) on 05/15/2001.  Marland Kitchen Hypothyroidism   . PMR (polymyalgia rheumatica) (HCC)   . NSVT (nonsustained ventricular tachycardia) ([place])   . PVC's (premature ventricular contractions)   . Sleep apnea     "wife says I do" (11/18/2014)  . Anemia   . Arthritis     "touch in the fingers" (11/17/2014)  . Fibromyalgia     "a long time ago" (11/17/2014)  . History of gout   . Melanoma of back ([place])   . CKD (chronic kidney disease), stage III   . LBBB (left bundle branch block)   . Chronic combined systolic and diastolic CHF (congestive heart failure) ([place])     a. 11/17/14 showed EF 25-30%, suboptimal image quality, diffuse hypokinesis worse in the inferior wall, mild to moderate MR. New drop EF compared to 2012 but cath was stable.    Past Surgical History  Procedure Laterality Date  . Coronary artery bypass graft  05/15/2001    x3 sequential LIMA to diag and LAD, SVG to OM   . Cardiac catheterization  04/28/2001  . Melanoma excision  2001    off of back  . Cataract extraction w/ intraocular lens   implant, bilateral Bilateral ~ 2010  . Axillary lymph node dissection Bilateral 2001  . Cardiac catheterization N/A 11/19/2014    Procedure: Right/Left Heart Cath and Coronary/Graft Angiography;  Surgeon: Jettie Booze, MD;  Location: [facility];  Service: Cardiovascular;  Laterality: N/A;    There were no vitals filed for this visit.  Visit Diagnosis:  Decreased functional activity tolerance  Unsteadiness  Balance problems  Falls, sequela  Abnormality of gait      Subjective Assessment - 07/25/15 1104    Subjective No falls. He has been taking his heart rate as practice as PT instructed. Patient states his cholesterol level has gone up and physician is prescribing new medicine to be brought next sesison (forgot this session; thinks it is called "Zetia").   Pertinent History Pt likes to be called "Gary Gonzalez".  PMH:  CHF, CAD w/ CABG 2003, PVCs, LBBB, polymyalgia, fibromylagia, OA, gout, CKD stage III   Patient Stated Goals Walk without a cane.   Currently in Pain? No/denies          [facility] Adult PT Treatment/Exercise - 07/25/15 1107    Neuro Re-ed    Neuro Re-ed Details  // Bars rockerboard horizontal standing 30", head movement up/down, left/right x 10 reps each. Airex balance pad boxing activity with moving  target x 2'. On doubled red mats, side-stepping down/back x2, tandem forward/reverse, heel walking forward/reverse, toe walking forward/reverse with four LOB noted with clinician recovery to the side during dynamic challenges on mat. At counter, tandem stance each LE forward and single leg stance with each LE x 50 seconds each with no UE support. All exercises min/guard to modA to decrease fall risk.   Exercises   Other Exercises  // Bars seated on stool forward, reverse, lateral leans and core rotation x 10 reps each direction while holding green weighted ball, forward pass with emphasis on tall posture to improve core strength.          PT Short Term Goals -  07/04/15 1357    PT SHORT TERM GOAL #1   Title Patient demonstrates understanding of initial HEP (Target Date: 07/07/2015)   Time 1   Period Months   Status On-going   PT SHORT TERM GOAL #2   Title Patient reaches 7" anteriorly, across midline, laterally & to ground safely with no balance losses. (Target Date: 07/07/2015)   Baseline Goal MET 07/04/15 with 8" or more in each direction with no LOB.   Time 1   Period Months   Status Achieved   PT SHORT TERM GOAL #3   Title Patient ambulates with straight cane with head turns to scan environment without balance losses.  (Target Date: 07/07/2015)   Baseline As of 07/04/15 patient ambulates with straight cane with head turns vertical and horizontal with no LOB but struggles with diagonals.   Time 1   Period Months   Status On-going   PT SHORT TERM GOAL #4   Title Patient ambulates 400' with straight cane with supervision.  (Target Date: 07/07/2015)   Baseline As of 07/04/15 patient could ambulate 400' with straight cane with min guard to supervision.   Time 1   Period Months   Status On-going   PT SHORT TERM GOAL #5   Title Patient negotiates ramps & curbs with straight cane with min guard assist.  (Target Date: 07/07/2015)   Baseline Goal MET 07/04/15 patient negotiated ramps and curbs with straight cain with min guard to supervision.   Time 1   Period Months   Status Achieved          PT Long Term Goals - 06/10/15 1657    PT LONG TERM GOAL #1   Title Patient demonstrates & verbalizes understanding of ongoing HEP / fitness plan. (Target Date: 08/05/2015)   Time 2   Period Months   Status On-going   PT LONG TERM GOAL #2   Title Berg Balance >45/56 to indicate lower fall risk. (Target Date: 08/05/2015)   Time 2   Period Months   Status On-going   PT LONG TERM GOAL #3   Title Functional Gait Assessment >/= 12/30 to indicate lower fall risk. (Target Date: 08/05/2015)   Time 2   Period Months   Status On-going   PT LONG TERM GOAL #4   Title Patient  verbalizes fall prevention strategies. (Target Date: 08/05/2015)   Time 2   Status On-going   PT LONG TERM GOAL #5   Title ABC scale improved >10% to indicate greater confidence with balance activites. (Target Date: 08/05/2015)   Time 2   Period Months   Status On-going   PT LONG TERM GOAL #6   Title Patient ambulates 500' with straight cane outside including grass, ramps & curbs modified independent for safe community mobility. (Target Date: 08/05/2015)  Time 2   Period Months   Status On-going          Plan - 07/25/15 1238    Clinical Impression Statement Skilled session addressing deficits in core strength and static/dynamic balance including tandem and single leg stance. Patient verbalizes increased comfort with balance challenges and displays visibly improved ability with static balance. Patient continues to struggle with dynamic balance on compliant surfaces. Patient is making steady progress toward goals.   Pt will benefit from skilled therapeutic intervention in order to improve on the following deficits Abnormal gait;Cardiopulmonary status limiting activity;Decreased activity tolerance;Decreased balance;Decreased endurance;Decreased knowledge of use of DME;Decreased mobility;Decreased safety awareness;Decreased strength;Impaired flexibility;Postural dysfunction   Rehab Potential Good   PT Frequency 2x / week   PT Duration Other (comment)  9 weeks (60 days)   PT Treatment/Interventions ADLs/Self Care Home Management;DME Instruction;Gait training;Functional mobility training;Stair training;Therapeutic activities;Therapeutic exercise;Balance training;Neuromuscular re-education;Patient/family education;Vestibular   PT Next Visit Plan Continue with static and dynamic balance training; rollator with community gait & cane for household gait for increased stabilty. Consider further challenges to core strength. Continue to work toward [place] due soon.   PT Home Exercise Plan Reviewed tandem  stance at sink. Consider addition of single leg stance.   Consulted and Agree with Plan of Care Patient      Problem List Patient Active Problem List   Diagnosis Date Noted  . Chronic systolic heart failure ([place])   . S/P CABG x 2   . Chronic kidney disease (CKD)   . Fatigue 11/11/2014  . NSVT (nonsustained ventricular tachycardia) ([place]) 11/11/2014  . LV dysfunction 07/23/2014  . Hyperlipidemia 07/27/2013  . Dizzy spells 10/24/2011  . CAD (coronary artery disease) 03/30/2011  . PVC's (premature ventricular contractions) 03/30/2011  . ANEMIA, IRON DEFICIENCY 03/12/2008  . GERD 03/12/2008    Bayard Beaver, [place] 07/25/2015, 12:45 PM  [facility] [address] [place], [place], [address] Phone: [phone]   Fax:  [phone]  Name: ZEPLIN ALESHIRE MRN: [id number] Date of Birth: [birth year]  This note has been reviewed and edited by supervising CI.  Willow Ora, PTA, [facility] [address], [place], [place] [address] [phone] 07/25/2015, 3:07 PM

## 2015-07-27 ENCOUNTER — Ambulatory Visit: Payer: PPO | Admitting: Physical Therapy

## 2015-07-27 ENCOUNTER — Encounter: Payer: Self-pay | Admitting: Physical Therapy

## 2015-07-27 DIAGNOSIS — R269 Unspecified abnormalities of gait and mobility: Secondary | ICD-10-CM

## 2015-07-27 DIAGNOSIS — W19XXXS Unspecified fall, sequela: Secondary | ICD-10-CM

## 2015-07-27 DIAGNOSIS — R2681 Unsteadiness on feet: Secondary | ICD-10-CM

## 2015-07-27 DIAGNOSIS — R2689 Other abnormalities of gait and mobility: Secondary | ICD-10-CM

## 2015-07-27 DIAGNOSIS — R6889 Other general symptoms and signs: Secondary | ICD-10-CM

## 2015-07-28 NOTE — Therapy (Signed)
[facility] [address] [place], [place], [address] Phone: [phone]   Fax:  [phone]  Physical Therapy Treatment  Patient Details  Name: Gary Gonzalez MRN: [id number] Date of Birth: [birth year] Referring Provider: [name], MD  Encounter Date: 07/27/2015      PT End of Session - 07/27/15 1145    Visit Number 16   Number of Visits 18   Date for PT Re-Evaluation 08/05/15   Authorization Type Medicare G-code & progress note every 10th visit   PT Start Time 1101   PT Stop Time 1144   PT Time Calculation (min) 43 min   Equipment Utilized During Treatment Gait belt   Activity Tolerance Patient tolerated treatment well   Behavior During Therapy [facility] for tasks assessed/performed      Past Medical History  Diagnosis Date  . Hyperlipidemia   . Coronary artery disease     a. s/p 2v CABG (seq LIMA to Diag and LAD, SVG to OM) on 05/15/2001.  Marland Kitchen Hypothyroidism   . PMR (polymyalgia rheumatica) (HCC)   . NSVT (nonsustained ventricular tachycardia) ([place])   . PVC's (premature ventricular contractions)   . Sleep apnea     "wife says I do" (11/18/2014)  . Anemia   . Arthritis     "touch in the fingers" (11/17/2014)  . Fibromyalgia     "a long time ago" (11/17/2014)  . History of gout   . Melanoma of back ([place])   . CKD (chronic kidney disease), stage III   . LBBB (left bundle branch block)   . Chronic combined systolic and diastolic CHF (congestive heart failure) ([place])     a. 11/17/14 showed EF 25-30%, suboptimal image quality, diffuse hypokinesis worse in the inferior wall, mild to moderate MR. New drop EF compared to 2012 but cath was stable.    Past Surgical History  Procedure Laterality Date  . Coronary artery bypass graft  05/15/2001    x3 sequential LIMA to diag and LAD, SVG to OM   . Cardiac catheterization  04/28/2001  . Melanoma excision  2001    off of back  . Cataract extraction w/ intraocular lens   implant, bilateral Bilateral ~ 2010  . Axillary lymph node dissection Bilateral 2001  . Cardiac catheterization N/A 11/19/2014    Procedure: Right/Left Heart Cath and Coronary/Graft Angiography;  Surgeon: Jettie Booze, MD;  Location: [facility];  Service: Cardiovascular;  Laterality: N/A;    There were no vitals filed for this visit.  Visit Diagnosis:  Decreased functional activity tolerance  Unsteadiness  Balance problems  Falls, sequela  Abnormality of gait      Subjective Assessment - 07/27/15 1104    Subjective (p) No falls. Plans to go to [employer] after finishes PT. (PT requested he try at least once prior to discharge planned next week)   Pertinent History (p) Pt likes to be called "Gary Gonzalez".  PMH:  CHF, CAD w/ CABG 2003, PVCs, LBBB, polymyalgia, fibromylagia, OA, gout, CKD stage III   Patient Stated Goals (p) Walk without a cane.   Currently in Pain? (p) No/denies      Therapeutic Exercise: Patient took resting pulse68 and performed out loud count to 15 (2 breaths with 2nd at 12) correctly. Patient able to safely get on / off elliptical and tolerated 3 min level 1 with HR 88 and 15 count 2 breaths with 2nd at 9.  Seated rest for 5 minutes with PT  educating on analyzing exercise response and when to increase time, not increasing speed or intensity until tolerates time increases. Pt verbalized understanding. Patient needed reminder to use safety lanyard on Treadmill and straddle belt initially. Patient able to safely get on /off, start /stop and tolerated 3 minutes of gait at 1.59mh with BUE support. HR 92 and 15 count 2 breaths 2nd at 8.  Gait Training: Pre-gait in parallel bars for intermittent UE support: Hip ER, pelvic motion and weight shift to step right and left for direction changes. Patient able to change direction 90* to right & left with verbal cues. Patient ambulated 150' X 4 with single point cane with supervision and cues on  posture, step width and step length. PT reviewed recommendation for rollator use in community especially if going longer distances or if fatigued. Pt verbalized understanding.  Patient negotiated ramp & curb with single point cane with min guard and cues on technique. Balance activities in parallel bars with light UE support: standing crossways on foam beam static EO & EC (minA), alternate stepping forward on/off beam and backwards on/off beam, stepping over beam and holding position for 5 seconds.                            PT Education - 07/27/15 1100    Education provided Yes   Education Details exercise at counter to practice changing directions.    Person(s) Educated Patient   Methods Explanation;Demonstration;Verbal cues   Comprehension Verbalized understanding;Returned demonstration;Verbal cues required;Need further instruction          PT Short Term Goals - 07/04/15 1357    PT SHORT TERM GOAL #1   Title Patient demonstrates understanding of initial HEP (Target Date: 07/07/2015)   Time 1   Period Months   Status On-going   PT SHORT TERM GOAL #2   Title Patient reaches 7" anteriorly, across midline, laterally & to ground safely with no balance losses. (Target Date: 07/07/2015)   Baseline Goal MET 07/04/15 with 8" or more in each direction with no LOB.   Time 1   Period Months   Status Achieved   PT SHORT TERM GOAL #3   Title Patient ambulates with straight cane with head turns to scan environment without balance losses.  (Target Date: 07/07/2015)   Baseline As of 07/04/15 patient ambulates with straight cane with head turns vertical and horizontal with no LOB but struggles with diagonals.   Time 1   Period Months   Status On-going   PT SHORT TERM GOAL #4   Title Patient ambulates 400' with straight cane with supervision.  (Target Date: 07/07/2015)   Baseline As of 07/04/15 patient could ambulate 400' with straight cane with min guard to supervision.   Time 1    Period Months   Status On-going   PT SHORT TERM GOAL #5   Title Patient negotiates ramps & curbs with straight cane with min guard assist.  (Target Date: 07/07/2015)   Baseline Goal MET 07/04/15 patient negotiated ramps and curbs with straight cain with min guard to supervision.   Time 1   Period Months   Status Achieved           PT Long Term Goals - 06/10/15 1657    PT LONG TERM GOAL #1   Title Patient demonstrates & verbalizes understanding of ongoing HEP / fitness plan. (Target Date: 08/05/2015)   Time 2   Period Months   Status On-going  PT LONG TERM GOAL #2   Title Berg Balance >45/56 to indicate lower fall risk. (Target Date: 08/05/2015)   Time 2   Period Months   Status On-going   PT LONG TERM GOAL #3   Title Functional Gait Assessment >/= 12/30 to indicate lower fall risk. (Target Date: 08/05/2015)   Time 2   Period Months   Status On-going   PT LONG TERM GOAL #4   Title Patient verbalizes fall prevention strategies. (Target Date: 08/05/2015)   Time 2   Status On-going   PT LONG TERM GOAL #5   Title ABC scale improved >10% to indicate greater confidence with balance activites. (Target Date: 08/05/2015)   Time 2   Period Months   Status On-going   PT LONG TERM GOAL #6   Title Patient ambulates 500' with straight cane outside including grass, ramps & curbs modified independent for safe community mobility. (Target Date: 08/05/2015)   Time 2   Period Months   Status On-going               Plan - 07/27/15 1145    Clinical Impression Statement Patient demonstrated safe use of ellipitical & treadmill with monitoring cardiopulmonary response to enable to try returning to fitness center for light activities. He verbalized understanding to use rollator to enter, exit & walk around center as seat will enable rest as needed without walking to a chair.    Pt will benefit from skilled therapeutic intervention in order to improve on the following deficits Abnormal  gait;Cardiopulmonary status limiting activity;Decreased activity tolerance;Decreased balance;Decreased endurance;Decreased knowledge of use of DME;Decreased mobility;Decreased safety awareness;Decreased strength;Impaired flexibility;Postural dysfunction   Rehab Potential Good   PT Frequency 2x / week   PT Duration Other (comment)  9 weeks (60 days)   PT Treatment/Interventions ADLs/Self Care Home Management;DME Instruction;Gait training;Functional mobility training;Stair training;Therapeutic activities;Therapeutic exercise;Balance training;Neuromuscular re-education;Patient/family education;Vestibular   PT Next Visit Plan Assess LTGs over next 2 visits and discharge.    PT Home Exercise Plan Reviewed tandem stance at sink.   Consulted and Agree with Plan of Care Patient        Problem List Patient Active Problem List   Diagnosis Date Noted  . Chronic systolic heart failure ([place])   . S/P CABG x 2   . Chronic kidney disease (CKD)   . Fatigue 11/11/2014  . NSVT (nonsustained ventricular tachycardia) ([place]) 11/11/2014  . LV dysfunction 07/23/2014  . Hyperlipidemia 07/27/2013  . Dizzy spells 10/24/2011  . CAD (coronary artery disease) 03/30/2011  . PVC's (premature ventricular contractions) 03/30/2011  . ANEMIA, IRON DEFICIENCY 03/12/2008  . GERD 03/12/2008    Kerstyn Coryell PT, DPT 07/28/2015, 11:20 AM  [facility] [address] [place], [place], [address] Phone: [phone]   Fax:  [phone]  Name: Gary Gonzalez MRN: [id number] Date of Birth: Jun 26, 1926

## 2015-07-29 ENCOUNTER — Encounter: Payer: Self-pay | Admitting: Internal Medicine

## 2015-07-29 ENCOUNTER — Ambulatory Visit (INDEPENDENT_AMBULATORY_CARE_PROVIDER_SITE_OTHER): Payer: PPO | Admitting: Internal Medicine

## 2015-07-29 VITALS — BP 136/68 | HR 65 | Ht 72.0 in | Wt 157.0 lb

## 2015-07-29 DIAGNOSIS — I493 Ventricular premature depolarization: Secondary | ICD-10-CM | POA: Diagnosis not present

## 2015-07-29 NOTE — Progress Notes (Signed)
HPI [name] returns today for followup of his PVC's. He is a pleasant 82 yo man with a cardiomyopathy thought due to PVC's who had nice suppresion with his PVC's on amiodarone. He has his dose reduced because it was thought that he had worsening dizziness with the amiodarone. When I saw him last, his PVC"s had returned and we increased the amio to 200 mg daily. He appears to be doing well. He is in rehab and his strength has improved a bit. No other complaints today. No syncope. Allergies  Allergen Reactions  . Atenolol Diarrhea and Other (See Comments)    Very weak, fatigued, lethargic, no appetite     Current Outpatient Prescriptions  Medication Sig Dispense Refill  . amiodarone (PACERONE) 200 MG tablet Take 1 tablet (200 mg total) by mouth daily. 90 tablet 3  . aspirin 81 MG tablet Take 81 mg by mouth daily.    Marland Kitchen ezetimibe (ZETIA) 10 MG tablet Take 10 mg by mouth daily.    Marland Kitchen levothyroxine (SYNTHROID, LEVOTHROID) 75 MCG tablet Take 75 mcg by mouth daily before breakfast.     . mirtazapine (REMERON) 7.5 MG tablet Take 7.5 mg by mouth at bedtime.     No current facility-administered medications for this visit.     Past Medical History  Diagnosis Date  . Hyperlipidemia   . Coronary artery disease     a. s/p 2v CABG (seq LIMA to Diag and LAD, SVG to OM) on 05/15/2001.  Marland Kitchen Hypothyroidism   . PMR (polymyalgia rheumatica) (HCC)   . NSVT (nonsustained ventricular tachycardia) ([place])   . PVC's (premature ventricular contractions)   . Sleep apnea     "wife says I do" (11/18/2014)  . Anemia   . Arthritis     "touch in the fingers" (11/17/2014)  . Fibromyalgia     "a long time ago" (11/17/2014)  . History of gout   . Melanoma of back ([place])   . CKD (chronic kidney disease), stage III   . LBBB (left bundle branch block)   . Chronic combined systolic and diastolic CHF (congestive heart failure) ([place])     a. 11/17/14 showed EF 25-30%, suboptimal image quality, diffuse hypokinesis  worse in the inferior wall, mild to moderate MR. New drop EF compared to 2012 but cath was stable.    ROS:   All systems reviewed and negative except as noted in the HPI.   Past Surgical History  Procedure Laterality Date  . Coronary artery bypass graft  05/15/2001    x3 sequential LIMA to diag and LAD, SVG to OM   . Cardiac catheterization  04/28/2001  . Melanoma excision  2001    off of back  . Cataract extraction w/ intraocular lens  implant, bilateral Bilateral ~ 2010  . Axillary lymph node dissection Bilateral 2001  . Cardiac catheterization N/A 11/19/2014    Procedure: Right/Left Heart Cath and Coronary/Graft Angiography;  Surgeon: Jettie Booze, MD;  Location: [facility];  Service: Cardiovascular;  Laterality: N/A;     Family History  Problem Relation Age of Onset  . Liver cancer Mother   . Prostate cancer Father   . Hyperlipidemia Son   . Hyperlipidemia Daughter      Social History   Social History  . Marital Status: Married    Spouse Name: N/A  . Number of Children: N/A  . Years of Education: N/A   Occupational History  . Not on file.  Social History Main Topics  . Smoking status: Never Smoker   . Smokeless tobacco: Never Used  . Alcohol Use: 3.6 oz/week    3 Glasses of wine, 3 Cans of beer per week  . Drug Use: No  . Sexual Activity: No   Other Topics Concern  . Not on file   Social History Narrative     BP 136/68 mmHg  Pulse 65  Ht 6' (1.829 m)  Wt 157 lb (71.215 kg)  BMI 21.29 kg/m2  Physical Exam:  Well appearing elderly man, NAD HEENT: Unremarkable Neck:  6 cm JVD, no thyromegally Back:  No CVA tenderness Lungs:  Clear with no wheezes HEART:  IRegular rate rhythm, no murmurs, no rubs, no clicks Abd:  soft, positive bowel sounds, no organomegally, no rebound, no guarding Ext:  2 plus pulses, no edema, no cyanosis, no clubbing Skin:  No rashes no nodules Neuro:  CN II through XII intact, motor grossly intact  ECG -  NSR with frequent and consecutive PVC's.  Assess/Plan: 1. CAD, s/p CABG - he is angina free. Will follow. 2. PVC's - his PVC's appear to have improved on the higher dose of amiodarone. He will continue 200 mg daily. 3. Dizziness - still present but better. Will follow 4. HTN - his blood pressure is stable. Will follow.

## 2015-07-29 NOTE — Patient Instructions (Signed)

## 2015-08-01 ENCOUNTER — Encounter: Payer: Self-pay | Admitting: Physical Therapy

## 2015-08-01 ENCOUNTER — Ambulatory Visit: Payer: PPO | Attending: Internal Medicine | Admitting: Physical Therapy

## 2015-08-01 DIAGNOSIS — R29818 Other symptoms and signs involving the nervous system: Secondary | ICD-10-CM | POA: Diagnosis not present

## 2015-08-01 DIAGNOSIS — R2681 Unsteadiness on feet: Secondary | ICD-10-CM

## 2015-08-01 DIAGNOSIS — R2689 Other abnormalities of gait and mobility: Secondary | ICD-10-CM | POA: Diagnosis not present

## 2015-08-01 DIAGNOSIS — W19XXXS Unspecified fall, sequela: Secondary | ICD-10-CM | POA: Insufficient documentation

## 2015-08-01 DIAGNOSIS — R296 Repeated falls: Secondary | ICD-10-CM | POA: Diagnosis not present

## 2015-08-01 DIAGNOSIS — R6889 Other general symptoms and signs: Secondary | ICD-10-CM | POA: Diagnosis not present

## 2015-08-01 NOTE — Therapy (Addendum)
[facility] [address] [place], [place], [address] Phone: [phone]   Fax:  [phone]  Physical Therapy Treatment  Patient Details  Name: Gary Gonzalez MRN: [id number] Date of Birth: Aug 30, 1926 Referring Provider: [name], MD  Encounter Date: 08/01/2015      PT End of Session - 08/01/15 1108    Visit Number 17   Number of Visits 18   Date for PT Re-Evaluation 08/05/15   Authorization Type Medicare G-code & progress note every 10th visit   PT Start Time 1104   PT Stop Time 1144   PT Time Calculation (min) 40 min   Equipment Utilized During Treatment Gait belt   Activity Tolerance Patient tolerated treatment well   Behavior During Therapy [facility] for tasks assessed/performed      Past Medical History  Diagnosis Date  . Hyperlipidemia   . Coronary artery disease     a. s/p 2v CABG (seq LIMA to Diag and LAD, SVG to OM) on 05/15/2001.  Marland Kitchen Hypothyroidism   . PMR (polymyalgia rheumatica) (HCC)   . NSVT (nonsustained ventricular tachycardia) ([place])   . PVC's (premature ventricular contractions)   . Sleep apnea     "wife says I do" (11/18/2014)  . Anemia   . Arthritis     "touch in the fingers" (11/17/2014)  . Fibromyalgia     "a long time ago" (11/17/2014)  . History of gout   . Melanoma of back ([place])   . CKD (chronic kidney disease), stage III   . LBBB (left bundle branch block)   . Chronic combined systolic and diastolic CHF (congestive heart failure) ([place])     a. 11/17/14 showed EF 25-30%, suboptimal image quality, diffuse hypokinesis worse in the inferior wall, mild to moderate MR. New drop EF compared to 2012 but cath was stable.    Past Surgical History  Procedure Laterality Date  . Coronary artery bypass graft  05/15/2001    x3 sequential LIMA to diag and LAD, SVG to OM   . Cardiac catheterization  04/28/2001  . Melanoma excision  2001    off of back  . Cataract extraction w/ intraocular lens  implant,  bilateral Bilateral ~ 2010  . Axillary lymph node dissection Bilateral 2001  . Cardiac catheterization N/A 11/19/2014    Procedure: Right/Left Heart Cath and Coronary/Graft Angiography;  Surgeon: Jettie Booze, MD;  Location: [facility];  Service: Cardiovascular;  Laterality: N/A;    There were no vitals filed for this visit.  Visit Diagnosis:   1.  Unsteadiness on feet    Other abnormalities of gait and mobility     Repeated falls        Subjective Assessment - 08/01/15 1107    Subjective No new complaints. No falls to report. No pain currently.   Pertinent History Pt likes to be called "Gary Gonzalez".  PMH:  CHF, CAD w/ CABG 2003, PVCs, LBBB, polymyalgia, fibromylagia, OA, gout, CKD stage III   Patient Stated Goals Walk without a cane.   Currently in Pain? No/denies            [facility] Adult PT Treatment/Exercise - 08/01/15 1110    Transfers   Sit to Stand 6: Modified independent (Device/Increase time);Without upper extremity assist;From chair/3-in-1   Stand to Sit 6: Modified independent (Device/Increase time);Without upper extremity assist;To chair/3-in-1   Ambulation/Gait   Ambulation/Gait Yes   Ambulation/Gait Assistance 5: Supervision;6: Modified independent (Device/Increase time)   Ambulation/Gait Assistance Details 2  episodes of balance loss: 1 on grass with pt self recovering, 1 on indoor level surfaces due to pt's feet crossing with self recovery, both towards right lateral side.                        Ambulation Distance (Feet) 500 Feet   Assistive device Straight cane   Gait Pattern Step-through pattern;Decreased stride length;Decreased arm swing - left;Scissoring;Trunk flexed;Narrow base of support   Ambulation Surface Level;Unlevel;Indoor;Outdoor;Paved;Gravel;Grass   Ramp 6: Modified independent (Device)  with cane   Curb 6: Modified independent (Device/increase time)  with cane   Berg Balance Test   Sit to Stand Able to stand without using hands and  stabilize independently   Standing Unsupported Able to stand safely 2 minutes   Sitting with Back Unsupported but Feet Supported on Floor or Stool Able to sit safely and securely 2 minutes   Stand to Sit Sits safely with minimal use of hands   Transfers Able to transfer safely, minor use of hands   Standing Unsupported with Eyes Closed Able to stand 10 seconds safely   Standing Ubsupported with Feet Together Able to place feet together independently and stand 1 minute safely   From Standing, Reach Forward with Outstretched Arm Can reach confidently >25 cm (10")   From Standing Position, Pick up Object from Floor Able to pick up shoe safely and easily   From Standing Position, Turn to Look Behind Over each Shoulder Looks behind one side only/other side shows less weight shift  right > left   Turn 360 Degrees Able to turn 360 degrees safely but slowly  >/= 6 seconds both ways   Standing Unsupported, Alternately Place Feet on Step/Stool Able to stand independently and complete 8 steps >20 seconds  16.56, not quite safely   Standing Unsupported, One Foot in Front Able to plae foot ahead of the other independently and hold 30 seconds   Standing on One Leg Tries to lift leg/unable to hold 3 seconds but remains standing independently   Total Score 48             PT Short Term Goals - 07/04/15 1357    PT SHORT TERM GOAL #1   Title Patient demonstrates understanding of initial HEP (Target Date: 07/07/2015)   Time 1   Period Months   Status On-going   PT SHORT TERM GOAL #2   Title Patient reaches 7" anteriorly, across midline, laterally & to ground safely with no balance losses. (Target Date: 07/07/2015)   Baseline Goal MET 07/04/15 with 8" or more in each direction with no LOB.   Time 1   Period Months   Status Achieved   PT SHORT TERM GOAL #3   Title Patient ambulates with straight cane with head turns to scan environment without balance losses.  (Target Date: 07/07/2015)   Baseline As of  07/04/15 patient ambulates with straight cane with head turns vertical and horizontal with no LOB but struggles with diagonals.   Time 1   Period Months   Status On-going   PT SHORT TERM GOAL #4   Title Patient ambulates 400' with straight cane with supervision.  (Target Date: 07/07/2015)   Baseline As of 07/04/15 patient could ambulate 400' with straight cane with min guard to supervision.   Time 1   Period Months   Status On-going   PT SHORT TERM GOAL #5   Title Patient negotiates ramps & curbs with straight cane with min  guard assist.  (Target Date: 07/07/2015)   Baseline Goal MET 07/04/15 patient negotiated ramps and curbs with straight cain with min guard to supervision.   Time 1   Period Months   Status Achieved           PT Long Term Goals - 08/01/15 1139    PT LONG TERM GOAL #1   Title Patient demonstrates & verbalizes understanding of ongoing HEP / fitness plan. (Target Date: 08/05/2015)   Baseline 08/01/15: has started back on fitness center, plans to return to 5x/week at lesser intensity than he did prior to therapy. Also doing his HEP for balance, stretching and strengthening at home.    Status Achieved   PT LONG TERM GOAL #2   Title Berg Balance >45/56 to indicate lower fall risk. (Target Date: 08/05/2015)   Baseline 08/01/15: scored 48/56 today   Status Achieved   PT LONG TERM GOAL #3   Title Functional Gait Assessment >/= 12/30 to indicate lower fall risk. (Target Date: 08/05/2015)   Time 2   Period Months   Status On-going   PT LONG TERM GOAL #4   Title Patient verbalizes fall prevention strategies. (Target Date: 08/05/2015)   Time 2   Status On-going   PT LONG TERM GOAL #5   Title ABC scale improved >10% to indicate greater confidence with balance activites. (Target Date: 08/05/2015)   Time 2   Period Months   Status On-going   PT LONG TERM GOAL #6   Title Patient ambulates 500' with straight cane outside including grass, ramps & curbs modified independent for safe community  mobility. (Target Date: 08/05/2015)   Baseline 08/01/15: supervison due to 1 loss of balance episiode on grass, othewise was Mod I with cane    Status Partially Met           Plan - 08/01/15 1108    Clinical Impression Statement Pt has met 1 and 2  of his LTGs and partially met LTG #6 today. Pt is progressing toward all others.    Pt will benefit from skilled therapeutic intervention in order to improve on the following deficits Abnormal gait;Cardiopulmonary status limiting activity;Decreased activity tolerance;Decreased balance;Decreased endurance;Decreased knowledge of use of DME;Decreased mobility;Decreased safety awareness;Decreased strength;Impaired flexibility;Postural dysfunction   Rehab Potential Good   PT Frequency 2x / week   PT Duration Other (comment)  9 weeks (60 days)   PT Treatment/Interventions ADLs/Self Care Home Management;DME Instruction;Gait training;Functional mobility training;Stair training;Therapeutic activities;Therapeutic exercise;Balance training;Neuromuscular re-education;Patient/family education;Vestibular   PT Next Visit Plan Check remaining 3 LTGs and discharge next visit per PT plan.   PT Home Exercise Plan Reviewed tandem stance at sink.   Consulted and Agree with Plan of Care Patient        Problem List Patient Active Problem List   Diagnosis Date Noted  . Chronic systolic heart failure (HCC)   . S/P CABG x 2   . Chronic kidney disease (CKD)   . Fatigue 11/11/2014  . NSVT (nonsustained ventricular tachycardia) (HCC) 11/11/2014  . LV dysfunction 07/23/2014  . Hyperlipidemia 07/27/2013  . Dizzy spells 10/24/2011  . CAD (coronary artery disease) 03/30/2011  . PVC's (premature ventricular contractions) 03/30/2011  . ANEMIA, IRON DEFICIENCY 03/12/2008  . GERD 03/12/2008    Kathy Bury, PTA, CLT [facility] [address], [address] [place], [place] [address] [phone] 08/01/2015, 1:33 PM   Name: Marlan J Haas MRN:  [id number] Date of Birth: 11/27/1926     

## 2015-08-03 ENCOUNTER — Ambulatory Visit: Payer: PPO | Admitting: Physical Therapy

## 2015-08-03 ENCOUNTER — Encounter: Payer: Self-pay | Admitting: Physical Therapy

## 2015-08-03 DIAGNOSIS — R2689 Other abnormalities of gait and mobility: Secondary | ICD-10-CM

## 2015-08-03 DIAGNOSIS — R2681 Unsteadiness on feet: Secondary | ICD-10-CM | POA: Diagnosis not present

## 2015-08-03 DIAGNOSIS — R296 Repeated falls: Secondary | ICD-10-CM

## 2015-08-04 NOTE — Therapy (Signed)
[facility] [address] [place], [place], [address] Phone: [phone]   Fax:  [phone]  Physical Therapy Treatment  Patient Details  Name: Gary Gonzalez MRN: [id number] Date of Birth: 01/23/1927 Referring Provider: [name], MD  Encounter Date: 08/03/2015     08/03/15 1150  PT Visits / Re-Eval  Visit Number 18  Number of Visits 18  Date for PT Re-Evaluation 08/05/15  Authorization  Authorization Type Medicare G-code & progress note every 10th visit  PT Time Calculation  PT Start Time 1105  PT Stop Time 1147  PT Time Calculation (min) 42 min  PT - End of Session  Equipment Utilized During Treatment Gait belt  Activity Tolerance Patient tolerated treatment well  Behavior During Therapy [facility] for tasks assessed/performed    Past Medical History  Diagnosis Date  . Hyperlipidemia   . Coronary artery disease     a. s/p 2v CABG (seq LIMA to Diag and LAD, SVG to OM) on 05/15/2001.  Marland Kitchen Hypothyroidism   . PMR (polymyalgia rheumatica) (HCC)   . NSVT (nonsustained ventricular tachycardia) ([place])   . PVC's (premature ventricular contractions)   . Sleep apnea     "wife says I do" (11/18/2014)  . Anemia   . Arthritis     "touch in the fingers" (11/17/2014)  . Fibromyalgia     "a long time ago" (11/17/2014)  . History of gout   . Melanoma of back ([place])   . CKD (chronic kidney disease), stage III   . LBBB (left bundle branch block)   . Chronic combined systolic and diastolic CHF (congestive heart failure) ([place])     a. 11/17/14 showed EF 25-30%, suboptimal image quality, diffuse hypokinesis worse in the inferior wall, mild to moderate MR. New drop EF compared to 2012 but cath was stable.    Past Surgical History  Procedure Laterality Date  . Coronary artery bypass graft  05/15/2001    x3 sequential LIMA to diag and LAD, SVG to OM   . Cardiac catheterization  04/28/2001  . Melanoma excision  2001    off of back  .  Cataract extraction w/ intraocular lens  implant, bilateral Bilateral ~ 2010  . Axillary lymph node dissection Bilateral 2001  . Cardiac catheterization N/A 11/19/2014    Procedure: Right/Left Heart Cath and Coronary/Graft Angiography;  Surgeon: Jettie Booze, MD;  Location: [facility];  Service: Cardiovascular;  Laterality: N/A;    There were no vitals filed for this visit.     08/03/15 1100  Observation/Other Assessments  Focus on Therapeutic Outcomes (FOTO)  55% Functional Status (Initial 27% FS)  Activities of Balance Confidence Scale (ABC Scale)  60% (initial was 10.6%)  Fear Avoidance Belief Questionnaire (FABQ)  19 (5)  Transfers  Sit to Stand 7: Independent  Stand to Sit 7: Independent  Ambulation/Gait  Ambulation/Gait Yes  Ambulation/Gait Assistance 6: Modified independent (Device/Increase time)  Ambulation/Gait Assistance Details No balance losses.  Ambulation Distance (Feet) 500 Feet  Assistive device Straight cane  Gait Pattern WFL;Step-through pattern  Ambulation Surface Indoor;Level;Outdoor;Unlevel;Grass  Gait velocity 3.11 ft/sec  Stairs Yes  Stairs Assistance 6: Modified independent (Device/Increase time)  Stair Management Technique One rail Right;Alternating pattern;Forwards  Number of Stairs 4  Ramp 6: Modified independent (Device) (cane)  Curb 6: Modified independent (Device/increase time) (cane)  Functional Gait  Assessment  Gait assessed  Yes  Gait Level Surface 2  Change in Gait Speed 2  Gait with Horizontal Head  Turns 2  Gait with Vertical Head Turns 2  Gait and Pivot Turn 1  Step Over Obstacle 1  Gait with Narrow Base of Support 0  Gait with Eyes Closed 1  Ambulating Backwards 1  Steps 2  Total Score 14   Patient verbalized understanding of fall prevention strategies.                         08/03/15 1150  Plan  Clinical Impression Statement Patient met all LTGs. Patient self reports using FOTO improvement  in functional status and activities of balance confidence. Functional Gait Assessment of 14/30 (<19/30) indicates fall risk but improved by higher score.  Patient verbalizes fall prevention strategies.   Pt will benefit from skilled therapeutic intervention in order to improve on the following deficits Abnormal gait;Cardiopulmonary status limiting activity;Decreased activity tolerance;Decreased balance;Decreased endurance;Decreased knowledge of use of DME;Decreased mobility;Decreased safety awareness;Decreased strength;Impaired flexibility;Postural dysfunction  Rehab Potential Good  PT Frequency 2x / week  PT Duration Other (comment) (9 weeks (60 days))  PT Treatment/Interventions ADLs/Self Care Home Management;DME Instruction;Gait training;Functional mobility training;Stair training;Therapeutic activities;Therapeutic exercise;Balance training;Neuromuscular re-education;Patient/family education;Vestibular  PT Next Visit Plan discharge  PT Home Exercise Plan Reviewed tandem stance at sink.  Consulted and Agree with Plan of Care Patient           PT Short Term Goals - 07/04/15 1357    PT SHORT TERM GOAL #1   Title Patient demonstrates understanding of initial HEP (Target Date: 07/07/2015)   Time 1   Period Months   Status On-going   PT SHORT TERM GOAL #2   Title Patient reaches 7" anteriorly, across midline, laterally & to ground safely with no balance losses. (Target Date: 07/07/2015)   Baseline Goal MET 07/04/15 with 8" or more in each direction with no LOB.   Time 1   Period Months   Status Achieved   PT SHORT TERM GOAL #3   Title Patient ambulates with straight cane with head turns to scan environment without balance losses.  (Target Date: 07/07/2015)   Baseline As of 07/04/15 patient ambulates with straight cane with head turns vertical and horizontal with no LOB but struggles with diagonals.   Time 1   Period Months   Status On-going   PT SHORT TERM GOAL #4   Title Patient ambulates  400' with straight cane with supervision.  (Target Date: 07/07/2015)   Baseline As of 07/04/15 patient could ambulate 400' with straight cane with min guard to supervision.   Time 1   Period Months   Status On-going   PT SHORT TERM GOAL #5   Title Patient negotiates ramps & curbs with straight cane with min guard assist.  (Target Date: 07/07/2015)   Baseline Goal MET 07/04/15 patient negotiated ramps and curbs with straight cain with min guard to supervision.   Time 1   Period Months   Status Achieved           PT Long Term Goals - 08/03/15 1150    PT LONG TERM GOAL #1   Title Patient demonstrates & verbalizes understanding of ongoing HEP / fitness plan. (Target Date: 08/05/2015)   Baseline 08/01/15: has started back on fitness center, plans to return to 5x/week at lesser intensity than he did prior to therapy. Also doing his HEP for balance, stretching and strengthening at home.    Status Achieved   PT LONG TERM GOAL #2   Title Berg Balance >45/56 to indicate lower  fall risk. (Target Date: 08/05/2015)   Baseline 08/01/15: scored 48/56 today   Status Achieved   PT LONG TERM GOAL #3   Title Functional Gait Assessment >/= 12/30 to indicate lower fall risk. (Target Date: 08/05/2015)   Baseline MET 2105-08-15 FGA 14/30   Time 2   Period Months   Status Achieved   PT LONG TERM GOAL #4   Title Patient verbalizes fall prevention strategies. (Target Date: 08/05/2015)   Baseline MET   Time 2   Status Achieved   PT LONG TERM GOAL #5   Title ABC scale improved >10% to indicate greater confidence with balance activites. (Target Date: 08/05/2015)   Baseline MET 2015-08-16 ABC improved to 60% from 10.6%   Time 2   Period Months   Status Achieved   PT LONG TERM GOAL #6   Title Patient ambulates 500' with straight cane outside including grass, ramps & curbs modified independent for safe community mobility. (Target Date: 08/05/2015)   Baseline MET 2015-08-16 with no balance losses   Status Achieved          August 16, 2015 1155  PT G-Codes  Functional Assessment Tool Used Functional Gait Assessment 14/30, Berg Balance 48/56  Functional Limitation Mobility: Walking and moving around  Mobility: Walking and Moving Around Goal Status [phone]) CJ  Mobility: Walking and Moving Around Discharge Status [phone]) CJ         Patient will benefit from skilled therapeutic intervention in order to improve the following deficits and impairments:  Abnormal gait, Cardiopulmonary status limiting activity, Decreased activity tolerance, Decreased balance, Decreased endurance, Decreased knowledge of use of DME, Decreased mobility, Decreased safety awareness, Decreased strength, Impaired flexibility, Postural dysfunction  Visit Diagnosis: Unsteadiness on feet  Other abnormalities of gait and mobility  Repeated falls     Problem List Patient Active Problem List   Diagnosis Date Noted  . Chronic systolic heart failure ([place])   . S/P CABG x 2   . Chronic kidney disease (CKD)   . Fatigue 11/11/2014  . NSVT (nonsustained ventricular tachycardia) ([place]) 11/11/2014  . LV dysfunction 07/23/2014  . Hyperlipidemia 07/27/2013  . Dizzy spells 10/24/2011  . CAD (coronary artery disease) 03/30/2011  . PVC's (premature ventricular contractions) 03/30/2011  . ANEMIA, IRON DEFICIENCY 03/12/2008  . GERD 03/12/2008    Hetvi Shawhan PT, DPT 08/04/2015, 8:54 PM  [facility] [address] [place], [place], [address] Phone: [phone]   Fax:  [phone]  Name: Gary Gonzalez MRN: [id number] Date of Birth: 11/07/1926   PHYSICAL THERAPY DISCHARGE SUMMARY  Visits from Start of Care: 18  Current functional level related to goals / functional outcomes: See above   Remaining deficits: See above   Education / Equipment: HEP / ongoing fitness plan including monitoring activity tolerance, fall prevention strategies.  Plan: Patient agrees to discharge.  Patient  goals were met. Patient is being discharged due to meeting the stated rehab goals.  ?????

## 2015-08-08 NOTE — Addendum Note (Signed)
Addended by: Isaias Cowman on: 08/08/2015 07:33 AM   Modules accepted: Orders

## 2015-09-02 ENCOUNTER — Telehealth: Payer: Self-pay | Admitting: Internal Medicine

## 2015-09-02 DIAGNOSIS — I493 Ventricular premature depolarization: Secondary | ICD-10-CM

## 2015-09-02 NOTE — Telephone Encounter (Signed)
Spoke with pt, he is having side effects from the increased of amiodarone again. Hr is having fatigue, loss of appetite, dizziness and feeling off balance. He had these sym,ptoms before when on the 200 mg daily dosing of the amiodarone, they were better on 100 mg daily. His amiodarone dose was increased at his last ov because of PVC's. Pt thinks his PVC's maybe better than the side affects. Will forward for dr taylor's review and advise

## 2015-09-02 NOTE — Telephone Encounter (Signed)
Nwe Message  Pt requested to speak w/ RN concerning his meds- pt could not recall name of med, Please call back and discuss.

## 2015-09-07 MED ORDER — AMIODARONE HCL 200 MG PO TABS: 100.0000 mg | ORAL_TABLET | Freq: Every day | ORAL | Status: DC

## 2015-09-07 NOTE — Addendum Note (Signed)
Addended by: Janan Halter F on: 09/07/2015 11:28 AM   Modules accepted: Orders

## 2015-09-07 NOTE — Telephone Encounter (Signed)
Discussed with Dr [name].  Okay to decrease Amiodarone back to 100 mg daily.  Patient is aware

## 2016-01-31 DIAGNOSIS — E875 Hyperkalemia: Secondary | ICD-10-CM | POA: Diagnosis not present

## 2016-01-31 DIAGNOSIS — E784 Other hyperlipidemia: Secondary | ICD-10-CM | POA: Diagnosis not present

## 2016-01-31 DIAGNOSIS — I493 Ventricular premature depolarization: Secondary | ICD-10-CM | POA: Diagnosis not present

## 2016-01-31 DIAGNOSIS — I2510 Atherosclerotic heart disease of native coronary artery without angina pectoris: Secondary | ICD-10-CM | POA: Diagnosis not present

## 2016-01-31 DIAGNOSIS — R296 Repeated falls: Secondary | ICD-10-CM | POA: Diagnosis not present

## 2016-01-31 DIAGNOSIS — Z6820 Body mass index (BMI) 20.0-20.9, adult: Secondary | ICD-10-CM | POA: Diagnosis not present

## 2016-01-31 DIAGNOSIS — Z23 Encounter for immunization: Secondary | ICD-10-CM | POA: Diagnosis not present

## 2016-01-31 DIAGNOSIS — I509 Heart failure, unspecified: Secondary | ICD-10-CM | POA: Diagnosis not present

## 2016-01-31 DIAGNOSIS — R7301 Impaired fasting glucose: Secondary | ICD-10-CM | POA: Diagnosis not present

## 2016-01-31 DIAGNOSIS — E038 Other specified hypothyroidism: Secondary | ICD-10-CM | POA: Diagnosis not present

## 2016-02-13 DIAGNOSIS — L570 Actinic keratosis: Secondary | ICD-10-CM | POA: Diagnosis not present

## 2016-02-13 DIAGNOSIS — L821 Other seborrheic keratosis: Secondary | ICD-10-CM | POA: Diagnosis not present

## 2016-02-13 DIAGNOSIS — D2261 Melanocytic nevi of right upper limb, including shoulder: Secondary | ICD-10-CM | POA: Diagnosis not present

## 2016-02-13 DIAGNOSIS — Z85828 Personal history of other malignant neoplasm of skin: Secondary | ICD-10-CM | POA: Diagnosis not present

## 2016-02-21 ENCOUNTER — Encounter: Payer: Self-pay | Admitting: Internal Medicine

## 2016-03-02 ENCOUNTER — Ambulatory Visit (INDEPENDENT_AMBULATORY_CARE_PROVIDER_SITE_OTHER): Payer: PPO | Admitting: Internal Medicine

## 2016-03-02 ENCOUNTER — Encounter: Payer: Self-pay | Admitting: Internal Medicine

## 2016-03-02 VITALS — BP 144/64 | HR 70 | Ht 72.0 in | Wt 151.2 lb

## 2016-03-02 DIAGNOSIS — R42 Dizziness and giddiness: Secondary | ICD-10-CM

## 2016-03-02 DIAGNOSIS — I493 Ventricular premature depolarization: Secondary | ICD-10-CM | POA: Diagnosis not present

## 2016-03-02 NOTE — Progress Notes (Signed)
HPI Gary Gonzalez returns today for followup of his PVC's. He is a pleasant 82 yo man with a cardiomyopathy thought due to PVC's who had nice suppresion with his PVC's on amiodarone. He has his dose reduced because it was thought that he had worsening dizziness with the amiodarone. He has had no palpitations. He continues to feel dizzy. No syncope. No palpitations. Allergies  Allergen Reactions  . Atenolol Diarrhea and Other (See Comments)    Very weak, fatigued, lethargic, no appetite     Current Outpatient Prescriptions  Medication Sig Dispense Refill  . aspirin 81 MG tablet Take 81 mg by mouth daily.    Marland Kitchen ezetimibe (ZETIA) 10 MG tablet Take 10 mg by mouth daily.    Marland Kitchen levothyroxine (SYNTHROID, LEVOTHROID) 75 MCG tablet Take 75 mcg by mouth daily before breakfast.     . mirtazapine (REMERON) 7.5 MG tablet Take 7.5 mg by mouth at bedtime.     No current facility-administered medications for this visit.      Past Medical History:  Diagnosis Date  . Anemia   . Arthritis    "touch in the fingers" (11/17/2014)  . Chronic combined systolic and diastolic CHF (congestive heart failure) ([place])    a. 11/17/14 showed EF 25-30%, suboptimal image quality, diffuse hypokinesis worse in the inferior wall, mild to moderate MR. New drop EF compared to 2012 but cath was stable.  . CKD (chronic kidney disease), stage III   . Coronary artery disease    a. s/p 2v CABG (seq LIMA to Diag and LAD, SVG to OM) on 05/15/2001.  . Fibromyalgia    "a long time ago" (11/17/2014)  . History of gout   . Hyperlipidemia   . Hypothyroidism   . LBBB (left bundle branch block)   . Melanoma of back ([place])   . NSVT (nonsustained ventricular tachycardia) ([place])   . PMR (polymyalgia rheumatica) ([facility])   . PVC's (premature ventricular contractions)   . Sleep apnea    "wife says I do" (11/18/2014)    ROS:   All systems reviewed and negative except as noted in the HPI.   Past Surgical History:  Procedure Laterality  Date  . AXILLARY LYMPH NODE DISSECTION Bilateral 2001  . CARDIAC CATHETERIZATION  04/28/2001  . CARDIAC CATHETERIZATION N/A 11/19/2014   Procedure: Right/Left Heart Cath and Coronary/Graft Angiography;  Surgeon: Jettie Booze, MD;  Location: [facility];  Service: Cardiovascular;  Laterality: N/A;  . CATARACT EXTRACTION W/ INTRAOCULAR LENS  IMPLANT, BILATERAL Bilateral ~ 2010  . CORONARY ARTERY BYPASS GRAFT  05/15/2001   x3 sequential LIMA to diag and LAD, SVG to OM   . MELANOMA EXCISION  2001   off of back     Family History  Problem Relation Age of Onset  . Prostate cancer Father   . Liver cancer Mother   . Hyperlipidemia Son   . Hyperlipidemia Daughter      Social History   Social History  . Marital status: Married    Spouse name: N/A  . Number of children: N/A  . Years of education: N/A   Occupational History  . Not on file.   Social History Main Topics  . Smoking status: Never Smoker  . Smokeless tobacco: Never Used  . Alcohol use 3.6 oz/week    3 Glasses of wine, 3 Cans of beer per week  . Drug use: No  . Sexual activity: No   Other Topics Concern  . Not  on file   Social History Narrative  . No narrative on file     BP (!) 144/64   Pulse 70   Ht 6' (1.829 m)   Wt 151 lb 3.2 oz (68.6 kg)   BMI 20.51 kg/m   Physical Exam:  Well appearing elderly man, NAD HEENT: Unremarkable Neck:  6 cm JVD, no thyromegally Back:  No CVA tenderness Lungs:  Clear with no wheezes HEART:  IRegular rate rhythm, no murmurs, no rubs, no clicks Abd:  soft, positive bowel sounds, no organomegally, no rebound, no guarding Ext:  2 plus pulses, no edema, no cyanosis, no clubbing Skin:  No rashes no nodules Neuro:  CN II through XII intact, motor grossly intact  ECG - NSR with frequent and consecutive PVC's.  Assess/Plan: 1. CAD, s/p CABG - he is angina free. Will follow. 2. PVC's - his PVC's appear to have improved. We will stop his amiodarone because he is  still having dizziness 3. Dizziness - still present. I will ask him to hold his amio since his symptoms are controlled to see if his dizziness will improve off of amiodarone. 4. Cardiomyopathy - he has no symptoms of worsening CM.  Gary Gonzalez,M.D.  4. HTN - his blood pressure is stable. Will follow.

## 2016-03-02 NOTE — Patient Instructions (Addendum)
Medication Instructions:  1) STOP Amiodarone   Labwork: None Ordered   Testing/Procedures: None Ordered   Follow-Up: Your physician recommends that you schedule a follow-up appointment in: 6 weeks with Dr. [name].  Any Other Special Instructions Will Be Listed Below (If Applicable).     If you need a refill on your cardiac medications before your next appointment, please call your pharmacy.

## 2016-04-17 ENCOUNTER — Encounter: Payer: Self-pay | Admitting: Internal Medicine

## 2016-04-17 ENCOUNTER — Ambulatory Visit (INDEPENDENT_AMBULATORY_CARE_PROVIDER_SITE_OTHER): Payer: PPO | Admitting: Internal Medicine

## 2016-04-17 VITALS — BP 138/68 | HR 67 | Ht 72.0 in | Wt 152.4 lb

## 2016-04-17 DIAGNOSIS — I493 Ventricular premature depolarization: Secondary | ICD-10-CM

## 2016-04-17 NOTE — Patient Instructions (Addendum)
Medication Instructions:  Your physician recommends that you continue on your current medications as directed. Please refer to the Current Medication list given to you today.   Labwork: None Ordered   Testing/Procedures: Your physician has recommended that you wear a 24 hour holter monitor. Holter monitors are medical devices that record the heart's electrical activity. Doctors most often use these monitors to diagnose arrhythmias. Arrhythmias are problems with the speed or rhythm of the heartbeat. The monitor is a small, portable device. You can wear one while you do your normal daily activities. This is usually used to diagnose what is causing palpitations/syncope (passing out).    Follow-Up: Your physician recommends that you schedule a follow-up appointment in: 3 months with Dr. [name]   Any Other Special Instructions Will Be Listed Below (If Applicable). 1) Take blood pressure and heart rate every day 2) If heart rate sustains below 50, call our office at [phone]    If you need a refill on your cardiac medications before your next appointment, please call your pharmacy.

## 2016-04-17 NOTE — Progress Notes (Signed)
HPI Gary Gonzalez returns today for followup of his PVC's. He is a pleasant 82 yo man with a cardiomyopathy thought due to PVC's who had nice suppresion with his PVC's on amiodarone. He has his dose reduced because it was thought that he had worsening dizziness with the amiodarone. He has had no palpitations. When I last sway him about 6 weeks ago, we decided to have him stop taking the amiodarone. He feels better. His wife states that his energy level has improved.  Allergies  Allergen Reactions  . Atenolol Diarrhea and Other (See Comments)    Very weak, fatigued, lethargic, no appetite     Current Outpatient Prescriptions  Medication Sig Dispense Refill  . aspirin 81 MG tablet Take 81 mg by mouth daily.    Marland Kitchen ezetimibe (ZETIA) 10 MG tablet Take 10 mg by mouth daily.    Marland Kitchen levothyroxine (SYNTHROID, LEVOTHROID) 125 MCG tablet Take 1 tablet by mouth daily before breakfast.    . mirtazapine (REMERON) 7.5 MG tablet Take 7.5 mg by mouth at bedtime.     No current facility-administered medications for this visit.      Past Medical History:  Diagnosis Date  . Anemia   . Arthritis    "touch in the fingers" (11/17/2014)  . Chronic combined systolic and diastolic CHF (congestive heart failure) ([place])    a. 11/17/14 showed EF 25-30%, suboptimal image quality, diffuse hypokinesis worse in the inferior wall, mild to moderate MR. New drop EF compared to 2012 but cath was stable.  . CKD (chronic kidney disease), stage III   . Coronary artery disease    a. s/p 2v CABG (seq LIMA to Diag and LAD, SVG to OM) on 05/15/2001.  . Fibromyalgia    "a long time ago" (11/17/2014)  . History of gout   . Hyperlipidemia   . Hypothyroidism   . LBBB (left bundle branch block)   . Melanoma of back ([place])   . NSVT (nonsustained ventricular tachycardia) ([place])   . PMR (polymyalgia rheumatica) ([facility])   . PVC's (premature ventricular contractions)   . Sleep apnea    "wife says I do" (11/18/2014)    ROS:   All  systems reviewed and negative except as noted in the HPI.   Past Surgical History:  Procedure Laterality Date  . AXILLARY LYMPH NODE DISSECTION Bilateral 2001  . CARDIAC CATHETERIZATION  04/28/2001  . CARDIAC CATHETERIZATION N/A 11/19/2014   Procedure: Right/Left Heart Cath and Coronary/Graft Angiography;  Surgeon: Jettie Booze, MD;  Location: [facility];  Service: Cardiovascular;  Laterality: N/A;  . CATARACT EXTRACTION W/ INTRAOCULAR LENS  IMPLANT, BILATERAL Bilateral ~ 2010  . CORONARY ARTERY BYPASS GRAFT  05/15/2001   x3 sequential LIMA to diag and LAD, SVG to OM   . MELANOMA EXCISION  2001   off of back     Family History  Problem Relation Age of Onset  . Prostate cancer Father   . Liver cancer Mother   . Hyperlipidemia Son   . Hyperlipidemia Daughter      Social History   Social History  . Marital status: Married    Spouse name: N/A  . Number of children: N/A  . Years of education: N/A   Occupational History  . Not on file.   Social History Main Topics  . Smoking status: Never Smoker  . Smokeless tobacco: Never Used  . Alcohol use 3.6 oz/week    3 Glasses of wine, 3 Cans of  beer per week  . Drug use: No  . Sexual activity: No   Other Topics Concern  . Not on file   Social History Narrative  . No narrative on file     BP 138/68   Pulse 67   Ht 6' (1.829 m)   Wt 152 lb 6.4 oz (69.1 kg)   BMI 20.67 kg/m   Physical Exam:  Well appearing elderly man, NAD HEENT: Unremarkable Neck:  6 cm JVD, no thyromegally Back:  No CVA tenderness Lungs:  Clear with no wheezes HEART:  IRegular rate rhythm, no murmurs, no rubs, no clicks Abd:  soft, positive bowel sounds, no organomegally, no rebound, no guarding Ext:  2 plus pulses, no edema, no cyanosis, no clubbing Skin:  No rashes no nodules Neuro:  CN II through XII intact, motor grossly intact  ECG - NSR with rare PVC's.  Assess/Plan: 1. CAD, s/p CABG - he is angina free. Will follow. 2.  PVC's - his PVC's appear to have improved despite stopping amiodarone. I have asked him to wear a 24 hour holter in 3 months and I will see him and reassess after he has worn the cardiac monitor. 3. Dizziness - still present but much improved after stopping amiodarone 4. HTN - his blood pressure is stable. Will follow.   Gary Gonzalez.D.

## 2016-04-25 ENCOUNTER — Ambulatory Visit (INDEPENDENT_AMBULATORY_CARE_PROVIDER_SITE_OTHER): Payer: PPO

## 2016-04-25 DIAGNOSIS — I493 Ventricular premature depolarization: Secondary | ICD-10-CM

## 2016-05-25 DIAGNOSIS — H5000 Unspecified esotropia: Secondary | ICD-10-CM | POA: Diagnosis not present

## 2016-05-25 DIAGNOSIS — Z961 Presence of intraocular lens: Secondary | ICD-10-CM | POA: Diagnosis not present

## 2016-05-25 DIAGNOSIS — H52203 Unspecified astigmatism, bilateral: Secondary | ICD-10-CM | POA: Diagnosis not present

## 2016-07-17 ENCOUNTER — Encounter: Payer: Self-pay | Admitting: Internal Medicine

## 2016-07-17 ENCOUNTER — Ambulatory Visit (INDEPENDENT_AMBULATORY_CARE_PROVIDER_SITE_OTHER): Payer: PPO | Admitting: Internal Medicine

## 2016-07-17 VITALS — BP 138/52 | HR 70 | Ht 72.0 in | Wt 153.0 lb

## 2016-07-17 DIAGNOSIS — I493 Ventricular premature depolarization: Secondary | ICD-10-CM | POA: Diagnosis not present

## 2016-07-17 MED ORDER — AMIODARONE HCL 200 MG PO TABS: 100.0000 mg | ORAL_TABLET | Freq: Every day | ORAL | 3 refills | Status: DC

## 2016-07-17 NOTE — Patient Instructions (Addendum)
Medication Instructions:  START amiodarone 100 mg daily. Take 1/2 tablet daily.   Labwork: None ordered  Testing/Procedures: Your physician has recommended that you wear a holter monitor. Holter monitors are medical devices that record the heart's electrical activity. Doctors most often use these monitors to diagnose arrhythmias. Arrhythmias are problems with the speed or rhythm of the heartbeat. The monitor is a small, portable device. You can wear one while you do your normal daily activities. This is usually used to diagnose what is causing palpitations/syncope (passing out).    Follow-Up: Your physician recommends that you schedule a follow-up appointment in: 3 weeks for a NURSE VISIT for a repeat EKG.  Your physician recommends that you schedule a follow-up appointment in: June with Dr. [name].   Any Other Special Instructions Will Be Listed Below (If Applicable).     If you need a refill on your cardiac medications before your next appointment, please call your pharmacy.

## 2016-07-17 NOTE — Progress Notes (Signed)
HPI [name] returns today for followup of his PVC's. He is a pleasant 83 yo man with a cardiomyopathy thought due to PVC's who had nice suppresion with his PVC's on amiodarone. He had his dose reduced because it was thought that he had worsening dizziness with the amiodarone. His amio was actually stopped and he initially felt well but over the past few months has had increasing fatigue and palpitations. When we saw him in December, he was having about 10,000 PVC"s in 24 hours.   Allergies  Allergen Reactions  . Atenolol Diarrhea and Other (See Comments)    Very weak, fatigued, lethargic, no appetite     Current Outpatient Prescriptions  Medication Sig Dispense Refill  . aspirin 81 MG tablet Take 81 mg by mouth daily.    Marland Kitchen ezetimibe (ZETIA) 10 MG tablet Take 10 mg by mouth daily.    Marland Kitchen levothyroxine (SYNTHROID, LEVOTHROID) 125 MCG tablet Take 1 tablet by mouth daily before breakfast.    . mirtazapine (REMERON) 7.5 MG tablet Take 7.5 mg by mouth at bedtime.    Marland Kitchen amiodarone (PACERONE) 200 MG tablet Take 0.5 tablets (100 mg total) by mouth daily. 90 tablet 3   No current facility-administered medications for this visit.      Past Medical History:  Diagnosis Date  . Anemia   . Arthritis    "touch in the fingers" (11/17/2014)  . Chronic combined systolic and diastolic CHF (congestive heart failure) ([place])    a. 11/17/14 showed EF 25-30%, suboptimal image quality, diffuse hypokinesis worse in the inferior wall, mild to moderate MR. New drop EF compared to 2012 but cath was stable.  . CKD (chronic kidney disease), stage III   . Coronary artery disease    a. s/p 2v CABG (seq LIMA to Diag and LAD, SVG to OM) on 05/15/2001.  . Fibromyalgia    "a long time ago" (11/17/2014)  . History of gout   . Hyperlipidemia   . Hypothyroidism   . LBBB (left bundle branch block)   . Melanoma of back ([place])   . NSVT (nonsustained ventricular tachycardia) ([place])   . PMR (polymyalgia rheumatica) ([facility])    . PVC's (premature ventricular contractions)   . Sleep apnea    "wife says I do" (11/18/2014)    ROS:   All systems reviewed and negative except as noted in the HPI.   Past Surgical History:  Procedure Laterality Date  . AXILLARY LYMPH NODE DISSECTION Bilateral 2001  . CARDIAC CATHETERIZATION  04/28/2001  . CARDIAC CATHETERIZATION N/A 11/19/2014   Procedure: Right/Left Heart Cath and Coronary/Graft Angiography;  Surgeon: Jettie Booze, MD;  Location: [facility];  Service: Cardiovascular;  Laterality: N/A;  . CATARACT EXTRACTION W/ INTRAOCULAR LENS  IMPLANT, BILATERAL Bilateral ~ 2010  . CORONARY ARTERY BYPASS GRAFT  05/15/2001   x3 sequential LIMA to diag and LAD, SVG to OM   . MELANOMA EXCISION  2001   off of back     Family History  Problem Relation Age of Onset  . Prostate cancer Father   . Liver cancer Mother   . Hyperlipidemia Son   . Hyperlipidemia Daughter      Social History   Social History  . Marital status: Married    Spouse name: N/A  . Number of children: N/A  . Years of education: N/A   Occupational History  . Not on file.   Social History Main Topics  . Smoking status: Never Smoker  .  Smokeless tobacco: Never Used  . Alcohol use 3.6 oz/week    3 Glasses of wine, 3 Cans of beer per week  . Drug use: No  . Sexual activity: No   Other Topics Concern  . Not on file   Social History Narrative  . No narrative on file     BP (!) 138/52   Pulse 70   Ht 6' (1.829 m)   Wt 153 lb (69.4 kg)   SpO2 93%   BMI 20.75 kg/m   Physical Exam:  Well appearing elderly man, NAD HEENT: Unremarkable Neck:  6 cm JVD, no thyromegally Back:  No CVA tenderness Lungs:  Clear with no wheezes HEART:  IRegular rate rhythm, no murmurs, no rubs, no clicks Abd:  soft, positive bowel sounds, no organomegally, no rebound, no guarding Ext:  2 plus pulses, no edema, no cyanosis, no clubbing Skin:  No rashes no nodules Neuro:  CN II through XII  intact, motor grossly intact  ECG - NSR with frequent PVC's and couplets.  Assess/Plan: 1. CAD, s/p CABG - he is angina free. Will follow. 2. PVC's - his PVC's appear to have worsened since stopping amiodarone. I have asked him to restart low dose amiodarone. We also discussed catheter ablation. For now he would like to try amiodarone despite his feeling poorly on this medicine previously. 3. Dizziness - still present but much improved after stopping amiodarone 4. HTN - his blood pressure is stable. Will follow.   Mikle Bosworth.D.

## 2016-08-08 ENCOUNTER — Ambulatory Visit (INDEPENDENT_AMBULATORY_CARE_PROVIDER_SITE_OTHER): Payer: PPO

## 2016-08-08 VITALS — BP 142/50 | HR 68 | Resp 18 | Ht 72.0 in | Wt 152.0 lb

## 2016-08-08 DIAGNOSIS — I493 Ventricular premature depolarization: Secondary | ICD-10-CM

## 2016-08-08 DIAGNOSIS — N183 Chronic kidney disease, stage 3 (moderate): Secondary | ICD-10-CM | POA: Diagnosis not present

## 2016-08-08 DIAGNOSIS — M109 Gout, unspecified: Secondary | ICD-10-CM | POA: Diagnosis not present

## 2016-08-08 DIAGNOSIS — E784 Other hyperlipidemia: Secondary | ICD-10-CM | POA: Diagnosis not present

## 2016-08-08 DIAGNOSIS — Z79899 Other long term (current) drug therapy: Secondary | ICD-10-CM | POA: Diagnosis not present

## 2016-08-08 DIAGNOSIS — E038 Other specified hypothyroidism: Secondary | ICD-10-CM | POA: Diagnosis not present

## 2016-08-08 DIAGNOSIS — R7301 Impaired fasting glucose: Secondary | ICD-10-CM | POA: Diagnosis not present

## 2016-08-08 DIAGNOSIS — Z125 Encounter for screening for malignant neoplasm of prostate: Secondary | ICD-10-CM | POA: Diagnosis not present

## 2016-08-08 NOTE — Patient Instructions (Signed)
No changes at this time.  Please follow up with scheduled holter monitor and office visit as planned.

## 2016-08-08 NOTE — Progress Notes (Signed)
1.) Reason for visit: EKG  2.) Name of MD requesting visit: Dr. [name]  3.) H&P: Patient has history of PVC's. At last office visit PVC's were worse, so Dr. [name] put patient back on amiodarone at a low dose.  4.) ROS related to problem: Patient's vital signs BP 142/50, HR 68, and Resp 18. Patient's wt 152 lbs and ht 6'. Patient's EKG showed less PVC's. Patient denied any problems with taking amiodarone at a decreased dose.   5.) Assessment and plan per MD: Dr. [name] reviewed EKG and recommend no changes and for patient to continue amiodarone at 100 mg by mouth daily. Patient has follow-up holter monitor scheduled on 10/08/16 and office visit on 10/19/16 with Dr. [name].

## 2016-08-15 DIAGNOSIS — B029 Zoster without complications: Secondary | ICD-10-CM | POA: Diagnosis not present

## 2016-08-15 DIAGNOSIS — R296 Repeated falls: Secondary | ICD-10-CM | POA: Diagnosis not present

## 2016-08-15 DIAGNOSIS — R634 Abnormal weight loss: Secondary | ICD-10-CM | POA: Diagnosis not present

## 2016-08-15 DIAGNOSIS — Z1389 Encounter for screening for other disorder: Secondary | ICD-10-CM | POA: Diagnosis not present

## 2016-08-15 DIAGNOSIS — Z0000 Encounter for general adult medical examination without abnormal findings: Secondary | ICD-10-CM | POA: Diagnosis not present

## 2016-08-15 DIAGNOSIS — C439 Malignant melanoma of skin, unspecified: Secondary | ICD-10-CM | POA: Diagnosis not present

## 2016-08-15 DIAGNOSIS — E875 Hyperkalemia: Secondary | ICD-10-CM | POA: Diagnosis not present

## 2016-08-15 DIAGNOSIS — G6289 Other specified polyneuropathies: Secondary | ICD-10-CM | POA: Diagnosis not present

## 2016-08-15 DIAGNOSIS — E291 Testicular hypofunction: Secondary | ICD-10-CM | POA: Diagnosis not present

## 2016-08-15 DIAGNOSIS — I2510 Atherosclerotic heart disease of native coronary artery without angina pectoris: Secondary | ICD-10-CM | POA: Diagnosis not present

## 2016-08-15 DIAGNOSIS — I493 Ventricular premature depolarization: Secondary | ICD-10-CM | POA: Diagnosis not present

## 2016-08-15 DIAGNOSIS — N183 Chronic kidney disease, stage 3 (moderate): Secondary | ICD-10-CM | POA: Diagnosis not present

## 2016-09-14 DIAGNOSIS — D1801 Hemangioma of skin and subcutaneous tissue: Secondary | ICD-10-CM | POA: Diagnosis not present

## 2016-09-14 DIAGNOSIS — Z85828 Personal history of other malignant neoplasm of skin: Secondary | ICD-10-CM | POA: Diagnosis not present

## 2016-09-14 DIAGNOSIS — D225 Melanocytic nevi of trunk: Secondary | ICD-10-CM | POA: Diagnosis not present

## 2016-09-14 DIAGNOSIS — L570 Actinic keratosis: Secondary | ICD-10-CM | POA: Diagnosis not present

## 2016-09-14 DIAGNOSIS — L812 Freckles: Secondary | ICD-10-CM | POA: Diagnosis not present

## 2016-09-14 DIAGNOSIS — Z85820 Personal history of malignant melanoma of skin: Secondary | ICD-10-CM | POA: Diagnosis not present

## 2016-09-14 DIAGNOSIS — L821 Other seborrheic keratosis: Secondary | ICD-10-CM | POA: Diagnosis not present

## 2016-10-08 ENCOUNTER — Ambulatory Visit (INDEPENDENT_AMBULATORY_CARE_PROVIDER_SITE_OTHER): Payer: PPO

## 2016-10-08 DIAGNOSIS — I493 Ventricular premature depolarization: Secondary | ICD-10-CM | POA: Diagnosis not present

## 2016-10-19 ENCOUNTER — Encounter: Payer: Self-pay | Admitting: Internal Medicine

## 2016-10-19 ENCOUNTER — Ambulatory Visit (INDEPENDENT_AMBULATORY_CARE_PROVIDER_SITE_OTHER): Payer: PPO | Admitting: Internal Medicine

## 2016-10-19 VITALS — BP 140/62 | HR 66 | Ht 72.0 in | Wt 141.0 lb

## 2016-10-19 DIAGNOSIS — I2583 Coronary atherosclerosis due to lipid rich plaque: Secondary | ICD-10-CM

## 2016-10-19 DIAGNOSIS — I5022 Chronic systolic (congestive) heart failure: Secondary | ICD-10-CM | POA: Diagnosis not present

## 2016-10-19 DIAGNOSIS — I493 Ventricular premature depolarization: Secondary | ICD-10-CM

## 2016-10-19 DIAGNOSIS — I2510 Atherosclerotic heart disease of native coronary artery without angina pectoris: Secondary | ICD-10-CM

## 2016-10-19 MED ORDER — AMIODARONE HCL 200 MG PO TABS: ORAL_TABLET | ORAL | 2 refills | Status: DC

## 2016-10-19 NOTE — Patient Instructions (Addendum)
Medication Instructions:  Your physician has recommended you make the following change in your medication:  DECREASE Amiodarone to 1/2 tablet (100 mg) daily Monday - Friday, do not take on Saturday and Sunday.  Labwork: None Ordered   Testing/Procedures: None Ordered   Follow-Up: Your physician wants you to follow-up in: 4 months with Dr. [name]. You will receive a reminder letter in the mail two months in advance. If you don't receive a letter, please call our office to schedule the follow-up appointment.    Any Other Special Instructions Will Be Listed Below (If Applicable).     If you need a refill on your cardiac medications before your next appointment, please call your pharmacy.

## 2016-10-19 NOTE — Progress Notes (Signed)
HPI [name] returns today for followup of his PVC's. He is a pleasant 83 yo man with a cardiomyopathy thought due to PVC's who had nice suppresion with his PVC's on amiodarone. He developed fatigue and weakness on amio and we had to stop it. Then his PVC's returned. 3 months ago, I started him on low dose amiodarone. He has been stable.His wife still thinks that he is fatigued. He has not felt any symptomatic PVCs. He has not had syncope. No chest pain.    Allergies  Allergen Reactions  . Atenolol Diarrhea and Other (See Comments)    Very weak, fatigued, lethargic, no appetite     Current Outpatient Prescriptions  Medication Sig Dispense Refill  . amiodarone (PACERONE) 200 MG tablet Take 0.5 tablets (100 mg total) by mouth daily. 90 tablet 3  . aspirin 81 MG tablet Take 81 mg by mouth daily.    Marland Kitchen ezetimibe (ZETIA) 10 MG tablet Take 10 mg by mouth daily.    Marland Kitchen levothyroxine (SYNTHROID, LEVOTHROID) 125 MCG tablet Take 1 tablet by mouth daily before breakfast.    . mirtazapine (REMERON) 7.5 MG tablet Take 7.5 mg by mouth at bedtime.     No current facility-administered medications for this visit.      Past Medical History:  Diagnosis Date  . Anemia   . Arthritis    "touch in the fingers" (11/17/2014)  . Chronic combined systolic and diastolic CHF (congestive heart failure) ([place])    a. 11/17/14 showed EF 25-30%, suboptimal image quality, diffuse hypokinesis worse in the inferior wall, mild to moderate MR. New drop EF compared to 2012 but cath was stable.  . CKD (chronic kidney disease), stage III   . Coronary artery disease    a. s/p 2v CABG (seq LIMA to Diag and LAD, SVG to OM) on 05/15/2001.  . Fibromyalgia    "a long time ago" (11/17/2014)  . History of gout   . Hyperlipidemia   . Hypothyroidism   . LBBB (left bundle branch block)   . Melanoma of back ([place])   . NSVT (nonsustained ventricular tachycardia) ([place])   . PMR (polymyalgia rheumatica) ([facility])   . PVC's (premature  ventricular contractions)   . Sleep apnea    "wife says I do" (11/18/2014)    ROS:   All systems reviewed and negative except as noted in the HPI.   Past Surgical History:  Procedure Laterality Date  . AXILLARY LYMPH NODE DISSECTION Bilateral 2001  . CARDIAC CATHETERIZATION  04/28/2001  . CARDIAC CATHETERIZATION N/A 11/19/2014   Procedure: Right/Left Heart Cath and Coronary/Graft Angiography;  Surgeon: Jettie Booze, MD;  Location: [facility];  Service: Cardiovascular;  Laterality: N/A;  . CATARACT EXTRACTION W/ INTRAOCULAR LENS  IMPLANT, BILATERAL Bilateral ~ 2010  . CORONARY ARTERY BYPASS GRAFT  05/15/2001   x3 sequential LIMA to diag and LAD, SVG to OM   . MELANOMA EXCISION  2001   off of back     Family History  Problem Relation Age of Onset  . Prostate cancer Father   . Liver cancer Mother   . Hyperlipidemia Son   . Hyperlipidemia Daughter      Social History   Social History  . Marital status: Married    Spouse name: N/A  . Number of children: N/A  . Years of education: N/A   Occupational History  . Not on file.   Social History Main Topics  . Smoking status: Never Smoker  .  Smokeless tobacco: Never Used  . Alcohol use 3.6 oz/week    3 Glasses of wine, 3 Cans of beer per week  . Drug use: No  . Sexual activity: No   Other Topics Concern  . Not on file   Social History Narrative  . No narrative on file     BP 140/62   Pulse 66   Ht 6' (1.829 m)   Wt 141 lb (64 kg)   SpO2 97%   BMI 19.12 kg/m   Physical Exam:  Well appearing elderly man, NAD HEENT: Unremarkable Neck:  6 cm JVD, no thyromegally Back:  No CVA tenderness Lungs:  Clear with no wheezes HEART:  Regular rate rhythm, no murmurs, no rubs, no clicks Abd:  soft, positive bowel sounds, no organomegally, no rebound, no guarding Ext:  2 plus pulses, no edema, no cyanosis, no clubbing Skin:  No rashes no nodules Neuro:  CN II through XII intact, motor grossly  intact  ECG - NSR with IVCD  Assess/Plan: 1. CAD, s/p CABG - he is angina free. Will follow. 2. PVC's - his PVC's are much better. He wore a holter a couple of weeks ago and had 50 in 24 hours.  3. Dizziness - his symptoms are minimal. Will follow. 4. HTN - his blood pressure is a little elevated. Will follow.   Mikle Bosworth.D.

## 2016-10-29 ENCOUNTER — Observation Stay (HOSPITAL_COMMUNITY)
Admission: EM | Admit: 2016-10-29 | Discharge: 2016-10-29 | Disposition: A | Discharge status: Home / Self Care | Payer: PPO | Attending: Internal Medicine | Admitting: Internal Medicine

## 2016-10-29 ENCOUNTER — Other Ambulatory Visit: Payer: Self-pay | Admitting: Physician Assistant

## 2016-10-29 ENCOUNTER — Encounter (HOSPITAL_COMMUNITY): Payer: Self-pay | Admitting: Emergency Medicine

## 2016-10-29 ENCOUNTER — Emergency Department (HOSPITAL_COMMUNITY): Payer: PPO

## 2016-10-29 ENCOUNTER — Observation Stay (HOSPITAL_BASED_OUTPATIENT_CLINIC_OR_DEPARTMENT_OTHER): Payer: PPO

## 2016-10-29 DIAGNOSIS — Z8042 Family history of malignant neoplasm of prostate: Secondary | ICD-10-CM | POA: Insufficient documentation

## 2016-10-29 DIAGNOSIS — R079 Chest pain, unspecified: Secondary | ICD-10-CM

## 2016-10-29 DIAGNOSIS — Z79899 Other long term (current) drug therapy: Secondary | ICD-10-CM | POA: Insufficient documentation

## 2016-10-29 DIAGNOSIS — Z800 Family history of malignant neoplasm of digestive organs: Secondary | ICD-10-CM | POA: Diagnosis not present

## 2016-10-29 DIAGNOSIS — R0789 Other chest pain: Secondary | ICD-10-CM | POA: Diagnosis not present

## 2016-10-29 DIAGNOSIS — F329 Major depressive disorder, single episode, unspecified: Secondary | ICD-10-CM | POA: Diagnosis not present

## 2016-10-29 DIAGNOSIS — R42 Dizziness and giddiness: Secondary | ICD-10-CM | POA: Insufficient documentation

## 2016-10-29 DIAGNOSIS — I2510 Atherosclerotic heart disease of native coronary artery without angina pectoris: Secondary | ICD-10-CM | POA: Diagnosis not present

## 2016-10-29 DIAGNOSIS — I472 Ventricular tachycardia: Secondary | ICD-10-CM | POA: Insufficient documentation

## 2016-10-29 DIAGNOSIS — R531 Weakness: Secondary | ICD-10-CM | POA: Insufficient documentation

## 2016-10-29 DIAGNOSIS — E785 Hyperlipidemia, unspecified: Secondary | ICD-10-CM | POA: Diagnosis present

## 2016-10-29 DIAGNOSIS — E039 Hypothyroidism, unspecified: Secondary | ICD-10-CM | POA: Diagnosis not present

## 2016-10-29 DIAGNOSIS — F32A Depression, unspecified: Secondary | ICD-10-CM | POA: Diagnosis present

## 2016-10-29 DIAGNOSIS — R072 Precordial pain: Secondary | ICD-10-CM | POA: Diagnosis not present

## 2016-10-29 DIAGNOSIS — Z85820 Personal history of malignant melanoma of skin: Secondary | ICD-10-CM | POA: Diagnosis not present

## 2016-10-29 DIAGNOSIS — I208 Other forms of angina pectoris: Secondary | ICD-10-CM

## 2016-10-29 DIAGNOSIS — N183 Chronic kidney disease, stage 3 unspecified: Secondary | ICD-10-CM | POA: Diagnosis present

## 2016-10-29 DIAGNOSIS — M353 Polymyalgia rheumatica: Secondary | ICD-10-CM | POA: Insufficient documentation

## 2016-10-29 DIAGNOSIS — I447 Left bundle-branch block, unspecified: Secondary | ICD-10-CM | POA: Diagnosis not present

## 2016-10-29 DIAGNOSIS — I2583 Coronary atherosclerosis due to lipid rich plaque: Secondary | ICD-10-CM | POA: Diagnosis not present

## 2016-10-29 DIAGNOSIS — R030 Elevated blood-pressure reading, without diagnosis of hypertension: Secondary | ICD-10-CM | POA: Diagnosis not present

## 2016-10-29 DIAGNOSIS — Z951 Presence of aortocoronary bypass graft: Secondary | ICD-10-CM | POA: Diagnosis not present

## 2016-10-29 DIAGNOSIS — I4729 Other ventricular tachycardia: Secondary | ICD-10-CM

## 2016-10-29 DIAGNOSIS — I5042 Chronic combined systolic (congestive) and diastolic (congestive) heart failure: Secondary | ICD-10-CM | POA: Diagnosis not present

## 2016-10-29 DIAGNOSIS — Z7982 Long term (current) use of aspirin: Secondary | ICD-10-CM | POA: Insufficient documentation

## 2016-10-29 DIAGNOSIS — I360 Nonrheumatic tricuspid (valve) stenosis: Secondary | ICD-10-CM | POA: Diagnosis not present

## 2016-10-29 DIAGNOSIS — M797 Fibromyalgia: Secondary | ICD-10-CM | POA: Insufficient documentation

## 2016-10-29 DIAGNOSIS — R0602 Shortness of breath: Secondary | ICD-10-CM | POA: Insufficient documentation

## 2016-10-29 DIAGNOSIS — R110 Nausea: Secondary | ICD-10-CM | POA: Insufficient documentation

## 2016-10-29 LAB — BASIC METABOLIC PANEL
Anion gap: 10 (ref 5–15)
BUN: 22 mg/dL — ABNORMAL HIGH (ref 6–20)
CO2: 21 mmol/L — ABNORMAL LOW (ref 22–32)
Calcium: 8.9 mg/dL (ref 8.9–10.3)
Chloride: 108 mmol/L (ref 101–111)
Creatinine, Ser: 1.38 mg/dL — ABNORMAL HIGH (ref 0.61–1.24)
GFR calc Af Amer: 51 mL/min — ABNORMAL LOW (ref >60)
GFR calc non Af Amer: 44 mL/min — ABNORMAL LOW (ref >60)
Glucose, Bld: 121 mg/dL — ABNORMAL HIGH (ref 65–99)
Potassium: 4 mmol/L (ref 3.5–5.1)
Sodium: 139 mmol/L (ref 135–145)

## 2016-10-29 LAB — ECHOCARDIOGRAM COMPLETE
Height: 72 in
Weight: 2368 oz

## 2016-10-29 LAB — BRAIN NATRIURETIC PEPTIDE: B Natriuretic Peptide: 226.8 pg/mL — ABNORMAL HIGH (ref 0.0–100.0)

## 2016-10-29 LAB — TROPONIN I: Troponin I: <0.03 ng/mL (ref <0.03)

## 2016-10-29 LAB — CBC
HCT: 36.1 % — ABNORMAL LOW (ref 39.0–52.0)
Hemoglobin: 11.5 g/dL — ABNORMAL LOW (ref 13.0–17.0)
MCH: 29.1 pg (ref 26.0–34.0)
MCHC: 31.9 g/dL (ref 30.0–36.0)
MCV: 91.4 fL (ref 78.0–100.0)
Platelets: 288 10*3/uL (ref 150–400)
RBC: 3.95 MIL/uL — ABNORMAL LOW (ref 4.22–5.81)
RDW: 13.4 % (ref 11.5–15.5)
WBC: 10 10*3/uL (ref 4.0–10.5)

## 2016-10-29 LAB — I-STAT TROPONIN, ED: Troponin i, poc: 0 ng/mL (ref 0.00–0.08)

## 2016-10-29 MED ORDER — ZOLPIDEM TARTRATE 5 MG PO TABS: 5.0000 mg | ORAL_TABLET | Freq: Every evening | ORAL | Status: DC | PRN

## 2016-10-29 MED ORDER — HYDRALAZINE HCL 20 MG/ML IJ SOLN: 5.0000 mg | INTRAMUSCULAR | Status: DC | PRN

## 2016-10-29 MED ORDER — NITROGLYCERIN 0.4 MG SL SUBL
0.4000 mg | SUBLINGUAL_TABLET | SUBLINGUAL | Status: DC | PRN
  Administered 2016-10-29: 0.4 mg via SUBLINGUAL
  Filled 2016-10-29: qty 1

## 2016-10-29 MED ORDER — EZETIMIBE 10 MG PO TABS
10.0000 mg | ORAL_TABLET | Freq: Every day | ORAL | Status: DC
  Administered 2016-10-29: 10 mg via ORAL
  Filled 2016-10-29: qty 1

## 2016-10-29 MED ORDER — ACETAMINOPHEN 325 MG PO TABS: 650.0000 mg | ORAL_TABLET | ORAL | Status: DC | PRN

## 2016-10-29 MED ORDER — ASPIRIN 325 MG PO TABS
325.0000 mg | ORAL_TABLET | Freq: Every day | ORAL | Status: DC
  Administered 2016-10-29: 325 mg via ORAL
  Filled 2016-10-29: qty 1

## 2016-10-29 MED ORDER — MORPHINE SULFATE (PF) 4 MG/ML IV SOLN: 4.0000 mg | Freq: Once | INTRAVENOUS | Status: DC

## 2016-10-29 MED ORDER — LEVOTHYROXINE SODIUM 125 MCG PO TABS
125.0000 ug | ORAL_TABLET | Freq: Every day | ORAL | Status: DC
  Administered 2016-10-29: 125 ug via ORAL
  Filled 2016-10-29: qty 1

## 2016-10-29 MED ORDER — MORPHINE SULFATE (PF) 4 MG/ML IV SOLN: 1.0000 mg | INTRAVENOUS | Status: DC | PRN

## 2016-10-29 MED ORDER — AMIODARONE HCL 100 MG PO TABS
100.0000 mg | ORAL_TABLET | ORAL | Status: DC
  Administered 2016-10-29: 100 mg via ORAL
  Filled 2016-10-29: qty 1

## 2016-10-29 MED ORDER — ONDANSETRON HCL 4 MG/2ML IJ SOLN
4.0000 mg | Freq: Once | INTRAMUSCULAR | Status: AC
  Administered 2016-10-29: 4 mg via INTRAVENOUS
  Filled 2016-10-29: qty 2

## 2016-10-29 MED ORDER — HYDROXYZINE HCL 10 MG PO TABS: 10.0000 mg | ORAL_TABLET | Freq: Three times a day (TID) | ORAL | Status: DC | PRN

## 2016-10-29 MED ORDER — MIRTAZAPINE 7.5 MG PO TABS: 7.5000 mg | ORAL_TABLET | Freq: Every day | ORAL | Status: DC

## 2016-10-29 MED ORDER — ENOXAPARIN SODIUM 30 MG/0.3ML ~~LOC~~ SOLN: 30.0000 mg | SUBCUTANEOUS | Status: DC

## 2016-10-29 NOTE — Consult Note (Signed)
CARDIOLOGY CONSULT NOTE   Patient ID: Gary Gonzalez MRN: [id number] DOB/AGE: 01-05-1927 83 y.o.  Admit date: 10/29/2016  Requesting Physician: Dr. Olevia Bowens Primary Physician:   [name], MD Primary Cardiologist:  Dr. Donneta Romberg Dr. [name] (EP) Reason for Consultation: chest pain  Gary Gonzalez is a 83 y.o. male who is being seen today for the evaluation of chest pain at the request of Dr. Olevia Bowens.   HPI: Gary Gonzalez is a 83 y.o. male with a history of PVCs on amiodarone, PVC induced cardiomyopathy, CAD s/p CABG x2V (2003), chronic disequilibrium, HTN, HLD, LBBB, hypothyroidism, gout, depression, CKD, anemia and OSA not on CPAP who presented to [facility] ED early this morning with chest pain.   He has a history of CAD s/p 2v CABG (seq LIMA to Diag and LAD, SVG to OM) on 05/15/2001. He is followed by Dr. [name] and was seen in 10/2014 for marked fatigue, weakness and SOB. He was noted to have significant increase in ventricular ectopy with couplets/triplets. Follow up Holter showed PVC burden of 62%. 2D ECHO showed EF decreased from 40-50%--> 25-30%. [facility] 11/19/2014 showed patent bypass grafts, normal heart pressures and LVEDP. His cardiomyopathy was felt to be related to PVCs and he was started on amiodarone by Dr. [name]. He has had a very good response to [facility] for PVC suppression and EF returned to 45-50% in 03/2015. Later amio was stopped due to intolerance ( fatigue and weakness) but restarted and dose was decreased. He was previously on repatha but this was discontinued to see if it helped his symptoms of weakness/fatigue. He has also been intolerant to BBs.  He was recently seen by Dr. [name] on 10/19/16. Holter monitor a few weeks prior showed 50 PVCs in 24 hours. His amio was decreased from 100mg  daily to 100mg  daily M-Friday.   He was in his usual state of health until last night when he was woken up from sleep with chest pain. It was a pressure rate 5/10 associated with SOB. No nausea or  diaphoresis. He also had some dizziness. This is the first time he has ever had chest pain and it concerned him. EMS was called. By the time emergency personal arrived the pain was easing off. He was given 1 SL NTG en route and one in the ER. He is now completely chest pain free. He also had weakness in his legs. No LE edema, orthopnea or PND. No syncope but did have some dizziness today.   ED Course: pt was found to have negative troponin, WBC 10.0, stable renal function, temperature normal, bradycardia, oxygen saturation 100% on room air, chest x-ray of the left basilar atelectasis. Blood pressure is elevated at 180/70-->153/60.   Currently feeling fine and wondering if he can go home.    Past Medical History:  Diagnosis Date  . Anemia   . Arthritis    "touch in the fingers" (11/17/2014)  . Chronic combined systolic and diastolic CHF (congestive heart failure) ([place])    a. 11/17/14 showed EF 25-30%, suboptimal image quality, diffuse hypokinesis worse in the inferior wall, mild to moderate MR. New drop EF compared to 2012 but cath was stable.  . CKD (chronic kidney disease), stage III   . Coronary artery disease    a. s/p 2v CABG (seq LIMA to Diag and LAD, SVG to OM) on 05/15/2001.  . Fibromyalgia    "a long time ago" (11/17/2014)  . History of gout   . Hyperlipidemia   .  Hypothyroidism   . LBBB (left bundle branch block)   . Melanoma of back ([place])   . NSVT (nonsustained ventricular tachycardia) ([place])   . PMR (polymyalgia rheumatica) (HCC)   . PVC's (premature ventricular contractions)   . Sleep apnea    "wife says I do" (11/18/2014)     Past Surgical History:  Procedure Laterality Date  . AXILLARY LYMPH NODE DISSECTION Bilateral 2001  . CARDIAC CATHETERIZATION  04/28/2001  . CARDIAC CATHETERIZATION N/A 11/19/2014   Procedure: Right/Left Heart Cath and Coronary/Graft Angiography;  Surgeon: Jettie Booze, MD;  Location: [place] CV [facility];  Service: Cardiovascular;  Laterality:  N/A;  . CATARACT EXTRACTION W/ INTRAOCULAR LENS  IMPLANT, BILATERAL Bilateral ~ 2010  . CORONARY ARTERY BYPASS GRAFT  05/15/2001   x3 sequential LIMA to diag and LAD, SVG to OM   . MELANOMA EXCISION  2001   off of back    Allergies  Allergen Reactions  . Atenolol Diarrhea and Other (See Comments)    Very weak, fatigued, lethargic, no appetite    I have reviewed the patient's current medications . amiodarone  100 mg Oral Once per day on Mon Tue Wed Thu Fri  . aspirin  325 mg Oral Daily  . enoxaparin (LOVENOX) injection  30 mg Subcutaneous Q24H  . ezetimibe  10 mg Oral Daily  . levothyroxine  125 mcg Oral QAC breakfast  . mirtazapine  7.5 mg Oral QHS    acetaminophen, hydrALAZINE, hydrOXYzine, morphine injection, nitroGLYCERIN, zolpidem  Prior to Admission medications   Medication Sig Start Date End Date Taking? Authorizing Provider  amiodarone (PACERONE) 200 MG tablet Amiodarone to 1/2 tablet (100 mg) daily Monday - Friday. Do not take on Saturday and Sunday. 10/19/16  Yes Evans Lance, MD  aspirin 81 MG tablet Take 81 mg by mouth daily.   Yes [provider]  ezetimibe (ZETIA) 10 MG tablet Take 10 mg by mouth daily.   Yes [provider]  levothyroxine (SYNTHROID, LEVOTHROID) 125 MCG tablet Take 1 tablet by mouth daily before breakfast. 04/13/16  Yes [provider]  mirtazapine (REMERON) 7.5 MG tablet Take 7.5 mg by mouth at bedtime.   Yes [provider]     Social History   Social History  . Marital status: Married    Spouse name: N/A  . Number of children: N/A  . Years of education: N/A   Occupational History  . Not on file.   Social History Main Topics  . Smoking status: Never Smoker  . Smokeless tobacco: Never Used  . Alcohol use 3.6 oz/week    3 Glasses of wine, 3 Cans of beer per week  . Drug use: No  . Sexual activity: No   Other Topics Concern  . Not on file   Social History Narrative  . No narrative on file      Family Status  Relation Status  . Father Deceased at age 29       age  . Mother Deceased at age 71       liver cancer  . Sister Alive  . Sister Alive  . Brother Alive  . MGM Deceased  . MGF Deceased  . PGM Deceased  . PGF Deceased  . Son (Not Specified)  . Daughter (Not Specified)   Family History  Problem Relation Age of Onset  . Prostate cancer Father   . Liver cancer Mother   . Hyperlipidemia Son   . Hyperlipidemia Daughter  ROS:  Full 14 point review of systems complete and found to be negative unless listed above.  Physical Exam: Blood pressure (!) 159/52, pulse (!) 56, temperature 97.4 F (36.3 C), temperature source Oral, resp. rate 18, height 6' (1.829 m), weight 148 lb (67.1 kg), SpO2 100 %.  General: Well developed, well nourished, male in no acute distress, appears younger than his stated age.  Head: Eyes PERRLA, No xanthomas.   Normocephalic and atraumatic, oropharynx without edema or exudate.  Lungs: CTAB Heart: HRRR S1 S2, no rub/gallop, Heart regular rate and rhythm with S1, S2 no murmur. Decreased breath sounds. pulses are 2+ extrem.   Neck: No carotid bruits. No lymphadenopathy.  No JVD. Abdomen: Bowel sounds present, abdomen soft and non-tender without masses or hernias noted. Msk:  No spine or cva tenderness. No weakness, no joint deformities or effusions. Extremities: No clubbing or cyanosis. No LE edema.  Neuro: Alert and oriented X 3. No focal deficits noted. Psych:  Good affect, responds appropriately Skin: No rashes or lesions noted.  Labs:   Lab Results  Component Value Date   WBC 10.0 10/29/2016   HGB 11.5 (L) 10/29/2016   HCT 36.1 (L) 10/29/2016   MCV 91.4 10/29/2016   PLT 288 10/29/2016   No results for input(s): INR in the last 72 hours.  Recent Labs Lab 10/29/16 0343  NA 139  K 4.0  CL 108  CO2 21*  BUN 22*  CREATININE 1.38*  CALCIUM 8.9  GLUCOSE 121*   Magnesium  Date Value Ref Range Status  11/11/2014 2.2 1.5 - 2.5  mg/dL Final    Recent Labs  10/29/16 1115  TROPONINI <0.03    Recent Labs  10/29/16 0355  TROPIPOC 0.00   Pro B Natriuretic peptide (BNP)  Date/Time Value Ref Range Status  12/03/2014 12:27 PM 361.0 (H) 0.0 - 100.0 pg/mL Final   No results found for: CHOL, HDL, LDLCALC, TRIG No results found for: DDIMER No results found for: LIPASE, AMYLASE TSH  Date/Time Value Ref Range Status  11/11/2014 09:44 AM 4.887 (H) 0.350 - 4.500 uIU/mL Final   No results found for: VITAMINB12, FOLATE, FERRITIN, TIBC, IRON, RETICCTPCT  Echo: 04/21/2015 LV EF: 45% -   50% Study Conclusions - Left ventricle: The cavity size was normal. Wall thickness was   normal. Systolic function was mildly reduced. The estimated   ejection fraction was in the range of 45% to 50%. Mild diffuse   hypokinesis with no identifiable regional variations. Doppler   parameters are consistent with abnormal left ventricular   relaxation (grade 1 diastolic dysfunction). - Ventricular septum: Septal motion showed paradox. These changes   are consistent with a left bundle branch block. - Mitral valve: There was mild regurgitation.  11/19/14 Right/Left Heart Cath and Coronary/Graft Angiography  Conclusion  Patent grafts, LIMA to Diagonal and LAD; SVG to OM. Patent RCA.  Normal Right heart pressures. Normal LVEDP. Continue with EP evaluation for frequent PVCs.  Cardiology f/u with Dr. [name].     ECG: sinus brady with LBBB HR 56  - personally reviewed  TELE: sinus  - personally reviewed  Radiology:  Dg Chest 2 View  Result Date: 10/29/2016 CLINICAL DATA:  Acute onset of generalized chest pain and shortness of breath. Initial encounter. EXAM: CHEST  2 VIEW COMPARISON:  None. FINDINGS: The lungs are well-aerated. Minimal left basilar opacity likely reflects atelectasis. There is no evidence of pleural effusion or pneumothorax. The heart is normal in size; the patient is status  post median sternotomy. No acute osseous  abnormalities are seen. Clips are noted at both axilla. IMPRESSION: Minimal left basilar opacity likely reflects atelectasis. Lungs otherwise clear. Electronically Signed   By: Garald Balding M.D.   On: 10/29/2016 04:34    ASSESSMENT AND PLAN:    Principal Problem:   Chest pain Active Problems:   CAD (coronary artery disease)   Hyperlipidemia   NSVT (nonsustained ventricular tachycardia) (HCC)   S/P CABG x 2   CKD (chronic kidney disease), stage III   Chronic combined systolic and diastolic CHF (congestive heart failure) (HCC)   Hypothyroidism (acquired)   Depression   Elevated blood pressure reading  Gary Gonzalez is a 83 y.o. male with a history of PVCs on amiodarone, PVC induced cardiomyopathy, CAD s/p CABG x2V (2003), chronic disequilibrium, HTN, HLD, LBBB, hypothyroidism, gout, depression, CKD, anemia and OSA not on CPAP who presented to [facility] ED early this morning with chest pain.   Chest pain: he has ruled out for MI with negative cardiac enzymes and non acute ECG. Repeat 2D ECHO pending but informal read by Dr. Stanford Breed shows EF similar to previous. Plan will be to discharge home with outpatient stress test.   CAD s/p CABG: continue ASA. Intolerant to BBs and statins. Continue Zetia.   PVCs: well controlled on amiodarone followed by Dr. [name]   History of cardiomyopathy: felt to be 2/2 to PVCs. EF improved 25-30%--> 45-50% with PVC suppression with amiodarone. BNP mildly elevated but appears euvolemic.   HLD: previously on Repatha but this was discontinued when he was having sx of weakness. Now only on Zetia.  Signed: Angelena Form, PA-C 10/29/2016 12:10 PM  Pager [phone]  Co-Sign MD As above, patient seen and examined. Briefly he is an 83 year old male with past medical history of coronary artery disease status post coronary artery bypass graft, PVC induced cardiomyopathy improved, hypertension, hyperlipidemia, chronic stage III kidney disease for evaluation of chest  pain. Patient does have some complaints of chronic fatigue but typically does not have significant dyspnea on exertion, orthopnea, PND, pedal edema or exertional chest pain. At approximately 1:30 AM today he awoke with substernal chest pain described as pressure. The pain was not pleuritic or positional. No radiation. He had mild nausea but no dyspnea or diaphoresis. His pain was continuous until approximately 10:30 AM today and resolve spontaneously. Total duration of pain 9 hours. Troponin at 3:55 0.00 and at 11:15 less than 0.03. Electrocardiogram shows sinus rhythm with PAC and PVC. Cannot rule out prior septal infarct. Nonspecific ST changes.   1 chest pain-symptoms are atypical. Duration of 9 hours with negative enzymes and no diagnostic electrocardiographic changes. Pain is now resolved. I have reviewed his echocardiogram and his ejection fraction appears to be in the 40-45% range. Patient can be discharged with outpatient nuclear study for risk stratification and then follow-up with Dr. [name].  2 coronary artery disease-continue aspirin. Intolerant to statins. Continue Zetia.  3 history of cardiomyopathy-felt secondary to PVCs. Ejection fraction may be mildly decreased compared to previous but not markedly different. Continue amiodarone. Patient apparently has been intolerant to beta blockade and ACE inhibitor's previously because of dizziness. This can be reattempted as an outpatient but will leave to Dr. [name].  Kirk Ruths, MD

## 2016-10-29 NOTE — Progress Notes (Signed)
Pt has orders to be discharged. Discharge instructions given and pt has no additional questions at this time. Pt understands that he will have a stress test on 7/3.  Medication regimen reviewed and pt educated. Pt verbalized understanding and has no additional questions. Telemetry box removed. IV removed and site in good condition. Pt stable and daughter present to drive him home.

## 2016-10-29 NOTE — ED Provider Notes (Signed)
Patient seen/examined in the Emergency Department in conjunction with Midlevel Provider Rona Ravens Patient reports chest pressure and shortness of breath  He had some relief with NTG Exam : awake/alert, uncomfortable appearing, no murmurs, distal pulses equal/intact, no focal weakness Plan: plan to admit for ACS evaluation     Ripley Fraise, MD 10/29/16 [phone]

## 2016-10-29 NOTE — ED Provider Notes (Signed)
[place] DEPT Provider Note   CSN: [id number] Arrival date & time: 10/29/16  0335     History   Chief Complaint Chief Complaint  Patient presents with  . Chest Pain    HPI Gary Gonzalez is a 83 y.o. male.  HPI   83 year old male with history of CAD status post CABG, fibromyalgia, CHF, left bundle branch block present to the ER via EMS for evaluation of chest pain.Patient report he was awoke this morning with central chest discomfort that has been ongoing for the past 2-1/2 hours. He described a tightness sensation with associated lightheadedness, feeling weak, nauseous, with associate shortness of breath. When EMS arrive patient received 324 mg of aspirin and 1 sublingual nitroglycerin but it did help with his chest discomfort but patient still endorsed feeling lightheadedness. He denies having fever, URI symptoms, headache, productive cough, hemoptysis, abdominal pain, dysuria, focal numbness or focal weakness. He denies having similar symptoms like this before. He mentioned his last cardiac stress test was several years ago. Does have history of PVCs which he is currently taking amiodarone. He was taken it daily but his doctor recently changes in which he does not take it on Saturday and Sunday. He mentioned this sensation felt different from his PVC.  Past Medical History:  Diagnosis Date  . Anemia   . Arthritis    "touch in the fingers" (11/17/2014)  . Chronic combined systolic and diastolic CHF (congestive heart failure) ([place])    a. 11/17/14 showed EF 25-30%, suboptimal image quality, diffuse hypokinesis worse in the inferior wall, mild to moderate MR. New drop EF compared to 2012 but cath was stable.  . CKD (chronic kidney disease), stage III   . Coronary artery disease    a. s/p 2v CABG (seq LIMA to Diag and LAD, SVG to OM) on 05/15/2001.  . Fibromyalgia    "a long time ago" (11/17/2014)  . History of gout   . Hyperlipidemia   . Hypothyroidism   . LBBB (left bundle branch  block)   . Melanoma of back ([place])   . NSVT (nonsustained ventricular tachycardia) ([place])   . PMR (polymyalgia rheumatica) (HCC)   . PVC's (premature ventricular contractions)   . Sleep apnea    "wife says I do" (11/18/2014)    Patient Active Problem List   Diagnosis Date Noted  . Chronic systolic heart failure ([place])   . S/P CABG x 2   . Chronic kidney disease (CKD)   . Fatigue 11/11/2014  . NSVT (nonsustained ventricular tachycardia) ([place]) 11/11/2014  . LV dysfunction 07/23/2014  . Hyperlipidemia 07/27/2013  . Dizzy spells 10/24/2011  . CAD (coronary artery disease) 03/30/2011  . PVC's (premature ventricular contractions) 03/30/2011  . ANEMIA, IRON DEFICIENCY 03/12/2008  . GERD 03/12/2008    Past Surgical History:  Procedure Laterality Date  . AXILLARY LYMPH NODE DISSECTION Bilateral 2001  . CARDIAC CATHETERIZATION  04/28/2001  . CARDIAC CATHETERIZATION N/A 11/19/2014   Procedure: Right/Left Heart Cath and Coronary/Graft Angiography;  Surgeon: Jettie Booze, MD;  Location: [place] [facility];  Service: Cardiovascular;  Laterality: N/A;  . CATARACT EXTRACTION W/ INTRAOCULAR LENS  IMPLANT, BILATERAL Bilateral ~ 2010  . CORONARY ARTERY BYPASS GRAFT  05/15/2001   x3 sequential LIMA to diag and LAD, SVG to OM   . MELANOMA EXCISION  2001   off of back       Home Medications    Prior to Admission medications   Medication Sig Start Date End Date Taking? Authorizing  Provider  amiodarone (PACERONE) 200 MG tablet Amiodarone to 1/2 tablet (100 mg) daily Monday - Friday. Do not take on Saturday and Sunday. 10/19/16   Evans Lance, MD  aspirin 81 MG tablet Take 81 mg by mouth daily.    [provider]  ezetimibe (ZETIA) 10 MG tablet Take 10 mg by mouth daily.    [provider]  levothyroxine (SYNTHROID, LEVOTHROID) 125 MCG tablet Take 1 tablet by mouth daily before breakfast. 04/13/16   [provider]  mirtazapine (REMERON) 7.5 MG tablet Take 7.5  mg by mouth at bedtime.    [provider]    Family History Family History  Problem Relation Age of Onset  . Prostate cancer Father   . Liver cancer Mother   . Hyperlipidemia Son   . Hyperlipidemia Daughter     Social History Social History  Substance Use Topics  . Smoking status: Never Smoker  . Smokeless tobacco: Never Used  . Alcohol use 3.6 oz/week    3 Glasses of wine, 3 Cans of beer per week     Allergies   Atenolol   Review of Systems Review of Systems  All other systems reviewed and are negative.    Physical Exam Updated Vital Signs BP (!) 180/70 (BP Location: Right Arm)   Pulse 66   Temp 97.4 F (36.3 C) (Oral)   Resp 18   Ht 6' (1.829 m)   Wt 63.5 kg (140 lb)   SpO2 100%   BMI 18.99 kg/m   Physical Exam  Constitutional: He is oriented to person, place, and time. He appears well-developed and well-nourished. No distress.  Elderly male appears to be in mild respiratory discomfort but nontoxic in appearance  HENT:  Head: Atraumatic.  Mouth/Throat: Oropharynx is clear and moist.  Eyes: Conjunctivae and EOM are normal. Pupils are equal, round, and reactive to light.  Neck: Normal range of motion. Neck supple. No JVD present.  Cardiovascular: Normal rate, regular rhythm and intact distal pulses.   Pulmonary/Chest:  Mildly tachypneic, no wheezes, rales, or rhonchi  Abdominal: Soft. He exhibits no distension. There is no tenderness.  Musculoskeletal: Normal range of motion. He exhibits no edema.  Neurological: He is alert and oriented to person, place, and time.  Skin: No rash noted.  Psychiatric: He has a normal mood and affect.  Nursing note and vitals reviewed.    ED Treatments / Results  Labs (all labs ordered are listed, but only abnormal results are displayed) Labs Reviewed  BASIC METABOLIC PANEL - Abnormal; Notable for the following:       Result Value   CO2 21 (*)    Glucose, Bld 121 (*)    BUN 22 (*)    Creatinine, Ser  1.38 (*)    GFR calc non Af Amer 44 (*)    GFR calc Af Amer 51 (*)    All other components within normal limits  CBC - Abnormal; Notable for the following:    RBC 3.95 (*)    Hemoglobin 11.5 (*)    HCT 36.1 (*)    All other components within normal limits  I-STAT TROPOININ, ED    EKG  EKG Interpretation  Date/Time:  Monday October 29 2016 03:39:35 EDT Ventricular Rate:  56 PR Interval:    QRS Duration: 136 QT Interval:  523 QTC Calculation: 505 R Axis:   -32 Text Interpretation:  Sinus rhythm Multiple premature complexes, vent & supraven Left bundle branch block No significant change since  last tracing Confirmed by Ripley Fraise [phone]) on 10/29/2016 3:43:18 AM       Radiology Dg Chest 2 View  Result Date: 10/29/2016 CLINICAL DATA:  Acute onset of generalized chest pain and shortness of breath. Initial encounter. EXAM: CHEST  2 VIEW COMPARISON:  None. FINDINGS: The lungs are well-aerated. Minimal left basilar opacity likely reflects atelectasis. There is no evidence of pleural effusion or pneumothorax. The heart is normal in size; the patient is status post median sternotomy. No acute osseous abnormalities are seen. Clips are noted at both axilla. IMPRESSION: Minimal left basilar opacity likely reflects atelectasis. Lungs otherwise clear. Electronically Signed   By: Garald Balding M.D.   On: 10/29/2016 04:34    Procedures Procedures (including critical care time)  Medications Ordered in ED Medications  nitroGLYCERIN (NITROSTAT) SL tablet 0.4 mg (0.4 mg Sublingual Given 10/29/16 0429)  ondansetron (ZOFRAN) injection 4 mg (4 mg Intravenous Given 10/29/16 0429)     Initial Impression / Assessment and Plan / ED Course  I have reviewed the triage vital signs and the nursing notes.  Pertinent labs & imaging results that were available during my care of the patient were reviewed by me and considered in my medical decision making (see chart for details).     BP (!) 153/60   Pulse  63   Temp 97.4 F (36.3 C) (Oral)   Resp (!) 25   Ht 6' (1.829 m)   Wt 63.5 kg (140 lb)   SpO2 100%   BMI 18.99 kg/m    Final Clinical Impressions(s) / ED Diagnoses   Final diagnoses:  Chronic combined systolic and diastolic CHF (congestive heart failure) (HCC)  Stable angina pectoris (HCC)    New Prescriptions New Prescriptions   No medications on file   3:59 AM Patient here with central chest discomfort which has been ongoing for the past 2 and half hours. Symptoms concerning for ACS. Workup initiated, will provide symptomatic treatment.  Care discussed with Dr. Christy Gentles.   After receiving SL nitro and ASA his pain is mostly resolved.  Pt appears much more comfortable.  He does have elevated BP, which may explain his pain.  Likely preload stress.  EKG without concerning changes, CXR unremarkable, and initial troponin is normal.   5:13 AM Appreciate consultation from [facility] Dr. Blaine Hamper who agrees to see and admit pt for chest pain rule out.     Domenic Moras, PA-C 10/29/16 0515    Ripley Fraise, MD 10/29/16 3532    Ripley Fraise, MD 10/29/16 [phone]

## 2016-10-29 NOTE — ED Notes (Signed)
Patient transported to X-ray 

## 2016-10-29 NOTE — Discharge Instructions (Signed)
° °  You have a Stress Test scheduled at [facility]. Your doctor has ordered this test to check the blood flow in your heart arteries. ° °Please arrive 15 minutes early for paperwork. The whole test will take several hours. You may want to bring reading material to remain occupied while undergoing different parts of the test. ° °Instructions: °· No food/drink after midnight the night before. °· It is OK to take your morning meds with a sip of water EXCEPT for those types of medicines listed below or otherwise instructed. °· No caffeine/decaf products 24 hours before, including medicines such as Excedrin or Goody Powders. Call if there are any questions.  °· Wear comfortable clothes and shoes.  ° °Special Medication Instructions: °· Beta blockers such as metoprolol (Lopressor/Toprol XL), atenolol (Tenormin), carvedilol (Coreg), nebivolol (Bystolic), bisoprolol (Zebeta), propranolol (Inderal) should not be taken for 24 hours before the test. °· Calcium channel blockers such as diltiazem (Cardizem) or verapmil (Calan) should not be taken for 24 hours before the test. °· Remove nitroglycerin patches and do not take nitrate preparations such as Imdur/isosorbide the day of your test. °· No Persantine/Theophylline or Aggrenox medicines should be used within 24 hours of the test. ° °What To Expect: °When you arrive in the lab, the technician will inject a small amount of radioactive tracer into your arm through an IV while you are resting quietly. This helps us to form pictures of your heart. You will likely only feel a sting from the IV. After a waiting period, resting pictures will be obtained under a big camera. These are the "before" pictures. ° °Next, you will be prepped for the stress portion of the test. This may include either walking on a treadmill or receiving a medicine that helps to dilate blood vessels in your heart to simulate the effect of exercise on your heart. If you are walking on  a treadmill, you will walk at different paces to try to get your heart rate to a goal number that is based on your age. If your doctor has chosen the pharmacologic test, then you will receive a medicine through your IV that may cause temporary nausea, flushing, shortness of breath and sometimes chest discomfort or vomiting. This is typically short-lived and usually resolves quickly. If you experience symptoms, that does not automatically mean the test is abnormal. Some patients do not experience any symptoms at all. Your blood pressure and heart rate will be monitored, and we will be watching your EKG on a computer screen for any changes. During this portion of the test, the radiologist will inject another small amount of radioactive tracer into your IV. After a waiting period, you will undergo a second set of pictures. These are the "after" pictures. ° °The doctor reading the test will compare the before-and-after images to look for evidence of heart blockages or heart weakness. The test usually takes 1 day to complete, but in certain instances (for example, if a patient is over a certain weight limit), the test may be done over the span of 2 days. ° ° °

## 2016-10-29 NOTE — H&P (Signed)
History and Physical    Gary Gonzalez EGB:[id number] DOB: 08/29/1926 DOA: 10/29/2016  Referring MD/NP/PA:   PCP: [name], MD   Patient coming from:  The patient is coming from home.  At baseline, pt is independent for most of ADL.   Chief Complaint: Chest pain  HPI: Gary Gonzalez is a 83 y.o. male with medical history significant of CAD, s/p of CABG 2003, hyperlipidemia, hypothyroidism, gout, depression, PMR, NSVT, CKD-3, chronic combined systolic and diastolic CHF with EF 37-10%, anemia, hypothyroidism, OSA not on CPAP, who presents with chest pain.  Patient's chest pain started last night. It is located in the substernal area, 5 out of 10 in severity, dull, nonradiating. It is associated with nausea, no vomiting, diarrhea or abdominal pain. Patient also had mild SOB which has resolved currently. No cough, fever or chills. Denies tenderness in the calf areas. Patient felt dizzy, but no unilateral weakness, numbness or tingling sensations in extremities. No facial droop, slurred speech. No symptoms of UTI  ED Course: pt was found to have negative troponin, WBC 10.0, stable renal function, temperature normal, bradycardia, oxygen saturation 100% on room air, chest x-ray of the left basilar atelectasis. Blood pressure is elevated at 180/70-->153/60. Pt is placed on tele bed for obs. Given 324 ASA and 1TNG by EMS en route.  Review of Systems:   General: no fevers, chills, no changes in body weight, has poor appetite, has fatigue HEENT: no blurry vision, hearing changes or sore throat Respiratory: has dyspnea, no coughing, wheezing CV: has chest pain, no palpitations GI: has nausea, no omiting, abdominal pain, diarrhea, constipation GU: no dysuria, burning on urination, increased urinary frequency, hematuria  Ext: no leg edema Neuro: no unilateral weakness, numbness, or tingling, no vision change or hearing loss Skin: no rash, no skin tear. MSK: No muscle spasm, no deformity, no limitation of  range of movement in spin Heme: No easy bruising.  Travel history: No recent long distant travel.  Allergy:  Allergies  Allergen Reactions  . Atenolol Diarrhea and Other (See Comments)    Very weak, fatigued, lethargic, no appetite    Past Medical History:  Diagnosis Date  . Anemia   . Arthritis    "touch in the fingers" (11/17/2014)  . Chronic combined systolic and diastolic CHF (congestive heart failure) ([place])    a. 11/17/14 showed EF 25-30%, suboptimal image quality, diffuse hypokinesis worse in the inferior wall, mild to moderate MR. New drop EF compared to 2012 but cath was stable.  . CKD (chronic kidney disease), stage III   . Coronary artery disease    a. s/p 2v CABG (seq LIMA to Diag and LAD, SVG to OM) on 05/15/2001.  . Fibromyalgia    "a long time ago" (11/17/2014)  . History of gout   . Hyperlipidemia   . Hypothyroidism   . LBBB (left bundle branch block)   . Melanoma of back ([place])   . NSVT (nonsustained ventricular tachycardia) ([place])   . PMR (polymyalgia rheumatica) ([facility])   . PVC's (premature ventricular contractions)   . Sleep apnea    "wife says I do" (11/18/2014)    Past Surgical History:  Procedure Laterality Date  . AXILLARY LYMPH NODE DISSECTION Bilateral 2001  . CARDIAC CATHETERIZATION  04/28/2001  . CARDIAC CATHETERIZATION N/A 11/19/2014   Procedure: Right/Left Heart Cath and Coronary/Graft Angiography;  Surgeon: Jettie Booze, MD;  Location: [facility];  Service: Cardiovascular;  Laterality: N/A;  . CATARACT EXTRACTION W/ INTRAOCULAR LENS  IMPLANT, BILATERAL Bilateral ~ 2010  . CORONARY ARTERY BYPASS GRAFT  05/15/2001   x3 sequential LIMA to diag and LAD, SVG to OM   . MELANOMA EXCISION  2001   off of back    Social History:  reports that he has never smoked. He has never used smokeless tobacco. He reports that he drinks about 3.6 oz of alcohol per week . He reports that he does not use drugs.  Family History:  Family History  Problem  Relation Age of Onset  . Prostate cancer Father   . Liver cancer Mother   . Hyperlipidemia Son   . Hyperlipidemia Daughter      Prior to Admission medications   Medication Sig Start Date End Date Taking? Authorizing Provider  amiodarone (PACERONE) 200 MG tablet Amiodarone to 1/2 tablet (100 mg) daily Monday - Friday. Do not take on Saturday and Sunday. 10/19/16  Yes Evans Lance, MD  aspirin 81 MG tablet Take 81 mg by mouth daily.   Yes [provider]  ezetimibe (ZETIA) 10 MG tablet Take 10 mg by mouth daily.   Yes [provider]  levothyroxine (SYNTHROID, LEVOTHROID) 125 MCG tablet Take 1 tablet by mouth daily before breakfast. 04/13/16  Yes [provider]  mirtazapine (REMERON) 7.5 MG tablet Take 7.5 mg by mouth at bedtime.   Yes [provider]    Physical Exam: Vitals:   10/29/16 0400 10/29/16 0430 10/29/16 0501 10/29/16 0530  BP: (!) 170/63 (!) 153/60 (!) 159/65 (!) 159/72  Pulse: (!) 31 63 (!) 59 (!) 58  Resp: (!) 22 (!) 25 (!) 21 20  Temp:      TempSrc:      SpO2: 100% 100% 100% 100%  Weight:      Height:       General: Not in acute distress HEENT:       Eyes: PERRL, EOMI, no scleral icterus.       ENT: No discharge from the ears and nose, no pharynx injection, no tonsillar enlargement.        Neck: No JVD, no bruit, no mass felt. Heme: No neck lymph node enlargement. Cardiac: S1/S2, RRR, No murmurs, No gallops or rubs. Respiratory: No rales, wheezing, rhonchi or rubs. GI: Soft, nondistended, nontender, no rebound pain, no organomegaly, BS present. GU: No hematuria Ext: No pitting leg edema bilaterally. 2+DP/PT pulse bilaterally. Musculoskeletal: No joint deformities, No joint redness or warmth, no limitation of ROM in spin. Skin: No rashes.  Neuro: Alert, oriented X3, cranial nerves II-XII grossly intact, moves all extremities normally.  Psych: Patient is not psychotic, no suicidal or hemocidal ideation.  Labs on Admission:  I have personally reviewed following labs and imaging studies  CBC:  Recent Labs Lab 10/29/16 0343  WBC 10.0  HGB 11.5*  HCT 36.1*  MCV 91.4  PLT 702   Basic Metabolic Panel:  Recent Labs Lab 10/29/16 0343  NA 139  K 4.0  CL 108  CO2 21*  GLUCOSE 121*  BUN 22*  CREATININE 1.38*  CALCIUM 8.9   GFR: Estimated Creatinine Clearance: 33.2 mL/min (A) (by C-G formula based on SCr of 1.38 mg/dL (H)). Liver Function Tests: No results for input(s): AST, ALT, ALKPHOS, BILITOT, PROT, ALBUMIN in the last 168 hours. No results for input(s): LIPASE, AMYLASE in the last 168 hours. No results for input(s): AMMONIA in the last 168 hours. Coagulation Profile: No results for input(s): INR, PROTIME in the last 168 hours. Cardiac Enzymes: No results for  input(s): CKTOTAL, CKMB, CKMBINDEX, TROPONINI in the last 168 hours. BNP (last 3 results) No results for input(s): PROBNP in the last 8760 hours. HbA1C: No results for input(s): HGBA1C in the last 72 hours. CBG: No results for input(s): GLUCAP in the last 168 hours. Lipid Profile: No results for input(s): CHOL, HDL, LDLCALC, TRIG, CHOLHDL, LDLDIRECT in the last 72 hours. Thyroid Function Tests: No results for input(s): TSH, T4TOTAL, FREET4, T3FREE, THYROIDAB in the last 72 hours. Anemia Panel: No results for input(s): VITAMINB12, FOLATE, FERRITIN, TIBC, IRON, RETICCTPCT in the last 72 hours. Urine analysis: No results found for: COLORURINE, APPEARANCEUR, LABSPEC, PHURINE, GLUCOSEU, HGBUR, BILIRUBINUR, KETONESUR, PROTEINUR, UROBILINOGEN, NITRITE, LEUKOCYTESUR Sepsis Labs: @LABRCNTIP (procalcitonin:4,lacticidven:4) )No results found for this or any previous visit (from the past 240 hour(s)).   Radiological Exams on Admission: Dg Chest 2 View  Result Date: 10/29/2016 CLINICAL DATA:  Acute onset of generalized chest pain and shortness of breath. Initial encounter. EXAM: CHEST  2 VIEW COMPARISON:  None. FINDINGS: The lungs are  well-aerated. Minimal left basilar opacity likely reflects atelectasis. There is no evidence of pleural effusion or pneumothorax. The heart is normal in size; the patient is status post median sternotomy. No acute osseous abnormalities are seen. Clips are noted at both axilla. IMPRESSION: Minimal left basilar opacity likely reflects atelectasis. Lungs otherwise clear. Electronically Signed   By: Garald Balding M.D.   On: 10/29/2016 04:34     EKG: Independently reviewed.  Sinus rhythm, QTC 505, left bundle blockage, PAC, PVC   Assessment/Plan Principal Problem:   Chest pain Active Problems:   CAD (coronary artery disease)   Hyperlipidemia   NSVT (nonsustained ventricular tachycardia) ([facility])   S/P CABG x 2   CKD (chronic kidney disease), stage III   Chronic combined systolic and diastolic CHF (congestive heart failure) ([facility])   Hypothyroidism (acquired)   Depression   Elevated blood pressure reading   Chest pain and hx of CAD: Patient does not have fever or leukocytosis, less likely to have pneumonia. Patient does not signs of DVT, less likely to have PE. Given a history of a CAD and s/p of CABG, it is most likely due to ischemia. Pt is at risk of developing ACS. Initial troponin negative. Patient has baseline abnormal EKG, but no significant change. Now patient's chest pain subsided. Will have low threshold to start IV heparin if chest get worse or trop becomes positive.  - will place on Tele bed for obs - cycle CE q6 x3 and repeat EKG in the am  - prn Nitroglycerin, Morphine, and aspirin, Zetia - Risk factor stratification: will check FLP and A1C  - 2d echo - please call Card in AM (his cardiologist is Dr. [name])  HLD: -continue home Zetia  NSVT (nonsustained ventricular tachycardia) ([place]): -continue home amiodarone  CKD (chronic kidney disease), stage III: stable. Baseline creatinine 1.4-1.6. His creatinine is 1.38, BUN 22. -Follow up renal function by BMP  Chronic combined  systolic and diastolic CHF: Patient does not have leg edema JVD. CHF is compensated. Patient is not on diuretics. -Check BNP  Hypothyroidism: Last TSH was 4.887 on 11/11/14 -Continue home Synthroid  Depression: Stable, no suicidal or homicidal ideations. -Continue home medications: Remeron  Elevated blood pressure reading: 180/70-->153/60. Patient does not have history of hypertension. He may have undiagnosed hypertension or elevated blood pressure is due to pain. -IV hydralazine when necessary -If patient has persistent elevated blood pressure, may need to add oral medications.  DVT ppx: SQ Lovenox Code Status: Full  code Family Communication: None at bed side.  Disposition Plan:  Anticipate discharge back to previous home environment Consults called:  none Admission status: Obs / tele    Date of Service 10/29/2016    Ivor Costa [facility] Pager [phone]  If 7PM-7AM, please contact night-coverage [email or web address] Password [email or web address] 10/29/2016, 5:51 AM

## 2016-10-29 NOTE — Discharge Summary (Signed)
Physician Discharge Summary  Gary Gonzalez:[id number] DOB: [birth year] DOA: 10/29/2016  PCP: [name], MD  Admit date: 10/29/2016 Discharge date: 10/29/2016  Admitted From: Home Disposition:  Home  Recommendations for Outpatient Follow-up:  1. Follow up with Cardiology as instructed .   Home Health:NO     Discharge Condition:stable .  CODE STATUS:  (FULL)   Diet recommendation: Heart Healthy .  Brief/Interim Summary:   83 y.o. male with medical history significant of CAD s/p of CABG 2003, hyperlipidemia, hypothyroidism, gout, depression, PMR, NSVT, CKD-3, chronic combined systolic and diastolic CHF with EF 30-86%, anemia, hypothyroidism, , who presents with chest pain , typical , resolved with nitroglycerin and aspirin, Toprol was negative 2 , EKG no NEW  ischemic change cardiology was consulted and recommended a stress test done as an outpatient the patient can be discharged home safely continue the cardioprotective medications as before.     Discharge Diagnoses:  Principal Problem:   Chest pain Active Problems:   CAD (coronary artery disease)   Hyperlipidemia   NSVT (nonsustained ventricular tachycardia) ([facility])   S/P CABG x 2   CKD (chronic kidney disease), stage III   Chronic combined systolic and diastolic CHF (congestive heart failure) ([facility])   Hypothyroidism (acquired)   Depression   Elevated blood pressure reading    Discharge Instructions  Discharge Instructions    Diet - low sodium heart healthy    Complete by:  As directed    Increase activity slowly    Complete by:  As directed      Allergies as of 10/29/2016      Reactions   Atenolol Diarrhea, Other (See Comments)   Very weak, fatigued, lethargic, no appetite      Medication List    TAKE these medications   amiodarone 200 MG tablet Commonly known as:  PACERONE Amiodarone to 1/2 tablet (100 mg) daily Monday - Friday. Do not take on Saturday and Sunday.   aspirin 81 MG tablet Take 81 mg by  mouth daily.   ezetimibe 10 MG tablet Commonly known as:  ZETIA Take 10 mg by mouth daily.   levothyroxine 125 MCG tablet Commonly known as:  SYNTHROID, LEVOTHROID Take 1 tablet by mouth daily before breakfast.   mirtazapine 7.5 MG tablet Commonly known as:  REMERON Take 7.5 mg by mouth at bedtime.      Follow-up Information    [name], [name], MD. Go on 01/28/2017.   Specialty:  Cardiology Why:  @ 10:20am  Contact information: [address] [place] [address] [place]. Go on 10/30/2016.   Specialty:  Cardiology Why:  @3 :30pm for your stres test. Nothing to eat after midnight TONIGHT. Contact information: [address] [place] [address] [phone]         Allergies  Allergen Reactions  . Atenolol Diarrhea and Other (See Comments)    Very weak, fatigued, lethargic, no appetite    Consultations:  Cards consultation .   Procedures/Studies: Dg Chest 2 View  Result Date: 10/29/2016 CLINICAL DATA:  Acute onset of generalized chest pain and shortness of breath. Initial encounter. EXAM: CHEST  2 VIEW COMPARISON:  None. FINDINGS: The lungs are well-aerated. Minimal left basilar opacity likely reflects atelectasis. There is no evidence of pleural effusion or pneumothorax. The heart is normal in size; the patient is status post median sternotomy. No acute osseous abnormalities are seen. Clips are noted at both axilla.  IMPRESSION: Minimal left basilar opacity likely reflects atelectasis. Lungs otherwise clear. Electronically Signed   By: Garald Balding M.D.   On: 10/29/2016 04:34       Subjective:  See progress note.  Discharge Exam: Vitals:   10/29/16 0759 10/29/16 1218  BP: (!) 159/52 (!) 131/52  Pulse: (!) 56 62  Resp: 18 18  Temp: 97.4 F (36.3 C) 97.9 F (36.6 C)   Vitals:   10/29/16 0600 10/29/16 0629 10/29/16 0759 10/29/16 1218  BP: (!) 156/62 (!) 149/52 (!) 159/52 (!)  131/52  Pulse: (!) 58 (!) 59 (!) 56 62  Resp: 20 20 18 18   Temp:  97.7 F (36.5 C) 97.4 F (36.3 C) 97.9 F (36.6 C)  TempSrc:  Oral Oral Oral  SpO2: 100% 100% 100% 98%  Weight:  67.1 kg (148 lb)    Height:  6' (1.829 m)      General: Pt is alert, awake, not in acute distress. Cardiovascular: Regular is regular rate S1-S2 no rubs or murmurs. Respiratory: Clear to auscultation bilaterally . Abdominal: Soft, NT, ND, bowel sounds +. Extremities: no edema, no cyanosis.    The results of significant diagnostics from this hospitalization (including imaging, microbiology, ancillary and laboratory) are listed below for reference.     Microbiology: No results found for this or any previous visit (from the past 240 hour(s)).   Labs: BNP (last 3 results)  Recent Labs  10/29/16 0536  BNP 836.6*   Basic Metabolic Panel:  Recent Labs Lab 10/29/16 0343  NA 139  K 4.0  CL 108  CO2 21*  GLUCOSE 121*  BUN 22*  CREATININE 1.38*  CALCIUM 8.9   Liver Function Tests: No results for input(s): AST, ALT, ALKPHOS, BILITOT, PROT, ALBUMIN in the last 168 hours. No results for input(s): LIPASE, AMYLASE in the last 168 hours. No results for input(s): AMMONIA in the last 168 hours. CBC:  Recent Labs Lab 10/29/16 0343  WBC 10.0  HGB 11.5*  HCT 36.1*  MCV 91.4  PLT 288   Cardiac Enzymes:  Recent Labs Lab 10/29/16 1115  TROPONINI <0.03   BNP: Invalid input(s): POCBNP CBG: No results for input(s): GLUCAP in the last 168 hours. D-Dimer No results for input(s): DDIMER in the last 72 hours. Hgb A1c No results for input(s): HGBA1C in the last 72 hours. Lipid Profile No results for input(s): CHOL, HDL, LDLCALC, TRIG, CHOLHDL, LDLDIRECT in the last 72 hours. Thyroid function studies No results for input(s): TSH, T4TOTAL, T3FREE, THYROIDAB in the last 72 hours.  Invalid input(s): FREET3 Anemia work up No results for input(s): VITAMINB12, FOLATE, FERRITIN, TIBC, IRON,  RETICCTPCT in the last 72 hours. Urinalysis No results found for: COLORURINE, APPEARANCEUR, LABSPEC, [place], GLUCOSEU, HGBUR, BILIRUBINUR, KETONESUR, PROTEINUR, UROBILINOGEN, NITRITE, LEUKOCYTESUR Sepsis Labs Invalid input(s): PROCALCITONIN,  WBC,  LACTICIDVEN Microbiology No results found for this or any previous visit (from the past 240 hour(s)).   Time coordinating discharge: Over 30 minutes  SIGNED:   Waldron Session, MD  [facility] 10/29/2016, 2:03 PM Pager   If 7PM-7AM, please contact night-coverage [email or web address] Password [email or web address]

## 2016-10-29 NOTE — Progress Notes (Signed)
  Echocardiogram 2D Echocardiogram has been performed.  Gary Gonzalez 10/29/2016, 12:17 PM

## 2016-10-29 NOTE — Progress Notes (Signed)
PROGRESS NOTE    Gary Gonzalez  NLG:[id number] DOB: 03-06-1927 DOA: 10/29/2016 PCP: [name], MD Outpatient Specialists:    Brief Narrative:    Admitted with CP , day 1.   Assessment & Plan:   Principal Problem:    Chest pain : Resolved , First trop is negative , CP has improved with NTG/ASA , EKG no new changes , will trend the trop and ask the cardiology input AND FOLLOW UP with their recommendations ,  of notice patient has CKD which explains his mildly elevated BNP , but he is not in apparent CHF clinically .   Hx of  CAD (coronary artery disease)  s/p of CABG 2003 .  Hx of  Hyperlipidemia  Hx of NSVT (nonsustained ventricular tachycardia) (HCC)  Hx of CKD (chronic kidney disease), stage III  Hx of  Chronic combined systolic and diastolic CHF (congestive heart failure) (HCC)  Hx of  Hypothyroidism (acquired)  Hx of Depression  Hx of  HTH .        DVT prophylaxis: (Lovenox ) Code Status: (Full )    Consultants:   Cards     Subjective: Patient states his chest pain is subsiding , denies any SOB or fevers or chills , no other issues .  Objective: Vitals:   10/29/16 0530 10/29/16 0600 10/29/16 0629 10/29/16 0759  BP: (!) 159/72 (!) 156/62 (!) 149/52 (!) 159/52  Pulse: (!) 58 (!) 58 (!) 59 (!) 56  Resp: 20 20 20 18   Temp:   97.7 F (36.5 C) 97.4 F (36.3 C)  TempSrc:   Oral Oral  SpO2: 100% 100% 100% 100%  Weight:   67.1 kg (148 lb)   Height:   6' (1.829 m)    No intake or output data in the 24 hours ending 10/29/16 1025 Filed Weights   10/29/16 0342 10/29/16 0629  Weight: 63.5 kg (140 lb) 67.1 kg (148 lb)    Examination:  General exam: Appears calm and comfortable  Respiratory system: Clear to auscultation. Respiratory effort normal. Cardiovascular system: S1 & S2 heard, RRR. No JVD, murmurs, rubs, gallops or clicks. No pedal edema. Gastrointestinal system: Abdomen is nondistended, soft and nontender. No organomegaly or masses felt. Normal  bowel sounds heard. Central nervous system: Alert and oriented. No focal neurological deficits. Extremities: Symmetric 5 x 5 power. Skin: No rashes, lesions or ulcers Psychiatry: Judgement and insight appear normal. Mood & affect appropriate.     Data Reviewed: I have personally reviewed following labs and imaging studies  CBC:  Recent Labs Lab 10/29/16 0343  WBC 10.0  HGB 11.5*  HCT 36.1*  MCV 91.4  PLT 081   Basic Metabolic Panel:  Recent Labs Lab 10/29/16 0343  NA 139  K 4.0  CL 108  CO2 21*  GLUCOSE 121*  BUN 22*  CREATININE 1.38*  CALCIUM 8.9   GFR: Estimated Creatinine Clearance: 35.1 mL/min (A) (by C-G formula based on SCr of 1.38 mg/dL (H)). Liver Function Tests: No results for input(s): AST, ALT, ALKPHOS, BILITOT, PROT, ALBUMIN in the last 168 hours. No results for input(s): LIPASE, AMYLASE in the last 168 hours. No results for input(s): AMMONIA in the last 168 hours. Coagulation Profile: No results for input(s): INR, PROTIME in the last 168 hours. Cardiac Enzymes: No results for input(s): CKTOTAL, CKMB, CKMBINDEX, TROPONINI in the last 168 hours. BNP (last 3 results) No results for input(s): PROBNP in the last 8760 hours. HbA1C: No results for input(s): HGBA1C in the last  72 hours. CBG: No results for input(s): GLUCAP in the last 168 hours. Lipid Profile: No results for input(s): CHOL, HDL, LDLCALC, TRIG, CHOLHDL, LDLDIRECT in the last 72 hours. Thyroid Function Tests: No results for input(s): TSH, T4TOTAL, FREET4, T3FREE, THYROIDAB in the last 72 hours. Anemia Panel: No results for input(s): VITAMINB12, FOLATE, FERRITIN, TIBC, IRON, RETICCTPCT in the last 72 hours. Urine analysis: No results found for: COLORURINE, APPEARANCEUR, LABSPEC, PHURINE, GLUCOSEU, HGBUR, BILIRUBINUR, KETONESUR, PROTEINUR, UROBILINOGEN, NITRITE, LEUKOCYTESUR Sepsis Labs: @LABRCNTIP (procalcitonin:4,lacticidven:4)  )No results found for this or any previous visit (from the  past 240 hour(s)).       Radiology Studies: Dg Chest 2 View  Result Date: 10/29/2016 CLINICAL DATA:  Acute onset of generalized chest pain and shortness of breath. Initial encounter. EXAM: CHEST  2 VIEW COMPARISON:  None. FINDINGS: The lungs are well-aerated. Minimal left basilar opacity likely reflects atelectasis. There is no evidence of pleural effusion or pneumothorax. The heart is normal in size; the patient is status post median sternotomy. No acute osseous abnormalities are seen. Clips are noted at both axilla. IMPRESSION: Minimal left basilar opacity likely reflects atelectasis. Lungs otherwise clear. Electronically Signed   By: Garald Balding M.D.   On: 10/29/2016 04:34        Scheduled Meds: . amiodarone  100 mg Oral Once per day on Mon Tue Wed Thu Fri  . aspirin  325 mg Oral Daily  . enoxaparin (LOVENOX) injection  30 mg Subcutaneous Q24H  . ezetimibe  10 mg Oral Daily  . levothyroxine  125 mcg Oral QAC breakfast  . mirtazapine  7.5 mg Oral QHS   Continuous Infusions:   LOS: 0 days    Time spent: 35 minutes     Waldron Session, MD [facility] Pager [phone]  If 7PM-7AM, please contact night-coverage [email or web address] Password [facility] 10/29/2016, 10:25 AM

## 2016-10-29 NOTE — ED Triage Notes (Signed)
BIB EMS from home, reports central CP X few hours. Also endorses SOB and nausea. Given 324 ASA and 1TNG en route. Pain currently 3/10, pressure like. Labored breathing.

## 2016-10-30 ENCOUNTER — Encounter (HOSPITAL_COMMUNITY): Payer: PPO

## 2016-12-04 ENCOUNTER — Telehealth (HOSPITAL_COMMUNITY): Payer: Self-pay | Admitting: Physician Assistant

## 2016-12-04 NOTE — Telephone Encounter (Signed)
I returned the patient's call and spoke with his wife . The patient's message was that someone had attempted to contact him twice about scheduling a chest x-ray and the patient was confused. I spoke with the patient's wife and informed her that there was no order in the system for a chest x-ray , but there was an order for him to have a myocardial perfusion per Angelena Form PA-C. The wife verbalized that the patient was not thrilled to have this test and she would speak with him about it and she would call me back. The patient's wife was given my direct number.

## 2016-12-19 ENCOUNTER — Telehealth (HOSPITAL_COMMUNITY): Payer: Self-pay | Admitting: Physician Assistant

## 2016-12-19 NOTE — Telephone Encounter (Signed)
User: Cherie Dark A Date/time: 12/19/2016 1:48 PM  Comment: Called pt and lmsg for him to CB to schedule myoview.   Context: [place] Schedule Orders/Appt Requests Outcome: Left Message  Phone number: [phone] Phone Type: Home Phone  Comm. type: Telephone Call type: Outgoing  Contact: [name] Relation to patient: Self  Letter:      User: Cherie Dark A Date/time: 12/14/2016 2:48 PM  Comment: Called pt and lmsg for him to CB to r/s myoview..  Context: [place] Schedule Orders/Appt Requests Outcome: Left Message  Phone number: [phone] Phone Type: Home Phone  Comm. type: Telephone Call type: Outgoing  Contact: [name] Relation to patient: Self  Letter:      User: Cherie Dark A Date/time: 11/27/2016 2:29 PM  Comment: Called pt and lmsg for him to CB to sch myoview.   Context: [place] Schedule Orders/Appt Requests Outcome: Left Message  Phone number: [phone] Phone Type: Home Phone  Comm. type: Telephone Call type: Outgoing  Contact: [name] Relation to patient: Self  Letter:

## 2016-12-26 DIAGNOSIS — E038 Other specified hypothyroidism: Secondary | ICD-10-CM | POA: Diagnosis not present

## 2017-01-28 ENCOUNTER — Ambulatory Visit: Payer: PPO | Admitting: Cardiology

## 2017-02-11 DIAGNOSIS — R7301 Impaired fasting glucose: Secondary | ICD-10-CM | POA: Diagnosis not present

## 2017-02-11 DIAGNOSIS — I509 Heart failure, unspecified: Secondary | ICD-10-CM | POA: Diagnosis not present

## 2017-02-11 DIAGNOSIS — R5383 Other fatigue: Secondary | ICD-10-CM | POA: Diagnosis not present

## 2017-02-11 DIAGNOSIS — Z23 Encounter for immunization: Secondary | ICD-10-CM | POA: Diagnosis not present

## 2017-02-11 DIAGNOSIS — E038 Other specified hypothyroidism: Secondary | ICD-10-CM | POA: Diagnosis not present

## 2017-02-11 DIAGNOSIS — I493 Ventricular premature depolarization: Secondary | ICD-10-CM | POA: Diagnosis not present

## 2017-02-11 DIAGNOSIS — I2510 Atherosclerotic heart disease of native coronary artery without angina pectoris: Secondary | ICD-10-CM | POA: Diagnosis not present

## 2017-02-11 DIAGNOSIS — Z6820 Body mass index (BMI) 20.0-20.9, adult: Secondary | ICD-10-CM | POA: Diagnosis not present

## 2017-02-12 DIAGNOSIS — D649 Anemia, unspecified: Secondary | ICD-10-CM | POA: Diagnosis not present

## 2017-02-20 ENCOUNTER — Ambulatory Visit: Payer: PPO | Admitting: Internal Medicine

## 2017-02-20 ENCOUNTER — Ambulatory Visit (INDEPENDENT_AMBULATORY_CARE_PROVIDER_SITE_OTHER): Payer: PPO | Admitting: Internal Medicine

## 2017-02-20 ENCOUNTER — Encounter: Payer: Self-pay | Admitting: Internal Medicine

## 2017-02-20 VITALS — BP 160/72 | HR 60 | Ht 72.0 in | Wt 153.0 lb

## 2017-02-20 DIAGNOSIS — I493 Ventricular premature depolarization: Secondary | ICD-10-CM | POA: Diagnosis not present

## 2017-02-20 DIAGNOSIS — Z79899 Other long term (current) drug therapy: Secondary | ICD-10-CM | POA: Diagnosis not present

## 2017-02-20 NOTE — Patient Instructions (Addendum)

## 2017-02-20 NOTE — Progress Notes (Signed)
HPI Gary Gonzalez returns today for ongoing evaluation and management of his PVC's in the setting of CAD. He has done well in the interim. The nausea and fatigue he experienced on higher dose amiodarone has resolved on 500 mg a week. He has not had any symptomatic PVC"s. No syncope or chest pain or sob.  Allergies  Allergen Reactions  . Atenolol Diarrhea and Other (See Comments)    Very weak, fatigued, lethargic, no appetite     Current Outpatient Prescriptions  Medication Sig Dispense Refill  . amiodarone (PACERONE) 200 MG tablet 200 mg. Take 1/2 tablet by mouth Monday thru Friday    . aspirin 81 MG tablet Take 81 mg by mouth daily.    Marland Kitchen ezetimibe (ZETIA) 10 MG tablet Take 10 mg by mouth daily.    Marland Kitchen levothyroxine (SYNTHROID, LEVOTHROID) 125 MCG tablet Take 1 tablet by mouth daily before breakfast.    . mirtazapine (REMERON) 7.5 MG tablet Take 7.5 mg by mouth at bedtime.     No current facility-administered medications for this visit.      Past Medical History:  Diagnosis Date  . Anemia   . Arthritis    "touch in the fingers" (11/17/2014)  . Chronic combined systolic and diastolic CHF (congestive heart failure) ([place])    a. 11/17/14 showed EF 25-30%, suboptimal image quality, diffuse hypokinesis worse in the inferior wall, mild to moderate MR. New drop EF compared to 2012 but cath was stable.  . CKD (chronic kidney disease), stage III ([place])   . Coronary artery disease    a. s/p 2v CABG (seq LIMA to Diag and LAD, SVG to OM) on 05/15/2001.  . Fibromyalgia    "a long time ago" (11/17/2014)  . History of gout   . Hyperlipidemia   . Hypothyroidism   . LBBB (left bundle branch block)   . Melanoma of back ([place])   . NSVT (nonsustained ventricular tachycardia) ([place])   . PMR (polymyalgia rheumatica) (HCC)   . PVC's (premature ventricular contractions)   . Sleep apnea    "wife says I do" (11/18/2014)    ROS:   All systems reviewed and negative except as noted in the HPI.   Past  Surgical History:  Procedure Laterality Date  . AXILLARY LYMPH NODE DISSECTION Bilateral 2001  . CARDIAC CATHETERIZATION  04/28/2001  . CARDIAC CATHETERIZATION N/A 11/19/2014   Procedure: Right/Left Heart Cath and Coronary/Graft Angiography;  Surgeon: Jettie Booze, MD;  Location: [place] [facility];  Service: Cardiovascular;  Laterality: N/A;  . CATARACT EXTRACTION W/ INTRAOCULAR LENS  IMPLANT, BILATERAL Bilateral ~ 2010  . CORONARY ARTERY BYPASS GRAFT  05/15/2001   x3 sequential LIMA to diag and LAD, SVG to OM   . MELANOMA EXCISION  2001   off of back     Family History  Problem Relation Age of Onset  . Prostate cancer Father   . Liver cancer Mother   . Hyperlipidemia Son   . Hyperlipidemia Daughter      Social History   Social History  . Marital status: Married    Spouse name: N/A  . Number of children: N/A  . Years of education: N/A   Occupational History  . Not on file.   Social History Main Topics  . Smoking status: Never Smoker  . Smokeless tobacco: Never Used  . Alcohol use 3.6 oz/week    3 Glasses of wine, 3 Cans of beer per week  . Drug use: No  .  Sexual activity: No   Other Topics Concern  . Not on file   Social History Narrative  . No narrative on file     BP (!) 160/72   Pulse 60   Ht 6' (1.829 m)   Wt 153 lb (69.4 kg)   BMI 20.75 kg/m   Physical Exam:  Well appearing 83 yo man, NAD HEENT: Unremarkable Neck:  6 cm JVD, no thyromegally Lymphatics:  No adenopathy Back:  No CVA tenderness Lungs:  Clear with no wheezes HEART:  Regular rate rhythm, no murmurs, no rubs, no clicks Abd:  soft, positive bowel sounds, no organomegally, no rebound, no guarding Ext:  2 plus pulses, no edema, no cyanosis, no clubbing Skin:  No rashes no nodules Neuro:  CN II through XII intact, motor grossly intact  EKG - nsr with no PVC's.   Assess/Plan: 1. PVC's - his symptoms are well controlled. No change in meds. He will continue low dose  amiodarone. 2. CAD - he denies anginal symptoms. He is encouraged to maintain his physical activity.  3. Dyslipidemia - he will continue his current meds.  Mikle Bosworth.D.

## 2017-05-21 ENCOUNTER — Other Ambulatory Visit: Payer: Self-pay | Admitting: *Deleted

## 2017-05-21 MED ORDER — AMIODARONE HCL 200 MG PO TABS: ORAL_TABLET | ORAL | 3 refills | Status: DC

## 2017-05-21 NOTE — Telephone Encounter (Signed)
Please advise on how patient should be taking this as sig listed isn't clear. Thanks, MI

## 2017-08-09 DIAGNOSIS — Z85828 Personal history of other malignant neoplasm of skin: Secondary | ICD-10-CM | POA: Diagnosis not present

## 2017-08-09 DIAGNOSIS — L821 Other seborrheic keratosis: Secondary | ICD-10-CM | POA: Diagnosis not present

## 2017-08-09 DIAGNOSIS — D0439 Carcinoma in situ of skin of other parts of face: Secondary | ICD-10-CM | POA: Diagnosis not present

## 2017-08-09 DIAGNOSIS — D485 Neoplasm of uncertain behavior of skin: Secondary | ICD-10-CM | POA: Diagnosis not present

## 2017-08-09 DIAGNOSIS — D0362 Melanoma in situ of left upper limb, including shoulder: Secondary | ICD-10-CM | POA: Diagnosis not present

## 2017-08-09 DIAGNOSIS — L570 Actinic keratosis: Secondary | ICD-10-CM | POA: Diagnosis not present

## 2017-08-09 DIAGNOSIS — Z85820 Personal history of malignant melanoma of skin: Secondary | ICD-10-CM | POA: Diagnosis not present

## 2017-09-04 DIAGNOSIS — L089 Local infection of the skin and subcutaneous tissue, unspecified: Secondary | ICD-10-CM | POA: Diagnosis not present

## 2017-09-04 DIAGNOSIS — Z85828 Personal history of other malignant neoplasm of skin: Secondary | ICD-10-CM | POA: Diagnosis not present

## 2017-09-04 DIAGNOSIS — D0362 Melanoma in situ of left upper limb, including shoulder: Secondary | ICD-10-CM | POA: Diagnosis not present

## 2017-09-10 DIAGNOSIS — M109 Gout, unspecified: Secondary | ICD-10-CM | POA: Diagnosis not present

## 2017-09-10 DIAGNOSIS — E7849 Other hyperlipidemia: Secondary | ICD-10-CM | POA: Diagnosis not present

## 2017-09-10 DIAGNOSIS — R7301 Impaired fasting glucose: Secondary | ICD-10-CM | POA: Diagnosis not present

## 2017-09-10 DIAGNOSIS — Z125 Encounter for screening for malignant neoplasm of prostate: Secondary | ICD-10-CM | POA: Diagnosis not present

## 2017-09-10 DIAGNOSIS — E038 Other specified hypothyroidism: Secondary | ICD-10-CM | POA: Diagnosis not present

## 2017-09-10 DIAGNOSIS — R82998 Other abnormal findings in urine: Secondary | ICD-10-CM | POA: Diagnosis not present

## 2017-09-16 ENCOUNTER — Ambulatory Visit: Payer: PPO | Admitting: Internal Medicine

## 2017-09-16 DIAGNOSIS — Z0000 Encounter for general adult medical examination without abnormal findings: Secondary | ICD-10-CM | POA: Diagnosis not present

## 2017-09-16 DIAGNOSIS — E7849 Other hyperlipidemia: Secondary | ICD-10-CM | POA: Diagnosis not present

## 2017-09-16 DIAGNOSIS — Z1389 Encounter for screening for other disorder: Secondary | ICD-10-CM | POA: Diagnosis not present

## 2017-09-16 DIAGNOSIS — R5383 Other fatigue: Secondary | ICD-10-CM | POA: Diagnosis not present

## 2017-09-16 DIAGNOSIS — N183 Chronic kidney disease, stage 3 (moderate): Secondary | ICD-10-CM | POA: Diagnosis not present

## 2017-09-16 DIAGNOSIS — B029 Zoster without complications: Secondary | ICD-10-CM | POA: Diagnosis not present

## 2017-09-16 DIAGNOSIS — I509 Heart failure, unspecified: Secondary | ICD-10-CM | POA: Diagnosis not present

## 2017-09-16 DIAGNOSIS — G6289 Other specified polyneuropathies: Secondary | ICD-10-CM | POA: Diagnosis not present

## 2017-09-16 DIAGNOSIS — I2510 Atherosclerotic heart disease of native coronary artery without angina pectoris: Secondary | ICD-10-CM | POA: Diagnosis not present

## 2017-09-16 DIAGNOSIS — Z6821 Body mass index (BMI) 21.0-21.9, adult: Secondary | ICD-10-CM | POA: Diagnosis not present

## 2017-09-16 DIAGNOSIS — R296 Repeated falls: Secondary | ICD-10-CM | POA: Diagnosis not present

## 2017-09-16 DIAGNOSIS — C439 Malignant melanoma of skin, unspecified: Secondary | ICD-10-CM | POA: Diagnosis not present

## 2017-10-09 ENCOUNTER — Ambulatory Visit: Payer: PPO | Admitting: Internal Medicine

## 2017-10-09 ENCOUNTER — Encounter: Payer: Self-pay | Admitting: Internal Medicine

## 2017-10-09 VITALS — BP 130/62 | HR 70 | Ht 72.0 in | Wt 153.0 lb

## 2017-10-09 DIAGNOSIS — Z951 Presence of aortocoronary bypass graft: Secondary | ICD-10-CM

## 2017-10-09 DIAGNOSIS — Z79899 Other long term (current) drug therapy: Secondary | ICD-10-CM | POA: Diagnosis not present

## 2017-10-09 DIAGNOSIS — I493 Ventricular premature depolarization: Secondary | ICD-10-CM | POA: Diagnosis not present

## 2017-10-09 NOTE — Patient Instructions (Addendum)

## 2017-10-09 NOTE — Progress Notes (Addendum)
HPI Mr. Valverde returns today for followup of his PVC's. The patient is a very pleasant 84 yo man with a h/o CAD who underwent initiation of low dose amio after developing a pVC induced CM. He initially struggled to take the [facility] but with gradual dose reductions, he has done well. No anginal symptoms. He remains a little unsteady on his feet but he does use a walker and has not fallen. No edema. Allergies  Allergen Reactions  . Atenolol Diarrhea and Other (See Comments)    Very weak, fatigued, lethargic, no appetite     Current Outpatient Medications  Medication Sig Dispense Refill  . amiodarone (PACERONE) 200 MG tablet Amiodarone 1/2 tablet (100 mg) daily Monday - Friday, do not take on Saturday and Sunday 30 tablet 3  . aspirin 81 MG tablet Take 81 mg by mouth daily.    Marland Kitchen ezetimibe (ZETIA) 10 MG tablet Take 10 mg by mouth daily.    Marland Kitchen levothyroxine (SYNTHROID, LEVOTHROID) 125 MCG tablet Take 1 tablet by mouth daily before breakfast.    . mirtazapine (REMERON) 7.5 MG tablet Take 7.5 mg by mouth at bedtime.    Marland Kitchen PRALUENT 150 MG/ML SOPN Inject 150 mg as directed every 30 (thirty) days.      No current facility-administered medications for this visit.      Past Medical History:  Diagnosis Date  . Anemia   . Arthritis    "touch in the fingers" (11/17/2014)  . Chronic combined systolic and diastolic CHF (congestive heart failure) ([place])    a. 11/17/14 showed EF 25-30%, suboptimal image quality, diffuse hypokinesis worse in the inferior wall, mild to moderate MR. New drop EF compared to 2012 but cath was stable.  . CKD (chronic kidney disease), stage III ([place])   . Coronary artery disease    a. s/p 2v CABG (seq LIMA to Diag and LAD, SVG to OM) on 05/15/2001.  . Fibromyalgia    "a long time ago" (11/17/2014)  . History of gout   . Hyperlipidemia   . Hypothyroidism   . LBBB (left bundle branch block)   . Melanoma of back ([place])   . NSVT (nonsustained ventricular tachycardia) ([place])   .  PMR (polymyalgia rheumatica) (HCC)   . PVC's (premature ventricular contractions)   . Sleep apnea    "wife says I do" (11/18/2014)    ROS:   All systems reviewed and negative except as noted in the HPI.   Past Surgical History:  Procedure Laterality Date  . AXILLARY LYMPH NODE DISSECTION Bilateral 2001  . CARDIAC CATHETERIZATION  04/28/2001  . CARDIAC CATHETERIZATION N/A 11/19/2014   Procedure: Right/Left Heart Cath and Coronary/Graft Angiography;  Surgeon: Jettie Booze, MD;  Location: [place] [facility];  Service: Cardiovascular;  Laterality: N/A;  . CATARACT EXTRACTION W/ INTRAOCULAR LENS  IMPLANT, BILATERAL Bilateral ~ 2010  . CORONARY ARTERY BYPASS GRAFT  05/15/2001   x3 sequential LIMA to diag and LAD, SVG to OM   . MELANOMA EXCISION  2001   off of back     Family History  Problem Relation Age of Onset  . Prostate cancer Father   . Liver cancer Mother   . Hyperlipidemia Son   . Hyperlipidemia Daughter      Social History   Socioeconomic History  . Marital status: Married    Spouse name: Not on file  . Number of children: Not on file  . Years of education: Not on file  . Highest  education level: Not on file  Occupational History  . Not on file  Social Needs  . Financial resource strain: Not on file  . Food insecurity:    Worry: Not on file    Inability: Not on file  . Transportation needs:    Medical: Not on file    Non-medical: Not on file  Tobacco Use  . Smoking status: Never Smoker  . Smokeless tobacco: Never Used  Substance and Sexual Activity  . Alcohol use: Yes    Alcohol/week: 3.6 oz    Types: 3 Glasses of wine, 3 Cans of beer per week  . Drug use: No  . Sexual activity: Never  Lifestyle  . Physical activity:    Days per week: Not on file    Minutes per session: Not on file  . Stress: Not on file  Relationships  . Social connections:    Talks on phone: Not on file    Gets together: Not on file    Attends religious service: Not on  file    Active member of club or organization: Not on file    Attends meetings of clubs or organizations: Not on file    Relationship status: Not on file  . Intimate partner violence:    Fear of current or ex partner: Not on file    Emotionally abused: Not on file    Physically abused: Not on file    Forced sexual activity: Not on file  Other Topics Concern  . Not on file  Social History Narrative  . Not on file     BP 130/62   Pulse 70   Ht 6' (1.829 m)   Wt 153 lb (69.4 kg)   BMI 20.75 kg/m   Physical Exam:  Well appearing NAD HEENT: Unremarkable Neck:  No JVD, no thyromegally Lymphatics:  No adenopathy Back:  No CVA tenderness Lungs:  Clear with no wheezes HEART:  Regular rate rhythm, no murmurs, no rubs, no clicks Abd:  soft, positive bowel sounds, no organomegally, no rebound, no guarding Ext:  2 plus pulses, no edema, no cyanosis, no clubbing Skin:  No rashes no nodules Neuro:  CN II through XII intact, motor grossly intact  EKG - nsr with IVCD   Assess/Plan: 1. PVC's - the patient is doing well. He will continue his low dose amiodarone.  2. Weakness/dizziness - the patient is starting to work out again. We discussed the importance of staying active.  3. CAD - he denies exertional angina. He will continue his current meds. 4. HTN - his blood pressure is reasonably well controlled. He will continue his current meds and maintain a low sodium diet.  Mikle Bosworth.D.

## 2017-11-04 ENCOUNTER — Other Ambulatory Visit: Payer: Self-pay | Admitting: Internal Medicine

## 2018-01-11 DIAGNOSIS — Z23 Encounter for immunization: Secondary | ICD-10-CM | POA: Diagnosis not present

## 2018-03-14 DIAGNOSIS — D1801 Hemangioma of skin and subcutaneous tissue: Secondary | ICD-10-CM | POA: Diagnosis not present

## 2018-03-14 DIAGNOSIS — Z85820 Personal history of malignant melanoma of skin: Secondary | ICD-10-CM | POA: Diagnosis not present

## 2018-03-14 DIAGNOSIS — L308 Other specified dermatitis: Secondary | ICD-10-CM | POA: Diagnosis not present

## 2018-03-14 DIAGNOSIS — Z85828 Personal history of other malignant neoplasm of skin: Secondary | ICD-10-CM | POA: Diagnosis not present

## 2018-03-14 DIAGNOSIS — L858 Other specified epidermal thickening: Secondary | ICD-10-CM | POA: Diagnosis not present

## 2018-03-14 DIAGNOSIS — L570 Actinic keratosis: Secondary | ICD-10-CM | POA: Diagnosis not present

## 2018-03-14 DIAGNOSIS — L821 Other seborrheic keratosis: Secondary | ICD-10-CM | POA: Diagnosis not present

## 2018-03-19 DIAGNOSIS — I2510 Atherosclerotic heart disease of native coronary artery without angina pectoris: Secondary | ICD-10-CM | POA: Diagnosis not present

## 2018-03-19 DIAGNOSIS — R7301 Impaired fasting glucose: Secondary | ICD-10-CM | POA: Diagnosis not present

## 2018-03-19 DIAGNOSIS — I509 Heart failure, unspecified: Secondary | ICD-10-CM | POA: Diagnosis not present

## 2018-03-19 DIAGNOSIS — R2689 Other abnormalities of gait and mobility: Secondary | ICD-10-CM | POA: Diagnosis not present

## 2018-03-19 DIAGNOSIS — E038 Other specified hypothyroidism: Secondary | ICD-10-CM | POA: Diagnosis not present

## 2018-03-19 DIAGNOSIS — Z6821 Body mass index (BMI) 21.0-21.9, adult: Secondary | ICD-10-CM | POA: Diagnosis not present

## 2018-04-09 ENCOUNTER — Encounter: Payer: Self-pay | Admitting: Internal Medicine

## 2018-04-09 ENCOUNTER — Ambulatory Visit: Payer: PPO | Admitting: Internal Medicine

## 2018-04-09 VITALS — BP 124/60 | HR 76 | Ht 72.0 in | Wt 154.0 lb

## 2018-04-09 DIAGNOSIS — I493 Ventricular premature depolarization: Secondary | ICD-10-CM | POA: Diagnosis not present

## 2018-04-09 DIAGNOSIS — I2510 Atherosclerotic heart disease of native coronary artery without angina pectoris: Secondary | ICD-10-CM | POA: Diagnosis not present

## 2018-04-09 DIAGNOSIS — I2583 Coronary atherosclerosis due to lipid rich plaque: Secondary | ICD-10-CM | POA: Diagnosis not present

## 2018-04-09 DIAGNOSIS — Z951 Presence of aortocoronary bypass graft: Secondary | ICD-10-CM | POA: Diagnosis not present

## 2018-04-09 NOTE — Patient Instructions (Addendum)

## 2018-04-09 NOTE — Progress Notes (Signed)
HPI [name] returns today for followup of his PVC's. The patient is a very pleasant 84 yo man with a h/o CAD who underwent initiation of low dose amio after developing a pVC induced CM. He initially struggled to take the [facility] but with gradual dose reductions, he has done well. No anginal symptoms. He remains a little unsteady on his feet but he does use a walker and has not fallen. No edema. He gets fatigued when he is showering.  Allergies  Allergen Reactions  . Atenolol Diarrhea and Other (See Comments)    Very weak, fatigued, lethargic, no appetite     Current Outpatient Medications  Medication Sig Dispense Refill  . amiodarone (PACERONE) 200 MG tablet TAKE ONE-HALF TABLET (100 MG) BY MOUTH DAILY ON MONDAY THROUGH FRIDAY. DO NOT TAKE ON SATURDAY AND SUNDAY 15 tablet 11  . aspirin 81 MG tablet Take 81 mg by mouth daily.    Marland Kitchen ezetimibe (ZETIA) 10 MG tablet Take 10 mg by mouth daily.    Marland Kitchen levothyroxine (SYNTHROID, LEVOTHROID) 125 MCG tablet Take 1 tablet by mouth daily before breakfast.    . mirtazapine (REMERON) 7.5 MG tablet Take 7.5 mg by mouth at bedtime.     No current facility-administered medications for this visit.      Past Medical History:  Diagnosis Date  . Anemia   . Arthritis    "touch in the fingers" (11/17/2014)  . Chronic combined systolic and diastolic CHF (congestive heart failure) ([place])    a. 11/17/14 showed EF 25-30%, suboptimal image quality, diffuse hypokinesis worse in the inferior wall, mild to moderate MR. New drop EF compared to 2012 but cath was stable.  . CKD (chronic kidney disease), stage III ([place])   . Coronary artery disease    a. s/p 2v CABG (seq LIMA to Diag and LAD, SVG to OM) on 05/15/2001.  . Fibromyalgia    "a long time ago" (11/17/2014)  . History of gout   . Hyperlipidemia   . Hypothyroidism   . LBBB (left bundle branch block)   . Melanoma of back ([place])   . NSVT (nonsustained ventricular tachycardia) ([place])   . PMR (polymyalgia  rheumatica) (HCC)   . PVC's (premature ventricular contractions)   . Sleep apnea    "wife says I do" (11/18/2014)    ROS:   All systems reviewed and negative except as noted in the HPI.   Past Surgical History:  Procedure Laterality Date  . AXILLARY LYMPH NODE DISSECTION Bilateral 2001  . CARDIAC CATHETERIZATION  04/28/2001  . CARDIAC CATHETERIZATION N/A 11/19/2014   Procedure: Right/Left Heart Cath and Coronary/Graft Angiography;  Surgeon: Jettie Booze, MD;  Location: [place] [facility];  Service: Cardiovascular;  Laterality: N/A;  . CATARACT EXTRACTION W/ INTRAOCULAR LENS  IMPLANT, BILATERAL Bilateral ~ 2010  . CORONARY ARTERY BYPASS GRAFT  05/15/2001   x3 sequential LIMA to diag and LAD, SVG to OM   . MELANOMA EXCISION  2001   off of back     Family History  Problem Relation Age of Onset  . Prostate cancer Father   . Liver cancer Mother   . Hyperlipidemia Son   . Hyperlipidemia Daughter      Social History   Socioeconomic History  . Marital status: Married    Spouse name: Not on file  . Number of children: Not on file  . Years of education: Not on file  . Highest education level: Not on file  Occupational  History  . Not on file  Social Needs  . Financial resource strain: Not on file  . Food insecurity:    Worry: Not on file    Inability: Not on file  . Transportation needs:    Medical: Not on file    Non-medical: Not on file  Tobacco Use  . Smoking status: Never Smoker  . Smokeless tobacco: Never Used  Substance and Sexual Activity  . Alcohol use: Yes    Alcohol/week: 6.0 standard drinks    Types: 3 Glasses of wine, 3 Cans of beer per week  . Drug use: No  . Sexual activity: Never  Lifestyle  . Physical activity:    Days per week: Not on file    Minutes per session: Not on file  . Stress: Not on file  Relationships  . Social connections:    Talks on phone: Not on file    Gets together: Not on file    Attends religious service: Not on file      Active member of club or organization: Not on file    Attends meetings of clubs or organizations: Not on file    Relationship status: Not on file  . Intimate partner violence:    Fear of current or ex partner: Not on file    Emotionally abused: Not on file    Physically abused: Not on file    Forced sexual activity: Not on file  Other Topics Concern  . Not on file  Social History Narrative  . Not on file     BP 124/60   Pulse 76   Ht 6' (1.829 m)   Wt 154 lb (69.9 kg)   SpO2 99%   BMI 20.89 kg/m   Physical Exam:  Well appearing NAD HEENT: Unremarkable Neck:  No JVD, no thyromegally Lymphatics:  No adenopathy Back:  No CVA tenderness Lungs:  Clear HEART:  Regular rate rhythm, no murmurs, no rubs, no clicks Abd:  soft, positive bowel sounds, no organomegally, no rebound, no guarding Ext:  2 plus pulses, no edema, no cyanosis, no clubbing Skin:  No rashes no nodules Neuro:  CN II through XII intact, motor grossly intact  EKG - nsr   Assess/Plan: 1. PVC's - he is well controlled on low dose amiodarone. No change in dose. He will undergo watchful wating. 2. Tachy induced CM - he has had a normalization of his LV function.  3. CAD - he remains asymptomatic. No anginal symptoms.  Mikle Bosworth.D.

## 2018-04-16 DIAGNOSIS — E875 Hyperkalemia: Secondary | ICD-10-CM | POA: Diagnosis not present

## 2018-04-16 DIAGNOSIS — N183 Chronic kidney disease, stage 3 (moderate): Secondary | ICD-10-CM | POA: Diagnosis not present

## 2018-10-07 DIAGNOSIS — R82998 Other abnormal findings in urine: Secondary | ICD-10-CM | POA: Diagnosis not present

## 2018-10-07 DIAGNOSIS — E038 Other specified hypothyroidism: Secondary | ICD-10-CM | POA: Diagnosis not present

## 2018-10-07 DIAGNOSIS — Z125 Encounter for screening for malignant neoplasm of prostate: Secondary | ICD-10-CM | POA: Diagnosis not present

## 2018-10-07 DIAGNOSIS — E7849 Other hyperlipidemia: Secondary | ICD-10-CM | POA: Diagnosis not present

## 2018-10-07 DIAGNOSIS — N183 Chronic kidney disease, stage 3 (moderate): Secondary | ICD-10-CM | POA: Diagnosis not present

## 2018-10-07 DIAGNOSIS — M109 Gout, unspecified: Secondary | ICD-10-CM | POA: Diagnosis not present

## 2018-10-07 DIAGNOSIS — R7301 Impaired fasting glucose: Secondary | ICD-10-CM | POA: Diagnosis not present

## 2018-10-14 DIAGNOSIS — I509 Heart failure, unspecified: Secondary | ICD-10-CM | POA: Diagnosis not present

## 2018-10-14 DIAGNOSIS — N183 Chronic kidney disease, stage 3 (moderate): Secondary | ICD-10-CM | POA: Diagnosis not present

## 2018-10-14 DIAGNOSIS — D649 Anemia, unspecified: Secondary | ICD-10-CM | POA: Diagnosis not present

## 2018-10-14 DIAGNOSIS — I2510 Atherosclerotic heart disease of native coronary artery without angina pectoris: Secondary | ICD-10-CM | POA: Diagnosis not present

## 2018-10-14 DIAGNOSIS — Z1331 Encounter for screening for depression: Secondary | ICD-10-CM | POA: Diagnosis not present

## 2018-10-14 DIAGNOSIS — Z0000 Encounter for general adult medical examination without abnormal findings: Secondary | ICD-10-CM | POA: Diagnosis not present

## 2018-10-14 DIAGNOSIS — C439 Malignant melanoma of skin, unspecified: Secondary | ICD-10-CM | POA: Diagnosis not present

## 2018-10-14 DIAGNOSIS — R296 Repeated falls: Secondary | ICD-10-CM | POA: Diagnosis not present

## 2018-10-14 DIAGNOSIS — B029 Zoster without complications: Secondary | ICD-10-CM | POA: Diagnosis not present

## 2018-10-14 DIAGNOSIS — I493 Ventricular premature depolarization: Secondary | ICD-10-CM | POA: Diagnosis not present

## 2018-10-14 DIAGNOSIS — E875 Hyperkalemia: Secondary | ICD-10-CM | POA: Diagnosis not present

## 2018-10-14 DIAGNOSIS — H9190 Unspecified hearing loss, unspecified ear: Secondary | ICD-10-CM | POA: Diagnosis not present

## 2018-10-14 DIAGNOSIS — G629 Polyneuropathy, unspecified: Secondary | ICD-10-CM | POA: Diagnosis not present

## 2018-10-23 ENCOUNTER — Ambulatory Visit: Payer: PPO | Admitting: Internal Medicine

## 2018-11-10 DIAGNOSIS — D1801 Hemangioma of skin and subcutaneous tissue: Secondary | ICD-10-CM | POA: Diagnosis not present

## 2018-11-10 DIAGNOSIS — D485 Neoplasm of uncertain behavior of skin: Secondary | ICD-10-CM | POA: Diagnosis not present

## 2018-11-10 DIAGNOSIS — L570 Actinic keratosis: Secondary | ICD-10-CM | POA: Diagnosis not present

## 2018-11-10 DIAGNOSIS — L821 Other seborrheic keratosis: Secondary | ICD-10-CM | POA: Diagnosis not present

## 2018-11-10 DIAGNOSIS — Z85820 Personal history of malignant melanoma of skin: Secondary | ICD-10-CM | POA: Diagnosis not present

## 2018-11-10 DIAGNOSIS — C4362 Malignant melanoma of left upper limb, including shoulder: Secondary | ICD-10-CM | POA: Diagnosis not present

## 2018-11-10 DIAGNOSIS — Z85828 Personal history of other malignant neoplasm of skin: Secondary | ICD-10-CM | POA: Diagnosis not present

## 2018-11-12 ENCOUNTER — Telehealth: Payer: Self-pay | Admitting: Internal Medicine

## 2018-11-12 NOTE — Telephone Encounter (Signed)

## 2018-11-13 ENCOUNTER — Encounter: Payer: Self-pay | Admitting: Internal Medicine

## 2018-11-13 ENCOUNTER — Other Ambulatory Visit: Payer: Self-pay

## 2018-11-13 ENCOUNTER — Ambulatory Visit: Payer: PPO | Admitting: Internal Medicine

## 2018-11-13 VITALS — BP 140/64 | HR 71 | Ht 72.0 in | Wt 153.6 lb

## 2018-11-13 DIAGNOSIS — I493 Ventricular premature depolarization: Secondary | ICD-10-CM

## 2018-11-13 DIAGNOSIS — I2583 Coronary atherosclerosis due to lipid rich plaque: Secondary | ICD-10-CM | POA: Diagnosis not present

## 2018-11-13 DIAGNOSIS — Z951 Presence of aortocoronary bypass graft: Secondary | ICD-10-CM

## 2018-11-13 DIAGNOSIS — I2510 Atherosclerotic heart disease of native coronary artery without angina pectoris: Secondary | ICD-10-CM

## 2018-11-13 NOTE — Progress Notes (Signed)
HPI Mr. Hnat returns for followup of his symptomatic PVC's and PVC induced CM. He has tolerated low dose amiodarone and appears to not have had much in the way of PVC's. He has chronic fatigue and weakness which may be in part due to the amiodarone. However, off the amiodarone he has symptomatic PVC's. No syncope but he does have some falls.  Allergies  Allergen Reactions  . Atenolol Diarrhea and Other (See Comments)    Very weak, fatigued, lethargic, no appetite     Current Outpatient Medications  Medication Sig Dispense Refill  . amiodarone (PACERONE) 200 MG tablet TAKE ONE-HALF TABLET (100 MG) BY MOUTH DAILY ON MONDAY THROUGH FRIDAY. DO NOT TAKE ON SATURDAY AND SUNDAY 15 tablet 11  . aspirin 81 MG tablet Take 81 mg by mouth daily.    Marland Kitchen ezetimibe (ZETIA) 10 MG tablet Take 10 mg by mouth daily.    Marland Kitchen levothyroxine (SYNTHROID, LEVOTHROID) 125 MCG tablet Take 1 tablet by mouth daily before breakfast.    . mirtazapine (REMERON) 7.5 MG tablet Take 7.5 mg by mouth at bedtime.     No current facility-administered medications for this visit.      Past Medical History:  Diagnosis Date  . Anemia   . Arthritis    "touch in the fingers" (11/17/2014)  . Chronic combined systolic and diastolic CHF (congestive heart failure) ([place])    a. 11/17/14 showed EF 25-30%, suboptimal image quality, diffuse hypokinesis worse in the inferior wall, mild to moderate MR. New drop EF compared to 2012 but cath was stable.  . CKD (chronic kidney disease), stage III ([place])   . Coronary artery disease    a. s/p 2v CABG (seq LIMA to Diag and LAD, SVG to OM) on 05/15/2001.  . Fibromyalgia    "a long time ago" (11/17/2014)  . History of gout   . Hyperlipidemia   . Hypothyroidism   . LBBB (left bundle branch block)   . Melanoma of back ([place])   . NSVT (nonsustained ventricular tachycardia) ([place])   . PMR (polymyalgia rheumatica) ([facility])   . PVC's (premature ventricular contractions)   . Sleep apnea    "wife  says I do" (11/18/2014)    ROS:   All systems reviewed and negative except as noted in the HPI.   Past Surgical History:  Procedure Laterality Date  . AXILLARY LYMPH NODE DISSECTION Bilateral 2001  . CARDIAC CATHETERIZATION  04/28/2001  . CARDIAC CATHETERIZATION N/A 11/19/2014   Procedure: Right/Left Heart Cath and Coronary/Graft Angiography;  Surgeon: Jettie Booze, MD;  Location: [facility];  Service: Cardiovascular;  Laterality: N/A;  . CATARACT EXTRACTION W/ INTRAOCULAR LENS  IMPLANT, BILATERAL Bilateral ~ 2010  . CORONARY ARTERY BYPASS GRAFT  05/15/2001   x3 sequential LIMA to diag and LAD, SVG to OM   . MELANOMA EXCISION  2001   off of back     Family History  Problem Relation Age of Onset  . Prostate cancer Father   . Liver cancer Mother   . Hyperlipidemia Son   . Hyperlipidemia Daughter      Social History   Socioeconomic History  . Marital status: Married    Spouse name: Not on file  . Number of children: Not on file  . Years of education: Not on file  . Highest education level: Not on file  Occupational History  . Not on file  Social Needs  . Financial resource strain: Not on file  .  Food insecurity    Worry: Not on file    Inability: Not on file  . Transportation needs    Medical: Not on file    Non-medical: Not on file  Tobacco Use  . Smoking status: Never Smoker  . Smokeless tobacco: Never Used  Substance and Sexual Activity  . Alcohol use: Yes    Alcohol/week: 6.0 standard drinks    Types: 3 Glasses of wine, 3 Cans of beer per week  . Drug use: No  . Sexual activity: Never  Lifestyle  . Physical activity    Days per week: Not on file    Minutes per session: Not on file  . Stress: Not on file  Relationships  . Social Herbalist on phone: Not on file    Gets together: Not on file    Attends religious service: Not on file    Active member of club or organization: Not on file    Attends meetings of clubs or  organizations: Not on file    Relationship status: Not on file  . Intimate partner violence    Fear of current or ex partner: Not on file    Emotionally abused: Not on file    Physically abused: Not on file    Forced sexual activity: Not on file  Other Topics Concern  . Not on file  Social History Narrative  . Not on file     BP 140/64   Pulse 71   Ht 6' (1.829 m)   Wt 153 lb 9.6 oz (69.7 kg)   SpO2 99%   BMI 20.83 kg/m   Physical Exam:  Well appearing NAD HEENT: Unremarkable Neck:  No JVD, no thyromegally Lymphatics:  No adenopathy Back:  No CVA tenderness Lungs:  Clear with no wheezes HEART:  Regular rate rhythm, no murmurs, no rubs, no clicks Abd:  soft, positive bowel sounds, no organomegally, no rebound, no guarding Ext:  2 plus pulses, no edema, no cyanosis, no clubbing Skin:  No rashes no nodules Neuro:  CN II through XII intact, motor grossly intact  EKG - NSR with IVCD  DEVICE  Normal device function.  See PaceArt for details.   Assess/Plan: 1. PVC - he is stable and has not had any additional symptoms. He will continue his low dose amiodarone. 2. PVC induced CM - his EF had normalized at last echo. He will continue his current meds.  Mikle Bosworth.D.

## 2018-11-13 NOTE — Patient Instructions (Signed)

## 2018-11-25 DIAGNOSIS — Z85820 Personal history of malignant melanoma of skin: Secondary | ICD-10-CM | POA: Diagnosis not present

## 2018-11-25 DIAGNOSIS — C4362 Malignant melanoma of left upper limb, including shoulder: Secondary | ICD-10-CM | POA: Diagnosis not present

## 2018-11-25 DIAGNOSIS — Z85828 Personal history of other malignant neoplasm of skin: Secondary | ICD-10-CM | POA: Diagnosis not present

## 2019-01-15 DIAGNOSIS — Z23 Encounter for immunization: Secondary | ICD-10-CM | POA: Diagnosis not present

## 2019-01-18 ENCOUNTER — Other Ambulatory Visit: Payer: Self-pay | Admitting: Internal Medicine

## 2019-01-21 ENCOUNTER — Other Ambulatory Visit: Payer: Self-pay | Admitting: Internal Medicine

## 2019-04-14 DIAGNOSIS — G629 Polyneuropathy, unspecified: Secondary | ICD-10-CM | POA: Diagnosis not present

## 2019-04-14 DIAGNOSIS — C439 Malignant melanoma of skin, unspecified: Secondary | ICD-10-CM | POA: Diagnosis not present

## 2019-04-14 DIAGNOSIS — E291 Testicular hypofunction: Secondary | ICD-10-CM | POA: Diagnosis not present

## 2019-04-14 DIAGNOSIS — D649 Anemia, unspecified: Secondary | ICD-10-CM | POA: Diagnosis not present

## 2019-04-14 DIAGNOSIS — H9190 Unspecified hearing loss, unspecified ear: Secondary | ICD-10-CM | POA: Diagnosis not present

## 2019-04-14 DIAGNOSIS — I2510 Atherosclerotic heart disease of native coronary artery without angina pectoris: Secondary | ICD-10-CM | POA: Diagnosis not present

## 2019-04-14 DIAGNOSIS — R7301 Impaired fasting glucose: Secondary | ICD-10-CM | POA: Diagnosis not present

## 2019-04-14 DIAGNOSIS — I509 Heart failure, unspecified: Secondary | ICD-10-CM | POA: Diagnosis not present

## 2019-04-14 DIAGNOSIS — N1831 Chronic kidney disease, stage 3a: Secondary | ICD-10-CM | POA: Diagnosis not present

## 2019-04-14 DIAGNOSIS — R5383 Other fatigue: Secondary | ICD-10-CM | POA: Diagnosis not present

## 2019-04-14 DIAGNOSIS — M109 Gout, unspecified: Secondary | ICD-10-CM | POA: Diagnosis not present

## 2019-04-14 DIAGNOSIS — I493 Ventricular premature depolarization: Secondary | ICD-10-CM | POA: Diagnosis not present

## 2019-04-15 DIAGNOSIS — E7849 Other hyperlipidemia: Secondary | ICD-10-CM | POA: Diagnosis not present

## 2019-04-15 DIAGNOSIS — E038 Other specified hypothyroidism: Secondary | ICD-10-CM | POA: Diagnosis not present

## 2019-04-15 DIAGNOSIS — R7301 Impaired fasting glucose: Secondary | ICD-10-CM | POA: Diagnosis not present

## 2019-05-13 DIAGNOSIS — L570 Actinic keratosis: Secondary | ICD-10-CM | POA: Diagnosis not present

## 2019-05-13 DIAGNOSIS — D1801 Hemangioma of skin and subcutaneous tissue: Secondary | ICD-10-CM | POA: Diagnosis not present

## 2019-05-13 DIAGNOSIS — L821 Other seborrheic keratosis: Secondary | ICD-10-CM | POA: Diagnosis not present

## 2019-05-13 DIAGNOSIS — Z85828 Personal history of other malignant neoplasm of skin: Secondary | ICD-10-CM | POA: Diagnosis not present

## 2019-05-13 DIAGNOSIS — Z85820 Personal history of malignant melanoma of skin: Secondary | ICD-10-CM | POA: Diagnosis not present

## 2019-05-13 DIAGNOSIS — L578 Other skin changes due to chronic exposure to nonionizing radiation: Secondary | ICD-10-CM | POA: Diagnosis not present

## 2019-05-13 DIAGNOSIS — L853 Xerosis cutis: Secondary | ICD-10-CM | POA: Diagnosis not present

## 2019-05-21 ENCOUNTER — Ambulatory Visit: Payer: PPO

## 2019-05-21 ENCOUNTER — Ambulatory Visit: Payer: PPO | Attending: Internal Medicine

## 2019-05-21 DIAGNOSIS — Z23 Encounter for immunization: Secondary | ICD-10-CM | POA: Insufficient documentation

## 2019-05-21 NOTE — Progress Notes (Signed)
   Covid-19 Vaccination Clinic  Name:  Gary Gonzalez    MRN: [id number] DOB: [birth year]  05/21/2019  Gary Gonzalez was observed post Covid-19 immunization for 15 minutes without incidence. He was provided with Vaccine Information Sheet and instruction to access the V-Safe system.   Gary Gonzalez was instructed to call 911 with any severe reactions post vaccine: Marland Kitchen Difficulty breathing  . Swelling of your face and throat  . A fast heartbeat  . A bad rash all over your body  . Dizziness and weakness    Immunizations Administered    Name Date Dose VIS Date Route   Pfizer COVID-19 Vaccine 05/21/2019 11:56 AM 0.3 mL 04/10/2019 Intramuscular   Manufacturer: [place]   Lot: [id number]   [place]: [id number]

## 2019-06-11 ENCOUNTER — Ambulatory Visit: Payer: PPO | Attending: Internal Medicine

## 2019-06-11 DIAGNOSIS — Z23 Encounter for immunization: Secondary | ICD-10-CM | POA: Insufficient documentation

## 2019-06-11 NOTE — Progress Notes (Signed)
   Covid-19 Vaccination Clinic  Name:  Gary Gonzalez    MRN: [id number] DOB: 05/10/1926  06/11/2019  [name] was observed post Covid-19 immunization for 15 minutes without incidence. He was provided with Vaccine Information Sheet and instruction to access the V-Safe system.   [name] was instructed to call 911 with any severe reactions post vaccine: Marland Kitchen Difficulty breathing  . Swelling of your face and throat  . A fast heartbeat  . A bad rash all over your body  . Dizziness and weakness    Immunizations Administered    Name Date Dose VIS Date Route   Pfizer COVID-19 Vaccine 06/11/2019 11:24 AM 0.3 mL 04/10/2019 Intramuscular   Manufacturer: [place]   Lot: [id number]   [place]: [id number]

## 2019-06-12 DIAGNOSIS — Z961 Presence of intraocular lens: Secondary | ICD-10-CM | POA: Diagnosis not present

## 2019-07-25 DIAGNOSIS — R0789 Other chest pain: Secondary | ICD-10-CM | POA: Diagnosis not present

## 2019-07-25 DIAGNOSIS — J80 Acute respiratory distress syndrome: Secondary | ICD-10-CM | POA: Diagnosis not present

## 2019-07-25 DIAGNOSIS — R61 Generalized hyperhidrosis: Secondary | ICD-10-CM | POA: Diagnosis not present

## 2019-07-25 DIAGNOSIS — R079 Chest pain, unspecified: Secondary | ICD-10-CM | POA: Diagnosis not present

## 2019-07-27 ENCOUNTER — Telehealth: Payer: Self-pay | Admitting: Internal Medicine

## 2019-07-27 NOTE — Telephone Encounter (Signed)
Pt has f/u appt with EP APP on 07/30/19.  Await further needs.

## 2019-07-27 NOTE — Telephone Encounter (Signed)
Pt c/o of Chest Pain: STAT if CP now or developed within 24 hours  1. Are you having CP right now? yes  2. Are you experiencing any other symptoms (ex. SOB, nausea, vomiting, sweating)? no  3. How long have you been experiencing CP? Started Sat  4. Is your CP continuous or coming and going?  Coming and going  5. Have you taken Nitroglycerin? no ?

## 2019-07-27 NOTE — Telephone Encounter (Signed)
Spoke with pt and has noted CP off and on since Sat on Sat called 911 and was told EKG was fine nothing else was done Per  Pt chewed asa on Sat and this helped Pt again noted CP today and pain is at the base of rib cage no other symptoms Instructed pt to go to ED for eval and tx Pt verbalizes understanding Will forward message to Dr [name] for review .Adonis Housekeeper

## 2019-07-27 NOTE — Telephone Encounter (Signed)
Spoke with pt and pain subsided and did not go to ED Per pt if has another episode will go to ED Will let Dr [name] know .Adonis Housekeeper

## 2019-07-30 ENCOUNTER — Ambulatory Visit: Payer: PPO | Admitting: Student

## 2019-07-30 ENCOUNTER — Encounter: Payer: Self-pay | Admitting: Student

## 2019-07-30 ENCOUNTER — Other Ambulatory Visit: Payer: Self-pay

## 2019-07-30 VITALS — BP 124/64 | HR 86 | Ht 72.0 in | Wt 153.0 lb

## 2019-07-30 DIAGNOSIS — R0789 Other chest pain: Secondary | ICD-10-CM

## 2019-07-30 DIAGNOSIS — I2510 Atherosclerotic heart disease of native coronary artery without angina pectoris: Secondary | ICD-10-CM

## 2019-07-30 DIAGNOSIS — Z951 Presence of aortocoronary bypass graft: Secondary | ICD-10-CM | POA: Diagnosis not present

## 2019-07-30 DIAGNOSIS — I2583 Coronary atherosclerosis due to lipid rich plaque: Secondary | ICD-10-CM

## 2019-07-30 DIAGNOSIS — I493 Ventricular premature depolarization: Secondary | ICD-10-CM | POA: Diagnosis not present

## 2019-07-30 DIAGNOSIS — Z79899 Other long term (current) drug therapy: Secondary | ICD-10-CM | POA: Diagnosis not present

## 2019-07-30 LAB — CBC
Hematocrit: 32.6 % — ABNORMAL LOW (ref 37.5–51.0)
Hemoglobin: 10.8 g/dL — ABNORMAL LOW (ref 13.0–17.7)
MCH: 30.5 pg (ref 26.6–33.0)
MCHC: 33.1 g/dL (ref 31.5–35.7)
MCV: 92 fL (ref 79–97)
Platelets: 348 10*3/uL (ref 150–450)
RBC: 3.54 x10E6/uL — ABNORMAL LOW (ref 4.14–5.80)
RDW: 12.6 % (ref 11.6–15.4)
WBC: 8.1 10*3/uL (ref 3.4–10.8)

## 2019-07-30 LAB — COMPREHENSIVE METABOLIC PANEL
ALT: 272 IU/L — ABNORMAL HIGH (ref 0–44)
AST: 153 IU/L — ABNORMAL HIGH (ref 0–40)
Albumin/Globulin Ratio: 1.4 (ref 1.2–2.2)
Albumin: 3.9 g/dL (ref 3.5–4.6)
Alkaline Phosphatase: 224 IU/L — ABNORMAL HIGH (ref 39–117)
BUN/Creatinine Ratio: 16 (ref 10–24)
BUN: 23 mg/dL (ref 10–36)
Bilirubin Total: <0.2 mg/dL (ref 0.0–1.2)
CO2: 23 mmol/L (ref 20–29)
Calcium: 9.1 mg/dL (ref 8.6–10.2)
Chloride: 105 mmol/L (ref 96–106)
Creatinine, Ser: 1.43 mg/dL — ABNORMAL HIGH (ref 0.76–1.27)
GFR calc Af Amer: 49 mL/min/{1.73_m2} — ABNORMAL LOW (ref >59)
GFR calc non Af Amer: 43 mL/min/{1.73_m2} — ABNORMAL LOW (ref >59)
Globulin, Total: 2.7 g/dL (ref 1.5–4.5)
Glucose: 145 mg/dL — ABNORMAL HIGH (ref 65–99)
Potassium: 5 mmol/L (ref 3.5–5.2)
Sodium: 141 mmol/L (ref 134–144)
Total Protein: 6.6 g/dL (ref 6.0–8.5)

## 2019-07-30 LAB — TSH: TSH: 1.09 u[IU]/mL (ref 0.450–4.500)

## 2019-07-30 MED ORDER — NITROGLYCERIN 0.4 MG SL SUBL: 0.4000 mg | SUBLINGUAL_TABLET | SUBLINGUAL | 3 refills | Status: DC | PRN

## 2019-07-30 MED ORDER — ISOSORBIDE MONONITRATE ER 30 MG PO TB24: 30.0000 mg | ORAL_TABLET | Freq: Every day | ORAL | 3 refills | Status: DC

## 2019-07-30 NOTE — Progress Notes (Signed)
PCP:  [name], MD Primary Cardiologist: No primary care provider on file. Electrophysiologist: Dr. Vanita Ingles is a 86 y.o. male with past medical history of symptomatic PVCs and PVC induced CM who presents today for add on due to chest pain. They are seen for Dr. [name].   Since last being seen in our clinic, the patient reports OK.  He reports having CP on and off since this weekend at the base of his rib cage/sternum. It peaked after about 30 minutes then resolved. He called EMS. EKG was unremarkable. This was the most severe episode. He has had 7 more episodes since then, 3 Monday, and 2 Tuesday/wednesday.  None have been related to activity, food, or position. He does not have a prescription for NTG at home. No recent illness or trauma that he can think of. He does not recall ever having had this type of pain before. He does not recall ever having exertional chest pain before with his previous CABG and caths.   The patient feels that he is tolerating medications without difficulties and is otherwise without complaint today.   Past Medical History:  Diagnosis Date  . Anemia   . Arthritis    "touch in the fingers" (11/17/2014)  . Chronic combined systolic and diastolic CHF (congestive heart failure) ([place])    a. 11/17/14 showed EF 25-30%, suboptimal image quality, diffuse hypokinesis worse in the inferior wall, mild to moderate MR. New drop EF compared to 2012 but cath was stable.  . CKD (chronic kidney disease), stage III   . Coronary artery disease    a. s/p 2v CABG (seq LIMA to Diag and LAD, SVG to OM) on 05/15/2001.  . Fibromyalgia    "a long time ago" (11/17/2014)  . History of gout   . Hyperlipidemia   . Hypothyroidism   . LBBB (left bundle branch block)   . Melanoma of back ([place])   . NSVT (nonsustained ventricular tachycardia) ([place])   . PMR (polymyalgia rheumatica) ([facility])   . PVC's (premature ventricular contractions)   . Sleep apnea    "wife says I do" (11/18/2014)     Past Surgical History:  Procedure Laterality Date  . AXILLARY LYMPH NODE DISSECTION Bilateral 2001  . CARDIAC CATHETERIZATION  04/28/2001  . CARDIAC CATHETERIZATION N/A 11/19/2014   Procedure: Right/Left Heart Cath and Coronary/Graft Angiography;  Surgeon: Jettie Booze, MD;  Location: [facility];  Service: Cardiovascular;  Laterality: N/A;  . CATARACT EXTRACTION W/ INTRAOCULAR LENS  IMPLANT, BILATERAL Bilateral ~ 2010  . CORONARY ARTERY BYPASS GRAFT  05/15/2001   x3 sequential LIMA to diag and LAD, SVG to OM   . MELANOMA EXCISION  2001   off of back    Current Outpatient Medications  Medication Sig Dispense Refill  . amiodarone (PACERONE) 200 MG tablet TAKE ONE-HALF TABLET (100 MG TOTAL) BY MOUTH DAILY ON MONDAY THROUGH FRIDAY. DO NOT TAKE ON SATURDAY AND SUNDAY 15 tablet 11  . aspirin 81 MG tablet Take 81 mg by mouth daily.    Marland Kitchen ezetimibe (ZETIA) 10 MG tablet Take 10 mg by mouth daily.    Marland Kitchen levothyroxine (SYNTHROID, LEVOTHROID) 125 MCG tablet Take 1 tablet by mouth daily before breakfast.    . mirtazapine (REMERON) 7.5 MG tablet Take 7.5 mg by mouth at bedtime.     No current facility-administered medications for this visit.    Allergies  Allergen Reactions  . Atenolol Diarrhea and Other (See Comments)  Very weak, fatigued, lethargic, no appetite    Social History   Socioeconomic History  . Marital status: Married    Spouse name: Not on file  . Number of children: Not on file  . Years of education: Not on file  . Highest education level: Not on file  Occupational History  . Not on file  Tobacco Use  . Smoking status: Never Smoker  . Smokeless tobacco: Never Used  Substance and Sexual Activity  . Alcohol use: Yes    Alcohol/week: 6.0 standard drinks    Types: 3 Glasses of wine, 3 Cans of beer per week  . Drug use: No  . Sexual activity: Never  Other Topics Concern  . Not on file  Social History Narrative  . Not on file   Social Determinants of  Health   Financial Resource Strain:   . Difficulty of Paying Living Expenses:   Food Insecurity:   . Worried About Charity fundraiser in the Last Year:   . Arboriculturist in the Last Year:   Transportation Needs:   . Film/video editor (Medical):   Marland Kitchen Lack of Transportation (Non-Medical):   Physical Activity:   . Days of Exercise per Week:   . Minutes of Exercise per Session:   Stress:   . Feeling of Stress :   Social Connections:   . Frequency of Communication with Friends and Family:   . Frequency of Social Gatherings with Friends and Family:   . Attends Religious Services:   . Active Member of Clubs or Organizations:   . Attends Archivist Meetings:   Marland Kitchen Marital Status:   Intimate Partner Violence:   . Fear of Current or Ex-Partner:   . Emotionally Abused:   Marland Kitchen Physically Abused:   . Sexually Abused:      Review of Systems: General: No chills, fever, night sweats or weight changes  Cardiovascular:  No chest pain, dyspnea on exertion, edema, orthopnea, palpitations, paroxysmal nocturnal dyspnea Dermatological: No rash, lesions or masses Respiratory: No cough, dyspnea Urologic: No hematuria, dysuria Abdominal: No nausea, vomiting, diarrhea, bright red blood per rectum, melena, or hematemesis Neurologic: No visual changes, weakness, changes in mental status All other systems reviewed and are otherwise negative except as noted above.  Physical Exam: Vitals:   07/30/19 0913  BP: 124/64  Pulse: 86  SpO2: 98%  Weight: 153 lb (69.4 kg)  Height: 6' (1.829 m)    GEN- The patient is well appearing, alert and oriented x 3 today.   HEENT: normocephalic, atraumatic; sclera clear, conjunctiva pink; hearing intact; oropharynx clear; neck supple, no JVP Lymph- no cervical lymphadenopathy Lungs- Clear to ausculation bilaterally, normal work of breathing.  No wheezes, rales, rhonchi Heart- Regular rate and rhythm, no murmurs, rubs or gallops, PMI not laterally  displaced GI- soft, non-tender, non-distended, bowel sounds present, no hepatosplenomegaly Extremities- no clubbing, cyanosis, or edema; DP/PT/radial pulses 2+ bilaterally MS- no significant deformity or atrophy Skin- warm and dry, no rash or lesion Psych- euthymic mood, full affect Neuro- strength and sensation are intact  EKG is ordered. Personal review of EKG from today shows NSR at 86 bpm with LBBB, chronic.   Assessment and Plan:  1. CAD s/p CABG Now with atypical, non-exertional chest pain His chest pain is not related to exertion, food, or position. He has had several episodes since this weekend that seem to be improving in frequency and intensity CABG 2003, patent grafts 2016. No exertional chest pain.  Not on NTG. Discussed with Dr. Johnsie Cancel who is DOD. In this 86 year old patient with atypical chest pain would pursue conservative therapy.  Will plan on starting imdur 30 mg daily and also give rx for sublingual NTG.  If his symptoms do not improve, or worsen, he knows to call back.  He has not seen Dr. [name] in years, so will also put in for follow up for his CAD/HTN.   2. PVC induced CM Echo 03/2015 showed normalization of EF. Echo 10/2016 with poor windows for assessment.  Continue amiodarone 1 g weekly (M-F) Surveillance labs today.   3. PVCs Stable  If stable, RTC to see EP annually.   Shirley Friar, PA-C  07/30/19 9:37 AM

## 2019-07-30 NOTE — Patient Instructions (Signed)
Medication Instructions:  START IMDUR 30 mg DAILY START Nitroglycerin 0.4 mg AS NEEDED FOR CHEST PAIN (Place under tongue) *If you need a refill on your cardiac medications before your next appointment, please call your pharmacy*   Lab Work:  TODAY TSH CBC CMET If you have labs (blood work) drawn today and your tests are completely normal, you will receive your results only by: Marland Kitchen MyChart Message (if you have MyChart) OR . A paper copy in the mail If you have any lab test that is abnormal or we need to change your treatment, we will call you to review the results.   Testing/Procedures: none   Follow-Up: At [facility], you and your health needs are our priority.  As part of our continuing mission to provide you with exceptional heart care, we have created designated Provider Care Teams.  These Care Teams include your primary Cardiologist (physician) and Advanced Practice Providers (APPs -  Physician Assistants and Nurse Practitioners) who all work together to provide you with the care you need, when you need it.  Your next appointment:   1 year(s)  The format for your next appointment:   Either In Person or Virtual  Provider:   Dr [name]   Other Instructions  3 months with Dr [name] (PLEASE SCHEDULE)    Isosorbide Mononitrate extended-release tablets What is this medicine? ISOSORBIDE MONONITRATE (eye soe SOR bide mon oh NYE trate) is a vasodilator. It relaxes blood vessels, increasing the blood and oxygen supply to your heart. This medicine is used to prevent chest pain caused by angina. It will not help to stop an episode of chest pain. This medicine may be used for other purposes; ask your health care provider or pharmacist if you have questions. COMMON BRAND NAME(S): Imdur, Isotrate ER What should I tell my health care provider before I take this medicine? They need to know if you have any of these conditions:  previous heart attack or heart failure  an unusual  or allergic reaction to isosorbide mononitrate, nitrates, other medicines, foods, dyes, or preservatives  pregnant or trying to get pregnant  breast-feeding How should I use this medicine? Take this medicine by mouth with a glass of water. Follow the directions on the prescription label. Do not crush or chew. Take your medicine at regular intervals. Do not take your medicine more often than directed. Do not stop taking this medicine except on the advice of your doctor or health care professional. Talk to your pediatrician regarding the use of this medicine in children. Special care may be needed. Overdosage: If you think you have taken too much of this medicine contact a poison control center or emergency room at once. NOTE: This medicine is only for you. Do not share this medicine with others. What if I miss a dose? If you miss a dose, take it as soon as you can. If it is almost time for your next dose, take only that dose. Do not take double or extra doses. What may interact with this medicine? Do not take this medicine with any of the following medications:  medicines used to treat erectile dysfunction (ED) like avanafil, sildenafil, tadalafil, and vardenafil  riociguat This medicine may also interact with the following medications:  medicines for high blood pressure  other medicines for angina or heart failure This list may not describe all possible interactions. Give your health care provider a list of all the medicines, herbs, non-prescription drugs, or dietary supplements you use. Also tell them  if you smoke, drink alcohol, or use illegal drugs. Some items may interact with your medicine. What should I watch for while using this medicine? Check your heart rate and blood pressure regularly while you are taking this medicine. Ask your doctor or health care professional what your heart rate and blood pressure should be and when you should contact him or her. Tell your doctor or health  care professional if you feel your medicine is no longer working. You may get dizzy. Do not drive, use machinery, or do anything that needs mental alertness until you know how this medicine affects you. To reduce the risk of dizzy or fainting spells, do not sit or stand up quickly, especially if you are an older patient. Alcohol can make you more dizzy, and increase flushing and rapid heartbeats. Avoid alcoholic drinks. Do not treat yourself for coughs, colds, or pain while you are taking this medicine without asking your doctor or health care professional for advice. Some ingredients may increase your blood pressure. What side effects may I notice from receiving this medicine? Side effects that you should report to your doctor or health care professional as soon as possible:  bluish discoloration of lips, fingernails, or palms of hands  irregular heartbeat, palpitations  low blood pressure  nausea, vomiting  persistent headache  unusually weak or tired Side effects that usually do not require medical attention (report to your doctor or health care professional if they continue or are bothersome):  flushing of the face or neck  rash This list may not describe all possible side effects. Call your doctor for medical advice about side effects. You may report side effects to FDA at 1-800-FDA-1088. Where should I keep my medicine? Keep out of the reach of children. Store between 15 and 30 degrees C (59 and 86 degrees F). Keep container tightly closed. Throw away any unused medicine after the expiration date. NOTE: This sheet is a summary. It may not cover all possible information. If you have questions about this medicine, talk to your doctor, pharmacist, or health care provider.  2020 Elsevier/Gold Standard (2013-02-13 14:48:19)

## 2019-07-31 ENCOUNTER — Telehealth: Payer: Self-pay

## 2019-07-31 DIAGNOSIS — Z79899 Other long term (current) drug therapy: Secondary | ICD-10-CM

## 2019-07-31 DIAGNOSIS — I2583 Coronary atherosclerosis due to lipid rich plaque: Secondary | ICD-10-CM

## 2019-07-31 DIAGNOSIS — I493 Ventricular premature depolarization: Secondary | ICD-10-CM

## 2019-07-31 DIAGNOSIS — I2510 Atherosclerotic heart disease of native coronary artery without angina pectoris: Secondary | ICD-10-CM

## 2019-07-31 NOTE — Telephone Encounter (Signed)
-----   Message from Shirley Friar, PA-C sent at 07/31/2019  9:38 AM EDT ----- TSH WNL, CBC at baseline.   Discussed elevated LFTs with Dr. [name]. Somewhat higher than would expect with amio toxicity, but at this time would STOP amiodarone. Will need repeat CMET in 6 weeks. If LFTs remain elevated at that point, will need to follow up with PCP for further.   Thank you!

## 2019-07-31 NOTE — Telephone Encounter (Signed)
The patient has been notified of the Lab result and verbalized understanding.  All questions (if any) were answered. Frederik Schmidt, RN 07/31/2019 9:49 AM

## 2019-09-11 ENCOUNTER — Other Ambulatory Visit: Payer: Self-pay

## 2019-09-11 ENCOUNTER — Other Ambulatory Visit: Payer: PPO | Admitting: *Deleted

## 2019-09-11 DIAGNOSIS — Z79899 Other long term (current) drug therapy: Secondary | ICD-10-CM

## 2019-09-11 DIAGNOSIS — I2510 Atherosclerotic heart disease of native coronary artery without angina pectoris: Secondary | ICD-10-CM

## 2019-09-11 DIAGNOSIS — I493 Ventricular premature depolarization: Secondary | ICD-10-CM

## 2019-09-11 DIAGNOSIS — I2583 Coronary atherosclerosis due to lipid rich plaque: Secondary | ICD-10-CM | POA: Diagnosis not present

## 2019-09-11 LAB — COMPREHENSIVE METABOLIC PANEL
ALT: 16 IU/L (ref 0–44)
AST: 18 IU/L (ref 0–40)
Albumin/Globulin Ratio: 1.5 (ref 1.2–2.2)
Albumin: 4 g/dL (ref 3.5–4.6)
Alkaline Phosphatase: 84 IU/L (ref 39–117)
BUN/Creatinine Ratio: 17 (ref 10–24)
BUN: 22 mg/dL (ref 10–36)
Bilirubin Total: <0.2 mg/dL (ref 0.0–1.2)
CO2: 22 mmol/L (ref 20–29)
Calcium: 9.1 mg/dL (ref 8.6–10.2)
Chloride: 107 mmol/L — ABNORMAL HIGH (ref 96–106)
Creatinine, Ser: 1.3 mg/dL — ABNORMAL HIGH (ref 0.76–1.27)
GFR calc Af Amer: 56 mL/min/{1.73_m2} — ABNORMAL LOW (ref >59)
GFR calc non Af Amer: 48 mL/min/{1.73_m2} — ABNORMAL LOW (ref >59)
Globulin, Total: 2.6 g/dL (ref 1.5–4.5)
Glucose: 99 mg/dL (ref 65–99)
Potassium: 5.3 mmol/L — ABNORMAL HIGH (ref 3.5–5.2)
Sodium: 142 mmol/L (ref 134–144)
Total Protein: 6.6 g/dL (ref 6.0–8.5)

## 2019-09-14 ENCOUNTER — Telehealth: Payer: Self-pay

## 2019-09-14 NOTE — Telephone Encounter (Signed)
I spoke to the patient with Gary Gonzalez's advisement.  He verbalized understanding.

## 2019-09-14 NOTE — Telephone Encounter (Signed)
The patient has been notified of the result and verbalized understanding.  All questions (if any) were answered. Frederik Schmidt, RN 09/14/2019 9:18 AM    Recommendations were given and the patient wants to know if he should resume Amiodarone?  Please advise, thank you

## 2019-09-14 NOTE — Telephone Encounter (Signed)
-----   Message from Nuala Alpha, LPN sent at [id number]  8:11 AM EDT -----  ----- Message ----- From: Shirley Friar, Hershal Coria Sent: 09/11/2019   7:34 PM EDT To: [facility] Triage  Please make sure he is not on any potassium supplementation; should also avoid multi vitamins with high potassium content.  Please recommend low potassium diet, should limit bananas, tomatoes (especially tomato PASTE), avocados. Strawberries, etc.  Would limit these things to 1-2 times a week at most.

## 2019-09-14 NOTE — Telephone Encounter (Signed)
Follow up  ° ° °Pt returning call  °

## 2019-09-14 NOTE — Telephone Encounter (Signed)
[id number] 5/17

## 2019-09-14 NOTE — Telephone Encounter (Signed)
His LFT elevation has resolved off of amiodarone, so would leave off at this time.   Thank you! Legrand Como [address] Tripp, PA-C  09/14/2019 9:31 AM

## 2019-09-14 NOTE — Telephone Encounter (Signed)
The patient has been notified of the result and verbalized understanding.  All questions (if any) were answered. Frederik Schmidt, RN 09/14/2019 9:20 AM

## 2019-11-18 DIAGNOSIS — E7849 Other hyperlipidemia: Secondary | ICD-10-CM | POA: Diagnosis not present

## 2019-11-18 DIAGNOSIS — M109 Gout, unspecified: Secondary | ICD-10-CM | POA: Diagnosis not present

## 2019-11-18 DIAGNOSIS — E038 Other specified hypothyroidism: Secondary | ICD-10-CM | POA: Diagnosis not present

## 2019-11-18 DIAGNOSIS — R7301 Impaired fasting glucose: Secondary | ICD-10-CM | POA: Diagnosis not present

## 2019-11-18 DIAGNOSIS — Z125 Encounter for screening for malignant neoplasm of prostate: Secondary | ICD-10-CM | POA: Diagnosis not present

## 2019-11-23 DIAGNOSIS — L570 Actinic keratosis: Secondary | ICD-10-CM | POA: Diagnosis not present

## 2019-11-23 DIAGNOSIS — L080 Pyoderma: Secondary | ICD-10-CM | POA: Diagnosis not present

## 2019-11-23 DIAGNOSIS — L738 Other specified follicular disorders: Secondary | ICD-10-CM | POA: Diagnosis not present

## 2019-11-23 DIAGNOSIS — D0361 Melanoma in situ of right upper limb, including shoulder: Secondary | ICD-10-CM | POA: Diagnosis not present

## 2019-11-23 DIAGNOSIS — L821 Other seborrheic keratosis: Secondary | ICD-10-CM | POA: Diagnosis not present

## 2019-11-23 DIAGNOSIS — Z85820 Personal history of malignant melanoma of skin: Secondary | ICD-10-CM | POA: Diagnosis not present

## 2019-11-23 DIAGNOSIS — D1801 Hemangioma of skin and subcutaneous tissue: Secondary | ICD-10-CM | POA: Diagnosis not present

## 2019-11-23 DIAGNOSIS — Z85828 Personal history of other malignant neoplasm of skin: Secondary | ICD-10-CM | POA: Diagnosis not present

## 2019-11-23 DIAGNOSIS — D485 Neoplasm of uncertain behavior of skin: Secondary | ICD-10-CM | POA: Diagnosis not present

## 2019-11-25 DIAGNOSIS — E291 Testicular hypofunction: Secondary | ICD-10-CM | POA: Diagnosis not present

## 2019-11-25 DIAGNOSIS — R634 Abnormal weight loss: Secondary | ICD-10-CM | POA: Diagnosis not present

## 2019-11-25 DIAGNOSIS — H9190 Unspecified hearing loss, unspecified ear: Secondary | ICD-10-CM | POA: Diagnosis not present

## 2019-11-25 DIAGNOSIS — C439 Malignant melanoma of skin, unspecified: Secondary | ICD-10-CM | POA: Diagnosis not present

## 2019-11-25 DIAGNOSIS — I2510 Atherosclerotic heart disease of native coronary artery without angina pectoris: Secondary | ICD-10-CM | POA: Diagnosis not present

## 2019-11-25 DIAGNOSIS — Z1331 Encounter for screening for depression: Secondary | ICD-10-CM | POA: Diagnosis not present

## 2019-11-25 DIAGNOSIS — N1831 Chronic kidney disease, stage 3a: Secondary | ICD-10-CM | POA: Diagnosis not present

## 2019-11-25 DIAGNOSIS — B029 Zoster without complications: Secondary | ICD-10-CM | POA: Diagnosis not present

## 2019-11-25 DIAGNOSIS — D649 Anemia, unspecified: Secondary | ICD-10-CM | POA: Diagnosis not present

## 2019-11-25 DIAGNOSIS — I509 Heart failure, unspecified: Secondary | ICD-10-CM | POA: Diagnosis not present

## 2019-11-25 DIAGNOSIS — R82998 Other abnormal findings in urine: Secondary | ICD-10-CM | POA: Diagnosis not present

## 2019-11-25 DIAGNOSIS — Z0000 Encounter for general adult medical examination without abnormal findings: Secondary | ICD-10-CM | POA: Diagnosis not present

## 2019-11-25 DIAGNOSIS — G629 Polyneuropathy, unspecified: Secondary | ICD-10-CM | POA: Diagnosis not present

## 2019-11-25 DIAGNOSIS — I493 Ventricular premature depolarization: Secondary | ICD-10-CM | POA: Diagnosis not present

## 2019-12-03 DIAGNOSIS — D0361 Melanoma in situ of right upper limb, including shoulder: Secondary | ICD-10-CM | POA: Diagnosis not present

## 2019-12-03 DIAGNOSIS — Z85828 Personal history of other malignant neoplasm of skin: Secondary | ICD-10-CM | POA: Diagnosis not present

## 2019-12-03 DIAGNOSIS — Z85820 Personal history of malignant melanoma of skin: Secondary | ICD-10-CM | POA: Diagnosis not present

## 2019-12-03 DIAGNOSIS — L821 Other seborrheic keratosis: Secondary | ICD-10-CM | POA: Diagnosis not present

## 2020-01-05 ENCOUNTER — Ambulatory Visit: Payer: PPO | Attending: Internal Medicine

## 2020-01-05 ENCOUNTER — Ambulatory Visit: Payer: Self-pay

## 2020-01-05 DIAGNOSIS — Z23 Encounter for immunization: Secondary | ICD-10-CM

## 2020-01-05 NOTE — Progress Notes (Signed)
   Covid-19 Vaccination Clinic  Name:  Gary Gonzalez    MRN: [id number] DOB: 19-Mar-1927  01/05/2020  [name] was observed post Covid-19 immunization for 15 minutes without incident. He was provided with Vaccine Information Sheet and instruction to access the V-Safe system.   [name] was instructed to call 911 with any severe reactions post vaccine: Marland Kitchen Difficulty breathing  . Swelling of face and throat  . A fast heartbeat  . A bad rash all over body  . Dizziness and weakness

## 2020-02-13 DIAGNOSIS — Z23 Encounter for immunization: Secondary | ICD-10-CM | POA: Diagnosis not present

## 2020-02-15 DIAGNOSIS — Z79899 Other long term (current) drug therapy: Secondary | ICD-10-CM | POA: Diagnosis not present

## 2020-02-15 DIAGNOSIS — K219 Gastro-esophageal reflux disease without esophagitis: Secondary | ICD-10-CM | POA: Diagnosis not present

## 2020-02-15 DIAGNOSIS — R1013 Epigastric pain: Secondary | ICD-10-CM | POA: Diagnosis not present

## 2020-02-17 DIAGNOSIS — K829 Disease of gallbladder, unspecified: Secondary | ICD-10-CM | POA: Diagnosis not present

## 2020-02-26 ENCOUNTER — Other Ambulatory Visit: Payer: Self-pay | Admitting: Internal Medicine

## 2020-02-26 ENCOUNTER — Ambulatory Visit
Admission: RE | Admit: 2020-02-26 | Discharge: 2020-02-26 | Disposition: A | Discharge status: Home / Self Care | Payer: PPO | Source: Ambulatory Visit | Attending: Internal Medicine | Admitting: Internal Medicine

## 2020-02-26 DIAGNOSIS — K8020 Calculus of gallbladder without cholecystitis without obstruction: Secondary | ICD-10-CM

## 2020-02-26 DIAGNOSIS — R1011 Right upper quadrant pain: Secondary | ICD-10-CM | POA: Diagnosis not present

## 2020-02-26 MED ORDER — IOPAMIDOL (ISOVUE-300) INJECTION 61%
100.0000 mL | Freq: Once | INTRAVENOUS | Status: AC | PRN
  Administered 2020-02-26: 80 mL via INTRAVENOUS

## 2020-03-30 ENCOUNTER — Telehealth: Payer: Self-pay | Admitting: *Deleted

## 2020-03-30 DIAGNOSIS — K8050 Calculus of bile duct without cholangitis or cholecystitis without obstruction: Secondary | ICD-10-CM | POA: Diagnosis not present

## 2020-03-30 NOTE — Telephone Encounter (Signed)
   [facility] Pre-operative Risk Assessment    HEARTCARE STAFF: - Please ensure there is not already an duplicate clearance open for this procedure. - Under Visit Info/Reason for Call, type in Other and utilize the format Clearance MM/DD/YY or Clearance TBD. Do not use dashes or single digits. - If request is for dental extraction, please clarify the # of teeth to be extracted.  Request for surgical clearance:  1. What type of surgery is being performed? LAP CHOLE   2. When is this surgery scheduled? TBD   3. What type of clearance is required (medical clearance vs. Pharmacy clearance to hold med vs. Both)? MEDICAL  4. Are there any medications that need to be held prior to surgery and how long? ASA   5. Practice name and name of physician performing surgery? [facility]; DR. Wilburn Cornelia ALLEN   6. What is the office phone number? [phone]   7.   What is the office fax number? [phone] ATTN: JACQUELINE HAGGETT, RMA  8.   Anesthesia type (None, local, MAC, general) ? GENERAL   Julaine Hua 03/30/2020, 4:49 PM  _________________________________________________________________   (provider comments below)

## 2020-03-31 NOTE — Telephone Encounter (Signed)
   Primary Cardiologist: Peter [name], MD  Chart reviewed as part of pre-operative protocol coverage. Because of Geron Mulford Stach's past medical history and time since last visit, he will require a follow-up visit in order to better assess preoperative cardiovascular risk.  Pre-op covering staff: - Please schedule appointment and call patient to inform them. If patient already had an upcoming appointment within acceptable timeframe, please add "pre-op clearance" to the appointment notes so provider is aware. - Please contact requesting surgeon's office via preferred method (i.e, phone, fax) to inform them of need for appointment prior to surgery.  If applicable, this message will also be routed to pharmacy pool and/or primary cardiologist for input on holding anticoagulant/antiplatelet agent as requested below so that this information is available to the clearing provider at time of patient's appointment.   Pt reported nonexertional chest pain at last appt.  Tami Lin Kristyn Obyrne, PA  03/31/2020, 10:41 AM

## 2020-03-31 NOTE — Telephone Encounter (Signed)
Left message for patient to call to schedule appointment for pre op clearance 

## 2020-04-01 NOTE — Telephone Encounter (Signed)
Patient returned call. He states he is not aware of any surgery planned and will call [facility] to find out more information.

## 2020-04-26 ENCOUNTER — Other Ambulatory Visit: Payer: Self-pay

## 2020-04-26 ENCOUNTER — Ambulatory Visit: Payer: PPO | Admitting: Internal Medicine

## 2020-04-26 ENCOUNTER — Encounter: Payer: Self-pay | Admitting: Internal Medicine

## 2020-04-26 VITALS — BP 120/60 | HR 106 | Ht 72.0 in | Wt 146.2 lb

## 2020-04-26 DIAGNOSIS — Z951 Presence of aortocoronary bypass graft: Secondary | ICD-10-CM

## 2020-04-26 DIAGNOSIS — I2510 Atherosclerotic heart disease of native coronary artery without angina pectoris: Secondary | ICD-10-CM | POA: Diagnosis not present

## 2020-04-26 DIAGNOSIS — I2583 Coronary atherosclerosis due to lipid rich plaque: Secondary | ICD-10-CM | POA: Diagnosis not present

## 2020-04-26 DIAGNOSIS — I493 Ventricular premature depolarization: Secondary | ICD-10-CM

## 2020-04-26 NOTE — Patient Instructions (Addendum)
Medication Instructions:  Your physician recommends that you continue on your current medications as directed. Please refer to the Current Medication list given to you today.  Labwork: None ordered.  Testing/Procedures: None ordered.  Follow-Up: Your physician wants you to follow-up in: 6 months with Dr. Taylor.      Any Other Special Instructions Will Be Listed Below (If Applicable).  If you need a refill on your cardiac medications before your next appointment, please call your pharmacy.   

## 2020-04-26 NOTE — Progress Notes (Signed)
HPI [name] returns for followup of his symptomatic PVC's and PVC induced CM. He has tolerated low dose amiodarone and appears to not have had much in the way of PVC's. He has chronic fatigue and weakness which may be in part due to the amiodarone. However, off the amiodarone he has symptomatic PVC's. No syncope but he does have some falls. He remains off of the amiodarone.  He presents today for preoperative evaluation for possible gallbladder surgery. Allergies  Allergen Reactions  . Atenolol Diarrhea and Other (See Comments)    Very weak, fatigued, lethargic, no appetite     Current Outpatient Medications  Medication Sig Dispense Refill  . aspirin 81 MG tablet Take 81 mg by mouth daily.    Marland Kitchen. ezetimibe (ZETIA) 10 MG tablet Take 10 mg by mouth daily.    Marland Kitchen. levothyroxine (SYNTHROID, LEVOTHROID) 125 MCG tablet Take 1 tablet by mouth daily before breakfast.    . mirtazapine (REMERON) 7.5 MG tablet Take 7.5 mg by mouth at bedtime.    . isosorbide mononitrate (IMDUR) 30 MG 24 hr tablet Take 1 tablet (30 mg total) by mouth daily. 90 tablet 3  . nitroGLYCERIN (NITROSTAT) 0.4 MG SL tablet Place 1 tablet (0.4 mg total) under the tongue every 5 (five) minutes as needed for chest pain. 25 tablet 3   No current facility-administered medications for this visit.     Past Medical History:  Diagnosis Date  . Anemia   . Arthritis    "touch in the fingers" (11/17/2014)  . Chronic combined systolic and diastolic CHF (congestive heart failure) (HCC)    a. 11/17/14 showed EF 25-30%, suboptimal image quality, diffuse hypokinesis worse in the inferior wall, mild to moderate MR. New drop EF compared to 2012 but cath was stable.  . CKD (chronic kidney disease), stage III (HCC)   . Coronary artery disease    a. s/p 2v CABG (seq LIMA to Diag and LAD, SVG to OM) on 05/15/2001.  . Fibromyalgia    "a long time ago" (11/17/2014)  . History of gout   . Hyperlipidemia   . Hypothyroidism   . LBBB (left  bundle branch block)   . Melanoma of back (HCC)   . NSVT (nonsustained ventricular tachycardia) (HCC)   . PMR (polymyalgia rheumatica) (HCC)   . PVC's (premature ventricular contractions)   . Sleep apnea    "wife says I do" (11/18/2014)    ROS:   All systems reviewed and negative except as noted in the HPI.   Past Surgical History:  Procedure Laterality Date  . AXILLARY LYMPH NODE DISSECTION Bilateral 2001  . CARDIAC CATHETERIZATION  04/28/2001  . CARDIAC CATHETERIZATION N/A 11/19/2014   Procedure: Right/Left Heart Cath and Coronary/Graft Angiography;  Surgeon: Corky CraftsJayadeep S Varanasi, MD;  [facility];  Service: Cardiovascular;  Laterality: N/A;  . CATARACT EXTRACTION W/ INTRAOCULAR LENS  IMPLANT, BILATERAL Bilateral ~ 2010  . CORONARY ARTERY BYPASS GRAFT  05/15/2001   x3 sequential LIMA to diag and LAD, SVG to OM   . MELANOMA EXCISION  2001   off of back     Family History  Problem Relation Age of Onset  . Prostate cancer Father   . Liver cancer Mother   . Hyperlipidemia Son   . Hyperlipidemia Daughter      Social History   Socioeconomic History  . Marital status: Married    Spouse name: Not on file  . Number of children: Not on file  .  Years of education: Not on file  . Highest education level: Not on file  Occupational History  . Not on file  Tobacco Use  . Smoking status: Never Smoker  . Smokeless tobacco: Never Used  Substance and Sexual Activity  . Alcohol use: Yes    Alcohol/week: 6.0 standard drinks    Types: 3 Glasses of wine, 3 Cans of beer per week  . Drug use: No  . Sexual activity: Never  Other Topics Concern  . Not on file  Social History Narrative  . Not on file   Social Determinants of Health   Financial Resource Strain: Not on file  Food Insecurity: Not on file  Transportation Needs: Not on file  Physical Activity: Not on file  Stress: Not on file  Social Connections: Not on file  Intimate Partner Violence: Not on file      BP 120/60   Pulse (!) 106   Ht 6' (1.829 m)   Wt 146 lb 3.2 oz (66.3 kg)   SpO2 99%   BMI 19.83 kg/m   Physical Exam:  Well appearing elderly man, NAD HEENT: Unremarkable Neck:  No JVD, no thyromegally Lymphatics:  No adenopathy Back:  No CVA tenderness Lungs:  Clear, with no wheezes, rales, or rhonchi HEART:  Regular rate rhythm, no murmurs, no rubs, no clicks Abd:  soft, positive bowel sounds, no organomegally, no rebound, no guarding Ext:  2 plus pulses, no edema, no cyanosis, no clubbing Skin:  No rashes no nodules Neuro:  CN II through XII intact, motor grossly intact  EKG -normal sinus rhythm, with an intraventricular conduction delay  DEVICE  Normal device function.  See PaceArt for details.   Assess/Plan: 1. PVC - he is stable and has not had any additional symptoms. He will continue his low dose amiodarone. 2. PVC induced CM - his EF had normalized at last echo. He will continue his current meds. 3.  Coronary artery disease -despite his advanced age, he has no anginal symptoms. 4.  Preoperative evaluation -the patient is pending possible gallbladder surgery and an abdominal exploration.  His surgical risk is acceptable.  No additional testing would be indicated at this time.  Sharrell Ku, MD

## 2020-05-03 DIAGNOSIS — I509 Heart failure, unspecified: Secondary | ICD-10-CM | POA: Diagnosis not present

## 2020-05-03 DIAGNOSIS — E039 Hypothyroidism, unspecified: Secondary | ICD-10-CM | POA: Diagnosis not present

## 2020-05-03 DIAGNOSIS — E785 Hyperlipidemia, unspecified: Secondary | ICD-10-CM | POA: Diagnosis not present

## 2020-05-03 DIAGNOSIS — I493 Ventricular premature depolarization: Secondary | ICD-10-CM | POA: Diagnosis not present

## 2020-05-03 DIAGNOSIS — K8020 Calculus of gallbladder without cholecystitis without obstruction: Secondary | ICD-10-CM | POA: Diagnosis not present

## 2020-05-03 DIAGNOSIS — R269 Unspecified abnormalities of gait and mobility: Secondary | ICD-10-CM | POA: Diagnosis not present

## 2020-05-03 DIAGNOSIS — I2510 Atherosclerotic heart disease of native coronary artery without angina pectoris: Secondary | ICD-10-CM | POA: Diagnosis not present

## 2020-05-03 DIAGNOSIS — H6123 Impacted cerumen, bilateral: Secondary | ICD-10-CM | POA: Diagnosis not present

## 2020-05-03 DIAGNOSIS — E875 Hyperkalemia: Secondary | ICD-10-CM | POA: Diagnosis not present

## 2020-05-03 DIAGNOSIS — D649 Anemia, unspecified: Secondary | ICD-10-CM | POA: Diagnosis not present

## 2020-05-03 DIAGNOSIS — G629 Polyneuropathy, unspecified: Secondary | ICD-10-CM | POA: Diagnosis not present

## 2020-05-08 ENCOUNTER — Inpatient Hospital Stay (HOSPITAL_COMMUNITY)
Admission: EM | Admit: 2020-05-08 | Discharge: 2020-05-12 | DRG: 417 | Disposition: A | Discharge status: Home / Self Care | Payer: PPO | Attending: Surgery | Admitting: Surgery

## 2020-05-08 ENCOUNTER — Emergency Department (HOSPITAL_COMMUNITY): Payer: PPO

## 2020-05-08 ENCOUNTER — Encounter (HOSPITAL_COMMUNITY): Payer: Self-pay

## 2020-05-08 ENCOUNTER — Other Ambulatory Visit: Payer: Self-pay

## 2020-05-08 DIAGNOSIS — M19049 Primary osteoarthritis, unspecified hand: Secondary | ICD-10-CM | POA: Diagnosis not present

## 2020-05-08 DIAGNOSIS — Z419 Encounter for procedure for purposes other than remedying health state, unspecified: Secondary | ICD-10-CM

## 2020-05-08 DIAGNOSIS — I447 Left bundle-branch block, unspecified: Secondary | ICD-10-CM | POA: Diagnosis not present

## 2020-05-08 DIAGNOSIS — I2510 Atherosclerotic heart disease of native coronary artery without angina pectoris: Secondary | ICD-10-CM | POA: Diagnosis present

## 2020-05-08 DIAGNOSIS — Z961 Presence of intraocular lens: Secondary | ICD-10-CM | POA: Diagnosis present

## 2020-05-08 DIAGNOSIS — K810 Acute cholecystitis: Secondary | ICD-10-CM | POA: Diagnosis present

## 2020-05-08 DIAGNOSIS — K8020 Calculus of gallbladder without cholecystitis without obstruction: Secondary | ICD-10-CM | POA: Diagnosis not present

## 2020-05-08 DIAGNOSIS — Z7982 Long term (current) use of aspirin: Secondary | ICD-10-CM | POA: Diagnosis not present

## 2020-05-08 DIAGNOSIS — Z79890 Hormone replacement therapy (postmenopausal): Secondary | ICD-10-CM

## 2020-05-08 DIAGNOSIS — R7401 Elevation of levels of liver transaminase levels: Secondary | ICD-10-CM | POA: Diagnosis not present

## 2020-05-08 DIAGNOSIS — I5042 Chronic combined systolic (congestive) and diastolic (congestive) heart failure: Secondary | ICD-10-CM | POA: Diagnosis present

## 2020-05-08 DIAGNOSIS — K8042 Calculus of bile duct with acute cholecystitis without obstruction: Secondary | ICD-10-CM | POA: Diagnosis not present

## 2020-05-08 DIAGNOSIS — Z951 Presence of aortocoronary bypass graft: Secondary | ICD-10-CM | POA: Diagnosis not present

## 2020-05-08 DIAGNOSIS — K838 Other specified diseases of biliary tract: Secondary | ICD-10-CM

## 2020-05-08 DIAGNOSIS — E785 Hyperlipidemia, unspecified: Secondary | ICD-10-CM | POA: Diagnosis not present

## 2020-05-08 DIAGNOSIS — M353 Polymyalgia rheumatica: Secondary | ICD-10-CM | POA: Diagnosis present

## 2020-05-08 DIAGNOSIS — I429 Cardiomyopathy, unspecified: Secondary | ICD-10-CM | POA: Diagnosis not present

## 2020-05-08 DIAGNOSIS — E039 Hypothyroidism, unspecified: Secondary | ICD-10-CM | POA: Diagnosis not present

## 2020-05-08 DIAGNOSIS — R109 Unspecified abdominal pain: Secondary | ICD-10-CM | POA: Diagnosis not present

## 2020-05-08 DIAGNOSIS — Z743 Need for continuous supervision: Secondary | ICD-10-CM | POA: Diagnosis not present

## 2020-05-08 DIAGNOSIS — I493 Ventricular premature depolarization: Secondary | ICD-10-CM | POA: Diagnosis not present

## 2020-05-08 DIAGNOSIS — N281 Cyst of kidney, acquired: Secondary | ICD-10-CM | POA: Diagnosis not present

## 2020-05-08 DIAGNOSIS — Z79899 Other long term (current) drug therapy: Secondary | ICD-10-CM | POA: Diagnosis not present

## 2020-05-08 DIAGNOSIS — K8062 Calculus of gallbladder and bile duct with acute cholecystitis without obstruction: Secondary | ICD-10-CM | POA: Diagnosis not present

## 2020-05-08 DIAGNOSIS — K851 Biliary acute pancreatitis without necrosis or infection: Secondary | ICD-10-CM | POA: Diagnosis not present

## 2020-05-08 DIAGNOSIS — R1013 Epigastric pain: Secondary | ICD-10-CM | POA: Diagnosis not present

## 2020-05-08 DIAGNOSIS — R932 Abnormal findings on diagnostic imaging of liver and biliary tract: Secondary | ICD-10-CM | POA: Diagnosis not present

## 2020-05-08 DIAGNOSIS — Z20822 Contact with and (suspected) exposure to covid-19: Secondary | ICD-10-CM | POA: Diagnosis not present

## 2020-05-08 DIAGNOSIS — K8050 Calculus of bile duct without cholangitis or cholecystitis without obstruction: Secondary | ICD-10-CM

## 2020-05-08 DIAGNOSIS — K5900 Constipation, unspecified: Secondary | ICD-10-CM | POA: Diagnosis not present

## 2020-05-08 DIAGNOSIS — Z85820 Personal history of malignant melanoma of skin: Secondary | ICD-10-CM

## 2020-05-08 DIAGNOSIS — R6889 Other general symptoms and signs: Secondary | ICD-10-CM | POA: Diagnosis not present

## 2020-05-08 DIAGNOSIS — Z9842 Cataract extraction status, left eye: Secondary | ICD-10-CM | POA: Diagnosis not present

## 2020-05-08 DIAGNOSIS — Z888 Allergy status to other drugs, medicaments and biological substances status: Secondary | ICD-10-CM

## 2020-05-08 DIAGNOSIS — R9431 Abnormal electrocardiogram [ECG] [EKG]: Secondary | ICD-10-CM | POA: Diagnosis not present

## 2020-05-08 DIAGNOSIS — K8010 Calculus of gallbladder with chronic cholecystitis without obstruction: Secondary | ICD-10-CM | POA: Diagnosis not present

## 2020-05-08 DIAGNOSIS — N183 Chronic kidney disease, stage 3 unspecified: Secondary | ICD-10-CM | POA: Diagnosis present

## 2020-05-08 DIAGNOSIS — K563 Gallstone ileus: Secondary | ICD-10-CM | POA: Diagnosis not present

## 2020-05-08 DIAGNOSIS — I499 Cardiac arrhythmia, unspecified: Secondary | ICD-10-CM | POA: Diagnosis not present

## 2020-05-08 DIAGNOSIS — K819 Cholecystitis, unspecified: Secondary | ICD-10-CM

## 2020-05-08 DIAGNOSIS — D509 Iron deficiency anemia, unspecified: Secondary | ICD-10-CM | POA: Diagnosis not present

## 2020-05-08 DIAGNOSIS — G4730 Sleep apnea, unspecified: Secondary | ICD-10-CM | POA: Diagnosis not present

## 2020-05-08 DIAGNOSIS — Z9841 Cataract extraction status, right eye: Secondary | ICD-10-CM | POA: Diagnosis not present

## 2020-05-08 DIAGNOSIS — R1011 Right upper quadrant pain: Secondary | ICD-10-CM

## 2020-05-08 LAB — CBC WITH DIFFERENTIAL/PLATELET
Abs Immature Granulocytes: 0.04 10*3/uL (ref 0.00–0.07)
Basophils Absolute: 0 10*3/uL (ref 0.0–0.1)
Basophils Relative: 0 %
Eosinophils Absolute: 0 10*3/uL (ref 0.0–0.5)
Eosinophils Relative: 0 %
HCT: 37.4 % — ABNORMAL LOW (ref 39.0–52.0)
Hemoglobin: 12.3 g/dL — ABNORMAL LOW (ref 13.0–17.0)
Immature Granulocytes: 0 %
Lymphocytes Relative: 8 %
Lymphs Abs: 0.8 10*3/uL (ref 0.7–4.0)
MCH: 30.1 pg (ref 26.0–34.0)
MCHC: 32.9 g/dL (ref 30.0–36.0)
MCV: 91.4 fL (ref 80.0–100.0)
Monocytes Absolute: 0.8 10*3/uL (ref 0.1–1.0)
Monocytes Relative: 8 %
Neutro Abs: 8.8 10*3/uL — ABNORMAL HIGH (ref 1.7–7.7)
Neutrophils Relative %: 84 %
Platelets: 276 10*3/uL (ref 150–400)
RBC: 4.09 MIL/uL — ABNORMAL LOW (ref 4.22–5.81)
RDW: 13.2 % (ref 11.5–15.5)
WBC: 10.5 10*3/uL (ref 4.0–10.5)
nRBC: 0 % (ref 0.0–0.2)

## 2020-05-08 LAB — COMPREHENSIVE METABOLIC PANEL
ALT: 716 U/L — ABNORMAL HIGH (ref 0–44)
AST: 521 U/L — ABNORMAL HIGH (ref 15–41)
Albumin: 3.4 g/dL — ABNORMAL LOW (ref 3.5–5.0)
Alkaline Phosphatase: 409 U/L — ABNORMAL HIGH (ref 38–126)
Anion gap: 11 (ref 5–15)
BUN: 19 mg/dL (ref 8–23)
CO2: 22 mmol/L (ref 22–32)
Calcium: 8.9 mg/dL (ref 8.9–10.3)
Chloride: 101 mmol/L (ref 98–111)
Creatinine, Ser: 1.09 mg/dL (ref 0.61–1.24)
GFR, Estimated: >60 mL/min (ref >60)
Glucose, Bld: 133 mg/dL — ABNORMAL HIGH (ref 70–99)
Potassium: 4.2 mmol/L (ref 3.5–5.1)
Sodium: 134 mmol/L — ABNORMAL LOW (ref 135–145)
Total Bilirubin: 3.3 mg/dL — ABNORMAL HIGH (ref 0.3–1.2)
Total Protein: 6.8 g/dL (ref 6.5–8.1)

## 2020-05-08 LAB — PROTIME-INR
INR: 1 (ref 0.8–1.2)
Prothrombin Time: 13.1 seconds (ref 11.4–15.2)

## 2020-05-08 LAB — ETHANOL: Alcohol, Ethyl (B): <10 mg/dL (ref <10)

## 2020-05-08 LAB — LIPASE, BLOOD: Lipase: >10000 U/L — ABNORMAL HIGH (ref 11–51)

## 2020-05-08 MED ORDER — ONDANSETRON HCL 4 MG/2ML IJ SOLN: 4.0000 mg | Freq: Four times a day (QID) | INTRAMUSCULAR | Status: DC | PRN

## 2020-05-08 MED ORDER — PANTOPRAZOLE SODIUM 40 MG PO TBEC
40.0000 mg | DELAYED_RELEASE_TABLET | Freq: Every day | ORAL | Status: DC
  Administered 2020-05-09 – 2020-05-12 (×4): 40 mg via ORAL
  Filled 2020-05-08 (×4): qty 1

## 2020-05-08 MED ORDER — NITROGLYCERIN 0.4 MG SL SUBL: 0.4000 mg | SUBLINGUAL_TABLET | SUBLINGUAL | Status: DC | PRN

## 2020-05-08 MED ORDER — ONDANSETRON 4 MG PO TBDP: 4.0000 mg | ORAL_TABLET | Freq: Four times a day (QID) | ORAL | Status: DC | PRN

## 2020-05-08 MED ORDER — ISOSORBIDE MONONITRATE ER 30 MG PO TB24
30.0000 mg | ORAL_TABLET | Freq: Every day | ORAL | Status: DC
  Administered 2020-05-09 – 2020-05-12 (×4): 30 mg via ORAL
  Filled 2020-05-08 (×5): qty 1

## 2020-05-08 MED ORDER — FENTANYL CITRATE (PF) 100 MCG/2ML IJ SOLN: 25.0000 ug | INTRAMUSCULAR | Status: DC | PRN

## 2020-05-08 MED ORDER — SODIUM CHLORIDE 0.9 % IV BOLUS
500.0000 mL | Freq: Once | INTRAVENOUS | Status: AC
  Administered 2020-05-08: 500 mL via INTRAVENOUS

## 2020-05-08 MED ORDER — LEVOTHYROXINE SODIUM 125 MCG PO TABS
125.0000 ug | ORAL_TABLET | Freq: Every day | ORAL | Status: DC
  Administered 2020-05-09 – 2020-05-12 (×4): 125 ug via ORAL
  Filled 2020-05-08 (×4): qty 1

## 2020-05-08 MED ORDER — ENOXAPARIN SODIUM 40 MG/0.4ML ~~LOC~~ SOLN
40.0000 mg | SUBCUTANEOUS | Status: DC
  Administered 2020-05-09 – 2020-05-11 (×3): 40 mg via SUBCUTANEOUS
  Filled 2020-05-08 (×3): qty 0.4

## 2020-05-08 MED ORDER — MIRTAZAPINE 15 MG PO TABS
15.0000 mg | ORAL_TABLET | Freq: Every day | ORAL | Status: DC
  Administered 2020-05-09 – 2020-05-11 (×4): 15 mg via ORAL
  Filled 2020-05-08 (×3): qty 1
  Filled 2020-05-08: qty 2

## 2020-05-08 MED ORDER — SODIUM CHLORIDE 0.9 % IV SOLN
2.0000 g | INTRAVENOUS | Status: DC
  Administered 2020-05-08 – 2020-05-09 (×2): 2 g via INTRAVENOUS
  Filled 2020-05-08: qty 2
  Filled 2020-05-08 (×2): qty 20

## 2020-05-08 MED ORDER — DEXTROSE-NACL 5-0.9 % IV SOLN: INTRAVENOUS | Status: DC

## 2020-05-08 NOTE — Consult Note (Signed)
[facility] Medical Consultation  Gary Gonzalez MWN:[id number] DOB: [birth year] DOA: 05/08/2020 PCP: [name], MD   Requesting physician: Dr. Erroll Luna, general surgery Date of consultation: 05/08/2020 Reason for consultation: Medical management  Impression/Recommendations Principal Problem:   Acute cholecystitis Active Problems:   CAD (coronary artery disease)   Hypothyroidism (acquired)  Acute cholecystitis with possible choledocholithiasis: RUQ U/S shows cholelithiasis with CBD measuring 6.3 mm.  LFTs are elevated.  General surgery managing with plan for MRCP to further evaluate for choledocholithiasis and eventual cholecystectomy this admission.  CAD s/p CABG: Denies any chest pain.  Continue Imdur and check EKG.  Aspirin on hold for now pending surgical intervention.  Was seen by his cardiologist on 04/26/2020 for preoperative evaluation and considered an acceptable risk for surgical intervention.  Surgical risk remains the same, no acute issues to warrant further cardiac work-up at this time.  PVCs/PVC induced cardiomyopathy: Currently stable.  Previously on low-dose amiodarone which was discontinued last year due to abnormal liver enzymes.  Not requiring beta-blocker as an outpatient.  Hypothyroidism: Continue Synthroid.  TRH will followup again tomorrow. Please contact us if we can be of assistance in the meanwhile. Thank you for this consultation.  Chief Complaint: Abdominal pain, nausea  HPI:  [name] is a 87 year old male with medical history significant for CAD s/p CABG, frequent PVCs with PVC induced cardiomyopathy, hypothyroidism, hyperlipidemia who presents to the ED for evaluation of abdominal pain.  Patient has been following with general surgery as an outpatient for evaluation of gallstones.  He was seen by his cardiologist on 04/26/2020 for preoperative assessment and deemed to be an acceptable surgical risk.  Last night patient developed recurrent  severe right upper quadrant abdominal pain associated with nausea and diaphoresis.  Pain lasted throughout the night and symptoms were severe enough that he had to come to the ED for further evaluation.  In the ED he was noted to have abnormal LFTs with AST 521, ALT 716, alk phos 49, total bilirubin 3.3.CBC 10.5.  RUQ ultrasound shows cholelithiasis without evidence of wall thickening.  CBD diameter 6.3 mm.  General surgery were consulted to admit patient.  MRCP planned to assess for possible choledocholithiasis.  The hospitalist service was asked to consult for assistance with medical management.  Patient reports improved abdominal pain and nausea.  He denies any subjective fevers, chest pain, palpitations, shortness of breath, cough, dysuria, diarrhea.  Review of Systems:  All systems reviewed and are negative except as documented in history of present illness above.  Past Medical History:  Diagnosis Date  . Anemia   . Arthritis    "touch in the fingers" (11/17/2014)  . Chronic combined systolic and diastolic CHF (congestive heart failure) ([place])    a. 11/17/14 showed EF 25-30%, suboptimal image quality, diffuse hypokinesis worse in the inferior wall, mild to moderate MR. New drop EF compared to 2012 but cath was stable.  . CKD (chronic kidney disease), stage III ([place])   . Coronary artery disease    a. s/p 2v CABG (seq LIMA to Diag and LAD, SVG to OM) on 05/15/2001.  . Fibromyalgia    "a long time ago" (11/17/2014)  . History of gout   . Hyperlipidemia   . Hypothyroidism   . LBBB (left bundle branch block)   . Melanoma of back ([place])   . NSVT (nonsustained ventricular tachycardia) ([place])   . PMR (polymyalgia rheumatica) (HCC)   . PVC's (premature ventricular contractions)   . Sleep apnea    "  wife says I do" (11/18/2014)   Past Surgical History:  Procedure Laterality Date  . AXILLARY LYMPH NODE DISSECTION Bilateral 2001  . CARDIAC CATHETERIZATION  04/28/2001  . CARDIAC CATHETERIZATION  N/A 11/19/2014   Procedure: Right/Left Heart Cath and Coronary/Graft Angiography;  Surgeon: Jettie Booze, MD;  Location: [facility];  Service: Cardiovascular;  Laterality: N/A;  . CATARACT EXTRACTION W/ INTRAOCULAR LENS  IMPLANT, BILATERAL Bilateral ~ 2010  . CORONARY ARTERY BYPASS GRAFT  05/15/2001   x3 sequential LIMA to diag and LAD, SVG to OM   . MELANOMA EXCISION  2001   off of back   Social History:  reports that he has never smoked. He has never used smokeless tobacco. He reports current alcohol use of about 6.0 standard drinks of alcohol per week. He reports that he does not use drugs.  Allergies  Allergen Reactions  . Atenolol Diarrhea and Other (See Comments)    Very weak, fatigued, lethargic, no appetite   Family History  Problem Relation Age of Onset  . Prostate cancer Father   . Liver cancer Mother   . Hyperlipidemia Son   . Hyperlipidemia Daughter     Prior to Admission medications   Medication Sig Start Date End Date Taking? Authorizing Provider  aspirin 81 MG tablet Take 81 mg by mouth daily.   Yes [provider]  Evolocumab (REPATHA SURECLICK) 601 MG/ML SOAJ Inject 140 mg into the skin every 28 (twenty-eight) days.   Yes [provider]  ezetimibe (ZETIA) 10 MG tablet Take 10 mg by mouth daily.   Yes [provider]  isosorbide mononitrate (IMDUR) 30 MG 24 hr tablet Take 30 mg by mouth daily.   Yes [provider]  levothyroxine (SYNTHROID, LEVOTHROID) 125 MCG tablet Take 1 tablet by mouth daily before breakfast. 04/13/16  Yes [provider]  mirtazapine (REMERON) 15 MG tablet Take 15 mg by mouth at bedtime.   Yes [provider]  omeprazole (PRILOSEC) 20 MG capsule Take 20 mg by mouth daily.   Yes [provider]  isosorbide mononitrate (IMDUR) 30 MG 24 hr tablet Take 1 tablet (30 mg total) by mouth daily. 07/30/19 10/28/19  Shirley Friar, PA-C  nitroGLYCERIN (NITROSTAT) 0.4 MG SL  tablet Place 1 tablet (0.4 mg total) under the tongue every 5 (five) minutes as needed for chest pain. 07/30/19 10/28/19  Shirley Friar, PA-C  nitroGLYCERIN (NITROSTAT) 0.4 MG SL tablet Place 0.4 mg under the tongue every 5 (five) minutes as needed for chest pain.    [provider]   Physical Exam: Blood pressure (!) 166/81, pulse 83, temperature 97.6 F (36.4 C), temperature source Oral, resp. rate 18, height 6' (1.829 m), weight 66 kg, SpO2 99 %. Vitals:   05/08/20 1630 05/08/20 1736  BP:  (!) 166/81  Pulse:  83  Resp: (!) 21 18  Temp:    SpO2:  99%   Constitutional: Sitting up in bed, NAD, calm, comfortable Eyes: PERRL, lids and conjunctivae normal ENMT: Mucous membranes are moist. Posterior pharynx clear of any exudate or lesions.Normal dentition.  Neck: normal, supple, no masses. Respiratory: clear to auscultation bilaterally, no wheezing, no crackles. Normal respiratory effort. No accessory muscle use.  Cardiovascular: Regular rate and rhythm, no murmurs / rubs / gallops. No extremity edema. 2+ pedal pulses. Abdomen: RUQ tenderness to palpation, no masses palpated. No hepatosplenomegaly. Bowel sounds diminished.  Musculoskeletal: no clubbing / cyanosis. No joint deformity upper and lower extremities. Good ROM, no  contractures. Normal muscle tone.  Skin: no rashes, lesions, ulcers. No induration Neurologic: CN 2-12 grossly intact. Sensation intact, Strength 5/5 in all 4.  Psychiatric: Normal judgment and insight. Alert and oriented x 3. Normal mood.   Labs on Admission:  Basic Metabolic Panel: Recent Labs  Lab 05/08/20 1539  NA 134*  K 4.2  CL 101  CO2 22  GLUCOSE 133*  BUN 19  CREATININE 1.09  CALCIUM 8.9   Liver Function Tests: Recent Labs  Lab 05/08/20 1539  AST 521*  ALT 716*  ALKPHOS 409*  BILITOT 3.3*  PROT 6.8  ALBUMIN 3.4*   No results for input(s): LIPASE, AMYLASE in the last 168 hours. No results for input(s): AMMONIA in the last 168  hours. CBC: Recent Labs  Lab 05/08/20 1539  WBC 10.5  NEUTROABS 8.8*  HGB 12.3*  HCT 37.4*  MCV 91.4  PLT 276   Cardiac Enzymes: No results for input(s): CKTOTAL, CKMB, CKMBINDEX, TROPONINI in the last 168 hours. BNP: Invalid input(s): POCBNP CBG: No results for input(s): GLUCAP in the last 168 hours.  Radiological Exams on Admission: US Abdomen Limited RUQ (LIVER/GB)  Result Date: 05/08/2020 CLINICAL DATA:  Right upper quadrant pain EXAM: ULTRASOUND ABDOMEN LIMITED RIGHT UPPER QUADRANT COMPARISON:  None. FINDINGS: Gallbladder: Multiple stones are seen in the gallbladder. No wall thickening, pericholecystic fluid, or Murphy's sign. Common bile duct: Diameter: 6 mm Liver: No focal lesion identified. Within normal limits in parenchymal echogenicity. Portal vein is patent on color Doppler imaging with normal direction of blood flow towards the liver. Other: None. IMPRESSION: 1. Cholelithiasis without wall thickening, pericholecystic fluid, or Murphy's sign. If the clinical picture remains ambiguous, recommend HIDA scan. 2. The common bile duct is borderline measuring 6.3 mm. Recommend correlation with clinical picture and labs. If there is concern for biliary obstruction, recommend MRCP or ERCP. Electronically Signed   By: Dorise Bullion III M.D   On: 05/08/2020 16:30    EKG: Ordered and pending.  Zada Finders, MD [facility]   If 7PM-7AM, please contact night-coverage [email or web address] 05/08/2020, 6:31 PM

## 2020-05-08 NOTE — H&P (Signed)
Gary Gonzalez is an 87 y.o. male.   Chief Complaint: Abdominal pain HPI: Patient presents emergency room with a 1 day history of severe epigastric and right upper quadrant pain.  He has been seen by Dr. Zenia Resides at [facility] surgery and evaluated for gallstones.  He was cleared by his cardiologist Dr. [name] to proceed with laparoscopic cholecystectomy hopefully sometime next month.  Unfortunately, he developed sharp right upper quadrant pain with nausea and vomiting which lasted for the last 18 hours.  It is not improved.  He was brought in by EMS and evaluated.  He is more comfortable now after pain medication.  Of note he does have significantly elevated liver function studies with his AST and ALT of being over 400, his total bilirubin to be 3.7 and his alk phos of 440. Ultrasound shows gallstones without obvious gallbladder wall thickening but the common bile duct is 6.3 mm which is mildly enlarged. Past Medical History:  Diagnosis Date  . Anemia   . Arthritis    "touch in the fingers" (11/17/2014)  . Chronic combined systolic and diastolic CHF (congestive heart failure) ([place])    a. 11/17/14 showed EF 25-30%, suboptimal image quality, diffuse hypokinesis worse in the inferior wall, mild to moderate MR. New drop EF compared to 2012 but cath was stable.  . CKD (chronic kidney disease), stage III ([place])   . Coronary artery disease    a. s/p 2v CABG (seq LIMA to Diag and LAD, SVG to OM) on 05/15/2001.  . Fibromyalgia    "a long time ago" (11/17/2014)  . History of gout   . Hyperlipidemia   . Hypothyroidism   . LBBB (left bundle branch block)   . Melanoma of back ([place])   . NSVT (nonsustained ventricular tachycardia) ([place])   . PMR (polymyalgia rheumatica) ([facility])   . PVC's (premature ventricular contractions)   . Sleep apnea    "wife says I do" (11/18/2014)    Past Surgical History:  Procedure Laterality Date  . AXILLARY LYMPH NODE DISSECTION Bilateral 2001  . CARDIAC CATHETERIZATION  04/28/2001   . CARDIAC CATHETERIZATION N/A 11/19/2014   Procedure: Right/Left Heart Cath and Coronary/Graft Angiography;  Surgeon: Jettie Booze, MD;  Location: [facility];  Service: Cardiovascular;  Laterality: N/A;  . CATARACT EXTRACTION W/ INTRAOCULAR LENS  IMPLANT, BILATERAL Bilateral ~ 2010  . CORONARY ARTERY BYPASS GRAFT  05/15/2001   x3 sequential LIMA to diag and LAD, SVG to OM   . MELANOMA EXCISION  2001   off of back    Family History  Problem Relation Age of Onset  . Prostate cancer Father   . Liver cancer Mother   . Hyperlipidemia Son   . Hyperlipidemia Daughter    Social History:  reports that he has never smoked. He has never used smokeless tobacco. He reports current alcohol use of about 6.0 standard drinks of alcohol per week. He reports that he does not use drugs.  Allergies:  Allergies  Allergen Reactions  . Atenolol Diarrhea and Other (See Comments)    Very weak, fatigued, lethargic, no appetite    (Not in a hospital admission)   Results for orders placed or performed during the hospital encounter of 05/08/20 (from the past 48 hour(s))  Comprehensive metabolic panel     Status: Abnormal   Collection Time: 05/08/20  3:39 PM  Result Value Ref Range   Sodium 134 (L) 135 - 145 mmol/L   Potassium 4.2 3.5 - 5.1 mmol/L  Chloride 101 98 - 111 mmol/L   CO2 22 22 - 32 mmol/L   Glucose, Bld 133 (H) 70 - 99 mg/dL    Comment: Glucose reference range applies only to samples taken after fasting for at least 8 hours.   BUN 19 8 - 23 mg/dL   Creatinine, Ser 1.09 0.61 - 1.24 mg/dL   Calcium 8.9 8.9 - 10.3 mg/dL   Total Protein 6.8 6.5 - 8.1 g/dL   Albumin 3.4 (L) 3.5 - 5.0 g/dL   AST 521 (H) 15 - 41 U/L   ALT 716 (H) 0 - 44 U/L   Alkaline Phosphatase 409 (H) 38 - 126 U/L   Total Bilirubin 3.3 (H) 0.3 - 1.2 mg/dL   GFR, Estimated >60 >60 mL/min    Comment: (NOTE) Calculated using the CKD-EPI Creatinine Equation (2021)    Anion gap 11 5 - 15    Comment: Performed  at [facility], [place] [address], [place], [place] [address]  Ethanol     Status: None   Collection Time: 05/08/20  3:39 PM  Result Value Ref Range   Alcohol, Ethyl (B) <10 <10 mg/dL    Comment: (NOTE) Lowest detectable limit for serum alcohol is 10 mg/dL.  For medical purposes only. Performed at [facility], [place] [address], [place], [place] [address]   CBC with Differential     Status: Abnormal   Collection Time: 05/08/20  3:39 PM  Result Value Ref Range   WBC 10.5 4.0 - 10.5 K/uL   RBC 4.09 (L) 4.22 - 5.81 MIL/uL   Hemoglobin 12.3 (L) 13.0 - 17.0 g/dL   HCT 37.4 (L) 39.0 - 52.0 %   MCV 91.4 80.0 - 100.0 fL   MCH 30.1 26.0 - 34.0 pg   MCHC 32.9 30.0 - 36.0 g/dL   RDW 13.2 11.5 - 15.5 %   Platelets 276 150 - 400 K/uL   nRBC 0.0 0.0 - 0.2 %   Neutrophils Relative % 84 %   Neutro Abs 8.8 (H) 1.7 - 7.7 K/uL   Lymphocytes Relative 8 %   Lymphs Abs 0.8 0.7 - 4.0 K/uL   Monocytes Relative 8 %   Monocytes Absolute 0.8 0.1 - 1.0 K/uL   Eosinophils Relative 0 %   Eosinophils Absolute 0.0 0.0 - 0.5 K/uL   Basophils Relative 0 %   Basophils Absolute 0.0 0.0 - 0.1 K/uL   Immature Granulocytes 0 %   Abs Immature Granulocytes 0.04 0.00 - 0.07 K/uL    Comment: Performed at [facility], [place] [address], [place], [place] [address]  Protime-INR     Status: None   Collection Time: 05/08/20  3:39 PM  Result Value Ref Range   Prothrombin Time 13.1 11.4 - 15.2 seconds   INR 1.0 0.8 - 1.2    Comment: (NOTE) INR goal varies based on device and disease states. Performed at [facility], [place] [address] [place], [place] [address]    US Abdomen Limited RUQ (LIVER/GB)  Result Date: 05/08/2020 CLINICAL DATA:  Right upper quadrant pain EXAM: ULTRASOUND ABDOMEN LIMITED RIGHT UPPER QUADRANT COMPARISON:  None. FINDINGS: Gallbladder: Multiple stones are seen in the gallbladder. No wall thickening, pericholecystic fluid, or  Murphy's sign. Common bile duct: Diameter: 6 mm Liver: No focal lesion identified. Within normal limits in parenchymal echogenicity. Portal vein is patent on color Doppler imaging with normal direction of blood flow towards the liver. Other: None. IMPRESSION: 1. Cholelithiasis without wall  thickening, pericholecystic fluid, or Murphy's sign. If the clinical picture remains ambiguous, recommend HIDA scan. 2. The common bile duct is borderline measuring 6.3 mm. Recommend correlation with clinical picture and labs. If there is concern for biliary obstruction, recommend MRCP or ERCP. Electronically Signed   By: Dorise Bullion III M.D   On: 05/08/2020 16:30    Review of Systems  Gastrointestinal: Positive for abdominal pain.  All other systems reviewed and are negative.   Blood pressure (!) 166/81, pulse 83, temperature 97.6 F (36.4 C), temperature source Oral, resp. rate 18, height 6' (1.829 m), weight 66 kg, SpO2 99 %. Physical Exam Vitals reviewed.  Constitutional:      Appearance: He is well-developed.     Comments: Appears much younger than his stated age  HENT:     Head: Normocephalic and atraumatic.  Eyes:     Extraocular Movements: Extraocular movements intact.     Pupils: Pupils are equal, round, and reactive to light.     Comments: No obvious scleral icterus  Cardiovascular:     Rate and Rhythm: Normal rate and regular rhythm.  Pulmonary:     Effort: Pulmonary effort is normal.     Breath sounds: Normal breath sounds.  Abdominal:     General: Abdomen is flat.     Palpations: Abdomen is soft.     Tenderness: There is abdominal tenderness in the right upper quadrant. Positive signs include Murphy's sign.     Hernia: No hernia is present.  Skin:    General: Skin is warm and dry.  Neurological:     General: No focal deficit present.     Mental Status: He is alert.      Assessment/Plan Acute cholecystitis with possible choledocholithiasis-admit for IV fluids, MRCP and  recheck labs.  He is comfortable currently and may have passed the stone already.  He is of good health and has been cleared for cholecystectomy which I think should be performed during this admission.  I discussed the pros and cons of surgery especially at his advanced age but he physically he is in much better shape than his numerical age.  Medicine has been consulted to help manage his complex medical problems but overall these are well controlled  Clear liquids until midnight.  Order MRCP and recheck labs in morning.  Plan for laparoscopic cholecystectomy during this admission with Dr. Ninfa Linden who is the doctor of the week  Turner Daniels, MD 05/08/2020, 6:20 PM

## 2020-05-08 NOTE — ED Triage Notes (Signed)
diagnosed with gall stones x4 weeks ago, abd pain 10/10 with nausea since 9pm

## 2020-05-08 NOTE — ED Notes (Signed)
US at bedside

## 2020-05-08 NOTE — ED Provider Notes (Signed)
[facility] DEPT Provider Note   CSN: [id number] Arrival date & time: 05/08/20  1510     History Chief Complaint  Patient presents with  . Abdominal Pain    Gary Gonzalez is a 87 y.o. male.  HPI Patient presents after a night spent with severe abdominal pain and nausea. He has a history of what sounds like biliary colic, has been seen and evaluated by our surgeons as an outpatient.  He notes that he is scheduled for outpatient follow-up in the coming weeks.  Since his most recent visit with the surgery he has seen his cardiologist, has had clearance for surgery, according to him.  He was in his usual state of health, experiencing typical episodes of about 30 minutes of upper abdominal pain, until yesterday, when about 18 hours ago he developed severe pain in the epigastrium right upper quadrant, left upper quadrant. There is associated nausea, generalized discomfort, but no vomiting. Pain was severe, minimally improved with Tylenol, though it has improved gradually over the interval hours. Currently he notes his pain is tolerable. There was no lower abdominal pain, no diarrhea, no fever. He has received his coronavirus vaccines.   He is taking omeprazole, which was started a few weeks ago by his gastroenterologist. Past Medical History:  Diagnosis Date  . Anemia   . Arthritis    "touch in the fingers" (11/17/2014)  . Chronic combined systolic and diastolic CHF (congestive heart failure) ([place])    a. 11/17/14 showed EF 25-30%, suboptimal image quality, diffuse hypokinesis worse in the inferior wall, mild to moderate MR. New drop EF compared to 2012 but cath was stable.  . CKD (chronic kidney disease), stage III ([place])   . Coronary artery disease    a. s/p 2v CABG (seq LIMA to Diag and LAD, SVG to OM) on 05/15/2001.  . Fibromyalgia    "a long time ago" (11/17/2014)  . History of gout   . Hyperlipidemia   . Hypothyroidism   . LBBB (left bundle branch block)    . Melanoma of back ([place])   . NSVT (nonsustained ventricular tachycardia) ([place])   . PMR (polymyalgia rheumatica) ([facility])   . PVC's (premature ventricular contractions)   . Sleep apnea    "wife says I do" (11/18/2014)    Patient Active Problem List   Diagnosis Date Noted  . Hypothyroidism (acquired) 10/29/2016  . Depression 10/29/2016  . Chest pain 10/29/2016  . Elevated blood pressure reading 10/29/2016  . Chronic combined systolic and diastolic CHF (congestive heart failure) ([place])   . S/P CABG x 2   . CKD (chronic kidney disease), stage III ([place])   . Fatigue 11/11/2014  . NSVT (nonsustained ventricular tachycardia) ([place]) 11/11/2014  . LV dysfunction 07/23/2014  . Hyperlipidemia 07/27/2013  . Dizzy spells 10/24/2011  . CAD (coronary artery disease) 03/30/2011  . PVC's (premature ventricular contractions) 03/30/2011  . ANEMIA, IRON DEFICIENCY 03/12/2008  . GERD 03/12/2008    Past Surgical History:  Procedure Laterality Date  . AXILLARY LYMPH NODE DISSECTION Bilateral 2001  . CARDIAC CATHETERIZATION  04/28/2001  . CARDIAC CATHETERIZATION N/A 11/19/2014   Procedure: Right/Left Heart Cath and Coronary/Graft Angiography;  Surgeon: Jettie Booze, MD;  Location: [facility];  Service: Cardiovascular;  Laterality: N/A;  . CATARACT EXTRACTION W/ INTRAOCULAR LENS  IMPLANT, BILATERAL Bilateral ~ 2010  . CORONARY ARTERY BYPASS GRAFT  05/15/2001   x3 sequential LIMA to diag and LAD, SVG to OM   . MELANOMA EXCISION  2001   off of back       Family History  Problem Relation Age of Onset  . Prostate cancer Father   . Liver cancer Mother   . Hyperlipidemia Son   . Hyperlipidemia Daughter     Social History   Tobacco Use  . Smoking status: Never Smoker  . Smokeless tobacco: Never Used  Substance Use Topics  . Alcohol use: Yes    Alcohol/week: 6.0 standard drinks    Types: 3 Glasses of wine, 3 Cans of beer per week  . Drug use: No    Home Medications Prior to  Admission medications   Medication Sig Start Date End Date Taking? Authorizing Provider  aspirin 81 MG tablet Take 81 mg by mouth daily.   Yes [provider]  Evolocumab (REPATHA SURECLICK) 528 MG/ML SOAJ Inject 140 mg into the skin every 28 (twenty-eight) days.   Yes [provider]  ezetimibe (ZETIA) 10 MG tablet Take 10 mg by mouth daily.   Yes [provider]  isosorbide mononitrate (IMDUR) 30 MG 24 hr tablet Take 30 mg by mouth daily.   Yes [provider]  levothyroxine (SYNTHROID, LEVOTHROID) 125 MCG tablet Take 1 tablet by mouth daily before breakfast. 04/13/16  Yes [provider]  mirtazapine (REMERON) 15 MG tablet Take 15 mg by mouth at bedtime.   Yes [provider]  omeprazole (PRILOSEC) 20 MG capsule Take 20 mg by mouth daily.   Yes [provider]  isosorbide mononitrate (IMDUR) 30 MG 24 hr tablet Take 1 tablet (30 mg total) by mouth daily. 07/30/19 10/28/19  Shirley Friar, PA-C  nitroGLYCERIN (NITROSTAT) 0.4 MG SL tablet Place 1 tablet (0.4 mg total) under the tongue every 5 (five) minutes as needed for chest pain. 07/30/19 10/28/19  Shirley Friar, PA-C  nitroGLYCERIN (NITROSTAT) 0.4 MG SL tablet Place 0.4 mg under the tongue every 5 (five) minutes as needed for chest pain.    [provider]    Allergies    Atenolol  Review of Systems   Review of Systems  Constitutional:       Per HPI, otherwise negative  HENT:       Per HPI, otherwise negative  Respiratory:       Per HPI, otherwise negative  Cardiovascular:       Per HPI, otherwise negative  Gastrointestinal: Positive for nausea. Negative for vomiting.  Endocrine:       Negative aside from HPI  Genitourinary:       Neg aside from HPI   Musculoskeletal:       Per HPI, otherwise negative  Skin: Negative.   Neurological: Negative for syncope.    Physical Exam Updated Vital Signs BP (!) 153/82   Pulse 68   Temp 97.6 F (36.4  C) (Oral)   Resp (!) 21   Ht 6' (1.829 m)   Wt 66 kg   SpO2 98%   BMI 19.73 kg/m   Physical Exam Vitals and nursing note reviewed.  Constitutional:      General: He is not in acute distress.    Appearance: He is well-developed.  HENT:     Head: Normocephalic and atraumatic.  Eyes:     Extraocular Movements: EOM normal.     Conjunctiva/sclera: Conjunctivae normal.  Cardiovascular:     Rate and Rhythm: Normal rate and regular rhythm.  Pulmonary:     Effort: Pulmonary effort is normal. No respiratory distress.     Breath sounds:  No stridor.  Abdominal:     General: There is no distension.     Tenderness: There is abdominal tenderness in the epigastric area.  Musculoskeletal:        General: No edema.  Skin:    General: Skin is warm and dry.  Neurological:     Mental Status: He is alert and oriented to person, place, and time.  Psychiatric:        Mood and Affect: Mood and affect normal.      ED Results / Procedures / Treatments   Labs (all labs ordered are listed, but only abnormal results are displayed) Labs Reviewed  COMPREHENSIVE METABOLIC PANEL - Abnormal; Notable for the following components:      Result Value   Sodium 134 (*)    Glucose, Bld 133 (*)    Albumin 3.4 (*)    AST 521 (*)    ALT 716 (*)    Alkaline Phosphatase 409 (*)    Total Bilirubin 3.3 (*)    All other components within normal limits  CBC WITH DIFFERENTIAL/PLATELET - Abnormal; Notable for the following components:   RBC 4.09 (*)    Hemoglobin 12.3 (*)    HCT 37.4 (*)    Neutro Abs 8.8 (*)    All other components within normal limits  SARS CORONAVIRUS 2 (TAT 6-24 HRS)  ETHANOL  PROTIME-INR  LIPASE, BLOOD    Radiology US Abdomen Limited RUQ (LIVER/GB)  Result Date: 05/08/2020 CLINICAL DATA:  Right upper quadrant pain EXAM: ULTRASOUND ABDOMEN LIMITED RIGHT UPPER QUADRANT COMPARISON:  None. FINDINGS: Gallbladder: Multiple stones are seen in the gallbladder. No wall thickening,  pericholecystic fluid, or Murphy's sign. Common bile duct: Diameter: 6 mm Liver: No focal lesion identified. Within normal limits in parenchymal echogenicity. Portal vein is patent on color Doppler imaging with normal direction of blood flow towards the liver. Other: None. IMPRESSION: 1. Cholelithiasis without wall thickening, pericholecystic fluid, or Murphy's sign. If the clinical picture remains ambiguous, recommend HIDA scan. 2. The common bile duct is borderline measuring 6.3 mm. Recommend correlation with clinical picture and labs. If there is concern for biliary obstruction, recommend MRCP or ERCP. Electronically Signed   By: Dorise Bullion III M.D   On: 05/08/2020 16:30    Procedures Procedures (including critical care time)  Medications Ordered in ED Medications - No data to display  ED Course  I have reviewed the triage vital signs and the nursing notes.  Pertinent labs & imaging results that were available during my care of the patient were reviewed by me and considered in my medical decision making (see chart for details).   With consideration of acute cholecystitis versus gastritis or other abdominal processes the patient had labs, ultrasound ordered after initial evaluation. Pulse oximetry 98% room air normal Cardiac 80 sinus unremarkable   5:37 PM Patient accompanied by his daughter.  We discussed all findings, and have already discussed his case with our surgeon, Dr. Brantley Stage. Ultrasound notable for cholelithiasis, labs consistent with worsening hepatic function, given this constellation, the patient will be admitted for likely cholecystectomy. I discussed the patient's admission with family, they are amenable. Given the patient's age, cardiac history, though he has seen his cardiologist recently he will also have consultation from our internal medicine colleagues for medical clearance.  Final Clinical Impression(s) / ED Diagnoses Final diagnoses:  Cholecystitis    MDM Number of Diagnoses or Management Options Cholecystitis: new, needed workup   Amount and/or Complexity of Data Reviewed Clinical lab  tests: reviewed Tests in the radiology section of CPT: reviewed Tests in the medicine section of CPT: reviewed Decide to obtain previous medical records or to obtain history from someone other than the patient: yes Obtain history from someone other than the patient: yes Review and summarize past medical records: yes Discuss the patient with other providers: yes Independent visualization of images, tracings, or specimens: yes  Risk of Complications, Morbidity, and/or Mortality Presenting problems: high Diagnostic procedures: high Management options: high  Critical Care Total time providing critical care: < 30 minutes  Patient Progress Patient progress: stable    Carmin Muskrat, MD 05/08/20 1739

## 2020-05-09 ENCOUNTER — Inpatient Hospital Stay (HOSPITAL_COMMUNITY): Payer: PPO

## 2020-05-09 LAB — CBC
HCT: 32.9 % — ABNORMAL LOW (ref 39.0–52.0)
Hemoglobin: 10.8 g/dL — ABNORMAL LOW (ref 13.0–17.0)
MCH: 30.5 pg (ref 26.0–34.0)
MCHC: 32.8 g/dL (ref 30.0–36.0)
MCV: 92.9 fL (ref 80.0–100.0)
Platelets: 266 10*3/uL (ref 150–400)
RBC: 3.54 MIL/uL — ABNORMAL LOW (ref 4.22–5.81)
RDW: 13.5 % (ref 11.5–15.5)
WBC: 12.7 10*3/uL — ABNORMAL HIGH (ref 4.0–10.5)
nRBC: 0 % (ref 0.0–0.2)

## 2020-05-09 LAB — COMPREHENSIVE METABOLIC PANEL
ALT: 538 U/L — ABNORMAL HIGH (ref 0–44)
AST: 322 U/L — ABNORMAL HIGH (ref 15–41)
Albumin: 3.1 g/dL — ABNORMAL LOW (ref 3.5–5.0)
Alkaline Phosphatase: 401 U/L — ABNORMAL HIGH (ref 38–126)
Anion gap: 8 (ref 5–15)
BUN: 16 mg/dL (ref 8–23)
CO2: 25 mmol/L (ref 22–32)
Calcium: 8.4 mg/dL — ABNORMAL LOW (ref 8.9–10.3)
Chloride: 105 mmol/L (ref 98–111)
Creatinine, Ser: 1.18 mg/dL (ref 0.61–1.24)
GFR, Estimated: 58 mL/min — ABNORMAL LOW (ref >60)
Glucose, Bld: 111 mg/dL — ABNORMAL HIGH (ref 70–99)
Potassium: 4.4 mmol/L (ref 3.5–5.1)
Sodium: 138 mmol/L (ref 135–145)
Total Bilirubin: 1.7 mg/dL — ABNORMAL HIGH (ref 0.3–1.2)
Total Protein: 6.3 g/dL — ABNORMAL LOW (ref 6.5–8.1)

## 2020-05-09 LAB — LIPASE, BLOOD: Lipase: 946 U/L — ABNORMAL HIGH (ref 11–51)

## 2020-05-09 LAB — SURGICAL PCR SCREEN
MRSA, PCR: NEGATIVE
Staphylococcus aureus: NEGATIVE

## 2020-05-09 LAB — GLUCOSE, CAPILLARY: Glucose-Capillary: 117 mg/dL — ABNORMAL HIGH (ref 70–99)

## 2020-05-09 LAB — SARS CORONAVIRUS 2 (TAT 6-24 HRS): SARS Coronavirus 2: NEGATIVE

## 2020-05-09 MED ORDER — GADOBUTROL 1 MMOL/ML IV SOLN
7.0000 mL | Freq: Once | INTRAVENOUS | Status: AC | PRN
  Administered 2020-05-09: 7 mL via INTRAVENOUS

## 2020-05-09 NOTE — Progress Notes (Signed)
   Subjective/Chief Complaint: Patient reports less pain this morning in the abdomen   Objective: Vital signs in last 24 hours: Temp:  [97.6 F (36.4 C)-98.1 F (36.7 C)] 98 F (36.7 C) (01/10 0651) Pulse Rate:  [68-106] 74 (01/10 0651) Resp:  [11-28] 17 (01/10 0651) BP: (101-166)/(56-82) 101/56 (01/10 0651) SpO2:  [94 %-100 %] 96 % (01/10 0651) Weight:  [66 kg] 66 kg (01/09 1532) Last BM Date: 05/07/20  Intake/Output from previous day: 01/09 0701 - 01/10 0700 In: 500 [IV Piggyback:500] Out: -  Intake/Output this shift: Total I/O In: -  Out: 350 [Urine:350]  Exam: Awake and alert Abdomen soft but still with tenderness and guarding in the epigastrium  Lab Results:  Recent Labs    05/08/20 1539 05/09/20 0809  WBC 10.5 12.7*  HGB 12.3* 10.8*  HCT 37.4* 32.9*  PLT 276 266   BMET Recent Labs    05/08/20 1539 05/09/20 0809  NA 134* 138  K 4.2 4.4  CL 101 105  CO2 22 25  GLUCOSE 133* 111*  BUN 19 16  CREATININE 1.09 1.18  CALCIUM 8.9 8.4*   PT/INR Recent Labs    05/08/20 1539  LABPROT 13.1  INR 1.0   ABG No results for input(s): PHART, HCO3 in the last 72 hours.  Invalid input(s): PCO2, PO2  Studies/Results: US Abdomen Limited RUQ (LIVER/GB)  Result Date: 05/08/2020 CLINICAL DATA:  Right upper quadrant pain EXAM: ULTRASOUND ABDOMEN LIMITED RIGHT UPPER QUADRANT COMPARISON:  None. FINDINGS: Gallbladder: Multiple stones are seen in the gallbladder. No wall thickening, pericholecystic fluid, or Murphy's sign. Common bile duct: Diameter: 6 mm Liver: No focal lesion identified. Within normal limits in parenchymal echogenicity. Portal vein is patent on color Doppler imaging with normal direction of blood flow towards the liver. Other: None. IMPRESSION: 1. Cholelithiasis without wall thickening, pericholecystic fluid, or Murphy's sign. If the clinical picture remains ambiguous, recommend HIDA scan. 2. The common bile duct is borderline measuring 6.3 mm.  Recommend correlation with clinical picture and labs. If there is concern for biliary obstruction, recommend MRCP or ERCP. Electronically Signed   By: Dorise Bullion III M.D   On: 05/08/2020 16:30    Anti-infectives: Anti-infectives (From admission, onward)   Start     Dose/Rate Route Frequency Ordered Stop   05/08/20 2030  cefTRIAXone (ROCEPHIN) 2 g in sodium chloride 0.9 % 100 mL IVPB        2 g 200 mL/hr over 30 Minutes Intravenous Every 24 hours 05/08/20 2017        Assessment/Plan: Gallstone pancreatitis  LFTs are still elevated significantly but down from yesterday MRCP pending to r/o CBD stone Will allow clear liquids after MRCP Lipase today pending.  Was >10,000 yesterday Lap Chole with c-gram this week once pancreatitis improved.  Could be as early as tomorrow  LOS: 1 day    Coralie Keens 05/09/2020

## 2020-05-09 NOTE — Progress Notes (Signed)
PROGRESS NOTE    Gary Gonzalez  WCB:[id number] DOB: 26-Sep-1926 DOA: 05/08/2020 PCP: [name], MD   Chief Complain: Abdominal pain  Brief Narrative: Patient is a 87 year old male with history of coronary disease, PVC induced cardiomyopathy, hypothyroidism who initially presented with abdomen pain, nausea.  On presentation he was found to have elevated liver enzymes, severe elevated lipase.  Right upper quadrant ultrasound showed cholelithiasis.  He was admitted under general surgery service.  Planning for MRCP, cholecystectomy.  TRH consulted for medical management.  Assessment & Plan:   Principal Problem:   Acute cholecystitis Active Problems:   CAD (coronary artery disease)   Hypothyroidism (acquired)   Acute cholecystitis with possible choledocholithiasis: Right upper quadrant ultrasound showed cholelithiasis with CBD measuring 6.3 mm.  Elevated LFTs, lipase.  Plan for MRCP today with eventual cholecystectomy this admission.  Liver enzymes, lipase improving.  Mild leukocytosis present.  Management as per general surgery  Coronary artery disease: Status post CABG.  Denies any chest pain.  On Imdur, aspirin at home.  Follows with cardiology.  No further work-up planned at this admission.  History of PVC induced cardiomyopathy:Currently stable.  Follows with cardiology.  Continue current medications  Hypothyroidism: Continue Synthyroid  TRH will continue to follow         DVT prophylaxis:Lovenox Code Status: Full    Antimicrobials:  Anti-infectives (From admission, onward)   Start     Dose/Rate Route Frequency Ordered Stop   05/08/20 2030  cefTRIAXone (ROCEPHIN) 2 g in sodium chloride 0.9 % 100 mL IVPB        2 g 200 mL/hr over 30 Minutes Intravenous Every 24 hours 05/08/20 2017        Subjective: Patient seen and examined at the bedside this morning .  Very comfortable, denies any abdominal pain.  No nausea or vomiting  Objective: Vitals:   05/08/20 2300  05/09/20 0245 05/09/20 0431 05/09/20 0651  BP: 139/74 (!) 143/82 125/69 (!) 101/56  Pulse: 86 80 97 74  Resp: (!) 25 (!) 21 16 17   Temp:   98.1 F (36.7 C) 98 F (36.7 C)  TempSrc:   Oral Oral  SpO2: 97% 96% 94% 96%  Weight:      Height:        Intake/Output Summary (Last 24 hours) at 05/09/2020 1113 Last data filed at 05/09/2020 6160 Gross per 24 hour  Intake 500 ml  Output 350 ml  Net 150 ml   Filed Weights   05/08/20 1532  Weight: 66 kg    Examination:  General exam: Pleasant elderly male, not in distress HEENT:PERRL,Oral mucosa moist, Ear/Nose normal on gross exam Respiratory system: Bilateral equal air entry, normal vesicular breath sounds, no wheezes or crackles  Cardiovascular system: S1 & S2 heard, RRR. No JVD, murmurs, rubs, gallops or clicks. No pedal edema. Gastrointestinal system: Abdomen is nondistended, soft and tender in the right upper quadrant. No organomegaly or masses felt. Normal bowel sounds heard. Central nervous system: Alert and oriented. No focal neurological deficits. Extremities: No edema, no clubbing ,no cyanosis Skin: No rashes, lesions or ulcers,no icterus ,no pallor   Data Reviewed: I have personally reviewed following labs and imaging studies  CBC: Recent Labs  Lab 05/08/20 1539 05/09/20 0809  WBC 10.5 12.7*  NEUTROABS 8.8*  --   HGB 12.3* 10.8*  HCT 37.4* 32.9*  MCV 91.4 92.9  PLT 276 737   Basic Metabolic Panel: Recent Labs  Lab 05/08/20 1539 05/09/20 0809  NA 134* 138  K 4.2 4.4  CL 101 105  CO2 22 25  GLUCOSE 133* 111*  BUN 19 16  CREATININE 1.09 1.18  CALCIUM 8.9 8.4*   GFR: Estimated Creatinine Clearance: 38.1 mL/min (by C-G formula based on SCr of 1.18 mg/dL). Liver Function Tests: Recent Labs  Lab 05/08/20 1539 05/09/20 0809  AST 521* 322*  ALT 716* 538*  ALKPHOS 409* 401*  BILITOT 3.3* 1.7*  PROT 6.8 6.3*  ALBUMIN 3.4* 3.1*   Recent Labs  Lab 05/08/20 1539 05/09/20 0809  LIPASE >10,000* 946*    No results for input(s): AMMONIA in the last 168 hours. Coagulation Profile: Recent Labs  Lab 05/08/20 1539  INR 1.0   Cardiac Enzymes: No results for input(s): CKTOTAL, CKMB, CKMBINDEX, TROPONINI in the last 168 hours. BNP (last 3 results) No results for input(s): PROBNP in the last 8760 hours. HbA1C: No results for input(s): HGBA1C in the last 72 hours. CBG: No results for input(s): GLUCAP in the last 168 hours. Lipid Profile: No results for input(s): CHOL, HDL, LDLCALC, TRIG, CHOLHDL, LDLDIRECT in the last 72 hours. Thyroid Function Tests: No results for input(s): TSH, T4TOTAL, FREET4, T3FREE, THYROIDAB in the last 72 hours. Anemia Panel: No results for input(s): VITAMINB12, FOLATE, FERRITIN, TIBC, IRON, RETICCTPCT in the last 72 hours. Sepsis Labs: No results for input(s): PROCALCITON, LATICACIDVEN in the last 168 hours.  Recent Results (from the past 240 hour(s))  SARS CORONAVIRUS 2 (TAT 6-24 HRS) Nasopharyngeal Nasopharyngeal Swab     Status: None   Collection Time: 05/08/20  5:27 PM   Specimen: Nasopharyngeal Swab  Result Value Ref Range Status   SARS Coronavirus 2 NEGATIVE NEGATIVE Final    Comment: (NOTE) SARS-CoV-2 target nucleic acids are NOT DETECTED.  The SARS-CoV-2 RNA is generally detectable in upper and lower respiratory specimens during the acute phase of infection. Negative results do not preclude SARS-CoV-2 infection, do not rule out co-infections with other pathogens, and should not be used as the sole basis for treatment or other patient management decisions. Negative results must be combined with clinical observations, patient history, and epidemiological information. The expected result is Negative.  Fact Sheet for Patients: SugarRoll.be  Fact Sheet for Healthcare Providers: https://www.woods-mathews.com/  This test is not yet approved or cleared by the [place] FDA and  has been authorized for  detection and/or diagnosis of SARS-CoV-2 by FDA under an Emergency Use Authorization (EUA). This EUA will remain  in effect (meaning this test can be used) for the duration of the COVID-19 declaration under Se ction 564(b)(1) of the Act, 21 U.S.C. section 360bbb-3(b)(1), unless the authorization is terminated or revoked sooner.  Performed at [facility], [place] [address], [place], [place] [address]   Surgical pcr screen     Status: None   Collection Time: 05/09/20  4:53 AM   Specimen: Nasal Mucosa; Nasal Swab  Result Value Ref Range Status   MRSA, PCR NEGATIVE NEGATIVE Final   Staphylococcus aureus NEGATIVE NEGATIVE Final    Comment: (NOTE) The Xpert SA Assay (FDA approved for NASAL specimens in patients 89 years of age and older), is one component of a comprehensive surveillance program. It is not intended to diagnose infection nor to guide or monitor treatment. Performed at [facility], [place] [address] [place], [place] [address]          Radiology Studies: US Abdomen Limited RUQ (LIVER/GB)  Result Date: 05/08/2020 CLINICAL DATA:  Right upper quadrant pain EXAM: ULTRASOUND ABDOMEN LIMITED RIGHT UPPER  QUADRANT COMPARISON:  None. FINDINGS: Gallbladder: Multiple stones are seen in the gallbladder. No wall thickening, pericholecystic fluid, or Murphy's sign. Common bile duct: Diameter: 6 mm Liver: No focal lesion identified. Within normal limits in parenchymal echogenicity. Portal vein is patent on color Doppler imaging with normal direction of blood flow towards the liver. Other: None. IMPRESSION: 1. Cholelithiasis without wall thickening, pericholecystic fluid, or Murphy's sign. If the clinical picture remains ambiguous, recommend HIDA scan. 2. The common bile duct is borderline measuring 6.3 mm. Recommend correlation with clinical picture and labs. If there is concern for biliary obstruction, recommend MRCP or ERCP. Electronically Signed   By: Dorise Bullion  III M.D   On: 05/08/2020 16:30        Scheduled Meds: . enoxaparin (LOVENOX) injection  40 mg Subcutaneous Q24H  . isosorbide mononitrate  30 mg Oral Daily  . levothyroxine  125 mcg Oral QAC breakfast  . mirtazapine  15 mg Oral QHS  . pantoprazole  40 mg Oral Daily   Continuous Infusions: . cefTRIAXone (ROCEPHIN)  IV 2 g (05/08/20 2140)  . dextrose 5 % and 0.9% NaCl 75 mL/hr at 05/09/20 0406     LOS: 1 day    Time spent:25 mins. More than 50% of that time was spent in counseling and/or coordination of care.      Shelly Coss, MD [facility] P1/01/2021, 11:13 AM

## 2020-05-10 ENCOUNTER — Inpatient Hospital Stay (HOSPITAL_COMMUNITY): Payer: PPO | Admitting: Anesthesiology

## 2020-05-10 ENCOUNTER — Encounter (HOSPITAL_COMMUNITY): Admission: EM | Disposition: A | Discharge status: Home / Self Care | Payer: Self-pay | Source: Home / Self Care

## 2020-05-10 ENCOUNTER — Encounter (HOSPITAL_COMMUNITY): Payer: Self-pay

## 2020-05-10 ENCOUNTER — Inpatient Hospital Stay (HOSPITAL_COMMUNITY): Payer: PPO

## 2020-05-10 HISTORY — PX: CHOLECYSTECTOMY: SHX55

## 2020-05-10 LAB — COMPREHENSIVE METABOLIC PANEL
ALT: 434 U/L — ABNORMAL HIGH (ref 0–44)
AST: 189 U/L — ABNORMAL HIGH (ref 15–41)
Albumin: 3 g/dL — ABNORMAL LOW (ref 3.5–5.0)
Alkaline Phosphatase: 370 U/L — ABNORMAL HIGH (ref 38–126)
Anion gap: 9 (ref 5–15)
BUN: 19 mg/dL (ref 8–23)
CO2: 24 mmol/L (ref 22–32)
Calcium: 8.3 mg/dL — ABNORMAL LOW (ref 8.9–10.3)
Chloride: 105 mmol/L (ref 98–111)
Creatinine, Ser: 1.18 mg/dL (ref 0.61–1.24)
GFR, Estimated: 58 mL/min — ABNORMAL LOW (ref >60)
Glucose, Bld: 97 mg/dL (ref 70–99)
Potassium: 3.7 mmol/L (ref 3.5–5.1)
Sodium: 138 mmol/L (ref 135–145)
Total Bilirubin: 1.2 mg/dL (ref 0.3–1.2)
Total Protein: 6.4 g/dL — ABNORMAL LOW (ref 6.5–8.1)

## 2020-05-10 LAB — CBC
HCT: 30.4 % — ABNORMAL LOW (ref 39.0–52.0)
Hemoglobin: 9.8 g/dL — ABNORMAL LOW (ref 13.0–17.0)
MCH: 30.2 pg (ref 26.0–34.0)
MCHC: 32.2 g/dL (ref 30.0–36.0)
MCV: 93.5 fL (ref 80.0–100.0)
Platelets: 260 10*3/uL (ref 150–400)
RBC: 3.25 MIL/uL — ABNORMAL LOW (ref 4.22–5.81)
RDW: 13.8 % (ref 11.5–15.5)
WBC: 11.6 10*3/uL — ABNORMAL HIGH (ref 4.0–10.5)
nRBC: 0 % (ref 0.0–0.2)

## 2020-05-10 LAB — LIPASE, BLOOD: Lipase: 217 U/L — ABNORMAL HIGH (ref 11–51)

## 2020-05-10 SURGERY — LAPAROSCOPIC CHOLECYSTECTOMY WITH INTRAOPERATIVE CHOLANGIOGRAM
Anesthesia: General | Site: Abdomen

## 2020-05-10 MED ORDER — FENTANYL CITRATE (PF) 100 MCG/2ML IJ SOLN
25.0000 ug | INTRAMUSCULAR | Status: DC | PRN
  Administered 2020-05-10: 25 ug via INTRAVENOUS

## 2020-05-10 MED ORDER — LACTATED RINGERS IV SOLN: INTRAVENOUS | Status: DC | PRN

## 2020-05-10 MED ORDER — CEFAZOLIN SODIUM-DEXTROSE 2-3 GM-%(50ML) IV SOLR
INTRAVENOUS | Status: DC | PRN
  Administered 2020-05-10: 2 g via INTRAVENOUS

## 2020-05-10 MED ORDER — ONDANSETRON HCL 4 MG/2ML IJ SOLN
INTRAMUSCULAR | Status: AC
  Administered 2020-05-10: 4 mg via INTRAVENOUS
  Filled 2020-05-10: qty 2

## 2020-05-10 MED ORDER — BOOST / RESOURCE BREEZE PO LIQD CUSTOM
1.0000 | Freq: Three times a day (TID) | ORAL | Status: DC
  Administered 2020-05-10 – 2020-05-12 (×4): 1 via ORAL

## 2020-05-10 MED ORDER — IOHEXOL 300 MG/ML  SOLN
INTRAMUSCULAR | Status: DC | PRN
  Administered 2020-05-10: 8.5 mL

## 2020-05-10 MED ORDER — EPHEDRINE 5 MG/ML INJ
INTRAVENOUS | Status: AC
  Filled 2020-05-10: qty 10

## 2020-05-10 MED ORDER — 0.9 % SODIUM CHLORIDE (POUR BTL) OPTIME
TOPICAL | Status: DC | PRN
  Administered 2020-05-10: 1000 mL

## 2020-05-10 MED ORDER — FENTANYL CITRATE (PF) 100 MCG/2ML IJ SOLN
12.5000 ug | INTRAMUSCULAR | Status: DC | PRN
  Administered 2020-05-10 (×2): 25 ug via INTRAVENOUS
  Filled 2020-05-10 (×2): qty 2

## 2020-05-10 MED ORDER — PHENYLEPHRINE HCL-NACL 10-0.9 MG/250ML-% IV SOLN
INTRAVENOUS | Status: DC | PRN
  Administered 2020-05-10: 40 ug/min via INTRAVENOUS

## 2020-05-10 MED ORDER — LIDOCAINE 2% (20 MG/ML) 5 ML SYRINGE
INTRAMUSCULAR | Status: DC | PRN
  Administered 2020-05-10: 60 mg via INTRAVENOUS
  Administered 2020-05-10: 40 mg via INTRAVENOUS

## 2020-05-10 MED ORDER — FENTANYL CITRATE (PF) 100 MCG/2ML IJ SOLN
INTRAMUSCULAR | Status: DC | PRN
  Administered 2020-05-10: 100 ug via INTRAVENOUS

## 2020-05-10 MED ORDER — OXYCODONE HCL 5 MG PO TABS: 5.0000 mg | ORAL_TABLET | Freq: Once | ORAL | Status: DC | PRN

## 2020-05-10 MED ORDER — EPHEDRINE SULFATE-NACL 50-0.9 MG/10ML-% IV SOSY
PREFILLED_SYRINGE | INTRAVENOUS | Status: DC | PRN
  Administered 2020-05-10: 5 mg via INTRAVENOUS

## 2020-05-10 MED ORDER — SUGAMMADEX SODIUM 200 MG/2ML IV SOLN
INTRAVENOUS | Status: DC | PRN
  Administered 2020-05-10: 200 mg via INTRAVENOUS

## 2020-05-10 MED ORDER — OXYCODONE HCL 5 MG PO TABS
5.0000 mg | ORAL_TABLET | ORAL | Status: DC | PRN
  Administered 2020-05-10 (×2): 5 mg via ORAL
  Filled 2020-05-10 (×2): qty 1

## 2020-05-10 MED ORDER — ACETAMINOPHEN 160 MG/5ML PO SOLN: 325.0000 mg | ORAL | Status: DC | PRN

## 2020-05-10 MED ORDER — ROCURONIUM BROMIDE 10 MG/ML (PF) SYRINGE
PREFILLED_SYRINGE | INTRAVENOUS | Status: DC | PRN
  Administered 2020-05-10: 50 mg via INTRAVENOUS

## 2020-05-10 MED ORDER — LACTATED RINGERS IR SOLN
Status: DC | PRN
  Administered 2020-05-10: 1000 mL

## 2020-05-10 MED ORDER — ONDANSETRON HCL 4 MG/2ML IJ SOLN
INTRAMUSCULAR | Status: DC | PRN
  Administered 2020-05-10: 4 mg via INTRAVENOUS

## 2020-05-10 MED ORDER — FENTANYL CITRATE (PF) 100 MCG/2ML IJ SOLN
INTRAMUSCULAR | Status: AC
  Filled 2020-05-10: qty 2

## 2020-05-10 MED ORDER — ACETAMINOPHEN 325 MG PO TABS: 650.0000 mg | ORAL_TABLET | Freq: Four times a day (QID) | ORAL | Status: DC | PRN

## 2020-05-10 MED ORDER — BUPIVACAINE HCL (PF) 0.5 % IJ SOLN
INTRAMUSCULAR | Status: AC
  Filled 2020-05-10: qty 30

## 2020-05-10 MED ORDER — ACETAMINOPHEN 325 MG PO TABS
325.0000 mg | ORAL_TABLET | ORAL | Status: DC | PRN
Start: 2020-05-10 — End: 2020-05-10

## 2020-05-10 MED ORDER — PROPOFOL 10 MG/ML IV BOLUS
INTRAVENOUS | Status: DC | PRN
  Administered 2020-05-10: 100 mg via INTRAVENOUS

## 2020-05-10 MED ORDER — OXYCODONE HCL 5 MG/5ML PO SOLN: 5.0000 mg | Freq: Once | ORAL | Status: DC | PRN

## 2020-05-10 MED ORDER — CEFAZOLIN SODIUM-DEXTROSE 2-4 GM/100ML-% IV SOLN
INTRAVENOUS | Status: AC
  Filled 2020-05-10: qty 100

## 2020-05-10 MED ORDER — BUPIVACAINE HCL (PF) 0.5 % IJ SOLN
INTRAMUSCULAR | Status: DC | PRN
  Administered 2020-05-10: 20 mL

## 2020-05-10 MED ORDER — ONDANSETRON HCL 4 MG/2ML IJ SOLN: 4.0000 mg | Freq: Once | INTRAMUSCULAR | Status: AC | PRN

## 2020-05-10 MED ORDER — MEPERIDINE HCL 50 MG/ML IJ SOLN: 6.2500 mg | INTRAMUSCULAR | Status: DC | PRN

## 2020-05-10 SURGICAL SUPPLY — 34 items
ADH SKN CLS APL DERMABOND .7 (GAUZE/BANDAGES/DRESSINGS) ×1
APL PRP STRL LF DISP 70% ISPRP (MISCELLANEOUS) ×1
APPLIER CLIP 5 13 M/L LIGAMAX5 (MISCELLANEOUS) ×2
APR CLP MED LRG 5 ANG JAW (MISCELLANEOUS) ×1
BAG SPEC RTRVL LRG 6X4 10 (ENDOMECHANICALS) ×1
CABLE HIGH FREQUENCY MONO STRZ (ELECTRODE) ×2 IMPLANT
CHLORAPREP W/TINT 26 (MISCELLANEOUS) ×2 IMPLANT
CLIP APPLIE 5 13 M/L LIGAMAX5 (MISCELLANEOUS) ×1 IMPLANT
COVER MAYO STAND STRL (DRAPES) ×1 IMPLANT
COVER WAND RF STERILE (DRAPES) IMPLANT
DECANTER SPIKE VIAL GLASS SM (MISCELLANEOUS) ×2 IMPLANT
DERMABOND ADVANCED (GAUZE/BANDAGES/DRESSINGS) ×1
DERMABOND ADVANCED .7 DNX12 (GAUZE/BANDAGES/DRESSINGS) ×1 IMPLANT
DRAPE C-ARM 42X120 X-RAY (DRAPES) ×1 IMPLANT
ELECT REM PT RETURN 15FT ADLT (MISCELLANEOUS) ×2 IMPLANT
GLOVE BIO SURGEON STRL SZ7.5 (GLOVE) ×2 IMPLANT
GOWN STRL REUS W/TWL XL LVL3 (GOWN DISPOSABLE) ×4 IMPLANT
HEMOSTAT SNOW SURGICEL 2X4 (HEMOSTASIS) ×1 IMPLANT
HEMOSTAT SURGICEL 4X8 (HEMOSTASIS) IMPLANT
KIT BASIN OR (CUSTOM PROCEDURE TRAY) ×2 IMPLANT
KIT TURNOVER KIT A (KITS) ×1 IMPLANT
PENCIL SMOKE EVACUATOR (MISCELLANEOUS) IMPLANT
POUCH SPECIMEN RETRIEVAL 10MM (ENDOMECHANICALS) ×2 IMPLANT
SCISSORS LAP 5X35 DISP (ENDOMECHANICALS) ×2 IMPLANT
SET CHOLANGIOGRAPH MIX (MISCELLANEOUS) ×1 IMPLANT
SET IRRIG TUBING LAPAROSCOPIC (IRRIGATION / IRRIGATOR) ×2 IMPLANT
SET TUBE SMOKE EVAC HIGH FLOW (TUBING) ×2 IMPLANT
SLEEVE XCEL OPT CAN 5 100 (ENDOMECHANICALS) ×4 IMPLANT
SUT MNCRL AB 4-0 PS2 18 (SUTURE) ×2 IMPLANT
TOWEL OR 17X26 10 PK STRL BLUE (TOWEL DISPOSABLE) ×2 IMPLANT
TOWEL OR NON WOVEN STRL DISP B (DISPOSABLE) ×2 IMPLANT
TRAY LAPAROSCOPIC (CUSTOM PROCEDURE TRAY) ×2 IMPLANT
TROCAR BLADELESS OPT 5 100 (ENDOMECHANICALS) ×2 IMPLANT
TROCAR XCEL BLUNT TIP 100MML (ENDOMECHANICALS) ×2 IMPLANT

## 2020-05-10 NOTE — Anesthesia Preprocedure Evaluation (Addendum)
Anesthesia Evaluation  Patient identified by MRN, date of birth, ID band Patient awake    Reviewed: Allergy & Precautions, H&P , NPO status , Patient's Chart, lab work & pertinent test results, reviewed documented beta blocker date and time   Airway Mallampati: I  TM Distance: >3 FB Neck ROM: full    Dental no notable dental hx. (+) Chipped, Missing, Dental Advisory Given,    Pulmonary sleep apnea ,    Pulmonary exam normal breath sounds clear to auscultation       Cardiovascular Exercise Tolerance: Good + CAD, + CABG and +CHF  + dysrhythmias  Rhythm:regular Rate:Normal  ECHO 12/16 udy Conclusions   - Left ventricle: The cavity size was normal. Wall thickness was  normal. Systolic function was mildly reduced. The estimated  ejection fraction was in the range of 45% to 50%. Mild diffuse  hypokinesis with no identifiable regional variations. Doppler  parameters are consistent with abnormal left ventricular  relaxation (grade 1 diastolic dysfunction).  - Ventricular septum: Septal motion showed paradox. These changes  are consistent with a left bundle branch block.  - Mitral valve: There was mild regurgitation.    Neuro/Psych PSYCHIATRIC DISORDERS  Neuromuscular disease    GI/Hepatic Neg liver ROS, GERD  ,  Endo/Other  Hypothyroidism   Renal/GU CRFRenal disease  negative genitourinary   Musculoskeletal  (+) Arthritis , Osteoarthritis,    Abdominal   Peds  Hematology  (+) Blood dyscrasia, anemia ,   Anesthesia Other Findings   Reproductive/Obstetrics negative OB ROS                            Anesthesia Physical Anesthesia Plan  ASA: III  Anesthesia Plan: General   Post-op Pain Management:    Induction: Intravenous  PONV Risk Score and Plan:   Airway Management Planned: Oral ETT  Additional Equipment:   Intra-op Plan:   Post-operative Plan: Extubation in  OR  Informed Consent: I have reviewed the patients History and Physical, chart, labs and discussed the procedure including the risks, benefits and alternatives for the proposed anesthesia with the patient or authorized representative who has indicated his/her understanding and acceptance.     Dental Advisory Given  Plan Discussed with: CRNA  Anesthesia Plan Comments: (  )        Anesthesia Quick Evaluation

## 2020-05-10 NOTE — Anesthesia Postprocedure Evaluation (Signed)
Anesthesia Post Note  Patient: Gary Gonzalez  Procedure(s) Performed: LAPAROSCOPIC CHOLECYSTECTOMY WITH INTRAOPERATIVE CHOLANGIOGRAM (N/A Abdomen)     Patient location during evaluation: PACU Anesthesia Type: General Level of consciousness: awake and alert Pain management: pain level controlled Vital Signs Assessment: post-procedure vital signs reviewed and stable Respiratory status: spontaneous breathing, nonlabored ventilation, respiratory function stable and patient connected to nasal cannula oxygen Cardiovascular status: blood pressure returned to baseline and stable Postop Assessment: no apparent nausea or vomiting Anesthetic complications: no   No complications documented.  Last Vitals:  Vitals:   05/10/20 1203 05/10/20 1215  BP:  (!) 165/68  Pulse: 67 68  Resp: (!) 25 (!) 25  Temp: 36.5 C   SpO2: 96% 100%    Last Pain:  Vitals:   05/10/20 1203  TempSrc:   PainSc: Asleep                 Durk Carmen

## 2020-05-10 NOTE — Consult Note (Signed)
Reason for Consult: Positive Intra-Op cholangiogram Referring Physician: Surgical team  Gary Gonzalez is an 87 y.o. male.  HPI: Patient seen and examined in hospital computer chart reviewed and patient's case discussed with his daughter as well and he has not had much GI problems in his entire life until he had 1 to 2 days of significant abdominal pain and presented with gallstone pancreatitis which was short-lived and underwent lap chole today with a abnormal cholangiogram we are consulted for considerations of ERCP  Past Medical History:  Diagnosis Date  . Anemia   . Arthritis    "touch in the fingers" (11/17/2014)  . Chronic combined systolic and diastolic CHF (congestive heart failure) ([place])    a. 11/17/14 showed EF 25-30%, suboptimal image quality, diffuse hypokinesis worse in the inferior wall, mild to moderate MR. New drop EF compared to 2012 but cath was stable.  . CKD (chronic kidney disease), stage III ([employer])   . Coronary artery disease    a. s/p 2v CABG (seq LIMA to Diag and LAD, SVG to OM) on 05/15/2001.  . Fibromyalgia    "a long time ago" (11/17/2014)  . History of gout   . Hyperlipidemia   . Hypothyroidism   . LBBB (left bundle branch block)   . Melanoma of back ([place])   . NSVT (nonsustained ventricular tachycardia) ([place])   . PMR (polymyalgia rheumatica) ([facility])   . PVC's (premature ventricular contractions)   . Sleep apnea    "wife says I do" (11/18/2014)    Past Surgical History:  Procedure Laterality Date  . AXILLARY LYMPH NODE DISSECTION Bilateral 2001  . CARDIAC CATHETERIZATION  04/28/2001  . CARDIAC CATHETERIZATION N/A 11/19/2014   Procedure: Right/Left Heart Cath and Coronary/Graft Angiography;  Surgeon: Jettie Booze, MD;  Location: [facility];  Service: Cardiovascular;  Laterality: N/A;  . CATARACT EXTRACTION W/ INTRAOCULAR LENS  IMPLANT, BILATERAL Bilateral ~ 2010  . CORONARY ARTERY BYPASS GRAFT  05/15/2001   x3 sequential LIMA to diag and LAD, SVG to  OM   . MELANOMA EXCISION  2001   off of back    Family History  Problem Relation Age of Onset  . Prostate cancer Father   . Liver cancer Mother   . Hyperlipidemia Son   . Hyperlipidemia Daughter     Social History:  reports that he has never smoked. He has never used smokeless tobacco. He reports current alcohol use of about 6.0 standard drinks of alcohol per week. He reports that he does not use drugs.  Allergies:  Allergies  Allergen Reactions  . Atenolol Diarrhea and Other (See Comments)    Very weak, fatigued, lethargic, no appetite    Medications: I have reviewed the patient's current medications.  Results for orders placed or performed during the hospital encounter of 05/08/20 (from the past 48 hour(s))  SARS CORONAVIRUS 2 (TAT 6-24 HRS) Nasopharyngeal Nasopharyngeal Swab     Status: None   Collection Time: 05/08/20  5:27 PM   Specimen: Nasopharyngeal Swab  Result Value Ref Range   SARS Coronavirus 2 NEGATIVE NEGATIVE    Comment: (NOTE) SARS-CoV-2 target nucleic acids are NOT DETECTED.  The SARS-CoV-2 RNA is generally detectable in upper and lower respiratory specimens during the acute phase of infection. Negative results do not preclude SARS-CoV-2 infection, do not rule out co-infections with other pathogens, and should not be used as the sole basis for treatment or other patient management decisions. Negative results must be combined with clinical observations, patient  history, and epidemiological information. The expected result is Negative.  Fact Sheet for Patients: SugarRoll.be  Fact Sheet for Healthcare Providers: https://www.woods-mathews.com/  This test is not yet approved or cleared by the [place] FDA and  has been authorized for detection and/or diagnosis of SARS-CoV-2 by FDA under an Emergency Use Authorization (EUA). This EUA will remain  in effect (meaning this test can be used) for the duration of  the COVID-19 declaration under Se ction 564(b)(1) of the Act, 21 U.S.C. section 360bbb-3(b)(1), unless the authorization is terminated or revoked sooner.  Performed at [facility], [place] [address] [place], [place] [address]   Surgical pcr screen     Status: None   Collection Time: 05/09/20  4:53 AM   Specimen: Nasal Mucosa; Nasal Swab  Result Value Ref Range   MRSA, PCR NEGATIVE NEGATIVE   Staphylococcus aureus NEGATIVE NEGATIVE    Comment: (NOTE) The Xpert SA Assay (FDA approved for NASAL specimens in patients 74 years of age and older), is one component of a comprehensive surveillance program. It is not intended to diagnose infection nor to guide or monitor treatment. Performed at [facility], [place] [address], [place], [place] [address]   CBC     Status: Abnormal   Collection Time: 05/09/20  8:09 AM  Result Value Ref Range   WBC 12.7 (H) 4.0 - 10.5 K/uL   RBC 3.54 (L) 4.22 - 5.81 MIL/uL   Hemoglobin 10.8 (L) 13.0 - 17.0 g/dL   HCT 32.9 (L) 39.0 - 52.0 %   MCV 92.9 80.0 - 100.0 fL   MCH 30.5 26.0 - 34.0 pg   MCHC 32.8 30.0 - 36.0 g/dL   RDW 13.5 11.5 - 15.5 %   Platelets 266 150 - 400 K/uL   nRBC 0.0 0.0 - 0.2 %    Comment: Performed at [facility], [place] [address] [place], [place] [address]  Comprehensive metabolic panel     Status: Abnormal   Collection Time: 05/09/20  8:09 AM  Result Value Ref Range   Sodium 138 135 - 145 mmol/L   Potassium 4.4 3.5 - 5.1 mmol/L   Chloride 105 98 - 111 mmol/L   CO2 25 22 - 32 mmol/L   Glucose, Bld 111 (H) 70 - 99 mg/dL    Comment: Glucose reference range applies only to samples taken after fasting for at least 8 hours.   BUN 16 8 - 23 mg/dL   Creatinine, Ser 1.18 0.61 - 1.24 mg/dL   Calcium 8.4 (L) 8.9 - 10.3 mg/dL   Total Protein 6.3 (L) 6.5 - 8.1 g/dL   Albumin 3.1 (L) 3.5 - 5.0 g/dL   AST 322 (H) 15 - 41 U/L   ALT 538 (H) 0 - 44 U/L   Alkaline Phosphatase 401 (H) 38 - 126 U/L    Total Bilirubin 1.7 (H) 0.3 - 1.2 mg/dL   GFR, Estimated 58 (L) >60 mL/min    Comment: (NOTE) Calculated using the CKD-EPI Creatinine Equation (2021)    Anion gap 8 5 - 15    Comment: Performed at [facility], [place] [address] [place], [place] [address]  Lipase, blood     Status: Abnormal   Collection Time: 05/09/20  8:09 AM  Result Value Ref Range   Lipase 946 (H) 11 - 51 U/L    Comment: RESULTS CONFIRMED BY MANUAL DILUTION Performed at [facility], [place] [address], [place], [place] [address]  Glucose, capillary     Status: Abnormal   Collection Time: 05/09/20  9:35 PM  Result Value Ref Range   Glucose-Capillary 117 (H) 70 - 99 mg/dL    Comment: Glucose reference range applies only to samples taken after fasting for at least 8 hours.  CBC     Status: Abnormal   Collection Time: 05/10/20  2:20 AM  Result Value Ref Range   WBC 11.6 (H) 4.0 - 10.5 K/uL   RBC 3.25 (L) 4.22 - 5.81 MIL/uL   Hemoglobin 9.8 (L) 13.0 - 17.0 g/dL   HCT 30.4 (L) 39.0 - 52.0 %   MCV 93.5 80.0 - 100.0 fL   MCH 30.2 26.0 - 34.0 pg   MCHC 32.2 30.0 - 36.0 g/dL   RDW 13.8 11.5 - 15.5 %   Platelets 260 150 - 400 K/uL   nRBC 0.0 0.0 - 0.2 %    Comment: Performed at [facility], [place] [address] [place], [place] [address]  Comprehensive metabolic panel     Status: Abnormal   Collection Time: 05/10/20  2:20 AM  Result Value Ref Range   Sodium 138 135 - 145 mmol/L   Potassium 3.7 3.5 - 5.1 mmol/L   Chloride 105 98 - 111 mmol/L   CO2 24 22 - 32 mmol/L   Glucose, Bld 97 70 - 99 mg/dL    Comment: Glucose reference range applies only to samples taken after fasting for at least 8 hours.   BUN 19 8 - 23 mg/dL   Creatinine, Ser 1.18 0.61 - 1.24 mg/dL   Calcium 8.3 (L) 8.9 - 10.3 mg/dL   Total Protein 6.4 (L) 6.5 - 8.1 g/dL   Albumin 3.0 (L) 3.5 - 5.0 g/dL   AST 189 (H) 15 - 41 U/L   ALT 434 (H) 0 - 44 U/L   Alkaline Phosphatase 370 (H) 38 - 126 U/L    Total Bilirubin 1.2 0.3 - 1.2 mg/dL   GFR, Estimated 58 (L) >60 mL/min    Comment: (NOTE) Calculated using the CKD-EPI Creatinine Equation (2021)    Anion gap 9 5 - 15    Comment: Performed at [facility], [place] [address] [place], [place] [address]  Lipase, blood     Status: Abnormal   Collection Time: 05/10/20  2:20 AM  Result Value Ref Range   Lipase 217 (H) 11 - 51 U/L    Comment: Performed at [facility], [place] [address], [place],  [address]    DG Cholangiogram Operative  Result Date: 05/10/2020 CLINICAL DATA:  Cholelithiasis. EXAM: INTRAOPERATIVE CHOLANGIOGRAM TECHNIQUE: Cholangiographic images from the C-arm fluoroscopic device were submitted for interpretation post-operatively. Please see the procedural report for the amount of contrast and the fluoroscopy time utilized. COMPARISON:  None. FINDINGS: Submitted images demonstrate opacification of the biliary tree through cystic duct remnant. There is partial opacification of the pancreatic duct the she sees he is to without significant abnormality. Multiple small filling defects seen within the distal common bile duct most likely related to calculi. There is flow of contrast into the duodenum. IMPRESSION: Intraoperative cholangiogram as above. Electronically Signed   By: Miachel Roux M.D.   On: 05/10/2020 11:07   MR 3D Recon At Scanner  Result Date: 05/09/2020 CLINICAL DATA:  Abdominal pain cholelithiasis, dilated CBD EXAM: MRI ABDOMEN WITHOUT AND WITH CONTRAST (INCLUDING MRCP) TECHNIQUE: Multiplanar multisequence MR imaging of the abdomen was performed both before and after the administration of intravenous contrast. Heavily  T2-weighted images of the biliary and pancreatic ducts were obtained, and three-dimensional MRCP images were rendered by post processing. CONTRAST:  30mL GADAVIST GADOBUTROL 1 MMOL/ML IV SOLN COMPARISON:  Right upper quadrant ultrasound dated 05/08/2020. CT abdomen dated  02/26/2020 FINDINGS: Motion degraded images. Lower chest: Lung bases are clear. Hepatobiliary: No morphologic findings of cirrhosis. No hepatic steatosis. Mildly heterogeneous early arterial perfusion (series 22/image 37) which normalizes on later phases. No suspicious/enhancing hepatic lesions. Layering gallstones (series 16/image 23), with very mild gallbladder wall thickening/edema (series 3/image 23). No intrahepatic ductal dilatation. Mildly dilated common duct, measuring 10 mm (series 5/image 14), unchanged from prior CT. No choledocholithiasis is seen. Pancreas:  Within normal limits. Spleen:  Within normal limits. Adrenals/Urinary Tract:  Adrenal glands are within normal limits. Tiny bilateral renal cysts measuring up to 5 mm (series 5/image 18 and 22). No hydronephrosis. Stomach/Bowel: Stomach is within normal limits. Visualized bowel is grossly unremarkable. Vascular/Lymphatic:  No evidence of abdominal aortic aneurysm. No suspicious abdominal lymphadenopathy. Other:  No abdominal ascites. Musculoskeletal: No focal osseous lesions. Mild degenerative changes of the lumbar spine with levoscoliosis. IMPRESSION: Motion degraded images. No intrahepatic ductal dilatation. Mildly dilated common duct, measuring 10 mm, unchanged from prior CT. No choledocholithiasis is seen. In the setting of abnormal LFTs, recent passage a common duct stone is possible. Given the degree of motion degradation, nonvisualized distal CBD stone or stricture is also possible, although these are not favored given the relative stability from prior CT. Cholelithiasis with very mild gallbladder wall thickening/edema. While very early acute cholecystitis is difficult to exclude, this appearance may be secondary to a right upper quadrant inflammatory process such as hepatitis. Consider follow-up right upper quadrant ultrasound or hepatobiliary nuclear medicine scan if symptoms persist. Electronically Signed   By: Julian Hy M.D.   On:  05/09/2020 12:00   MR ABDOMEN MRCP W WO CONTAST  Result Date: 05/09/2020 CLINICAL DATA:  Abdominal pain cholelithiasis, dilated CBD EXAM: MRI ABDOMEN WITHOUT AND WITH CONTRAST (INCLUDING MRCP) TECHNIQUE: Multiplanar multisequence MR imaging of the abdomen was performed both before and after the administration of intravenous contrast. Heavily T2-weighted images of the biliary and pancreatic ducts were obtained, and three-dimensional MRCP images were rendered by post processing. CONTRAST:  63mL GADAVIST GADOBUTROL 1 MMOL/ML IV SOLN COMPARISON:  Right upper quadrant ultrasound dated 05/08/2020. CT abdomen dated 02/26/2020 FINDINGS: Motion degraded images. Lower chest: Lung bases are clear. Hepatobiliary: No morphologic findings of cirrhosis. No hepatic steatosis. Mildly heterogeneous early arterial perfusion (series 22/image 37) which normalizes on later phases. No suspicious/enhancing hepatic lesions. Layering gallstones (series 16/image 23), with very mild gallbladder wall thickening/edema (series 3/image 23). No intrahepatic ductal dilatation. Mildly dilated common duct, measuring 10 mm (series 5/image 14), unchanged from prior CT. No choledocholithiasis is seen. Pancreas:  Within normal limits. Spleen:  Within normal limits. Adrenals/Urinary Tract:  Adrenal glands are within normal limits. Tiny bilateral renal cysts measuring up to 5 mm (series 5/image 18 and 22). No hydronephrosis. Stomach/Bowel: Stomach is within normal limits. Visualized bowel is grossly unremarkable. Vascular/Lymphatic:  No evidence of abdominal aortic aneurysm. No suspicious abdominal lymphadenopathy. Other:  No abdominal ascites. Musculoskeletal: No focal osseous lesions. Mild degenerative changes of the lumbar spine with levoscoliosis. IMPRESSION: Motion degraded images. No intrahepatic ductal dilatation. Mildly dilated common duct, measuring 10 mm, unchanged from prior CT. No choledocholithiasis is seen. In the setting of abnormal LFTs,  recent passage a common duct stone is possible. Given the degree of motion degradation, nonvisualized distal CBD stone  or stricture is also possible, although these are not favored given the relative stability from prior CT. Cholelithiasis with very mild gallbladder wall thickening/edema. While very early acute cholecystitis is difficult to exclude, this appearance may be secondary to a right upper quadrant inflammatory process such as hepatitis. Consider follow-up right upper quadrant ultrasound or hepatobiliary nuclear medicine scan if symptoms persist. Electronically Signed   By: Julian Hy M.D.   On: 05/09/2020 12:00   US Abdomen Limited RUQ (LIVER/GB)  Result Date: 05/08/2020 CLINICAL DATA:  Right upper quadrant pain EXAM: ULTRASOUND ABDOMEN LIMITED RIGHT UPPER QUADRANT COMPARISON:  None. FINDINGS: Gallbladder: Multiple stones are seen in the gallbladder. No wall thickening, pericholecystic fluid, or Murphy's sign. Common bile duct: Diameter: 6 mm Liver: No focal lesion identified. Within normal limits in parenchymal echogenicity. Portal vein is patent on color Doppler imaging with normal direction of blood flow towards the liver. Other: None. IMPRESSION: 1. Cholelithiasis without wall thickening, pericholecystic fluid, or Murphy's sign. If the clinical picture remains ambiguous, recommend HIDA scan. 2. The common bile duct is borderline measuring 6.3 mm. Recommend correlation with clinical picture and labs. If there is concern for biliary obstruction, recommend MRCP or ERCP. Electronically Signed   By: Dorise Bullion III M.D   On: 05/08/2020 16:30    Review of Systems negative except above he is doing great currently postop Blood pressure (!) 156/74, pulse 90, temperature 97.6 F (36.4 C), temperature source Oral, resp. rate 18, height 6' (1.829 m), weight 66 kg, SpO2 100 %. Physical Exam patient not examined today looks well will be prior to procedure tomorrow labs MRCP and Intra-Op  cholangiogram reviewed as well as previous CT  Assessment/Plan: Resolved gallstone pancreatitis and patient with abnormal Intra-Op cholangiogram Plan: The risk benefits methods and success rate of ERCP was discussed with the patient including stenting methods of stone removal etc. and we answered all of their questions we will proceed tomorrow with further work-up and plans pending those findings  [place] E 05/10/2020, 3:44 PM

## 2020-05-10 NOTE — Progress Notes (Signed)
PROGRESS NOTE    Gary Gonzalez  JKK:[id number] DOB: [birth year] DOA: 05/08/2020 PCP: [name], MD   Chief Complain: Abdominal pain  Brief Narrative: Patient is a 87 year old male with history of coronary disease, PVC induced cardiomyopathy, hypothyroidism who initially presented with abdomen pain, nausea.  On presentation he was found to have elevated liver enzymes, severe elevated lipase.  Right upper quadrant ultrasound showed cholelithiasis.  He was admitted under general surgery service.  Planning for lap cholecystectomy with IOC.  TRH consulted for medical management.  Assessment & Plan:   Principal Problem:   Acute cholecystitis Active Problems:   CAD (coronary artery disease)   Hypothyroidism (acquired)   Acute cholecystitis:   Elevated LFTs, lipase.  MRCP showed mildly dilated CBD with size of 10 mm, no choledocholithiasis, cholelithiasis with mild gallbladder wall thickening.  General surgery planning for lap cholecystectomy with IOC today. liver enzymes, lipase improving.  Mild leukocytosis present.  Management as per general surgery  Coronary artery disease: Status post CABG.  Denies any chest pain.  On Imdur, aspirin at home.  Follows with cardiology.  No further work-up planned at this admission.  History of PVC induced cardiomyopathy:Currently stable.  Follows with cardiology.  Continue current medications  Hypothyroidism: Continue Synthyroid  TRH will continue to follow         DVT prophylaxis:Lovenox Code Status: Full    Antimicrobials:  Anti-infectives (From admission, onward)   Start     Dose/Rate Route Frequency Ordered Stop   05/08/20 2030  cefTRIAXone (ROCEPHIN) 2 g in sodium chloride 0.9 % 100 mL IVPB        2 g 200 mL/hr over 30 Minutes Intravenous Every 24 hours 05/08/20 2017        Subjective: Patient seen and examined at bedside this morning.  Hemodynamically stable.  Denies any abdominal pain, nausea or vomiting.  Waiting for  surgery  Objective: Vitals:   05/09/20 1339 05/09/20 2129 05/10/20 0209 05/10/20 0535  BP: 127/64 (!) 100/43 111/80 (!) 120/48  Pulse: (!) 101 81 67 71  Resp: 20 18 17 17   Temp:  98.4 F (36.9 C) 97.8 F (36.6 C) 97.6 F (36.4 C)  TempSrc:  Oral Oral Oral  SpO2: 98% 97% 96% 100%  Weight:      Height:        Intake/Output Summary (Last 24 hours) at 05/10/2020 0818 Last data filed at 05/10/2020 0600 Gross per 24 hour  Intake 1825.91 ml  Output 650 ml  Net 1175.91 ml   Filed Weights   05/08/20 1532  Weight: 66 kg    Examination:  General exam: Appears calm and comfortable ,Not in distress,average built HEENT:PERRL,Oral mucosa moist, Ear/Nose normal on gross exam Respiratory system: Bilateral equal air entry, normal vesicular breath sounds, no wheezes or crackles  Cardiovascular system: S1 & S2 heard, RRR. No JVD, murmurs, rubs, gallops or clicks. Gastrointestinal system: Abdomen is nondistended, soft and nontender. No organomegaly or masses felt. Normal bowel sounds heard. Central nervous system: Alert and oriented. No focal neurological deficits. Extremities: No edema, no clubbing ,no cyanosis Skin: No rashes, lesions or ulcers,no icterus ,no pallor   Data Reviewed: I have personally reviewed following labs and imaging studies  CBC: Recent Labs  Lab 05/08/20 1539 05/09/20 0809 05/10/20 0220  WBC 10.5 12.7* 11.6*  NEUTROABS 8.8*  --   --   HGB 12.3* 10.8* 9.8*  HCT 37.4* 32.9* 30.4*  MCV 91.4 92.9 93.5  PLT 276 266 716   Basic Metabolic  Panel: Recent Labs  Lab 05/08/20 1539 05/09/20 0809 05/10/20 0220  NA 134* 138 138  K 4.2 4.4 3.7  CL 101 105 105  CO2 22 25 24   GLUCOSE 133* 111* 97  BUN 19 16 19   CREATININE 1.09 1.18 1.18  CALCIUM 8.9 8.4* 8.3*   GFR: Estimated Creatinine Clearance: 38.1 mL/min (by C-G formula based on SCr of 1.18 mg/dL). Liver Function Tests: Recent Labs  Lab 05/08/20 1539 05/09/20 0809 05/10/20 0220  AST 521* 322* 189*   ALT 716* 538* 434*  ALKPHOS 409* 401* 370*  BILITOT 3.3* 1.7* 1.2  PROT 6.8 6.3* 6.4*  ALBUMIN 3.4* 3.1* 3.0*   Recent Labs  Lab 05/08/20 1539 05/09/20 0809 05/10/20 0220  LIPASE >10,000* 946* 217*   No results for input(s): AMMONIA in the last 168 hours. Coagulation Profile: Recent Labs  Lab 05/08/20 1539  INR 1.0   Cardiac Enzymes: No results for input(s): CKTOTAL, CKMB, CKMBINDEX, TROPONINI in the last 168 hours. BNP (last 3 results) No results for input(s): PROBNP in the last 8760 hours. HbA1C: No results for input(s): HGBA1C in the last 72 hours. CBG: Recent Labs  Lab 05/09/20 2135  GLUCAP 117*   Lipid Profile: No results for input(s): CHOL, HDL, LDLCALC, TRIG, CHOLHDL, LDLDIRECT in the last 72 hours. Thyroid Function Tests: No results for input(s): TSH, T4TOTAL, FREET4, T3FREE, THYROIDAB in the last 72 hours. Anemia Panel: No results for input(s): VITAMINB12, FOLATE, FERRITIN, TIBC, IRON, RETICCTPCT in the last 72 hours. Sepsis Labs: No results for input(s): PROCALCITON, LATICACIDVEN in the last 168 hours.  Recent Results (from the past 240 hour(s))  SARS CORONAVIRUS 2 (TAT 6-24 HRS) Nasopharyngeal Nasopharyngeal Swab     Status: None   Collection Time: 05/08/20  5:27 PM   Specimen: Nasopharyngeal Swab  Result Value Ref Range Status   SARS Coronavirus 2 NEGATIVE NEGATIVE Final    Comment: (NOTE) SARS-CoV-2 target nucleic acids are NOT DETECTED.  The SARS-CoV-2 RNA is generally detectable in upper and lower respiratory specimens during the acute phase of infection. Negative results do not preclude SARS-CoV-2 infection, do not rule out co-infections with other pathogens, and should not be used as the sole basis for treatment or other patient management decisions. Negative results must be combined with clinical observations, patient history, and epidemiological information. The expected result is Negative.  Fact Sheet for  Patients: SugarRoll.be  Fact Sheet for Healthcare Providers: https://www.woods-mathews.com/  This test is not yet approved or cleared by the [place] FDA and  has been authorized for detection and/or diagnosis of SARS-CoV-2 by FDA under an Emergency Use Authorization (EUA). This EUA will remain  in effect (meaning this test can be used) for the duration of the COVID-19 declaration under Se ction 564(b)(1) of the Act, 21 U.S.C. section 360bbb-3(b)(1), unless the authorization is terminated or revoked sooner.  Performed at [facility], [place] [address], [place], [place] [address]   Surgical pcr screen     Status: None   Collection Time: 05/09/20  4:53 AM   Specimen: Nasal Mucosa; Nasal Swab  Result Value Ref Range Status   MRSA, PCR NEGATIVE NEGATIVE Final   Staphylococcus aureus NEGATIVE NEGATIVE Final    Comment: (NOTE) The Xpert SA Assay (FDA approved for NASAL specimens in patients 47 years of age and older), is one component of a comprehensive surveillance program. It is not intended to diagnose infection nor to guide or monitor treatment. Performed at [facility], [address]  [place], [place] [address]          Radiology Studies: MR 3D Recon At Scanner  Result Date: 05/09/2020 CLINICAL DATA:  Abdominal pain cholelithiasis, dilated CBD EXAM: MRI ABDOMEN WITHOUT AND WITH CONTRAST (INCLUDING MRCP) TECHNIQUE: Multiplanar multisequence MR imaging of the abdomen was performed both before and after the administration of intravenous contrast. Heavily T2-weighted images of the biliary and pancreatic ducts were obtained, and three-dimensional MRCP images were rendered by post processing. CONTRAST:  36mL GADAVIST GADOBUTROL 1 MMOL/ML IV SOLN COMPARISON:  Right upper quadrant ultrasound dated 05/08/2020. CT abdomen dated 02/26/2020 FINDINGS: Motion degraded images. Lower chest: Lung bases are clear.  Hepatobiliary: No morphologic findings of cirrhosis. No hepatic steatosis. Mildly heterogeneous early arterial perfusion (series 22/image 37) which normalizes on later phases. No suspicious/enhancing hepatic lesions. Layering gallstones (series 16/image 23), with very mild gallbladder wall thickening/edema (series 3/image 23). No intrahepatic ductal dilatation. Mildly dilated common duct, measuring 10 mm (series 5/image 14), unchanged from prior CT. No choledocholithiasis is seen. Pancreas:  Within normal limits. Spleen:  Within normal limits. Adrenals/Urinary Tract:  Adrenal glands are within normal limits. Tiny bilateral renal cysts measuring up to 5 mm (series 5/image 18 and 22). No hydronephrosis. Stomach/Bowel: Stomach is within normal limits. Visualized bowel is grossly unremarkable. Vascular/Lymphatic:  No evidence of abdominal aortic aneurysm. No suspicious abdominal lymphadenopathy. Other:  No abdominal ascites. Musculoskeletal: No focal osseous lesions. Mild degenerative changes of the lumbar spine with levoscoliosis. IMPRESSION: Motion degraded images. No intrahepatic ductal dilatation. Mildly dilated common duct, measuring 10 mm, unchanged from prior CT. No choledocholithiasis is seen. In the setting of abnormal LFTs, recent passage a common duct stone is possible. Given the degree of motion degradation, nonvisualized distal CBD stone or stricture is also possible, although these are not favored given the relative stability from prior CT. Cholelithiasis with very mild gallbladder wall thickening/edema. While very early acute cholecystitis is difficult to exclude, this appearance may be secondary to a right upper quadrant inflammatory process such as hepatitis. Consider follow-up right upper quadrant ultrasound or hepatobiliary nuclear medicine scan if symptoms persist. Electronically Signed   By: Julian Hy M.D.   On: 05/09/2020 12:00   MR ABDOMEN MRCP W WO CONTAST  Result Date:  05/09/2020 CLINICAL DATA:  Abdominal pain cholelithiasis, dilated CBD EXAM: MRI ABDOMEN WITHOUT AND WITH CONTRAST (INCLUDING MRCP) TECHNIQUE: Multiplanar multisequence MR imaging of the abdomen was performed both before and after the administration of intravenous contrast. Heavily T2-weighted images of the biliary and pancreatic ducts were obtained, and three-dimensional MRCP images were rendered by post processing. CONTRAST:  61mL GADAVIST GADOBUTROL 1 MMOL/ML IV SOLN COMPARISON:  Right upper quadrant ultrasound dated 05/08/2020. CT abdomen dated 02/26/2020 FINDINGS: Motion degraded images. Lower chest: Lung bases are clear. Hepatobiliary: No morphologic findings of cirrhosis. No hepatic steatosis. Mildly heterogeneous early arterial perfusion (series 22/image 37) which normalizes on later phases. No suspicious/enhancing hepatic lesions. Layering gallstones (series 16/image 23), with very mild gallbladder wall thickening/edema (series 3/image 23). No intrahepatic ductal dilatation. Mildly dilated common duct, measuring 10 mm (series 5/image 14), unchanged from prior CT. No choledocholithiasis is seen. Pancreas:  Within normal limits. Spleen:  Within normal limits. Adrenals/Urinary Tract:  Adrenal glands are within normal limits. Tiny bilateral renal cysts measuring up to 5 mm (series 5/image 18 and 22). No hydronephrosis. Stomach/Bowel: Stomach is within normal limits. Visualized bowel is grossly unremarkable. Vascular/Lymphatic:  No evidence of abdominal aortic aneurysm. No suspicious abdominal lymphadenopathy. Other:  No abdominal ascites. Musculoskeletal: No  focal osseous lesions. Mild degenerative changes of the lumbar spine with levoscoliosis. IMPRESSION: Motion degraded images. No intrahepatic ductal dilatation. Mildly dilated common duct, measuring 10 mm, unchanged from prior CT. No choledocholithiasis is seen. In the setting of abnormal LFTs, recent passage a common duct stone is possible. Given the degree  of motion degradation, nonvisualized distal CBD stone or stricture is also possible, although these are not favored given the relative stability from prior CT. Cholelithiasis with very mild gallbladder wall thickening/edema. While very early acute cholecystitis is difficult to exclude, this appearance may be secondary to a right upper quadrant inflammatory process such as hepatitis. Consider follow-up right upper quadrant ultrasound or hepatobiliary nuclear medicine scan if symptoms persist. Electronically Signed   By: Julian Hy M.D.   On: 05/09/2020 12:00   US Abdomen Limited RUQ (LIVER/GB)  Result Date: 05/08/2020 CLINICAL DATA:  Right upper quadrant pain EXAM: ULTRASOUND ABDOMEN LIMITED RIGHT UPPER QUADRANT COMPARISON:  None. FINDINGS: Gallbladder: Multiple stones are seen in the gallbladder. No wall thickening, pericholecystic fluid, or Murphy's sign. Common bile duct: Diameter: 6 mm Liver: No focal lesion identified. Within normal limits in parenchymal echogenicity. Portal vein is patent on color Doppler imaging with normal direction of blood flow towards the liver. Other: None. IMPRESSION: 1. Cholelithiasis without wall thickening, pericholecystic fluid, or Murphy's sign. If the clinical picture remains ambiguous, recommend HIDA scan. 2. The common bile duct is borderline measuring 6.3 mm. Recommend correlation with clinical picture and labs. If there is concern for biliary obstruction, recommend MRCP or ERCP. Electronically Signed   By: Dorise Bullion III M.D   On: 05/08/2020 16:30        Scheduled Meds: . enoxaparin (LOVENOX) injection  40 mg Subcutaneous Q24H  . isosorbide mononitrate  30 mg Oral Daily  . levothyroxine  125 mcg Oral QAC breakfast  . mirtazapine  15 mg Oral QHS  . pantoprazole  40 mg Oral Daily   Continuous Infusions: . cefTRIAXone (ROCEPHIN)  IV 2 g (05/09/20 2011)  . dextrose 5 % and 0.9% NaCl 75 mL/hr at 05/10/20 0249     LOS: 2 days    Time spent:25  mins. More than 50% of that time was spent in counseling and/or coordination of care.      Shelly Coss, MD [facility] P1/02/2021, 8:18 AM

## 2020-05-10 NOTE — Op Note (Signed)
LAPAROSCOPIC CHOLECYSTECTOMY WITH INTRAOPERATIVE CHOLANGIOGRAM  Procedure Note  Gary Gonzalez 05/08/2020 - 05/10/2020   Pre-op Diagnosis: gallstone pancreatitis     Post-op Diagnosis: same  Procedure(s): LAPAROSCOPIC CHOLECYSTECTOMY WITH INTRAOPERATIVE CHOLANGIOGRAM  Surgeon(s): Coralie Keens, MD  Anesthesia: General  Staff:  Circulator: Anderson Malta, RN Physician Assistant: Jillyn Ledger, PA-C Radiology Technologist: Silvio Pate, RT Scrub Person: Tamala Julian, RN; Jilda Roche M, CST  Estimated Blood Loss: Minimal               Specimens: sent to path  Findings: The patient was found to have an acutely inflamed gallbladder with gallstones. A cholangiogram was performed. The bile duct and pancreatic duct were dilated. Contrast flowed easily into the duodenum. It was difficult to tell whether there could be a distal stone versus air bubble. Final read on the cholangiogram from radiology is pending  Procedure: The patient brought to the operating room and identifies correct patient. He was placed upon the operating table and general anesthesia was induced. His abdomen was a prepped and draped in the usual sterile fashion. I made a small vertical incision below the umbilicus with a scalpel. I then dissected down to the fascia which was then opened the scalpel. Hemostat was then used to pass to the peritoneal cavity under direct vision. A 0 Vicryl pursing suture was placed around the fascial opening. The [facility] port was placed to the opening and insufflation of the abdomen was obtained. A 5 mm trocar was then placed the patient epigastrium and 2 more in the right upper quadrant under direct vision. The gallbladder was found to be mildly distended and acutely inflamed. It was grasped and retracted above the liver. I then dissected out the base the gallbladder. The cystic artery and cystic duct as well as a small branch of the vein were identified. I clipped the artery  proximally distally and transected it. I then cauterized small vein. I then clipped the cystic duct once distally and opened with laparoscopic scissors. A cholangiocatheter then placed through a small opening in the right upper quadrant under direct vision. I placed the cholangiocatheter into the opening the cystic duct under direct vision. I then performed a cholangiogram under contrast. The contrast was seen to flow through a dilated bile duct and dilated pancreatic duct and into the duodenum. There was a question of a small defect for stone or air bubble in the distal common bile duct. The cholangiocatheter was then removed. I clipped the proximal cystic duct 3 times with surgical clips and transected. The gallbladder was then slowly dissected free from the liver bed with electrocautery. Once was free from liver bed I achieved hemostasis with the cautery as well as 1 piece of surgical snow. The gallbladder is placed in an Endosac as well as several stones and then removed through the umbilicus. I then copes irrigated the right upper quadrant. Hemostasis peer to be achieved. All ports removed under direct vision the abdomen is deflated. The 0 Vicryl the umbilicus was tied in place closing the fascial defect. All incisions were then anesthetized Marcaine and closed with 4-0 Monocryl sutures. Dermabond was then applied. The patient tolerated the procedure well. All the counts were correct at the end of the procedure. The patient was then extubated in the operating room and taken in stable condition to the recovery room.          Coralie Keens   Date: 05/10/2020  Time: 11:05 AM

## 2020-05-10 NOTE — Progress Notes (Signed)
IOC and hospital computer chart reviewed I have tentatively scheduled him for an ERCP tomorrow and will check on him later today and will discuss that procedure with him and if not able to today then first thing in the morning and continue n.p.o. after midnight for probable above procedure tomorrow

## 2020-05-10 NOTE — Anesthesia Procedure Notes (Signed)
Procedure Name: Intubation Date/Time: 05/10/2020 10:40 AM Performed by: Lavina Hamman, CRNA Pre-anesthesia Checklist: Patient identified, Emergency Drugs available, Suction available, Patient being monitored and Timeout performed Patient Re-evaluated:Patient Re-evaluated prior to induction Oxygen Delivery Method: Circle system utilized Preoxygenation: Pre-oxygenation with 100% oxygen Induction Type: IV induction Ventilation: Mask ventilation without difficulty Laryngoscope Size: Mac and 4 Grade View: Grade I Tube type: Oral Tube size: 7.5 mm Number of attempts: 1 Airway Equipment and Method: Stylet Placement Confirmation: ETT inserted through vocal cords under direct vision,  positive ETCO2,  CO2 detector and breath sounds checked- equal and bilateral Secured at: 23 cm Tube secured with: Tape Dental Injury: Teeth and Oropharynx as per pre-operative assessment  Comments: ATOI

## 2020-05-10 NOTE — Transfer of Care (Signed)
Immediate Anesthesia Transfer of Care Note  Patient: Gary Gonzalez  Procedure(s) Performed: Procedure(s): LAPAROSCOPIC CHOLECYSTECTOMY WITH INTRAOPERATIVE CHOLANGIOGRAM (N/A)  Patient Location: PACU  Anesthesia Type:General  Level of Consciousness:  sedated, patient cooperative and responds to stimulation  Airway & Oxygen Therapy:Patient Spontanous Breathing and Patient connected to face mask oxgen  Post-op Assessment:  Report given to PACU RN and Post -op Vital signs reviewed and stable  Post vital signs:  Reviewed and stable  Last Vitals:  Vitals:   05/10/20 0535 05/10/20 0900  BP: (!) 120/48 (!) 141/53  Pulse: 71 67  Resp: 17 18  Temp: 36.4 C 36.8 C  SpO2: 017% 49%    Complications: No apparent anesthesia complications

## 2020-05-10 NOTE — Progress Notes (Signed)
Patient ID: Gary Gonzalez, male   DOB: [birth year], 87 y.o.   MRN: [id number]   Preop:  MRCP shows dilated CBD but no stone LFT"s and lipase trending down. His pain is less and on exam his tenderness is less.  Plan lap chole with IOC today  I discussed the procedure in detail. We discussed the risks and benefits of a laparoscopic cholecystectomy and  cholangiogram including, but not limited to bleeding, infection, injury to surrounding structures such as the intestine or liver, bile leak, retained gallstones, need to convert to an open procedure, prolonged diarrhea, blood clots such as  DVT, common bile duct injury, anesthesia risks, and possible need for additional procedures.  The likelihood of improvement in symptoms and return to the patient's normal status is good. We discussed the typical post-operative recovery course.  He agrees to proceed.

## 2020-05-11 ENCOUNTER — Inpatient Hospital Stay (HOSPITAL_COMMUNITY): Payer: PPO

## 2020-05-11 ENCOUNTER — Inpatient Hospital Stay (HOSPITAL_COMMUNITY): Payer: PPO | Admitting: Anesthesiology

## 2020-05-11 ENCOUNTER — Encounter (HOSPITAL_COMMUNITY): Admission: EM | Disposition: A | Discharge status: Home / Self Care | Payer: Self-pay | Source: Home / Self Care

## 2020-05-11 ENCOUNTER — Encounter (HOSPITAL_COMMUNITY): Payer: Self-pay | Admitting: Surgery

## 2020-05-11 HISTORY — PX: ERCP: SHX5425

## 2020-05-11 HISTORY — PX: SPHINCTEROTOMY: SHX5279

## 2020-05-11 HISTORY — PX: REMOVAL OF STONES: SHX5545

## 2020-05-11 LAB — COMPREHENSIVE METABOLIC PANEL
ALT: 210 U/L — ABNORMAL HIGH (ref 0–44)
ALT: 148 U/L — ABNORMAL HIGH (ref 0–44)
AST: 116 U/L — ABNORMAL HIGH (ref 15–41)
AST: 75 U/L — ABNORMAL HIGH (ref 15–41)
Albumin: 2.8 g/dL — ABNORMAL LOW (ref 3.5–5.0)
Albumin: 2.5 g/dL — ABNORMAL LOW (ref 3.5–5.0)
Alkaline Phosphatase: 265 U/L — ABNORMAL HIGH (ref 38–126)
Alkaline Phosphatase: 205 U/L — ABNORMAL HIGH (ref 38–126)
Anion gap: 9 (ref 5–15)
Anion gap: 9 (ref 5–15)
BUN: 14 mg/dL (ref 8–23)
BUN: 19 mg/dL (ref 8–23)
CO2: 24 mmol/L (ref 22–32)
CO2: 21 mmol/L — ABNORMAL LOW (ref 22–32)
Calcium: 8 mg/dL — ABNORMAL LOW (ref 8.9–10.3)
Calcium: 7.6 mg/dL — ABNORMAL LOW (ref 8.9–10.3)
Chloride: 107 mmol/L (ref 98–111)
Chloride: 106 mmol/L (ref 98–111)
Creatinine, Ser: 1.13 mg/dL (ref 0.61–1.24)
Creatinine, Ser: 1.25 mg/dL — ABNORMAL HIGH (ref 0.61–1.24)
GFR, Estimated: >60 mL/min (ref >60)
GFR, Estimated: 54 mL/min — ABNORMAL LOW (ref >60)
Glucose, Bld: 105 mg/dL — ABNORMAL HIGH (ref 70–99)
Glucose, Bld: 199 mg/dL — ABNORMAL HIGH (ref 70–99)
Potassium: 3.4 mmol/L — ABNORMAL LOW (ref 3.5–5.1)
Potassium: 3.6 mmol/L (ref 3.5–5.1)
Sodium: 140 mmol/L (ref 135–145)
Sodium: 136 mmol/L (ref 135–145)
Total Bilirubin: 0.8 mg/dL (ref 0.3–1.2)
Total Bilirubin: 0.7 mg/dL (ref 0.3–1.2)
Total Protein: 6.1 g/dL — ABNORMAL LOW (ref 6.5–8.1)
Total Protein: 5.5 g/dL — ABNORMAL LOW (ref 6.5–8.1)

## 2020-05-11 LAB — CBC
HCT: 30.3 % — ABNORMAL LOW (ref 39.0–52.0)
Hemoglobin: 9.6 g/dL — ABNORMAL LOW (ref 13.0–17.0)
MCH: 30 pg (ref 26.0–34.0)
MCHC: 31.7 g/dL (ref 30.0–36.0)
MCV: 94.7 fL (ref 80.0–100.0)
Platelets: 256 10*3/uL (ref 150–400)
RBC: 3.2 MIL/uL — ABNORMAL LOW (ref 4.22–5.81)
RDW: 13.8 % (ref 11.5–15.5)
WBC: 11.5 10*3/uL — ABNORMAL HIGH (ref 4.0–10.5)
nRBC: 0 % (ref 0.0–0.2)

## 2020-05-11 LAB — SURGICAL PATHOLOGY

## 2020-05-11 SURGERY — ERCP, WITH INTERVENTION IF INDICATED
Anesthesia: General

## 2020-05-11 MED ORDER — PROPOFOL 10 MG/ML IV BOLUS
INTRAVENOUS | Status: DC | PRN
  Administered 2020-05-11: 40 mg via INTRAVENOUS
  Administered 2020-05-11: 60 mg via INTRAVENOUS

## 2020-05-11 MED ORDER — SUGAMMADEX SODIUM 200 MG/2ML IV SOLN
INTRAVENOUS | Status: DC | PRN
  Administered 2020-05-11: 200 mg via INTRAVENOUS

## 2020-05-11 MED ORDER — SODIUM CHLORIDE 0.9 % IV SOLN: INTRAVENOUS | Status: DC

## 2020-05-11 MED ORDER — GLUCAGON HCL RDNA (DIAGNOSTIC) 1 MG IJ SOLR
INTRAMUSCULAR | Status: AC
  Filled 2020-05-11: qty 1

## 2020-05-11 MED ORDER — PHENYLEPHRINE HCL-NACL 10-0.9 MG/250ML-% IV SOLN
INTRAVENOUS | Status: DC | PRN
  Administered 2020-05-11: 50 ug/min via INTRAVENOUS

## 2020-05-11 MED ORDER — INDOMETHACIN 50 MG RE SUPP: 100.0000 mg | Freq: Once | RECTAL | Status: DC

## 2020-05-11 MED ORDER — INDOMETHACIN 50 MG RE SUPP
RECTAL | Status: AC
  Filled 2020-05-11: qty 2

## 2020-05-11 MED ORDER — LIDOCAINE 2% (20 MG/ML) 5 ML SYRINGE
INTRAMUSCULAR | Status: DC | PRN
  Administered 2020-05-11: 60 mg via INTRAVENOUS

## 2020-05-11 MED ORDER — ROCURONIUM BROMIDE 10 MG/ML (PF) SYRINGE
PREFILLED_SYRINGE | INTRAVENOUS | Status: DC | PRN
  Administered 2020-05-11: 40 mg via INTRAVENOUS

## 2020-05-11 MED ORDER — PROPOFOL 10 MG/ML IV BOLUS
INTRAVENOUS | Status: AC
  Filled 2020-05-11: qty 20

## 2020-05-11 MED ORDER — PHENYLEPHRINE 40 MCG/ML (10ML) SYRINGE FOR IV PUSH (FOR BLOOD PRESSURE SUPPORT)
PREFILLED_SYRINGE | INTRAVENOUS | Status: DC | PRN
  Administered 2020-05-11: 120 ug via INTRAVENOUS
  Administered 2020-05-11: 80 ug via INTRAVENOUS

## 2020-05-11 MED ORDER — INDOMETHACIN 50 MG RE SUPP
RECTAL | Status: DC | PRN
  Administered 2020-05-11: 100 mg via RECTAL

## 2020-05-11 MED ORDER — SODIUM CHLORIDE 0.9 % IV SOLN
INTRAVENOUS | Status: DC | PRN
  Administered 2020-05-11: 30 mL

## 2020-05-11 MED ORDER — SODIUM CHLORIDE 0.9 % IV SOLN: INTRAVENOUS | Status: DC | PRN

## 2020-05-11 MED ORDER — ONDANSETRON HCL 4 MG/2ML IJ SOLN
INTRAMUSCULAR | Status: DC | PRN
  Administered 2020-05-11: 4 mg via INTRAVENOUS

## 2020-05-11 MED ORDER — CIPROFLOXACIN IN D5W 400 MG/200ML IV SOLN
INTRAVENOUS | Status: AC
  Filled 2020-05-11: qty 200

## 2020-05-11 MED ORDER — DEXAMETHASONE SODIUM PHOSPHATE 4 MG/ML IJ SOLN
INTRAMUSCULAR | Status: DC | PRN
  Administered 2020-05-11: 6 mg via INTRAVENOUS

## 2020-05-11 MED ORDER — CIPROFLOXACIN IN D5W 400 MG/200ML IV SOLN
INTRAVENOUS | Status: DC | PRN
  Administered 2020-05-11: 400 mg via INTRAVENOUS

## 2020-05-11 NOTE — Op Note (Signed)
[facility] Patient Name: Gary Gonzalez Procedure Date: 05/11/2020 MRN: [id number] Attending MD: Clarene Essex , MD Date of Birth: 07/09/1926 CSN: [id number] Age: 87 Admit Type: Inpatient Procedure:                ERCP Indications:              For therapy of bile duct stone(s) Providers:                Clarene Essex, MD, Cleda Daub, RN, Cherylynn Ridges,                            Technician, Gilford Rile CRNA, CRNA Referring MD:              Medicines:                General Anesthesia Complications:            No immediate complications. Estimated Blood Loss:     Estimated blood loss: none. Procedure:                Pre-Anesthesia Assessment:                           - Prior to the procedure, a History and Physical                            was performed, and patient medications and                            allergies were reviewed. The patient's tolerance of                            previous anesthesia was also reviewed. The risks                            and benefits of the procedure and the sedation                            options and risks were discussed with the patient.                            All questions were answered, and informed consent                            was obtained. Prior Anticoagulants: The patient has                            taken Lovenox (enoxaparin), last dose was 1 day                            prior to procedure. ASA Grade Assessment: II - A                            patient with mild systemic disease. After reviewing  the risks and benefits, the patient was deemed in                            satisfactory condition to undergo the procedure.                           After obtaining informed consent, the scope was                            passed under direct vision. Throughout the                            procedure, the patient's blood pressure, pulse, and                            oxygen  saturations were monitored continuously. The                            [id number]-Q180V ([id number]) Olympus Duodenoscope was                            introduced through the mouth, and used to inject                            contrast into and used to cannulate the bile duct.                            The ERCP was accomplished without difficulty. The                            patient tolerated the procedure well. Scope In: Scope Out: Findings:      The major papilla was normal.      Deep selective cannulation was obtained on the first attempt and the       wire was advanced into the intrahepatic and a cholangiogram was obtained       which confirmed at least 1 small stone and a biliary sphincterotomy was       made with a Hydratome sphincterotome using ERBE electrocautery. The       sphincterotomy was extended until we had adequate biliary drainage and       could get the fully bowed sphincterotome easily in and out of the duct       there was no post-sphincterotomy bleeding. Choledocholithiasis was found       in a minimally dilated duct. The biliary tree was swept with a       adjustable 12 -15 mm balloon starting at the bifurcation, left main       hepatic duct and right main hepatic duct. Sludge was swept from the       duct. All stones were removed. Nothing was found at the end of the       procedure on occlusion cholangiogram and both balloons passed readily       through the patent sphincterotomy site and there was adequate biliary       drainage and not mentioned above there was no pancreatic duct injection  or wire advancement throughout the procedure. Impression:               - The major papilla appeared normal.                           - Choledocholithiasis was found. Complete removal                            was accomplished by biliary sphincterotomy and                            balloon extraction.                           - A biliary sphincterotomy was performed.                            - The biliary tree was swept and nothing was found                            at the end of the procedure. Moderate Sedation:      Not Applicable - Patient had care per Anesthesia. Recommendation:           - Clear liquid diet for 6 hours.                           - Check liver enzymes (AST, ALT, alkaline                            phosphatase, bilirubin) and hemogram with white                            blood cell count and platelets in the morning.                           - Continue present medications.                           - Return to GI clinic PRN. Hopefully patient will                            be able to be discharged soon when okay from                            surgical standpoint as long as no delayed                            complication                           - Telephone GI clinic if symptomatic PRN. Procedure Code(s):        --- Professional ---                           [phone], Endoscopic retrograde  cholangiopancreatography (ERCP); with removal of                            calculi/debris from biliary/pancreatic duct(s)                           43262, Endoscopic retrograde                            cholangiopancreatography (ERCP); with                            sphincterotomy/papillotomy Diagnosis Code(s):        --- Professional ---                           K80.50, Calculus of bile duct without cholangitis                            or cholecystitis without obstruction CPT copyright 2019 American Medical Association. All rights reserved. The codes documented in this report are preliminary and upon coder review may  be revised to meet current compliance requirements. Clarene Essex, MD 05/11/2020 1:30:24 PM This report has been signed electronically. Number of Addenda: 0

## 2020-05-11 NOTE — Progress Notes (Signed)
Marshall Cork Simons 12:12 PM  Subjective: Patient doing well without any new complaints and only with incisional pain  Objective: Vital signs stable afebrile exam please see preassessment evaluation White count 11 which is the same hemoglobin stable liver test trending down  Assessment: Positive Intra-Op cholangiogram in a patient with resolved gallstone pancreatitis  Plan: Okay to proceed with ERCP with anesthesia assistance  [facility]  office [phone] After 5PM or if no answer call [phone]

## 2020-05-11 NOTE — Progress Notes (Signed)
PROGRESS NOTE    Gary Gonzalez  ZOX:[id number] DOB: 03-28-1927 DOA: 05/08/2020 PCP: [name], MD   Chief Complain: Abdominal pain  Brief Narrative: Patient is a 87 year old male with history of coronary disease, PVC induced cardiomyopathy, hypothyroidism who initially presented with abdomen pain, nausea.  On presentation he was found to have elevated liver enzymes, severely elevated lipase.  Right upper quadrant ultrasound showed cholelithiasis.  He was admitted under general surgery service for the management of gallstone pancreatitis/cholecystitis. Underwent for lap cholecystectomy with IOC.  TRH consulted for medical management.  Assessment & Plan:   Principal Problem:   Acute cholecystitis Active Problems:   CAD (coronary artery disease)   Hypothyroidism (acquired)   Acute cholecystitis/gallstone pancreatitis:   Elevated LFTs, lipase.  MRCP showed mildly dilated CBD with size of 10 mm, no choledocholithiasis, cholelithiasis with mild gallbladder wall thickening.  Underwent  lap cholecystectomy with IOC. liver enzymes, lipase improving.  Mild leukocytosis present.GI also on board with plan for ERCP today  Management as per general surgery  Coronary artery disease: Status post CABG.  Denies any chest pain.  On Imdur, aspirin at home.  Follows with cardiology.  No further work-up planned at this admission.  History of PVC induced cardiomyopathy:Currently stable.  Follows with cardiology.  Continue current medications  Hypothyroidism: Continue Synthyroid  TRH will continue to follow         DVT prophylaxis:Lovenox Code Status: Full    Antimicrobials:  Anti-infectives (From admission, onward)   Start     Dose/Rate Route Frequency Ordered Stop   05/08/20 2030  cefTRIAXone (ROCEPHIN) 2 g in sodium chloride 0.9 % 100 mL IVPB  Status:  Discontinued        2 g 200 mL/hr over 30 Minutes Intravenous Every 24 hours 05/08/20 2017 05/10/20 1241      Subjective: Patient seen  and examined at bedside this morning.  Hemodynamically stable.  Comfortable.  Denies any abdomen pain, nausea or vomiting.  Objective: Vitals:   05/10/20 1608 05/10/20 2238 05/11/20 0208 05/11/20 0601  BP: 131/71 (!) 151/62 (!) 137/55 (!) 135/58  Pulse: 90 84 85 90  Resp: 18 16 16 16   Temp: 97.7 F (36.5 C) 98.9 F (37.2 C) 98 F (36.7 C) 98.3 F (36.8 C)  TempSrc: Oral Oral Oral Oral  SpO2: 100% 97% 96% 91%  Weight:      Height:        Intake/Output Summary (Last 24 hours) at 05/11/2020 0746 Last data filed at 05/11/2020 0600 Gross per 24 hour  Intake 2555.36 ml  Output 630 ml  Net 1925.36 ml   Filed Weights   05/08/20 1532  Weight: 66 kg    Examination:  General exam: Appears calm and comfortable ,Not in distress,average built HEENT:PERRL,Oral mucosa moist, Ear/Nose normal on gross exam Respiratory system: Bilateral equal air entry, normal vesicular breath sounds, no wheezes or crackles  Cardiovascular system: S1 & S2 heard, RRR. No JVD, murmurs, rubs, gallops or clicks. Gastrointestinal system: Abdomen is nondistended, soft and nontender. No organomegaly or masses felt. Normal bowel sounds heard.Clean surgical wounds Central nervous system: Alert and oriented. No focal neurological deficits. Extremities: No edema, no clubbing ,no cyanosis Skin: No rashes, lesions or ulcers,no icterus ,no pallor     Data Reviewed: I have personally reviewed following labs and imaging studies  CBC: Recent Labs  Lab 05/08/20 1539 05/09/20 0809 05/10/20 0220 05/11/20 0346  WBC 10.5 12.7* 11.6* 11.5*  NEUTROABS 8.8*  --   --   --  HGB 12.3* 10.8* 9.8* 9.6*  HCT 37.4* 32.9* 30.4* 30.3*  MCV 91.4 92.9 93.5 94.7  PLT 276 266 260 [id number]   Basic Metabolic Panel: Recent Labs  Lab 05/08/20 1539 05/09/20 0809 05/10/20 0220  NA 134* 138 138  K 4.2 4.4 3.7  CL 101 105 105  CO2 22 25 24   GLUCOSE 133* 111* 97  BUN 19 16 19   CREATININE 1.09 1.18 1.18  CALCIUM 8.9 8.4* 8.3*    GFR: Estimated Creatinine Clearance: 38.1 mL/min (by C-G formula based on SCr of 1.18 mg/dL). Liver Function Tests: Recent Labs  Lab 05/08/20 1539 05/09/20 0809 05/10/20 0220  AST 521* 322* 189*  ALT 716* 538* 434*  ALKPHOS 409* 401* 370*  BILITOT 3.3* 1.7* 1.2  PROT 6.8 6.3* 6.4*  ALBUMIN 3.4* 3.1* 3.0*   Recent Labs  Lab 05/08/20 1539 05/09/20 0809 05/10/20 0220  LIPASE >10,000* 946* 217*   No results for input(s): AMMONIA in the last 168 hours. Coagulation Profile: Recent Labs  Lab 05/08/20 1539  INR 1.0   Cardiac Enzymes: No results for input(s): CKTOTAL, CKMB, CKMBINDEX, TROPONINI in the last 168 hours. BNP (last 3 results) No results for input(s): PROBNP in the last 8760 hours. HbA1C: No results for input(s): HGBA1C in the last 72 hours. CBG: Recent Labs  Lab 05/09/20 2135  GLUCAP 117*   Lipid Profile: No results for input(s): CHOL, HDL, LDLCALC, TRIG, CHOLHDL, LDLDIRECT in the last 72 hours. Thyroid Function Tests: No results for input(s): TSH, T4TOTAL, FREET4, T3FREE, THYROIDAB in the last 72 hours. Anemia Panel: No results for input(s): VITAMINB12, FOLATE, FERRITIN, TIBC, IRON, RETICCTPCT in the last 72 hours. Sepsis Labs: No results for input(s): PROCALCITON, LATICACIDVEN in the last 168 hours.  Recent Results (from the past 240 hour(s))  SARS CORONAVIRUS 2 (TAT 6-24 HRS) Nasopharyngeal Nasopharyngeal Swab     Status: None   Collection Time: 05/08/20  5:27 PM   Specimen: Nasopharyngeal Swab  Result Value Ref Range Status   SARS Coronavirus 2 NEGATIVE NEGATIVE Final    Comment: (NOTE) SARS-CoV-2 target nucleic acids are NOT DETECTED.  The SARS-CoV-2 RNA is generally detectable in upper and lower respiratory specimens during the acute phase of infection. Negative results do not preclude SARS-CoV-2 infection, do not rule out co-infections with other pathogens, and should not be used as the sole basis for treatment or other patient management  decisions. Negative results must be combined with clinical observations, patient history, and epidemiological information. The expected result is Negative.  Fact Sheet for Patients: SugarRoll.be  Fact Sheet for Healthcare Providers: https://www.woods-mathews.com/  This test is not yet approved or cleared by the [place] FDA and  has been authorized for detection and/or diagnosis of SARS-CoV-2 by FDA under an Emergency Use Authorization (EUA). This EUA will remain  in effect (meaning this test can be used) for the duration of the COVID-19 declaration under Se ction 564(b)(1) of the Act, 21 U.S.C. section 360bbb-3(b)(1), unless the authorization is terminated or revoked sooner.  Performed at [facility], [place] [address], [place], [place] [address]   Surgical pcr screen     Status: None   Collection Time: 05/09/20  4:53 AM   Specimen: Nasal Mucosa; Nasal Swab  Result Value Ref Range Status   MRSA, PCR NEGATIVE NEGATIVE Final   Staphylococcus aureus NEGATIVE NEGATIVE Final    Comment: (NOTE) The Xpert SA Assay (FDA approved for NASAL specimens in patients 56 years of age and older), is one component of  a comprehensive surveillance program. It is not intended to diagnose infection nor to guide or monitor treatment. Performed at [facility], [place] [address], [place], [place] [address]          Radiology Studies: DG Cholangiogram Operative  Result Date: 05/10/2020 CLINICAL DATA:  Cholelithiasis. EXAM: INTRAOPERATIVE CHOLANGIOGRAM TECHNIQUE: Cholangiographic images from the C-arm fluoroscopic device were submitted for interpretation post-operatively. Please see the procedural report for the amount of contrast and the fluoroscopy time utilized. COMPARISON:  None. FINDINGS: Submitted images demonstrate opacification of the biliary tree through cystic duct remnant. There is partial opacification of the pancreatic  duct the she sees he is to without significant abnormality. Multiple small filling defects seen within the distal common bile duct most likely related to calculi. There is flow of contrast into the duodenum. IMPRESSION: Intraoperative cholangiogram as above. Electronically Signed   By: Miachel Roux M.D.   On: 05/10/2020 11:07   MR 3D Recon At Scanner  Result Date: 05/09/2020 CLINICAL DATA:  Abdominal pain cholelithiasis, dilated CBD EXAM: MRI ABDOMEN WITHOUT AND WITH CONTRAST (INCLUDING MRCP) TECHNIQUE: Multiplanar multisequence MR imaging of the abdomen was performed both before and after the administration of intravenous contrast. Heavily T2-weighted images of the biliary and pancreatic ducts were obtained, and three-dimensional MRCP images were rendered by post processing. CONTRAST:  48mL GADAVIST GADOBUTROL 1 MMOL/ML IV SOLN COMPARISON:  Right upper quadrant ultrasound dated 05/08/2020. CT abdomen dated 02/26/2020 FINDINGS: Motion degraded images. Lower chest: Lung bases are clear. Hepatobiliary: No morphologic findings of cirrhosis. No hepatic steatosis. Mildly heterogeneous early arterial perfusion (series 22/image 37) which normalizes on later phases. No suspicious/enhancing hepatic lesions. Layering gallstones (series 16/image 23), with very mild gallbladder wall thickening/edema (series 3/image 23). No intrahepatic ductal dilatation. Mildly dilated common duct, measuring 10 mm (series 5/image 14), unchanged from prior CT. No choledocholithiasis is seen. Pancreas:  Within normal limits. Spleen:  Within normal limits. Adrenals/Urinary Tract:  Adrenal glands are within normal limits. Tiny bilateral renal cysts measuring up to 5 mm (series 5/image 18 and 22). No hydronephrosis. Stomach/Bowel: Stomach is within normal limits. Visualized bowel is grossly unremarkable. Vascular/Lymphatic:  No evidence of abdominal aortic aneurysm. No suspicious abdominal lymphadenopathy. Other:  No abdominal ascites.  Musculoskeletal: No focal osseous lesions. Mild degenerative changes of the lumbar spine with levoscoliosis. IMPRESSION: Motion degraded images. No intrahepatic ductal dilatation. Mildly dilated common duct, measuring 10 mm, unchanged from prior CT. No choledocholithiasis is seen. In the setting of abnormal LFTs, recent passage a common duct stone is possible. Given the degree of motion degradation, nonvisualized distal CBD stone or stricture is also possible, although these are not favored given the relative stability from prior CT. Cholelithiasis with very mild gallbladder wall thickening/edema. While very early acute cholecystitis is difficult to exclude, this appearance may be secondary to a right upper quadrant inflammatory process such as hepatitis. Consider follow-up right upper quadrant ultrasound or hepatobiliary nuclear medicine scan if symptoms persist. Electronically Signed   By: Julian Hy M.D.   On: 05/09/2020 12:00   MR ABDOMEN MRCP W WO CONTAST  Result Date: 05/09/2020 CLINICAL DATA:  Abdominal pain cholelithiasis, dilated CBD EXAM: MRI ABDOMEN WITHOUT AND WITH CONTRAST (INCLUDING MRCP) TECHNIQUE: Multiplanar multisequence MR imaging of the abdomen was performed both before and after the administration of intravenous contrast. Heavily T2-weighted images of the biliary and pancreatic ducts were obtained, and three-dimensional MRCP images were rendered by post processing. CONTRAST:  57mL GADAVIST GADOBUTROL 1 MMOL/ML IV SOLN COMPARISON:  Right upper quadrant ultrasound dated 05/08/2020. CT abdomen dated 02/26/2020 FINDINGS: Motion degraded images. Lower chest: Lung bases are clear. Hepatobiliary: No morphologic findings of cirrhosis. No hepatic steatosis. Mildly heterogeneous early arterial perfusion (series 22/image 37) which normalizes on later phases. No suspicious/enhancing hepatic lesions. Layering gallstones (series 16/image 23), with very mild gallbladder wall thickening/edema (series  3/image 23). No intrahepatic ductal dilatation. Mildly dilated common duct, measuring 10 mm (series 5/image 14), unchanged from prior CT. No choledocholithiasis is seen. Pancreas:  Within normal limits. Spleen:  Within normal limits. Adrenals/Urinary Tract:  Adrenal glands are within normal limits. Tiny bilateral renal cysts measuring up to 5 mm (series 5/image 18 and 22). No hydronephrosis. Stomach/Bowel: Stomach is within normal limits. Visualized bowel is grossly unremarkable. Vascular/Lymphatic:  No evidence of abdominal aortic aneurysm. No suspicious abdominal lymphadenopathy. Other:  No abdominal ascites. Musculoskeletal: No focal osseous lesions. Mild degenerative changes of the lumbar spine with levoscoliosis. IMPRESSION: Motion degraded images. No intrahepatic ductal dilatation. Mildly dilated common duct, measuring 10 mm, unchanged from prior CT. No choledocholithiasis is seen. In the setting of abnormal LFTs, recent passage a common duct stone is possible. Given the degree of motion degradation, nonvisualized distal CBD stone or stricture is also possible, although these are not favored given the relative stability from prior CT. Cholelithiasis with very mild gallbladder wall thickening/edema. While very early acute cholecystitis is difficult to exclude, this appearance may be secondary to a right upper quadrant inflammatory process such as hepatitis. Consider follow-up right upper quadrant ultrasound or hepatobiliary nuclear medicine scan if symptoms persist. Electronically Signed   By: Julian Hy M.D.   On: 05/09/2020 12:00        Scheduled Meds: . enoxaparin (LOVENOX) injection  40 mg Subcutaneous Q24H  . feeding supplement  1 Container Oral TID BM  . isosorbide mononitrate  30 mg Oral Daily  . levothyroxine  125 mcg Oral QAC breakfast  . mirtazapine  15 mg Oral QHS  . pantoprazole  40 mg Oral Daily   Continuous Infusions: . dextrose 5 % and 0.9% NaCl 75 mL/hr at 05/10/20 1956      LOS: 3 days    Time spent:25 mins. More than 50% of that time was spent in counseling and/or coordination of care.      Shelly Coss, MD [facility] P1/03/2021, 7:46 AM

## 2020-05-11 NOTE — Transfer of Care (Signed)
Immediate Anesthesia Transfer of Care Note  Patient: Gary Gonzalez  Procedure(s) Performed: ENDOSCOPIC RETROGRADE CHOLANGIOPANCREATOGRAPHY (ERCP) (N/A )  Patient Location: Endoscopy Unit  Anesthesia Type:General  Level of Consciousness: awake  Airway & Oxygen Therapy: Patient Spontanous Breathing and Patient connected to face mask oxygen  Post-op Assessment: Report given to RN and Post -op Vital signs reviewed and stable  Post vital signs: Reviewed and stable  Last Vitals:  Vitals Value Taken Time  BP    Temp    Pulse 73 05/11/20 1315  Resp 25 05/11/20 1315  SpO2 100 % 05/11/20 1315  Vitals shown include unvalidated device data.  Last Pain:  Vitals:   05/11/20 1124  TempSrc: Oral  PainSc: 0-No pain      Patients Stated Pain Goal: 2 (34/19/37 9024)  Complications: No complications documented.

## 2020-05-11 NOTE — Progress Notes (Signed)
1 Day Post-Op   Subjective/Chief Complaint: No complaints Minimal post op pain   Objective: Vital signs in last 24 hours: Temp:  [97.6 F (36.4 C)-98.9 F (37.2 C)] 98.3 F (36.8 C) (01/12 0601) Pulse Rate:  [62-90] 90 (01/12 0601) Resp:  [9-25] 16 (01/12 0601) BP: (131-177)/(55-83) 135/58 (01/12 0601) SpO2:  [91 %-100 %] 91 % (01/12 0601) Last BM Date: 05/07/20  Intake/Output from previous day: 01/11 0701 - 01/12 0700 In: 2555.4 [P.O.:390; I.V.:2165.4] Out: 630 [Urine:580; Blood:50] Intake/Output this shift: No intake/output data recorded.  Exam: Awake and alert Appropriately tender abdomen, incisions clean  Lab Results:  Recent Labs    05/10/20 0220 05/11/20 0346  WBC 11.6* 11.5*  HGB 9.8* 9.6*  HCT 30.4* 30.3*  PLT 260 256   BMET Recent Labs    05/09/20 0809 05/10/20 0220  NA 138 138  K 4.4 3.7  CL 105 105  CO2 25 24  GLUCOSE 111* 97  BUN 16 19  CREATININE 1.18 1.18  CALCIUM 8.4* 8.3*   PT/INR Recent Labs    05/08/20 1539  LABPROT 13.1  INR 1.0   ABG No results for input(s): PHART, HCO3 in the last 72 hours.  Invalid input(s): PCO2, PO2  Studies/Results: DG Cholangiogram Operative  Result Date: 05/10/2020 CLINICAL DATA:  Cholelithiasis. EXAM: INTRAOPERATIVE CHOLANGIOGRAM TECHNIQUE: Cholangiographic images from the C-arm fluoroscopic device were submitted for interpretation post-operatively. Please see the procedural report for the amount of contrast and the fluoroscopy time utilized. COMPARISON:  None. FINDINGS: Submitted images demonstrate opacification of the biliary tree through cystic duct remnant. There is partial opacification of the pancreatic duct the she sees he is to without significant abnormality. Multiple small filling defects seen within the distal common bile duct most likely related to calculi. There is flow of contrast into the duodenum. IMPRESSION: Intraoperative cholangiogram as above. Electronically Signed   By: Miachel Roux  M.D.   On: 05/10/2020 11:07   MR 3D Recon At Scanner  Result Date: 05/09/2020 CLINICAL DATA:  Abdominal pain cholelithiasis, dilated CBD EXAM: MRI ABDOMEN WITHOUT AND WITH CONTRAST (INCLUDING MRCP) TECHNIQUE: Multiplanar multisequence MR imaging of the abdomen was performed both before and after the administration of intravenous contrast. Heavily T2-weighted images of the biliary and pancreatic ducts were obtained, and three-dimensional MRCP images were rendered by post processing. CONTRAST:  4mL GADAVIST GADOBUTROL 1 MMOL/ML IV SOLN COMPARISON:  Right upper quadrant ultrasound dated 05/08/2020. CT abdomen dated 02/26/2020 FINDINGS: Motion degraded images. Lower chest: Lung bases are clear. Hepatobiliary: No morphologic findings of cirrhosis. No hepatic steatosis. Mildly heterogeneous early arterial perfusion (series 22/image 37) which normalizes on later phases. No suspicious/enhancing hepatic lesions. Layering gallstones (series 16/image 23), with very mild gallbladder wall thickening/edema (series 3/image 23). No intrahepatic ductal dilatation. Mildly dilated common duct, measuring 10 mm (series 5/image 14), unchanged from prior CT. No choledocholithiasis is seen. Pancreas:  Within normal limits. Spleen:  Within normal limits. Adrenals/Urinary Tract:  Adrenal glands are within normal limits. Tiny bilateral renal cysts measuring up to 5 mm (series 5/image 18 and 22). No hydronephrosis. Stomach/Bowel: Stomach is within normal limits. Visualized bowel is grossly unremarkable. Vascular/Lymphatic:  No evidence of abdominal aortic aneurysm. No suspicious abdominal lymphadenopathy. Other:  No abdominal ascites. Musculoskeletal: No focal osseous lesions. Mild degenerative changes of the lumbar spine with levoscoliosis. IMPRESSION: Motion degraded images. No intrahepatic ductal dilatation. Mildly dilated common duct, measuring 10 mm, unchanged from prior CT. No choledocholithiasis is seen. In the setting of abnormal  LFTs, recent  passage a common duct stone is possible. Given the degree of motion degradation, nonvisualized distal CBD stone or stricture is also possible, although these are not favored given the relative stability from prior CT. Cholelithiasis with very mild gallbladder wall thickening/edema. While very early acute cholecystitis is difficult to exclude, this appearance may be secondary to a right upper quadrant inflammatory process such as hepatitis. Consider follow-up right upper quadrant ultrasound or hepatobiliary nuclear medicine scan if symptoms persist. Electronically Signed   By: Julian Hy M.D.   On: 05/09/2020 12:00   MR ABDOMEN MRCP W WO CONTAST  Result Date: 05/09/2020 CLINICAL DATA:  Abdominal pain cholelithiasis, dilated CBD EXAM: MRI ABDOMEN WITHOUT AND WITH CONTRAST (INCLUDING MRCP) TECHNIQUE: Multiplanar multisequence MR imaging of the abdomen was performed both before and after the administration of intravenous contrast. Heavily T2-weighted images of the biliary and pancreatic ducts were obtained, and three-dimensional MRCP images were rendered by post processing. CONTRAST:  29mL GADAVIST GADOBUTROL 1 MMOL/ML IV SOLN COMPARISON:  Right upper quadrant ultrasound dated 05/08/2020. CT abdomen dated 02/26/2020 FINDINGS: Motion degraded images. Lower chest: Lung bases are clear. Hepatobiliary: No morphologic findings of cirrhosis. No hepatic steatosis. Mildly heterogeneous early arterial perfusion (series 22/image 37) which normalizes on later phases. No suspicious/enhancing hepatic lesions. Layering gallstones (series 16/image 23), with very mild gallbladder wall thickening/edema (series 3/image 23). No intrahepatic ductal dilatation. Mildly dilated common duct, measuring 10 mm (series 5/image 14), unchanged from prior CT. No choledocholithiasis is seen. Pancreas:  Within normal limits. Spleen:  Within normal limits. Adrenals/Urinary Tract:  Adrenal glands are within normal limits. Tiny  bilateral renal cysts measuring up to 5 mm (series 5/image 18 and 22). No hydronephrosis. Stomach/Bowel: Stomach is within normal limits. Visualized bowel is grossly unremarkable. Vascular/Lymphatic:  No evidence of abdominal aortic aneurysm. No suspicious abdominal lymphadenopathy. Other:  No abdominal ascites. Musculoskeletal: No focal osseous lesions. Mild degenerative changes of the lumbar spine with levoscoliosis. IMPRESSION: Motion degraded images. No intrahepatic ductal dilatation. Mildly dilated common duct, measuring 10 mm, unchanged from prior CT. No choledocholithiasis is seen. In the setting of abnormal LFTs, recent passage a common duct stone is possible. Given the degree of motion degradation, nonvisualized distal CBD stone or stricture is also possible, although these are not favored given the relative stability from prior CT. Cholelithiasis with very mild gallbladder wall thickening/edema. While very early acute cholecystitis is difficult to exclude, this appearance may be secondary to a right upper quadrant inflammatory process such as hepatitis. Consider follow-up right upper quadrant ultrasound or hepatobiliary nuclear medicine scan if symptoms persist. Electronically Signed   By: Julian Hy M.D.   On: 05/09/2020 12:00    Anti-infectives: Anti-infectives (From admission, onward)   Start     Dose/Rate Route Frequency Ordered Stop   05/08/20 2030  cefTRIAXone (ROCEPHIN) 2 g in sodium chloride 0.9 % 100 mL IVPB  Status:  Discontinued        2 g 200 mL/hr over 30 Minutes Intravenous Every 24 hours 05/08/20 2017 05/10/20 1241      Assessment/Plan: s/p Procedure(s): LAPAROSCOPIC CHOLECYSTECTOMY WITH INTRAOPERATIVE CHOLANGIOGRAM (N/A)  POD#1 For ERCP today for possible CBD stone seen on intra op C-gram. Ok for diet post procedure   LOS: 3 days    Coralie Keens 05/11/2020

## 2020-05-11 NOTE — Anesthesia Postprocedure Evaluation (Signed)
Anesthesia Post Note  Patient: Gary Gonzalez  Procedure(s) Performed: ENDOSCOPIC RETROGRADE CHOLANGIOPANCREATOGRAPHY (ERCP) (N/A ) SPHINCTEROTOMY REMOVAL OF STONES     Patient location during evaluation: Endoscopy Anesthesia Type: General Level of consciousness: awake and alert Pain management: pain level controlled Vital Signs Assessment: post-procedure vital signs reviewed and stable Respiratory status: spontaneous breathing, nonlabored ventilation and respiratory function stable Cardiovascular status: blood pressure returned to baseline and stable Postop Assessment: no apparent nausea or vomiting Anesthetic complications: no   No complications documented.  Last Vitals:  Vitals:   05/11/20 1320 05/11/20 1330  BP: (!) 142/52 (!) 133/45  Pulse: 75 74  Resp: 19 18  Temp:    SpO2: 99% 96%    Last Pain:  Vitals:   05/11/20 1315  TempSrc: Oral  PainSc: 0-No pain                 Glendola Friedhoff,W. EDMOND

## 2020-05-11 NOTE — Anesthesia Preprocedure Evaluation (Addendum)
Anesthesia Evaluation  Patient identified by MRN, date of birth, ID band Patient awake    Reviewed: Allergy & Precautions, H&P , NPO status , Patient's Chart, lab work & pertinent test results  Airway Mallampati: II  TM Distance: >3 FB Neck ROM: Full    Dental no notable dental hx. (+) Chipped, Dental Advisory Given   Pulmonary    Pulmonary exam normal breath sounds clear to auscultation       Cardiovascular + CAD, + CABG and +CHF  + dysrhythmias  Rhythm:Regular Rate:Normal     Neuro/Psych Depression negative neurological ROS     GI/Hepatic Neg liver ROS, GERD  ,  Endo/Other  Hypothyroidism   Renal/GU Renal InsufficiencyRenal disease  negative genitourinary   Musculoskeletal  (+) Arthritis , Osteoarthritis,  Fibromyalgia -  Abdominal   Peds  Hematology  (+) Blood dyscrasia, anemia ,   Anesthesia Other Findings   Reproductive/Obstetrics negative OB ROS                            Anesthesia Physical Anesthesia Plan  ASA: III  Anesthesia Plan: General   Post-op Pain Management:    Induction: Intravenous  PONV Risk Score and Plan: 3 and Ondansetron, Dexamethasone and Treatment may vary due to age or medical condition  Airway Management Planned: Oral ETT  Additional Equipment:   Intra-op Plan:   Post-operative Plan: Extubation in OR  Informed Consent: I have reviewed the patients History and Physical, chart, labs and discussed the procedure including the risks, benefits and alternatives for the proposed anesthesia with the patient or authorized representative who has indicated his/her understanding and acceptance.     Dental advisory given  Plan Discussed with: CRNA  Anesthesia Plan Comments:         Anesthesia Quick Evaluation

## 2020-05-11 NOTE — Care Management Important Message (Signed)
Important Message  Patient Details IM Letter placed in Patients room Name: Gary Gonzalez MRN: [id number] Date of Birth: 1927-03-13   Medicare Important Message Given:  Yes     [name] 05/11/2020, 11:00 AM

## 2020-05-11 NOTE — Anesthesia Procedure Notes (Signed)
Procedure Name: Intubation Date/Time: 05/11/2020 12:26 PM Performed by: Eulas Post, Emara Lichter W, CRNA Pre-anesthesia Checklist: Patient identified, Emergency Drugs available, Suction available and Patient being monitored Patient Re-evaluated:Patient Re-evaluated prior to induction Oxygen Delivery Method: Circle system utilized Preoxygenation: Pre-oxygenation with 100% oxygen Induction Type: IV induction Ventilation: Mask ventilation without difficulty Laryngoscope Size: Miller and 2 Grade View: Grade II Tube type: Oral Tube size: 7.5 mm Number of attempts: 1 Airway Equipment and Method: Stylet Placement Confirmation: ETT inserted through vocal cords under direct vision,  positive ETCO2 and breath sounds checked- equal and bilateral Secured at: 24 cm Tube secured with: Tape Dental Injury: Teeth and Oropharynx as per pre-operative assessment

## 2020-05-12 ENCOUNTER — Encounter (HOSPITAL_COMMUNITY): Payer: Self-pay | Admitting: Gastroenterology

## 2020-05-12 LAB — CBC WITH DIFFERENTIAL/PLATELET
Abs Immature Granulocytes: 0.05 10*3/uL (ref 0.00–0.07)
Basophils Absolute: 0 10*3/uL (ref 0.0–0.1)
Basophils Relative: 0 %
Eosinophils Absolute: 0 10*3/uL (ref 0.0–0.5)
Eosinophils Relative: 0 %
HCT: 28.2 % — ABNORMAL LOW (ref 39.0–52.0)
Hemoglobin: 9 g/dL — ABNORMAL LOW (ref 13.0–17.0)
Immature Granulocytes: 0 %
Lymphocytes Relative: 4 %
Lymphs Abs: 0.5 10*3/uL — ABNORMAL LOW (ref 0.7–4.0)
MCH: 29.9 pg (ref 26.0–34.0)
MCHC: 31.9 g/dL (ref 30.0–36.0)
MCV: 93.7 fL (ref 80.0–100.0)
Monocytes Absolute: 0.7 10*3/uL (ref 0.1–1.0)
Monocytes Relative: 7 %
Neutro Abs: 9.9 10*3/uL — ABNORMAL HIGH (ref 1.7–7.7)
Neutrophils Relative %: 89 %
Platelets: 248 10*3/uL (ref 150–400)
RBC: 3.01 MIL/uL — ABNORMAL LOW (ref 4.22–5.81)
RDW: 13.5 % (ref 11.5–15.5)
WBC: 11.1 10*3/uL — ABNORMAL HIGH (ref 4.0–10.5)
nRBC: 0 % (ref 0.0–0.2)

## 2020-05-12 LAB — COMPREHENSIVE METABOLIC PANEL
ALT: 146 U/L — ABNORMAL HIGH (ref 0–44)
AST: 67 U/L — ABNORMAL HIGH (ref 15–41)
Albumin: 2.6 g/dL — ABNORMAL LOW (ref 3.5–5.0)
Alkaline Phosphatase: 229 U/L — ABNORMAL HIGH (ref 38–126)
Anion gap: 10 (ref 5–15)
BUN: 19 mg/dL (ref 8–23)
CO2: 23 mmol/L (ref 22–32)
Calcium: 7.8 mg/dL — ABNORMAL LOW (ref 8.9–10.3)
Chloride: 106 mmol/L (ref 98–111)
Creatinine, Ser: 1.2 mg/dL (ref 0.61–1.24)
GFR, Estimated: 57 mL/min — ABNORMAL LOW (ref >60)
Glucose, Bld: 158 mg/dL — ABNORMAL HIGH (ref 70–99)
Potassium: 3.5 mmol/L (ref 3.5–5.1)
Sodium: 139 mmol/L (ref 135–145)
Total Bilirubin: 0.6 mg/dL (ref 0.3–1.2)
Total Protein: 5.8 g/dL — ABNORMAL LOW (ref 6.5–8.1)

## 2020-05-12 MED ORDER — TRAMADOL HCL 50 MG PO TABS: 50.0000 mg | ORAL_TABLET | Freq: Four times a day (QID) | ORAL | 0 refills | Status: AC | PRN

## 2020-05-12 MED ORDER — BISACODYL 10 MG RE SUPP
10.0000 mg | Freq: Once | RECTAL | Status: AC
  Administered 2020-05-12: 10 mg via RECTAL
  Filled 2020-05-12: qty 1

## 2020-05-12 NOTE — Progress Notes (Signed)
1 Day Post-Op   Subjective/Chief Complaint: No complaints - no pain. No BM in 4-5 days. Denies n/v. Tolerating diet   Objective: Vital signs in last 24 hours: Temp:  [97.6 F (36.4 C)-99.1 F (37.3 C)] 97.8 F (36.6 C) (01/13 0601) Pulse Rate:  [65-86] 65 (01/13 0601) Resp:  [15-22] 17 (01/13 0601) BP: (105-174)/(45-66) 105/50 (01/13 0601) SpO2:  [93 %-100 %] 99 % (01/13 0601) Last BM Date: 05/07/20  Intake/Output from previous day: 01/12 0701 - 01/13 0700 In: 1814.6 [P.O.:600; I.V.:1214.6] Out: -  Intake/Output this shift: No intake/output data recorded.  Exam: Awake and alert Abdomen is nontender throughout; nondistended; incisions c/d/i without erythema nor drainage. No palpable hernias  Lab Results:  Recent Labs    05/11/20 0346 05/12/20 0353  WBC 11.5* 11.1*  HGB 9.6* 9.0*  HCT 30.3* 28.2*  PLT 256 248   BMET Recent Labs    05/11/20 2230 05/12/20 0353  NA 136 139  K 3.6 3.5  CL 106 106  CO2 21* 23  GLUCOSE 199* 158*  BUN 19 19  CREATININE 1.25* 1.20  CALCIUM 7.6* 7.8*   PT/INR No results for input(s): LABPROT, INR in the last 72 hours. ABG No results for input(s): PHART, HCO3 in the last 72 hours.  Invalid input(s): PCO2, PO2  Studies/Results: DG Cholangiogram Operative  Result Date: 05/10/2020 CLINICAL DATA:  Cholelithiasis. EXAM: INTRAOPERATIVE CHOLANGIOGRAM TECHNIQUE: Cholangiographic images from the C-arm fluoroscopic device were submitted for interpretation post-operatively. Please see the procedural report for the amount of contrast and the fluoroscopy time utilized. COMPARISON:  None. FINDINGS: Submitted images demonstrate opacification of the biliary tree through cystic duct remnant. There is partial opacification of the pancreatic duct the she sees he is to without significant abnormality. Multiple small filling defects seen within the distal common bile duct most likely related to calculi. There is flow of contrast into the duodenum.  IMPRESSION: Intraoperative cholangiogram as above. Electronically Signed   By: Miachel Roux M.D.   On: 05/10/2020 11:07   DG ERCP BILIARY & PANCREATIC DUCTS  Result Date: 05/11/2020 CLINICAL DATA:  Choledocholithiasis suspected on intraoperative cholangiogram EXAM: ERCP TECHNIQUE: Multiple spot images obtained with the fluoroscopic device and submitted for interpretation post-procedure. COMPARISON:  05/10/2020 FINDINGS: Series of fluoroscopic spot images document endoscopic cannulation and opacification of the CBD with subsequent balloon catheter passage. There is incomplete opacification of the intrahepatic biliary tree, which appears decompressed centrally. Cholecystectomy clips without extravasation. IMPRESSION: Endoscopic CBD cannulation and intervention as above. These images were submitted for radiologic interpretation only. Please see the procedural report for the amount of contrast and the fluoroscopy time utilized. Electronically Signed   By: Lucrezia Europe M.D.   On: 05/11/2020 13:23    Anti-infectives: Anti-infectives (From admission, onward)   Start     Dose/Rate Route Frequency Ordered Stop   05/08/20 2030  cefTRIAXone (ROCEPHIN) 2 g in sodium chloride 0.9 % 100 mL IVPB  Status:  Discontinued        2 g 200 mL/hr over 30 Minutes Intravenous Every 24 hours 05/08/20 2017 05/10/20 1241      Assessment/Plan: s/p Procedure(s): ENDOSCOPIC RETROGRADE CHOLANGIOPANCREATOGRAPHY (ERCP) (N/A) SPHINCTEROTOMY REMOVAL OF STONES  POD#2 Choledocholithiasis noted on ERCP, cleared duct Doing great Dulcolax suppository Possible discharge home later today if doing well  Nadeen Landau, MD [facility] Use [email or web address] to contact on call provider

## 2020-05-12 NOTE — Discharge Instructions (Signed)
[facility]  Please arrive at least 30 min before your appointment to complete your check in paperwork.  If you are unable to arrive 30 min prior to your appointment time we may have to cancel or reschedule you. LAPAROSCOPIC SURGERY: POST OP INSTRUCTIONS Always review your discharge instruction sheet given to you by the facility where your surgery was performed. IF YOU HAVE DISABILITY OR FAMILY LEAVE FORMS, YOU MUST BRING THEM TO THE OFFICE FOR PROCESSING.   DO NOT GIVE THEM TO YOUR DOCTOR.  PAIN CONTROL  1. First take acetaminophen (Tylenol) AND/or ibuprofen (Advil) to control your pain after surgery.  Follow directions on package.  Taking acetaminophen (Tylenol) and/or ibuprofen (Advil) regularly after surgery will help to control your pain and lower the amount of prescription pain medication you may need.  You should not take more than 4,000 mg (4 grams) of acetaminophen (Tylenol) in 24 hours.  You should not take ibuprofen (Advil), aleve, motrin, naprosyn or other NSAIDS if you have a history of stomach ulcers or chronic kidney disease.  2. A prescription for pain medication may be given to you upon discharge.  Take your pain medication as prescribed, if you still have uncontrolled pain after taking acetaminophen (Tylenol) or ibuprofen (Advil). 3. Use ice packs to help control pain. 4. If you need a refill on your pain medication, please contact your pharmacy.  They will contact our office to request authorization. Prescriptions will not be filled after 5pm or on week-ends.  HOME MEDICATIONS 5. Take your usually prescribed medications unless otherwise directed.  DIET 6. You should follow a light diet the first few days after arrival home.  Be sure to include lots of fluids daily. Avoid fatty, fried foods.   CONSTIPATION 7. It is common to experience some constipation after surgery and if you are taking pain medication.  Increasing fluid intake and taking a stool  softener (such as Colace) will usually help or prevent this problem from occurring.  A mild laxative (Milk of Magnesia or Miralax) should be taken according to package instructions if there are no bowel movements after 48 hours.  WOUND/INCISION CARE 8. Most patients will experience some swelling and bruising in the area of the incisions.  Ice packs will help.  Swelling and bruising can take several days to resolve.  9. Unless discharge instructions indicate otherwise, follow guidelines below  a. STERI-STRIPS - you may remove your outer bandages 48 hours after surgery, and you may shower at that time.  You have steri-strips (small skin tapes) in place directly over the incision.  These strips should be left on the skin for 7-10 days.   b. DERMABOND/SKIN GLUE - you may shower in 24 hours.  The glue will flake off over the next 2-3 weeks. 10. Any sutures or staples will be removed at the office during your follow-up visit.  ACTIVITIES 11. You may resume regular (light) daily activities beginning the next day--such as daily self-care, walking, climbing stairs--gradually increasing activities as tolerated.  You may have sexual intercourse when it is comfortable.  Refrain from any heavy lifting or straining until approved by your doctor. a. You may drive when you are no longer taking prescription pain medication, you can comfortably wear a seatbelt, and you can safely maneuver your car and apply brakes.  FOLLOW-UP 12. You should see your doctor in the office for a follow-up appointment approximately 2-3 weeks after your surgery.  You should have been given your post-op/follow-up appointment when   your surgery was scheduled.  If you did not receive a post-op/follow-up appointment, make sure that you call for this appointment within a day or two after you arrive home to insure a convenient appointment time.   WHEN TO CALL YOUR DOCTOR: 1. Fever over 101.0 2. Inability to urinate 3. Continued bleeding from  incision. 4. Increased pain, redness, or drainage from the incision. 5. Increasing abdominal pain  The clinic staff is available to answer your questions during regular business hours.  Please don't hesitate to call and ask to speak to one of the nurses for clinical concerns.  If you have a medical emergency, go to the nearest emergency room or call 911.  A surgeon from [facility] is always on call at the hospital. [address], [address], [place], [place]  [address] ? [address] [address], [place], [place]   [address] [phone] ? [phone] ? FAX [phone]   Gallbladder Eating Plan If you have a gallbladder condition, you may have trouble digesting fats. Eating a low-fat diet can help reduce your symptoms, and may be helpful before and after having surgery to remove your gallbladder (cholecystectomy). Your health care provider may recommend that you work with a diet and nutrition specialist (dietitian) to help you reduce the amount of fat in your diet. What are tips for following this plan? General guidelines  Limit your fat intake to less than 30% of your total daily calories. If you eat around 1,800 calories each day, this is less than 60 grams (g) of fat per day.  Fat is an important part of a healthy diet. Eating a low-fat diet can make it hard to maintain a healthy body weight. Ask your dietitian how much fat, calories, and other nutrients you need each day.  Eat small, frequent meals throughout the day instead of three large meals.  Drink at least 8-10 cups of fluid a day. Drink enough fluid to keep your urine clear or pale yellow.  Limit alcohol intake to no more than 1 drink a day for nonpregnant women and 2 drinks a day for men. One drink equals 12 oz of beer, 5 oz of wine, or 1 oz of hard liquor. Reading food labels  Check Nutrition Facts on food labels for the amount of fat per serving. Choose foods with less than 3 grams of fat per serving.    Shopping  Choose nonfat and low-fat healthy foods. Look for the words "nonfat," "low fat," or "fat free."  Avoid buying processed or prepackaged foods. Cooking  Cook using low-fat methods, such as baking, broiling, grilling, or boiling.  Cook with small amounts of healthy fats, such as olive oil, grapeseed oil, canola oil, or sunflower oil. What foods are recommended?  All fresh, frozen, or canned fruits and vegetables.  Whole grains.  Low-fat or non-fat (skim) milk and yogurt.  Lean meat, skinless poultry, fish, eggs, and beans.  Low-fat protein supplement powders or drinks.  Spices and herbs. What foods are not recommended?  High-fat foods. These include baked goods, fast food, fatty cuts of meat, ice cream, french toast, sweet rolls, pizza, cheese bread, foods covered with butter, creamy sauces, or cheese.  Fried foods. These include french fries, tempura, battered fish, breaded chicken, fried breads, and sweets.  Foods with strong odors.  Foods that cause bloating and gas. Summary  A low-fat diet can be helpful if you have a gallbladder condition, or before and after gallbladder surgery.  Limit your fat intake to   less than 30% of your total daily calories. This is about 60 g of fat if you eat 1,800 calories each day.  Eat small, frequent meals throughout the day instead of three large meals. This information is not intended to replace advice given to you by your health care provider. Make sure you discuss any questions you have with your health care provider. Document Revised: 12/03/2019 Document Reviewed: 12/03/2019 Elsevier Patient Education  2021 [employer]  

## 2020-05-12 NOTE — Progress Notes (Signed)
[facility] Gastroenterology Progress Note  Gary Gonzalez 87 y.o. 13-Nov-1926  CC:  Choledocholithiasis s/p ERCP 1/12   Subjective: Patient reports feeling well.  Denies any abdominal pain, nausea, or vomiting.  Tolerated a breakfast of oatmeal and an English muffin.  Has not had a bowel movement since approximately Sunday.  ROS : Review of Systems  Cardiovascular: Negative for chest pain and palpitations.  Gastrointestinal: Positive for constipation. Negative for abdominal pain, blood in stool, diarrhea, heartburn, melena, nausea and vomiting.   Objective: Vital signs in last 24 hours: Vitals:   05/12/20 0212 05/12/20 0601  BP: (!) 127/57 (!) 105/50  Pulse: 66 65  Resp: 18 17  Temp: 97.6 F (36.4 C) 97.8 F (36.6 C)  SpO2: 97% 99%    Physical Exam:  General:  Alert, oriented, cooperative, no distress  Head:  Normocephalic, without obvious abnormality, atraumatic  Eyes:  Anicteric sclera, EOMs intact   Lungs:   Respirations unlabored, breathing comfortably on room air  Heart:  Regular rate and rhythm  Abdomen:   Soft with mild incision site tenderness but no overt tenderness, no guarding or peritoneal signs.  Incision sites clean and intact, approximated with surgical glue.  Extremities: Extremities normal, atraumatic, no  edema  Pulses: 2+ and symmetric    Lab Results: Recent Labs    05/11/20 2230 05/12/20 0353  NA 136 139  K 3.6 3.5  CL 106 106  CO2 21* 23  GLUCOSE 199* 158*  BUN 19 19  CREATININE 1.25* 1.20  CALCIUM 7.6* 7.8*   Recent Labs    05/11/20 2230 05/12/20 0353  AST 75* 67*  ALT 148* 146*  ALKPHOS 205* 229*  BILITOT 0.7 0.6  PROT 5.5* 5.8*  ALBUMIN 2.5* 2.6*   Recent Labs    05/11/20 0346 05/12/20 0353  WBC 11.5* 11.1*  NEUTROABS  --  9.9*  HGB 9.6* 9.0*  HCT 30.3* 28.2*  MCV 94.7 93.7  PLT 256 248   No results for input(s): LABPROT, INR in the last 72 hours.   Assessment: Gallstone pancreatitis and choledocholithiasis s/p ERCP 1/12 and  lap chole 1/11 -LFTs stable, T. Bili 0.6/ AST 67/ ALT 146/ ALP 229 -WBCs trending down, now 11.1  Constipation, no BM since ~1/9.  Bisacodyl suppository given this morning.  Plan: OK to discharge from a GI standpoint.  Repeat CMP in 1-2 weeks, which can be arranged at our office/lab on same day as outpatient surgical follow-up.  Patient advised to call our office if he has any questions or concerns.  [facility] will sign off.  Please contact us if we can be of any further assistance during this hospital stay.  Salley Slaughter PA-C 05/12/2020, 10:34 AM  Contact #  [phone]

## 2020-05-12 NOTE — Progress Notes (Signed)
Physician Discharge Summary  Patient ID: Gary Gonzalez MRN: [id number] DOB/AGE: Jan 31, 1927 87 y.o.  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 Coralie Keens  Endoscopic retrograde cholangiopancreatography (ERCP); with removal of calculi/debris from biliary/pancreatic duct(s),sphincterotomy/papillotomy 05/11/2020 Dr. Altamese Dilling [facility] Course:  Patient presents emergency room with a 1 day history of severe epigastric and right upper quadrant pain.  He has been seen by Dr. Zenia Resides at [facility] surgery and evaluated for gallstones.  He was cleared by his cardiologist Dr. [name] to proceed with laparoscopic cholecystectomy hopefully sometime next month.  Unfortunately, he developed sharp right upper quadrant pain with nausea and vomiting which lasted for the last 18 hours.  It is not improved.  He was brought in by EMS and evaluated.  He is more comfortable now after pain medication.  Of note he does have significantly elevated liver function studies with his AST and ALT of being over 400, his total bilirubin to be 3.7 and his alk phos of 440. Ultrasound shows gallstones without obvious gallbladder wall thickening but the common bile duct is 6.3 mm which is mildly enlarged.  Patient was seen and admitted by Dr. Brantley Stage and underwent laparoscopic cholecystectomy with intraoperative cholangiogram by Dr. Ninfa Linden on 05/10/2020.  He was found to have an acutely inflamed gallbladder  with gallstones cholangiogram was performed the bile duct and pancreatic duct were dilated.  Contrast flowed easily into the duodenum was difficult to tell whether there could be a distal stone versus an air bubble.  He returned to the floor and was seen in consultation by Dr. Clarene Essex.  He was scheduled and underwent ERCP, with removal of stones and sphincterotomy.  He tolerated both procedures well.  LFTs are returning to normal.  He was tolerating diet mobilizing well.  He was ready for discharge in the afternoon of 05/12/2020.  Follow-up as listed below.  Condition on discharge: Improved  CBC Latest Ref Rng & Units 05/12/2020 05/11/2020 05/10/2020  WBC 4.0 - 10.5 K/uL 11.1(H) 11.5(H) 11.6(H)  Hemoglobin 13.0 - 17.0 g/dL 9.0(L) 9.6(L) 9.8(L)  Hematocrit 39.0 - 52.0 % 28.2(L) 30.3(L) 30.4(L)  Platelets 150 - 400 K/uL 248 256 260   CMP Latest Ref Rng & Units 05/12/2020 05/11/2020 05/11/2020  Glucose 70 - 99 mg/dL 158(H) 199(H) 105(H)  BUN 8 - 23 mg/dL 19 19 14   Creatinine 0.61 - 1.24 mg/dL 1.20 1.25(H) 1.13  Sodium 135 - 145 mmol/L 139 136 140  Potassium 3.5 - 5.1 mmol/L 3.5 3.6 3.4(L)  Chloride 98 - 111 mmol/L 106 106 107  CO2 22 - 32 mmol/L 23 21(L) 24  Calcium 8.9 - 10.3 mg/dL 7.8(L) 7.6(L) 8.0(L)  Total Protein 6.5 - 8.1 g/dL 5.8(L) 5.5(L) 6.1(L)  Total Bilirubin 0.3 - 1.2 mg/dL 0.6 0.7 0.8  Alkaline Phos 38 - 126 U/L 229(H) 205(H) 265(H)  AST 15 - 41 U/L 67(H) 75(H) 116(H)  ALT 0 - 44 U/L 146(H) 148(H) 210(H)    Disposition: Discharge disposition: 01-Home or Self Care        Allergies as of 05/12/2020      Reactions   Atenolol  Diarrhea, Other (See Comments)   Very weak, fatigued, lethargic, no appetite      Medication List    TAKE these medications   aspirin 81 MG tablet Take 81 mg by mouth daily.   ezetimibe 10 MG tablet Commonly known as: ZETIA Take 10 mg by mouth daily.   isosorbide mononitrate 30 MG 24 hr tablet Commonly known as: IMDUR Take 30 mg by mouth  daily.   isosorbide mononitrate 30 MG 24 hr tablet Commonly known as: IMDUR Take 1 tablet (30 mg total) by mouth daily.   levothyroxine 125 MCG tablet Commonly known as: SYNTHROID Take 1 tablet by mouth daily before breakfast.   mirtazapine 15 MG tablet Commonly known as: REMERON Take 15 mg by mouth at bedtime.   nitroGLYCERIN 0.4 MG SL tablet Commonly known as: NITROSTAT Place 0.4 mg under the tongue every 5 (five) minutes as needed for chest pain.   nitroGLYCERIN 0.4 MG SL tablet Commonly known as: NITROSTAT Place 1 tablet (0.4 mg total) under the tongue every 5 (five) minutes as needed for chest pain.   omeprazole 20 MG capsule Commonly known as: PRILOSEC Take 20 mg by mouth daily.   Repatha SureClick 281 MG/ML Soaj Generic drug: Evolocumab Inject 140 mg into the skin every 28 (twenty-eight) days.   traMADol 50 MG tablet Commonly known as: Ultram Take 1 tablet (50 mg total) by mouth every 6 (six) hours as needed for up to 5 days (pain not controlled with tylenol).       Follow-up Information    Surgery, [place]. Go on 06/02/2020.   Specialty: General Surgery Why: @130pm . Please arrive 30 minutes prior to your appointment for paperwork. Please bring a copy of your photo ID and insurance card.  Contact information: [place] [address] [phone]               Signed: Earnstine Regal 05/12/2020, 3:30 PM

## 2020-05-12 NOTE — Progress Notes (Signed)
PROGRESS NOTE    Gary Gonzalez  NWG:[id number] DOB: [birth year] DOA: 05/08/2020 PCP: [name], MD   Chief Complain: Abdominal pain  Brief Narrative: Patient is a 87 year old male with history of coronary disease, PVC induced cardiomyopathy, hypothyroidism who initially presented with abdomen pain, nausea.  On presentation he was found to have elevated liver enzymes, severely elevated lipase.  Right upper quadrant ultrasound showed cholelithiasis.  He was admitted under general surgery service for the management of gallstone pancreatitis/cholecystitis. Underwent for lap cholecystectomy with IOC and also ERCP for choledocholithiasis.  TRH consulted for medical management.Plan for DC today  Assessment & Plan:   Principal Problem:   Acute cholecystitis Active Problems:   CAD (coronary artery disease)   Hypothyroidism (acquired)   Acute cholecystitis/gallstone pancreatitis:   Elevated LFTs, lipase on presentation.  MRCP showed mildly dilated CBD with size of 10 mm, no choledocholithiasis, cholelithiasis with mild gallbladder wall thickening.  Underwent  lap cholecystectomy with IOC. liver enzymes, lipase improving.  Mild leukocytosis present.GI was also on board and he underwent  ERCP with removal of CBD stones. Management as per general surgery  Coronary artery disease: Status post CABG.  Denies any chest pain.  On Imdur, aspirin at home.  Follows with cardiology.  No further work-up planned at this admission.  History of PVC induced cardiomyopathy:Currently stable.  Follows with cardiology.  Continue current medications  Hypothyroidism: Continue Synthyroid  Normocytic anemia: Hemoglobin in the range of 9 today.  It has dropped since admission but there is no evidence of bleeding.  Could be secondary to hemodilution.  Will recommend to follow-up with PCP as an outpatient to monitor hemoglobin.Check CBC in a week  TRH will sign off         DVT prophylaxis:Lovenox Code Status:  Full    Antimicrobials:  Anti-infectives (From admission, onward)   Start     Dose/Rate Route Frequency Ordered Stop   05/08/20 2030  cefTRIAXone (ROCEPHIN) 2 g in sodium chloride 0.9 % 100 mL IVPB  Status:  Discontinued        2 g 200 mL/hr over 30 Minutes Intravenous Every 24 hours 05/08/20 2017 05/10/20 1241      Subjective: Patient seen and examined the bedside this morning.  Hemodynamically stable.  Comfortable.  Denies any complaints.  Objective: Vitals:   05/11/20 1800 05/11/20 2153 05/12/20 0212 05/12/20 0601  BP: (!) 136/59 (!) 111/48 (!) 127/57 (!) 105/50  Pulse: 77 69 66 65  Resp: 18 15 18 17   Temp: 97.9 F (36.6 C) 97.8 F (36.6 C) 97.6 F (36.4 C) 97.8 F (36.6 C)  TempSrc: Oral Oral Oral Oral  SpO2: 96% 95% 97% 99%  Weight:      Height:        Intake/Output Summary (Last 24 hours) at 05/12/2020 1103 Last data filed at 05/12/2020 0600 Gross per 24 hour  Intake 1814.57 ml  Output -  Net 1814.57 ml   Filed Weights   05/08/20 1532  Weight: 66 kg    Examination:  General exam: Appears calm and comfortable ,Not in distress,average built HEENT:PERRL,Oral mucosa moist, Ear/Nose normal on gross exam Respiratory system: Bilateral equal air entry, normal vesicular breath sounds, no wheezes or crackles  Cardiovascular system: S1 & S2 heard, RRR. No JVD, murmurs, rubs, gallops or clicks. Gastrointestinal system: Abdomen is nondistended, soft and nontender. No organomegaly or masses felt. Normal bowel sounds heard. Central nervous system: Alert and oriented. No focal neurological deficits. Extremities: No edema, no clubbing ,no cyanosis,  distal peripheral pulses palpable. Skin: No rashes, lesions or ulcers,no icterus ,no pallor   Data Reviewed: I have personally reviewed following labs and imaging studies  CBC: Recent Labs  Lab 05/08/20 1539 05/09/20 0809 05/10/20 0220 05/11/20 0346 05/12/20 0353  WBC 10.5 12.7* 11.6* 11.5* 11.1*  NEUTROABS 8.8*  --    --   --  9.9*  HGB 12.3* 10.8* 9.8* 9.6* 9.0*  HCT 37.4* 32.9* 30.4* 30.3* 28.2*  MCV 91.4 92.9 93.5 94.7 93.7  PLT 276 266 260 256 [id number]   Basic Metabolic Panel: Recent Labs  Lab 05/09/20 0809 05/10/20 0220 05/11/20 1003 05/11/20 2230 05/12/20 0353  NA 138 138 140 136 139  K 4.4 3.7 3.4* 3.6 3.5  CL 105 105 107 106 106  CO2 25 24 24  21* 23  GLUCOSE 111* 97 105* 199* 158*  BUN 16 19 14 19 19   CREATININE 1.18 1.18 1.13 1.25* 1.20  CALCIUM 8.4* 8.3* 8.0* 7.6* 7.8*   GFR: Estimated Creatinine Clearance: 37.4 mL/min (by C-G formula based on SCr of 1.2 mg/dL). Liver Function Tests: Recent Labs  Lab 05/09/20 0809 05/10/20 0220 05/11/20 1003 05/11/20 2230 05/12/20 0353  AST 322* 189* 116* 75* 67*  ALT 538* 434* 210* 148* 146*  ALKPHOS 401* 370* 265* 205* 229*  BILITOT 1.7* 1.2 0.8 0.7 0.6  PROT 6.3* 6.4* 6.1* 5.5* 5.8*  ALBUMIN 3.1* 3.0* 2.8* 2.5* 2.6*   Recent Labs  Lab 05/08/20 1539 05/09/20 0809 05/10/20 0220  LIPASE >10,000* 946* 217*   No results for input(s): AMMONIA in the last 168 hours. Coagulation Profile: Recent Labs  Lab 05/08/20 1539  INR 1.0   Cardiac Enzymes: No results for input(s): CKTOTAL, CKMB, CKMBINDEX, TROPONINI in the last 168 hours. BNP (last 3 results) No results for input(s): PROBNP in the last 8760 hours. HbA1C: No results for input(s): HGBA1C in the last 72 hours. CBG: Recent Labs  Lab 05/09/20 2135  GLUCAP 117*   Lipid Profile: No results for input(s): CHOL, HDL, LDLCALC, TRIG, CHOLHDL, LDLDIRECT in the last 72 hours. Thyroid Function Tests: No results for input(s): TSH, T4TOTAL, FREET4, T3FREE, THYROIDAB in the last 72 hours. Anemia Panel: No results for input(s): VITAMINB12, FOLATE, FERRITIN, TIBC, IRON, RETICCTPCT in the last 72 hours. Sepsis Labs: No results for input(s): PROCALCITON, LATICACIDVEN in the last 168 hours.  Recent Results (from the past 240 hour(s))  SARS CORONAVIRUS 2 (TAT 6-24 HRS) Nasopharyngeal  Nasopharyngeal Swab     Status: None   Collection Time: 05/08/20  5:27 PM   Specimen: Nasopharyngeal Swab  Result Value Ref Range Status   SARS Coronavirus 2 NEGATIVE NEGATIVE Final    Comment: (NOTE) SARS-CoV-2 target nucleic acids are NOT DETECTED.  The SARS-CoV-2 RNA is generally detectable in upper and lower respiratory specimens during the acute phase of infection. Negative results do not preclude SARS-CoV-2 infection, do not rule out co-infections with other pathogens, and should not be used as the sole basis for treatment or other patient management decisions. Negative results must be combined with clinical observations, patient history, and epidemiological information. The expected result is Negative.  Fact Sheet for Patients: SugarRoll.be  Fact Sheet for Healthcare Providers: https://www.woods-mathews.com/  This test is not yet approved or cleared by the [place] FDA and  has been authorized for detection and/or diagnosis of SARS-CoV-2 by FDA under an Emergency Use Authorization (EUA). This EUA will remain  in effect (meaning this test can be used) for the duration of the COVID-19 declaration under Se  ction 564(b)(1) of the Act, 21 U.S.C. section 360bbb-3(b)(1), unless the authorization is terminated or revoked sooner.  Performed at [facility], [place] [address] [place], [place] [address]   Surgical pcr screen     Status: None   Collection Time: 05/09/20  4:53 AM   Specimen: Nasal Mucosa; Nasal Swab  Result Value Ref Range Status   MRSA, PCR NEGATIVE NEGATIVE Final   Staphylococcus aureus NEGATIVE NEGATIVE Final    Comment: (NOTE) The Xpert SA Assay (FDA approved for NASAL specimens in patients 19 years of age and older), is one component of a comprehensive surveillance program. It is not intended to diagnose infection nor to guide or monitor treatment. Performed at [facility], [place]  [address] [place], [place] [address]          Radiology Studies: DG ERCP BILIARY & PANCREATIC DUCTS  Result Date: 05/11/2020 CLINICAL DATA:  Choledocholithiasis suspected on intraoperative cholangiogram EXAM: ERCP TECHNIQUE: Multiple spot images obtained with the fluoroscopic device and submitted for interpretation post-procedure. COMPARISON:  05/10/2020 FINDINGS: Series of fluoroscopic spot images document endoscopic cannulation and opacification of the CBD with subsequent balloon catheter passage. There is incomplete opacification of the intrahepatic biliary tree, which appears decompressed centrally. Cholecystectomy clips without extravasation. IMPRESSION: Endoscopic CBD cannulation and intervention as above. These images were submitted for radiologic interpretation only. Please see the procedural report for the amount of contrast and the fluoroscopy time utilized. Electronically Signed   By: Lucrezia Europe M.D.   On: 05/11/2020 13:23        Scheduled Meds: . enoxaparin (LOVENOX) injection  40 mg Subcutaneous Q24H  . feeding supplement  1 Container Oral TID BM  . isosorbide mononitrate  30 mg Oral Daily  . levothyroxine  125 mcg Oral QAC breakfast  . mirtazapine  15 mg Oral QHS  . pantoprazole  40 mg Oral Daily   Continuous Infusions: . sodium chloride    . dextrose 5 % and 0.9% NaCl 75 mL/hr at 05/11/20 1929     LOS: 4 days    Time spent:25 mins. More than 50% of that time was spent in counseling and/or coordination of care.      Shelly Coss, MD [facility] P1/13/2022, 11:03 AM

## 2020-05-13 ENCOUNTER — Emergency Department (HOSPITAL_COMMUNITY): Payer: PPO

## 2020-05-13 ENCOUNTER — Inpatient Hospital Stay (HOSPITAL_COMMUNITY)
Admission: EM | Admit: 2020-05-13 | Discharge: 2020-05-16 | DRG: 391 | Disposition: A | Discharge status: Home / Self Care | Payer: PPO | Attending: Family Medicine | Admitting: Family Medicine

## 2020-05-13 ENCOUNTER — Encounter (HOSPITAL_COMMUNITY): Payer: Self-pay | Admitting: Emergency Medicine

## 2020-05-13 ENCOUNTER — Other Ambulatory Visit: Payer: Self-pay

## 2020-05-13 DIAGNOSIS — E785 Hyperlipidemia, unspecified: Secondary | ICD-10-CM | POA: Diagnosis present

## 2020-05-13 DIAGNOSIS — K5732 Diverticulitis of large intestine without perforation or abscess without bleeding: Principal | ICD-10-CM | POA: Diagnosis present

## 2020-05-13 DIAGNOSIS — R509 Fever, unspecified: Secondary | ICD-10-CM

## 2020-05-13 DIAGNOSIS — I5042 Chronic combined systolic (congestive) and diastolic (congestive) heart failure: Secondary | ICD-10-CM | POA: Diagnosis present

## 2020-05-13 DIAGNOSIS — Z79890 Hormone replacement therapy (postmenopausal): Secondary | ICD-10-CM

## 2020-05-13 DIAGNOSIS — I429 Cardiomyopathy, unspecified: Secondary | ICD-10-CM | POA: Diagnosis present

## 2020-05-13 DIAGNOSIS — D509 Iron deficiency anemia, unspecified: Secondary | ICD-10-CM | POA: Diagnosis present

## 2020-05-13 DIAGNOSIS — R112 Nausea with vomiting, unspecified: Secondary | ICD-10-CM | POA: Diagnosis present

## 2020-05-13 DIAGNOSIS — Z961 Presence of intraocular lens: Secondary | ICD-10-CM | POA: Diagnosis present

## 2020-05-13 DIAGNOSIS — Z9842 Cataract extraction status, left eye: Secondary | ICD-10-CM

## 2020-05-13 DIAGNOSIS — U071 COVID-19: Secondary | ICD-10-CM | POA: Diagnosis present

## 2020-05-13 DIAGNOSIS — Z951 Presence of aortocoronary bypass graft: Secondary | ICD-10-CM

## 2020-05-13 DIAGNOSIS — R197 Diarrhea, unspecified: Secondary | ICD-10-CM | POA: Diagnosis not present

## 2020-05-13 DIAGNOSIS — K5792 Diverticulitis of intestine, part unspecified, without perforation or abscess without bleeding: Secondary | ICD-10-CM | POA: Diagnosis not present

## 2020-05-13 DIAGNOSIS — Z743 Need for continuous supervision: Secondary | ICD-10-CM | POA: Diagnosis not present

## 2020-05-13 DIAGNOSIS — M19049 Primary osteoarthritis, unspecified hand: Secondary | ICD-10-CM | POA: Diagnosis present

## 2020-05-13 DIAGNOSIS — E876 Hypokalemia: Secondary | ICD-10-CM | POA: Diagnosis present

## 2020-05-13 DIAGNOSIS — R7989 Other specified abnormal findings of blood chemistry: Secondary | ICD-10-CM | POA: Diagnosis present

## 2020-05-13 DIAGNOSIS — R109 Unspecified abdominal pain: Secondary | ICD-10-CM | POA: Diagnosis not present

## 2020-05-13 DIAGNOSIS — Z9049 Acquired absence of other specified parts of digestive tract: Secondary | ICD-10-CM | POA: Diagnosis not present

## 2020-05-13 DIAGNOSIS — E039 Hypothyroidism, unspecified: Secondary | ICD-10-CM | POA: Diagnosis present

## 2020-05-13 DIAGNOSIS — N1831 Chronic kidney disease, stage 3a: Secondary | ICD-10-CM | POA: Diagnosis present

## 2020-05-13 DIAGNOSIS — G4730 Sleep apnea, unspecified: Secondary | ICD-10-CM | POA: Diagnosis present

## 2020-05-13 DIAGNOSIS — I2510 Atherosclerotic heart disease of native coronary artery without angina pectoris: Secondary | ICD-10-CM | POA: Diagnosis present

## 2020-05-13 DIAGNOSIS — Z79899 Other long term (current) drug therapy: Secondary | ICD-10-CM

## 2020-05-13 DIAGNOSIS — I2583 Coronary atherosclerosis due to lipid rich plaque: Secondary | ICD-10-CM | POA: Diagnosis not present

## 2020-05-13 DIAGNOSIS — Z888 Allergy status to other drugs, medicaments and biological substances status: Secondary | ICD-10-CM

## 2020-05-13 DIAGNOSIS — R6889 Other general symptoms and signs: Secondary | ICD-10-CM | POA: Diagnosis not present

## 2020-05-13 DIAGNOSIS — R1084 Generalized abdominal pain: Secondary | ICD-10-CM | POA: Diagnosis not present

## 2020-05-13 DIAGNOSIS — M353 Polymyalgia rheumatica: Secondary | ICD-10-CM | POA: Diagnosis present

## 2020-05-13 DIAGNOSIS — Z85820 Personal history of malignant melanoma of skin: Secondary | ICD-10-CM | POA: Diagnosis not present

## 2020-05-13 DIAGNOSIS — Z9841 Cataract extraction status, right eye: Secondary | ICD-10-CM | POA: Diagnosis not present

## 2020-05-13 DIAGNOSIS — I493 Ventricular premature depolarization: Secondary | ICD-10-CM | POA: Diagnosis present

## 2020-05-13 DIAGNOSIS — I447 Left bundle-branch block, unspecified: Secondary | ICD-10-CM | POA: Diagnosis present

## 2020-05-13 DIAGNOSIS — Z7982 Long term (current) use of aspirin: Secondary | ICD-10-CM

## 2020-05-13 DIAGNOSIS — N183 Chronic kidney disease, stage 3 unspecified: Secondary | ICD-10-CM | POA: Diagnosis present

## 2020-05-13 LAB — CBC WITH DIFFERENTIAL/PLATELET
Abs Immature Granulocytes: 0.09 10*3/uL — ABNORMAL HIGH (ref 0.00–0.07)
Basophils Absolute: 0 10*3/uL (ref 0.0–0.1)
Basophils Relative: 0 %
Eosinophils Absolute: 0 10*3/uL (ref 0.0–0.5)
Eosinophils Relative: 0 %
HCT: 31.1 % — ABNORMAL LOW (ref 39.0–52.0)
Hemoglobin: 10 g/dL — ABNORMAL LOW (ref 13.0–17.0)
Immature Granulocytes: 1 %
Lymphocytes Relative: 3 %
Lymphs Abs: 0.4 10*3/uL — ABNORMAL LOW (ref 0.7–4.0)
MCH: 30.1 pg (ref 26.0–34.0)
MCHC: 32.2 g/dL (ref 30.0–36.0)
MCV: 93.7 fL (ref 80.0–100.0)
Monocytes Absolute: 1.2 10*3/uL — ABNORMAL HIGH (ref 0.1–1.0)
Monocytes Relative: 8 %
Neutro Abs: 13.9 10*3/uL — ABNORMAL HIGH (ref 1.7–7.7)
Neutrophils Relative %: 88 %
Platelets: 350 10*3/uL (ref 150–400)
RBC: 3.32 MIL/uL — ABNORMAL LOW (ref 4.22–5.81)
RDW: 13.7 % (ref 11.5–15.5)
WBC: 15.7 10*3/uL — ABNORMAL HIGH (ref 4.0–10.5)
nRBC: 0 % (ref 0.0–0.2)

## 2020-05-13 LAB — LIPASE, BLOOD: Lipase: 45 U/L (ref 11–51)

## 2020-05-13 LAB — COMPREHENSIVE METABOLIC PANEL
ALT: 98 U/L — ABNORMAL HIGH (ref 0–44)
AST: 42 U/L — ABNORMAL HIGH (ref 15–41)
Albumin: 2.7 g/dL — ABNORMAL LOW (ref 3.5–5.0)
Alkaline Phosphatase: 196 U/L — ABNORMAL HIGH (ref 38–126)
Anion gap: 13 (ref 5–15)
BUN: 21 mg/dL (ref 8–23)
CO2: 23 mmol/L (ref 22–32)
Calcium: 8 mg/dL — ABNORMAL LOW (ref 8.9–10.3)
Chloride: 105 mmol/L (ref 98–111)
Creatinine, Ser: 1.19 mg/dL (ref 0.61–1.24)
GFR, Estimated: 58 mL/min — ABNORMAL LOW (ref >60)
Glucose, Bld: 107 mg/dL — ABNORMAL HIGH (ref 70–99)
Potassium: 3.2 mmol/L — ABNORMAL LOW (ref 3.5–5.1)
Sodium: 141 mmol/L (ref 135–145)
Total Bilirubin: 1 mg/dL (ref 0.3–1.2)
Total Protein: 6 g/dL — ABNORMAL LOW (ref 6.5–8.1)

## 2020-05-13 LAB — LACTIC ACID, PLASMA: Lactic Acid, Venous: 1.3 mmol/L (ref 0.5–1.9)

## 2020-05-13 MED ORDER — IOHEXOL 300 MG/ML  SOLN
100.0000 mL | Freq: Once | INTRAMUSCULAR | Status: AC | PRN
  Administered 2020-05-13: 100 mL via INTRAVENOUS

## 2020-05-13 MED ORDER — SODIUM CHLORIDE 0.9 % IV SOLN: INTRAVENOUS | Status: DC

## 2020-05-13 MED ORDER — SODIUM CHLORIDE 0.9 % IV BOLUS
1000.0000 mL | Freq: Once | INTRAVENOUS | Status: AC
  Administered 2020-05-13: 1000 mL via INTRAVENOUS

## 2020-05-13 NOTE — ED Triage Notes (Addendum)
Pt BIB EMS from home. Pt had cholecystomy on Tuesday, endoscopy on Wednesday. Pt now c/o diarrhea, weakness, emesis, and lower abd cramping that began today. Denies pain. Family reports fevers at home with relief from tylenol. 4mg  Zofran given by EMS. 20G LFA.

## 2020-05-13 NOTE — ED Notes (Signed)
Patient transported to X-ray 

## 2020-05-13 NOTE — ED Provider Notes (Signed)
[facility] DEPT Provider Note   CSN: [id number] Arrival date & time: 05/13/20  2006     History Chief Complaint  Patient presents with  . Abdominal Pain  . Emesis  . Weakness  . Diarrhea    Gary Gonzalez is a 87 y.o. male.  HPI   Patient was admitted to the hospital on January 9 for cholecystitis.  Patient had a laparoscopic cholecystectomy and ERCP on January 11.  Patient was discharged from the hospital on January 13.  Patient initially had been doing well until today when he began experiencing frequent bouts of diarrhea and abdominal cramping.  Patient has had multiple episodes of nausea vomiting.  Patient also started to have a fever up to 100.  Right now the cramping has resolved but earlier it was severe.  Family spoke with the surgeon on-call and they instructed him to come to the ED.  Past Medical History:  Diagnosis Date  . Anemia   . Arthritis    "touch in the fingers" (11/17/2014)  . Chronic combined systolic and diastolic CHF (congestive heart failure) ([place])    a. 11/17/14 showed EF 25-30%, suboptimal image quality, diffuse hypokinesis worse in the inferior wall, mild to moderate MR. New drop EF compared to 2012 but cath was stable.  . CKD (chronic kidney disease), stage III ([place])   . Coronary artery disease    a. s/p 2v CABG (seq LIMA to Diag and LAD, SVG to OM) on 05/15/2001.  . Fibromyalgia    "a long time ago" (11/17/2014)  . History of gout   . Hyperlipidemia   . Hypothyroidism   . LBBB (left bundle branch block)   . Melanoma of back ([place])   . NSVT (nonsustained ventricular tachycardia) ([place])   . PMR (polymyalgia rheumatica) ([facility])   . PVC's (premature ventricular contractions)   . Sleep apnea    "wife says I do" (11/18/2014)    Patient Active Problem List   Diagnosis Date Noted  . Acute cholecystitis 05/08/2020  . Hypothyroidism (acquired) 10/29/2016  . Depression 10/29/2016  . Chest pain 10/29/2016  . Elevated blood  pressure reading 10/29/2016  . Chronic combined systolic and diastolic CHF (congestive heart failure) ([place])   . S/P CABG x 2   . CKD (chronic kidney disease), stage III ([place])   . Fatigue 11/11/2014  . NSVT (nonsustained ventricular tachycardia) ([place]) 11/11/2014  . LV dysfunction 07/23/2014  . Hyperlipidemia 07/27/2013  . Dizzy spells 10/24/2011  . CAD (coronary artery disease) 03/30/2011  . PVC's (premature ventricular contractions) 03/30/2011  . ANEMIA, IRON DEFICIENCY 03/12/2008  . GERD 03/12/2008    Past Surgical History:  Procedure Laterality Date  . AXILLARY LYMPH NODE DISSECTION Bilateral 2001  . CARDIAC CATHETERIZATION  04/28/2001  . CARDIAC CATHETERIZATION N/A 11/19/2014   Procedure: Right/Left Heart Cath and Coronary/Graft Angiography;  Surgeon: Jettie Booze, MD;  Location: [facility];  Service: Cardiovascular;  Laterality: N/A;  . CATARACT EXTRACTION W/ INTRAOCULAR LENS  IMPLANT, BILATERAL Bilateral ~ 2010  . CHOLECYSTECTOMY N/A 05/10/2020   Procedure: LAPAROSCOPIC CHOLECYSTECTOMY WITH INTRAOPERATIVE CHOLANGIOGRAM;  Surgeon: Coralie Keens, MD;  Location: [facility];  Service: General;  Laterality: N/A;  . CORONARY ARTERY BYPASS GRAFT  05/15/2001   x3 sequential LIMA to diag and LAD, SVG to OM   . ERCP N/A 05/11/2020   Procedure: ENDOSCOPIC RETROGRADE CHOLANGIOPANCREATOGRAPHY (ERCP);  Surgeon: Clarene Essex, MD;  Location: [facility];  Service: Endoscopy;  Laterality: N/A;  . MELANOMA EXCISION  2001   off of back  . REMOVAL OF STONES  05/11/2020   Procedure: REMOVAL OF STONES;  Surgeon: Clarene Essex, MD;  Location: [facility];  Service: Endoscopy;;  . Joan Mayans  05/11/2020   Procedure: Joan Mayans;  Surgeon: Clarene Essex, MD;  Location: [facility];  Service: Endoscopy;;       Family History  Problem Relation Age of Onset  . Prostate cancer Father   . Liver cancer Mother   . Hyperlipidemia Son   . Hyperlipidemia Daughter     Social History    Tobacco Use  . Smoking status: Never Smoker  . Smokeless tobacco: Never Used  Substance Use Topics  . Alcohol use: Yes    Alcohol/week: 6.0 standard drinks    Types: 3 Glasses of wine, 3 Cans of beer per week  . Drug use: No    Home Medications Prior to Admission medications   Medication Sig Start Date End Date Taking? Authorizing Provider  aspirin 81 MG tablet Take 81 mg by mouth daily.    [provider]  Evolocumab (REPATHA SURECLICK) 130 MG/ML SOAJ Inject 140 mg into the skin every 28 (twenty-eight) days.    [provider]  ezetimibe (ZETIA) 10 MG tablet Take 10 mg by mouth daily.    [provider]  isosorbide mononitrate (IMDUR) 30 MG 24 hr tablet Take 1 tablet (30 mg total) by mouth daily. 07/30/19 10/28/19  Shirley Friar, PA-C  isosorbide mononitrate (IMDUR) 30 MG 24 hr tablet Take 30 mg by mouth daily.    [provider]  levothyroxine (SYNTHROID, LEVOTHROID) 125 MCG tablet Take 1 tablet by mouth daily before breakfast. 04/13/16   [provider]  mirtazapine (REMERON) 15 MG tablet Take 15 mg by mouth at bedtime.    [provider]  nitroGLYCERIN (NITROSTAT) 0.4 MG SL tablet Place 1 tablet (0.4 mg total) under the tongue every 5 (five) minutes as needed for chest pain. 07/30/19 10/28/19  Shirley Friar, PA-C  nitroGLYCERIN (NITROSTAT) 0.4 MG SL tablet Place 0.4 mg under the tongue every 5 (five) minutes as needed for chest pain.    [provider]  omeprazole (PRILOSEC) 20 MG capsule Take 20 mg by mouth daily.    [provider]  traMADol (ULTRAM) 50 MG tablet Take 1 tablet (50 mg total) by mouth every 6 (six) hours as needed for up to 5 days (pain not controlled with tylenol). 05/12/20 05/17/20  Ileana Roup, MD    Allergies    Atenolol  Review of Systems   Review of Systems  All other systems reviewed and are negative.   Physical Exam Updated Vital Signs BP (!) 160/69 (BP  Location: Right Arm)   Pulse 77   Temp 98.8 F (37.1 C) (Oral)   Resp 18   SpO2 98%   Physical Exam Vitals and nursing note reviewed.  Constitutional:      Appearance: He is well-developed and well-nourished. He is not toxic-appearing or diaphoretic.  HENT:     Head: Normocephalic and atraumatic.     Right Ear: External ear normal.     Left Ear: External ear normal.  Eyes:     General: No scleral icterus.       Right eye: No discharge.        Left eye: No discharge.     Conjunctiva/sclera: Conjunctivae normal.  Neck:     Trachea: No tracheal deviation.  Cardiovascular:     Rate and Rhythm: Normal rate and  regular rhythm.     Pulses: Intact distal pulses.  Pulmonary:     Effort: Pulmonary effort is normal. No respiratory distress.     Breath sounds: Normal breath sounds. No stridor. No wheezing or rales.  Abdominal:     General: Bowel sounds are normal. There is no distension.     Palpations: Abdomen is soft.     Tenderness: There is no abdominal tenderness. There is no guarding or rebound.     Hernia: No hernia is present.  Musculoskeletal:        General: No tenderness or edema.     Cervical back: Neck supple.  Skin:    General: Skin is warm and dry.     Findings: No rash.     Comments: Skin is warm to the touch  Neurological:     Mental Status: He is alert.     Cranial Nerves: No cranial nerve deficit (no facial droop, extraocular movements intact, no slurred speech).     Sensory: No sensory deficit.     Motor: No abnormal muscle tone or seizure activity.     Coordination: Coordination normal.     Deep Tendon Reflexes: Strength normal.  Psychiatric:        Mood and Affect: Mood and affect normal.     ED Results / Procedures / Treatments   Labs (all labs ordered are listed, but only abnormal results are displayed) Labs Reviewed  RESP PANEL BY RT-PCR (FLU A&B, COVID) ARPGX2 - Abnormal; Notable for the following components:      Result Value   SARS Coronavirus  2 by RT PCR POSITIVE (*)    All other components within normal limits  COMPREHENSIVE METABOLIC PANEL - Abnormal; Notable for the following components:   Potassium 3.2 (*)    Glucose, Bld 107 (*)    Calcium 8.0 (*)    Total Protein 6.0 (*)    Albumin 2.7 (*)    AST 42 (*)    ALT 98 (*)    Alkaline Phosphatase 196 (*)    GFR, Estimated 58 (*)    All other components within normal limits  CBC WITH DIFFERENTIAL/PLATELET - Abnormal; Notable for the following components:   WBC 15.7 (*)    RBC 3.32 (*)    Hemoglobin 10.0 (*)    HCT 31.1 (*)    Neutro Abs 13.9 (*)    Lymphs Abs 0.4 (*)    Monocytes Absolute 1.2 (*)    Abs Immature Granulocytes 0.09 (*)    All other components within normal limits  COMPREHENSIVE METABOLIC PANEL - Abnormal; Notable for the following components:   Potassium 3.1 (*)    Chloride 112 (*)    Calcium 7.3 (*)    Total Protein 5.2 (*)    Albumin 2.1 (*)    ALT 74 (*)    Alkaline Phosphatase 163 (*)    All other components within normal limits  CBC - Abnormal; Notable for the following components:   WBC 12.1 (*)    RBC 2.84 (*)    Hemoglobin 8.5 (*)    HCT 26.4 (*)    All other components within normal limits  MAGNESIUM - Abnormal; Notable for the following components:   Magnesium 1.5 (*)    All other components within normal limits  CULTURE, BLOOD (ROUTINE X 2)  CULTURE, BLOOD (ROUTINE X 2)  URINE CULTURE  LIPASE, BLOOD  URINALYSIS, ROUTINE W REFLEX MICROSCOPIC  LACTIC ACID, PLASMA    EKG None  Radiology  No results found.  Procedures Procedures (including critical care time)  Medications Ordered in ED Medications  sodium chloride 0.9 % bolus 1,000 mL (has no administration in time range)    And  0.9 %  sodium chloride infusion (has no administration in time range)    ED Course  I have reviewed the triage vital signs and the nursing notes.  Pertinent labs & imaging results that were available during my care of the patient were  reviewed by me and considered in my medical decision making (see chart for details).  Clinical Course as of 05/14/20 0847  Ludwig Clarks May 13, 2020  2204 White blood cells elevated compared to previous [JK]  2232 Hypocalcemia and elevated LFTs similar to previous [JK]  2233 Plain films without acute process. [JK]    Clinical Course User Index [JK] Dorie Rank, MD   MDM Rules/Calculators/A&P                          Pt presented with low grade fever, abd pain, vomiting diarrhea after recent cholecystectomy.  Presentation concerning for possible post surgical complications, obstruction, anastamotic leak, intra-abdominal abscess.  UTI also a concern.  CT scan ordered for further evaluation.  Case turned over to Dr Rolland Porter. Final Clinical Impression(s) / ED Diagnoses pending   Dorie Rank, MD 05/14/20 [phone]

## 2020-05-14 ENCOUNTER — Encounter (HOSPITAL_COMMUNITY): Payer: Self-pay | Admitting: Family Medicine

## 2020-05-14 DIAGNOSIS — K5732 Diverticulitis of large intestine without perforation or abscess without bleeding: Secondary | ICD-10-CM | POA: Diagnosis present

## 2020-05-14 DIAGNOSIS — E876 Hypokalemia: Secondary | ICD-10-CM

## 2020-05-14 DIAGNOSIS — Z9049 Acquired absence of other specified parts of digestive tract: Secondary | ICD-10-CM | POA: Insufficient documentation

## 2020-05-14 DIAGNOSIS — M353 Polymyalgia rheumatica: Secondary | ICD-10-CM | POA: Diagnosis present

## 2020-05-14 DIAGNOSIS — I5042 Chronic combined systolic (congestive) and diastolic (congestive) heart failure: Secondary | ICD-10-CM

## 2020-05-14 DIAGNOSIS — I493 Ventricular premature depolarization: Secondary | ICD-10-CM | POA: Diagnosis present

## 2020-05-14 DIAGNOSIS — U071 COVID-19: Secondary | ICD-10-CM | POA: Diagnosis present

## 2020-05-14 DIAGNOSIS — Z951 Presence of aortocoronary bypass graft: Secondary | ICD-10-CM | POA: Diagnosis not present

## 2020-05-14 DIAGNOSIS — E039 Hypothyroidism, unspecified: Secondary | ICD-10-CM

## 2020-05-14 DIAGNOSIS — I2510 Atherosclerotic heart disease of native coronary artery without angina pectoris: Secondary | ICD-10-CM | POA: Diagnosis present

## 2020-05-14 DIAGNOSIS — E785 Hyperlipidemia, unspecified: Secondary | ICD-10-CM | POA: Diagnosis present

## 2020-05-14 DIAGNOSIS — K5792 Diverticulitis of intestine, part unspecified, without perforation or abscess without bleeding: Secondary | ICD-10-CM | POA: Diagnosis not present

## 2020-05-14 DIAGNOSIS — Z85820 Personal history of malignant melanoma of skin: Secondary | ICD-10-CM | POA: Diagnosis not present

## 2020-05-14 DIAGNOSIS — R112 Nausea with vomiting, unspecified: Secondary | ICD-10-CM | POA: Diagnosis present

## 2020-05-14 DIAGNOSIS — Z9841 Cataract extraction status, right eye: Secondary | ICD-10-CM | POA: Diagnosis not present

## 2020-05-14 DIAGNOSIS — Z9842 Cataract extraction status, left eye: Secondary | ICD-10-CM | POA: Diagnosis not present

## 2020-05-14 DIAGNOSIS — D509 Iron deficiency anemia, unspecified: Secondary | ICD-10-CM | POA: Diagnosis present

## 2020-05-14 DIAGNOSIS — Z888 Allergy status to other drugs, medicaments and biological substances status: Secondary | ICD-10-CM | POA: Diagnosis not present

## 2020-05-14 DIAGNOSIS — M19049 Primary osteoarthritis, unspecified hand: Secondary | ICD-10-CM | POA: Diagnosis present

## 2020-05-14 DIAGNOSIS — R7989 Other specified abnormal findings of blood chemistry: Secondary | ICD-10-CM | POA: Diagnosis present

## 2020-05-14 DIAGNOSIS — I2583 Coronary atherosclerosis due to lipid rich plaque: Secondary | ICD-10-CM

## 2020-05-14 DIAGNOSIS — G4730 Sleep apnea, unspecified: Secondary | ICD-10-CM | POA: Diagnosis present

## 2020-05-14 DIAGNOSIS — N1831 Chronic kidney disease, stage 3a: Secondary | ICD-10-CM | POA: Diagnosis present

## 2020-05-14 DIAGNOSIS — I447 Left bundle-branch block, unspecified: Secondary | ICD-10-CM | POA: Diagnosis present

## 2020-05-14 DIAGNOSIS — Z961 Presence of intraocular lens: Secondary | ICD-10-CM | POA: Diagnosis present

## 2020-05-14 DIAGNOSIS — I429 Cardiomyopathy, unspecified: Secondary | ICD-10-CM | POA: Diagnosis present

## 2020-05-14 LAB — URINALYSIS, ROUTINE W REFLEX MICROSCOPIC
Bilirubin Urine: NEGATIVE
Glucose, UA: NEGATIVE mg/dL
Hgb urine dipstick: NEGATIVE
Ketones, ur: NEGATIVE mg/dL
Leukocytes,Ua: NEGATIVE
Nitrite: NEGATIVE
Protein, ur: NEGATIVE mg/dL
Specific Gravity, Urine: 1.028 (ref 1.005–1.030)
pH: 5 (ref 5.0–8.0)

## 2020-05-14 LAB — COMPREHENSIVE METABOLIC PANEL
ALT: 74 U/L — ABNORMAL HIGH (ref 0–44)
AST: 30 U/L (ref 15–41)
Albumin: 2.1 g/dL — ABNORMAL LOW (ref 3.5–5.0)
Alkaline Phosphatase: 163 U/L — ABNORMAL HIGH (ref 38–126)
Anion gap: 8 (ref 5–15)
BUN: 18 mg/dL (ref 8–23)
CO2: 22 mmol/L (ref 22–32)
Calcium: 7.3 mg/dL — ABNORMAL LOW (ref 8.9–10.3)
Chloride: 112 mmol/L — ABNORMAL HIGH (ref 98–111)
Creatinine, Ser: 1.1 mg/dL (ref 0.61–1.24)
GFR, Estimated: >60 mL/min (ref >60)
Glucose, Bld: 79 mg/dL (ref 70–99)
Potassium: 3.1 mmol/L — ABNORMAL LOW (ref 3.5–5.1)
Sodium: 142 mmol/L (ref 135–145)
Total Bilirubin: 0.6 mg/dL (ref 0.3–1.2)
Total Protein: 5.2 g/dL — ABNORMAL LOW (ref 6.5–8.1)

## 2020-05-14 LAB — CBC
HCT: 26.4 % — ABNORMAL LOW (ref 39.0–52.0)
Hemoglobin: 8.5 g/dL — ABNORMAL LOW (ref 13.0–17.0)
MCH: 29.9 pg (ref 26.0–34.0)
MCHC: 32.2 g/dL (ref 30.0–36.0)
MCV: 93 fL (ref 80.0–100.0)
Platelets: 312 10*3/uL (ref 150–400)
RBC: 2.84 MIL/uL — ABNORMAL LOW (ref 4.22–5.81)
RDW: 13.7 % (ref 11.5–15.5)
WBC: 12.1 10*3/uL — ABNORMAL HIGH (ref 4.0–10.5)
nRBC: 0 % (ref 0.0–0.2)

## 2020-05-14 LAB — RESP PANEL BY RT-PCR (FLU A&B, COVID) ARPGX2
Influenza A by PCR: NEGATIVE
Influenza B by PCR: NEGATIVE
SARS Coronavirus 2 by RT PCR: POSITIVE — AB

## 2020-05-14 LAB — MAGNESIUM: Magnesium: 1.5 mg/dL — ABNORMAL LOW (ref 1.7–2.4)

## 2020-05-14 MED ORDER — PIPERACILLIN-TAZOBACTAM 3.375 G IVPB
3.3750 g | Freq: Three times a day (TID) | INTRAVENOUS | Status: DC
  Administered 2020-05-14 – 2020-05-16 (×8): 3.375 g via INTRAVENOUS
  Filled 2020-05-14 (×8): qty 50

## 2020-05-14 MED ORDER — MIRTAZAPINE 15 MG PO TABS
15.0000 mg | ORAL_TABLET | Freq: Every day | ORAL | Status: DC
Start: 2020-05-14 — End: 2020-05-17
  Administered 2020-05-14 – 2020-05-15 (×2): 15 mg via ORAL
  Filled 2020-05-14 (×2): qty 1

## 2020-05-14 MED ORDER — FENTANYL CITRATE (PF) 100 MCG/2ML IJ SOLN: 12.5000 ug | INTRAMUSCULAR | Status: DC | PRN

## 2020-05-14 MED ORDER — POTASSIUM CHLORIDE 10 MEQ/100ML IV SOLN
10.0000 meq | INTRAVENOUS | Status: AC
  Administered 2020-05-14 (×4): 10 meq via INTRAVENOUS
  Filled 2020-05-14 (×4): qty 100

## 2020-05-14 MED ORDER — SODIUM CHLORIDE 0.9 % IV SOLN
200.0000 mg | Freq: Once | INTRAVENOUS | Status: AC
  Administered 2020-05-14: 200 mg via INTRAVENOUS
  Filled 2020-05-14: qty 200

## 2020-05-14 MED ORDER — SODIUM CHLORIDE 0.9 % IV SOLN
3.0000 g | Freq: Once | INTRAVENOUS | Status: AC
  Administered 2020-05-14: 3 g via INTRAVENOUS
  Filled 2020-05-14: qty 8

## 2020-05-14 MED ORDER — ENOXAPARIN SODIUM 40 MG/0.4ML ~~LOC~~ SOLN
40.0000 mg | SUBCUTANEOUS | Status: DC
  Administered 2020-05-14 – 2020-05-16 (×3): 40 mg via SUBCUTANEOUS
  Filled 2020-05-14 (×3): qty 0.4

## 2020-05-14 MED ORDER — ASPIRIN 81 MG PO CHEW
81.0000 mg | CHEWABLE_TABLET | Freq: Every day | ORAL | Status: DC
  Administered 2020-05-14 – 2020-05-16 (×3): 81 mg via ORAL
  Filled 2020-05-14 (×3): qty 1

## 2020-05-14 MED ORDER — ONDANSETRON HCL 4 MG PO TABS: 4.0000 mg | ORAL_TABLET | Freq: Four times a day (QID) | ORAL | Status: DC | PRN

## 2020-05-14 MED ORDER — ACETAMINOPHEN 325 MG PO TABS: 650.0000 mg | ORAL_TABLET | Freq: Four times a day (QID) | ORAL | Status: DC | PRN

## 2020-05-14 MED ORDER — POTASSIUM CHLORIDE IN NACL 20-0.9 MEQ/L-% IV SOLN
INTRAVENOUS | Status: AC
  Filled 2020-05-14: qty 1000

## 2020-05-14 MED ORDER — MAGNESIUM SULFATE 2 GM/50ML IV SOLN
2.0000 g | Freq: Once | INTRAVENOUS | Status: AC
  Administered 2020-05-14: 2 g via INTRAVENOUS
  Filled 2020-05-14: qty 50

## 2020-05-14 MED ORDER — ONDANSETRON HCL 4 MG/2ML IJ SOLN: 4.0000 mg | Freq: Four times a day (QID) | INTRAMUSCULAR | Status: DC | PRN

## 2020-05-14 MED ORDER — SODIUM CHLORIDE 0.9 % IV SOLN
100.0000 mg | Freq: Every day | INTRAVENOUS | Status: DC
  Administered 2020-05-15 – 2020-05-16 (×2): 100 mg via INTRAVENOUS
  Filled 2020-05-14 (×2): qty 20

## 2020-05-14 MED ORDER — SODIUM CHLORIDE 0.9 % IV BOLUS
1000.0000 mL | Freq: Once | INTRAVENOUS | Status: AC
  Administered 2020-05-14: 1000 mL via INTRAVENOUS

## 2020-05-14 MED ORDER — PANTOPRAZOLE SODIUM 40 MG PO TBEC
40.0000 mg | DELAYED_RELEASE_TABLET | Freq: Every day | ORAL | Status: DC
  Administered 2020-05-14 – 2020-05-16 (×3): 40 mg via ORAL
  Filled 2020-05-14 (×3): qty 1

## 2020-05-14 MED ORDER — ACETAMINOPHEN 650 MG RE SUPP: 650.0000 mg | Freq: Four times a day (QID) | RECTAL | Status: DC | PRN

## 2020-05-14 MED ORDER — LEVOTHYROXINE SODIUM 25 MCG PO TABS
125.0000 ug | ORAL_TABLET | Freq: Every day | ORAL | Status: DC
  Administered 2020-05-14 – 2020-05-16 (×3): 125 ug via ORAL
  Filled 2020-05-14 (×3): qty 1

## 2020-05-14 MED ORDER — ISOSORBIDE MONONITRATE ER 30 MG PO TB24
30.0000 mg | ORAL_TABLET | Freq: Every day | ORAL | Status: DC
  Administered 2020-05-14 – 2020-05-16 (×3): 30 mg via ORAL
  Filled 2020-05-14 (×3): qty 1

## 2020-05-14 NOTE — Consult Note (Signed)
  Gary Gonzalez is well known to our service.  He underwent a laparoscopic cholecystectomy and a cholangiogram on 1/11 followed by an ERCP with stone removal on 1/12.  Discharged home on 1/13.  He reports that he started having lower abdominal pain which was cramping and uncomfortable.  He was having diarrhea and then started having nausea and vomiting.  He thus presented back to the emergency department.  He reports he had no pain in his upper abdomen  He has now been found to be COVID-positive  He was found to have a white blood count of 15.7 which is now down to 12.1.  His bilirubin and alkaline phosphatase are normal and his AST and ALT have continued to decrease  He underwent a CT scan of the abdomen and pelvis which shows postoperative changes with minimal fluid in the gallbladder fossa.  There were findings possibly of early diverticulitis  On exam, he is now awake and alert.  His abdomen is soft and currently nontender.  Impression:  He is status post lap chole and ERCP.  He is now COVID-positive.  He also may have mild diverticulitis. Clinically, his abdominal exam is fairly benign and this does not seem to be consistent with a bile leak. He is being admitted to the hospital by the medical service and his work-up is continuing.  We will follow as well.  I am going to go ahead and let him have clear liquids

## 2020-05-14 NOTE — H&P (Signed)
History and Physical    JEKHI BOLIN ZOX:[id number] DOB: September 29, 1926 DOA: 05/13/2020  PCP: [name], MD   Patient coming from: Home   Chief Complaint: N/V/D, weakness, lower abdominal cramps   HPI: Gary Gonzalez is a 87 y.o. male with medical history significant for PVC induced cardiomyopathy, chronic kidney disease stage III, hypothyroidism, and recent cholecystitis with choledocholithiasis and biliary pancreatitis status post laparoscopic cholecystectomy on 05/10/2020 and ERCP with sphincterotomy the following day, now returning to the emergency department for evaluation of nausea, vomiting, diarrhea, and lower abdominal pain.  Patient reports that he had been doing well back at home after the recent hospitalization until yesterday when he developed acute onset of cramping discomfort in the lower abdomen, loose stools, nausea, and couple episodes of vomiting.  He felt generally weak and fatigued.  Did not notice any fevers or chills.  Denies any pain in his upper abdomen.  ED Course: Upon arrival to the ED, patient is found to be afebrile, saturating mid 90s on room air, and with stable blood pressure.  Chemistry panel is notable for improving LFTs, potassium 3.2, and stable renal insufficiency.  CBC notable for an increased leukocytosis, now 15,700, and stable normocytic anemia.  Lactic acid is normal.  Blood cultures were collected in the emergency department, IV fluids and antibiotics were administered, surgery was consulted by the ED physician, and medical admission was recommended.  Review of Systems:  All other systems reviewed and apart from HPI, are negative.  Past Medical History:  Diagnosis Date  . Anemia   . Arthritis    "touch in the fingers" (11/17/2014)  . Chronic combined systolic and diastolic CHF (congestive heart failure) ([place])    a. 11/17/14 showed EF 25-30%, suboptimal image quality, diffuse hypokinesis worse in the inferior wall, mild to moderate MR. New drop EF compared to  2012 but cath was stable.  . CKD (chronic kidney disease), stage III ([place])   . Coronary artery disease    a. s/p 2v CABG (seq LIMA to Diag and LAD, SVG to OM) on 05/15/2001.  . Fibromyalgia    "a long time ago" (11/17/2014)  . History of gout   . Hyperlipidemia   . Hypothyroidism   . LBBB (left bundle branch block)   . Melanoma of back ([place])   . NSVT (nonsustained ventricular tachycardia) ([place])   . PMR (polymyalgia rheumatica) ([facility])   . PVC's (premature ventricular contractions)   . Sleep apnea    "wife says I do" (11/18/2014)    Past Surgical History:  Procedure Laterality Date  . AXILLARY LYMPH NODE DISSECTION Bilateral 2001  . CARDIAC CATHETERIZATION  04/28/2001  . CARDIAC CATHETERIZATION N/A 11/19/2014   Procedure: Right/Left Heart Cath and Coronary/Graft Angiography;  Surgeon: Jettie Booze, MD;  Location: [facility];  Service: Cardiovascular;  Laterality: N/A;  . CATARACT EXTRACTION W/ INTRAOCULAR LENS  IMPLANT, BILATERAL Bilateral ~ 2010  . CHOLECYSTECTOMY N/A 05/10/2020   Procedure: LAPAROSCOPIC CHOLECYSTECTOMY WITH INTRAOPERATIVE CHOLANGIOGRAM;  Surgeon: Coralie Keens, MD;  Location: [facility];  Service: General;  Laterality: N/A;  . CORONARY ARTERY BYPASS GRAFT  05/15/2001   x3 sequential LIMA to diag and LAD, SVG to OM   . ERCP N/A 05/11/2020   Procedure: ENDOSCOPIC RETROGRADE CHOLANGIOPANCREATOGRAPHY (ERCP);  Surgeon: Clarene Essex, MD;  Location: [facility];  Service: Endoscopy;  Laterality: N/A;  . MELANOMA EXCISION  2001   off of back  . REMOVAL OF STONES  05/11/2020   Procedure: REMOVAL OF STONES;  Surgeon: Clarene Essex, MD;  Location: [facility];  Service: Endoscopy;;  . Joan Mayans  05/11/2020   Procedure: Joan Mayans;  Surgeon: Clarene Essex, MD;  Location: [facility];  Service: Endoscopy;;    Social History:   reports that he has never smoked. He has never used smokeless tobacco. He reports current alcohol use of about 6.0 standard drinks of  alcohol per week. He reports that he does not use drugs.  Allergies  Allergen Reactions  . Atenolol Diarrhea and Other (See Comments)    Very weak, fatigued, lethargic, no appetite    Family History  Problem Relation Age of Onset  . Prostate cancer Father   . Liver cancer Mother   . Hyperlipidemia Son   . Hyperlipidemia Daughter      Prior to Admission medications   Medication Sig Start Date End Date Taking? Authorizing Provider  aspirin 81 MG tablet Take 81 mg by mouth daily.   Yes [provider]  ezetimibe (ZETIA) 10 MG tablet Take 10 mg by mouth daily.   Yes [provider]  isosorbide mononitrate (IMDUR) 30 MG 24 hr tablet Take 1 tablet (30 mg total) by mouth daily. 07/30/19 10/28/19 Yes Tillery, Satira Mccallum, PA-C  isosorbide mononitrate (IMDUR) 30 MG 24 hr tablet Take 30 mg by mouth daily.   Yes [provider]  levothyroxine (SYNTHROID, LEVOTHROID) 125 MCG tablet Take 1 tablet by mouth daily before breakfast. 04/13/16  Yes [provider]  mirtazapine (REMERON) 15 MG tablet Take 15 mg by mouth at bedtime.   Yes [provider]  nitroGLYCERIN (NITROSTAT) 0.4 MG SL tablet Place 1 tablet (0.4 mg total) under the tongue every 5 (five) minutes as needed for chest pain. 07/30/19 10/28/19 Yes Tillery, Satira Mccallum, PA-C  omeprazole (PRILOSEC) 20 MG capsule Take 20 mg by mouth daily.   Yes [provider]  traMADol (ULTRAM) 50 MG tablet Take 1 tablet (50 mg total) by mouth every 6 (six) hours as needed for up to 5 days (pain not controlled with tylenol). 05/12/20 05/17/20 Yes Ileana Roup, MD  Evolocumab (REPATHA SURECLICK) [id number] MG/ML SOAJ Inject 140 mg into the skin every 28 (twenty-eight) days. Patient not taking: Reported on 05/13/2020    [provider]  nitroGLYCERIN (NITROSTAT) 0.4 MG SL tablet Place 0.4 mg under the tongue every 5 (five) minutes as needed for chest pain.    [provider]    Physical  Exam: Vitals:   05/14/20 0030 05/14/20 0100 05/14/20 0130 05/14/20 0200  BP: (!) 141/61 (!) 143/66 (!) 150/64 (!) 152/71  Pulse: 77 75 79 85  Resp:      Temp:      TempSrc:      SpO2: 94% 93% 91%     Constitutional: NAD, calm  Eyes: PERTLA, lids and conjunctivae normal ENMT: Mucous membranes are moist. Posterior pharynx clear of any exudate or lesions.   Neck: normal, supple, no masses, no thyromegaly Respiratory: no wheezing, no crackles. No accessory muscle use.  Cardiovascular: S1 & S2 heard, regular rate and rhythm. No extremity edema.  Abdomen: No distension, soft, tender without rebound pain or guarding. Bowel sounds active.  Musculoskeletal: no clubbing / cyanosis. No joint deformity upper and lower extremities.   Skin: no significant rashes, lesions, ulcers. Warm, dry, well-perfused. Neurologic: CN 2-12 grossly intact. Sensation intact. Moving all extremities.  Psychiatric: Alert and oriented to person, place, and situation. Very pleasant and cooperative.    Labs and Imaging on Admission: I have personally  reviewed following labs and imaging studies  CBC: Recent Labs  Lab 05/08/20 1539 05/09/20 0809 05/10/20 0220 05/11/20 0346 05/12/20 0353 05/13/20 2044  WBC 10.5 12.7* 11.6* 11.5* 11.1* 15.7*  NEUTROABS 8.8*  --   --   --  9.9* 13.9*  HGB 12.3* 10.8* 9.8* 9.6* 9.0* 10.0*  HCT 37.4* 32.9* 30.4* 30.3* 28.2* 31.1*  MCV 91.4 92.9 93.5 94.7 93.7 93.7  PLT 276 266 260 256 248 [id number]   Basic Metabolic Panel: Recent Labs  Lab 05/10/20 0220 05/11/20 1003 05/11/20 2230 05/12/20 0353 05/13/20 2044  NA 138 140 136 139 141  K 3.7 3.4* 3.6 3.5 3.2*  CL 105 107 106 106 105  CO2 24 24 21* 23 23  GLUCOSE 97 105* 199* 158* 107*  BUN 19 14 19 19 21   CREATININE 1.18 1.13 1.25* 1.20 1.19  CALCIUM 8.3* 8.0* 7.6* 7.8* 8.0*   GFR: Estimated Creatinine Clearance: 37.7 mL/min (by C-G formula based on SCr of 1.19 mg/dL). Liver Function Tests: Recent Labs  Lab 05/10/20 0220  05/11/20 1003 05/11/20 2230 05/12/20 0353 05/13/20 2044  AST 189* 116* 75* 67* 42*  ALT 434* 210* 148* 146* 98*  ALKPHOS 370* 265* 205* 229* 196*  BILITOT 1.2 0.8 0.7 0.6 1.0  PROT 6.4* 6.1* 5.5* 5.8* 6.0*  ALBUMIN 3.0* 2.8* 2.5* 2.6* 2.7*   Recent Labs  Lab 05/08/20 1539 05/09/20 0809 05/10/20 0220 05/13/20 2044  LIPASE >10,000* 946* 217* 45   No results for input(s): AMMONIA in the last 168 hours. Coagulation Profile: Recent Labs  Lab 05/08/20 1539  INR 1.0   Cardiac Enzymes: No results for input(s): CKTOTAL, CKMB, CKMBINDEX, TROPONINI in the last 168 hours. BNP (last 3 results) No results for input(s): PROBNP in the last 8760 hours. HbA1C: No results for input(s): HGBA1C in the last 72 hours. CBG: Recent Labs  Lab 05/09/20 2135  GLUCAP 117*   Lipid Profile: No results for input(s): CHOL, HDL, LDLCALC, TRIG, CHOLHDL, LDLDIRECT in the last 72 hours. Thyroid Function Tests: No results for input(s): TSH, T4TOTAL, FREET4, T3FREE, THYROIDAB in the last 72 hours. Anemia Panel: No results for input(s): VITAMINB12, FOLATE, FERRITIN, TIBC, IRON, RETICCTPCT in the last 72 hours. Urine analysis:    Component Value Date/Time   COLORURINE YELLOW 05/13/2020 2044   APPEARANCEUR CLEAR 05/13/2020 2044   LABSPEC 1.028 05/13/2020 2044   PHURINE 5.0 05/13/2020 2044   GLUCOSEU NEGATIVE 05/13/2020 2044   HGBUR NEGATIVE 05/13/2020 2044   BILIRUBINUR NEGATIVE 05/13/2020 2044   KETONESUR NEGATIVE 05/13/2020 2044   PROTEINUR NEGATIVE 05/13/2020 2044   NITRITE NEGATIVE 05/13/2020 2044   LEUKOCYTESUR NEGATIVE 05/13/2020 2044   Sepsis Labs: @LABRCNTIP (procalcitonin:4,lacticidven:4) ) Recent Results (from the past 240 hour(s))  SARS CORONAVIRUS 2 (TAT 6-24 HRS) Nasopharyngeal Nasopharyngeal Swab     Status: None   Collection Time: 05/08/20  5:27 PM   Specimen: Nasopharyngeal Swab  Result Value Ref Range Status   SARS Coronavirus 2 NEGATIVE NEGATIVE Final    Comment:  (NOTE) SARS-CoV-2 target nucleic acids are NOT DETECTED.  The SARS-CoV-2 RNA is generally detectable in upper and lower respiratory specimens during the acute phase of infection. Negative results do not preclude SARS-CoV-2 infection, do not rule out co-infections with other pathogens, and should not be used as the sole basis for treatment or other patient management decisions. Negative results must be combined with clinical observations, patient history, and epidemiological information. The expected result is Negative.  Fact Sheet for Patients: SugarRoll.be  Fact Sheet for Healthcare  Providers: https://www.woods-mathews.com/  This test is not yet approved or cleared by the [place] and  has been authorized for detection and/or diagnosis of SARS-CoV-2 by FDA under an Emergency Use Authorization (EUA). This EUA will remain  in effect (meaning this test can be used) for the duration of the COVID-19 declaration under Se ction 564(b)(1) of the Act, 21 U.S.C. section 360bbb-3(b)(1), unless the authorization is terminated or revoked sooner.  Performed at [facility], [place] [address], [place], [place] [address]   Surgical pcr screen     Status: None   Collection Time: 05/09/20  4:53 AM   Specimen: Nasal Mucosa; Nasal Swab  Result Value Ref Range Status   MRSA, PCR NEGATIVE NEGATIVE Final   Staphylococcus aureus NEGATIVE NEGATIVE Final    Comment: (NOTE) The Xpert SA Assay (FDA approved for NASAL specimens in patients 27 years of age and older), is one component of a comprehensive surveillance program. It is not intended to diagnose infection nor to guide or monitor treatment. Performed at [facility], [place] [address] [place], [place] [address]      Radiological Exams on Admission: CT ABDOMEN PELVIS W CONTRAST  Result Date: 05/13/2020 CLINICAL DATA:  Abdominal pain EXAM: CT ABDOMEN AND PELVIS WITH  CONTRAST TECHNIQUE: Multidetector CT imaging of the abdomen and pelvis was performed using the standard protocol following bolus administration of intravenous contrast. CONTRAST:  144mL OMNIPAQUE IOHEXOL 300 MG/ML  SOLN COMPARISON:  None. FINDINGS: LOWER CHEST: Bibasilar atelectasis and small pleural effusions. HEPATOBILIARY: Small amount of perihepatic fluid. Mild pneumobilia. Fluid collection at the gallbladder fossa status post recent cholecystectomy. PANCREAS: Normal pancreas. No ductal dilatation or peripancreatic fluid collection. SPLEEN: Normal. ADRENALS/URINARY TRACT: The adrenal glands are normal. No hydronephrosis, nephroureterolithiasis or solid renal mass. The urinary bladder is normal for degree of distention STOMACH/BOWEL: There is no hiatal hernia. Normal duodenal course and caliber. No small bowel dilatation or inflammation. There is rectosigmoid diverticulosis with mild wall thickening. Normal appendix. VASCULAR/LYMPHATIC: There is calcific atherosclerosis of the abdominal aorta. No lymphadenopathy. REPRODUCTIVE: Normal prostate size with symmetric seminal vesicles. MUSCULOSKELETAL. Multilevel degenerative disc disease and facet arthrosis. No bony spinal canal stenosis. OTHER: None. IMPRESSION: 1. Status post recent cholecystectomy and sphincterotomy with fluid collection at the gallbladder fossa, and small volume pneumobilia. No specific features of infection. 2. Rectosigmoid diverticulosis with mild wall thickening, which may indicate early acute diverticulitis. 3. Small pleural effusions and bibasilar atelectasis. Aortic Atherosclerosis (ICD10-I70.0). Electronically Signed   By: Ulyses Jarred M.D.   On: 05/13/2020 23:31   DG Abdomen Acute W/Chest  Result Date: 05/13/2020 CLINICAL DATA:  Abdominal pain EXAM: DG ABDOMEN ACUTE WITH 1 VIEW CHEST COMPARISON:  None. FINDINGS: There is no evidence of dilated bowel loops or free intraperitoneal air. No radiopaque calculi or other significant  radiographic abnormality is seen. Heart size and mediastinal contours are within normal limits. Both lungs are clear. IMPRESSION: Negative abdominal radiographs.  No acute cardiopulmonary disease. Electronically Signed   By: Ulyses Jarred M.D.   On: 05/13/2020 21:55    Assessment/Plan   1. Acute diverticulitis  - Presents with 1 day of N/V/D and crampy lower abdominal pain after recent lap chole and ERCP and is found to have increased leukocytosis and CT-findings suggestive of early diverticulitis  - Blood cultures were collected in ED, IVF bolus given, and empiric antibiotics started  - Continue bowel-rest, IVF hydration, Zosyn    2. Cholecystitis and choledocholithiasis s/p lap chole and ERCP  -  Status-post lap chole 1/11 and ERCP with sphincterotomy 1/12  - LFTs continue to improve, fluid-collection noted at gallbladder fossa on CT but no specific infectious features  - Surgery consulted by ED physician and much appreciated    3. CAD  - No anginal complaints, continue ASA and Imdur as tolerated    4. CKD IIIa  - SCr is 1.19 on admission, close to baseline   - Renally-dose medications, monitor    5. Anemia  - Hgb is 10.0 on admission, appears stable   6. Hypokalemia  - KCl added to IVF, repeat chem panel in am    7. PVC-induced cardiomyopathy   - Follows with cardiology, EF had normalized on last echo, no longer on amiodarone    8. Hypothyroidism  - Continue Synthroid    DVT prophylaxis: Lovenox  Code Status: Full  Family Communication: Son updated at bedside Disposition Plan:  Patient is from: Home  Anticipated d/c is to: TBD Anticipated d/c date is: 05/17/20 Patient currently: Pending improvement with abx, surgical eval  Consults called: Surgery consulted by EDP  Admission status: Inpatient     Vianne Bulls, MD [facility]  05/14/2020, 2:32 AM

## 2020-05-14 NOTE — Progress Notes (Signed)
Pharmacy Antibiotic Note  Gary Gonzalez is a 87 y.o. male admitted on 05/13/2020 with acute diverticulitis. Pharmacy has been consulted for Zosyn dosing.  Plan: Zosyn 3.375g IV q8h (each dose infused over 4 hours) Monitor renal function, cultures, clinical course     Temp (24hrs), Avg:98.8 F (37.1 C), Min:98.8 F (37.1 C), Max:98.8 F (37.1 C)  Recent Labs  Lab 05/09/20 0809 05/10/20 0220 05/11/20 0346 05/11/20 1003 05/11/20 2230 05/12/20 0353 05/13/20 2044 05/13/20 2045  WBC 12.7* 11.6* 11.5*  --   --  11.1* 15.7*  --   CREATININE 1.18 1.18  --  1.13 1.25* 1.20 1.19  --   LATICACIDVEN  --   --   --   --   --   --   --  1.3    Estimated Creatinine Clearance: 37.7 mL/min (by C-G formula based on SCr of 1.19 mg/dL).    Allergies  Allergen Reactions  . Atenolol Diarrhea and Other (See Comments)    Very weak, fatigued, lethargic, no appetite     Thank you for allowing pharmacy to be a part of this patient's care.   Luiz Ochoa 05/14/2020 3:23 AM

## 2020-05-14 NOTE — ED Notes (Signed)
Spoke with pt's daughter Juliann Pulse. Provided brief update. Informed provider she would like update when available.

## 2020-05-14 NOTE — ED Notes (Signed)
With Tillie Rung, [place], this nurse changed bed linens and provided pericare.

## 2020-05-14 NOTE — Progress Notes (Addendum)
PROGRESS NOTE    Gary Gonzalez  [id number] DOB: 05/12/1926 DOA: 05/13/2020 PCP: [name], MD   Chief Complaint  Patient presents with  . Abdominal Pain  . Emesis  . Weakness  . Diarrhea    Brief Narrative:  Gary Gonzalez is Gary Gonzalez 87 y.o. male with medical history significant for PVC induced cardiomyopathy, chronic kidney disease stage III, hypothyroidism, and recent cholecystitis with choledocholithiasis and biliary pancreatitis status post laparoscopic cholecystectomy on 05/10/2020 and ERCP with sphincterotomy the following day, now returning to the emergency department for evaluation of nausea, vomiting, diarrhea, and lower abdominal pain.  Patient reports that he had been doing well back at home after the recent hospitalization until yesterday when he developed acute onset of cramping discomfort in the lower abdomen, loose stools, nausea, and couple episodes of vomiting.  He felt generally weak and fatigued.  Did not notice any fevers or chills.  Denies any pain in his upper abdomen.  ED Course: Upon arrival to the ED, patient is found to be afebrile, saturating mid 90s on room air, and with stable blood pressure.  Chemistry panel is notable for improving LFTs, potassium 3.2, and stable renal insufficiency.  CBC notable for an increased leukocytosis, now 15,700, and stable normocytic anemia.  Lactic acid is normal.  Blood cultures were collected in the emergency department, IV fluids and antibiotics were administered, surgery was consulted by the ED physician, and medical admission was recommended.   Assessment & Plan:   Principal Problem:   Acute diverticulitis Active Problems:   ANEMIA, IRON DEFICIENCY   CAD (coronary artery disease)   CKD (chronic kidney disease), stage III (HCC)   Chronic combined systolic and diastolic CHF (congestive heart failure) (HCC)   Hypothyroidism (acquired)   Hypokalemia  1. Acute diverticulitis  - Presents with 1 day of N/V/D and crampy lower  abdominal pain after recent lap chole and ERCP and is found to have increased leukocytosis and CT-findings suggestive of early diverticulitis  - Blood and urine cultures pending - VF hydration, Zosyn    2. Cholecystitis and choledocholithiasis s/p lap chole and ERCP  - Status-post lap chole 1/11 and ERCP with sphincterotomy 1/12  - LFTs continue to improve, fluid-collection noted at gallbladder fossa on CT but no specific infectious features  - Surgery consulted by ED physician and much appreciated  -> not c/w bile leak per surgery, appreciate assistance  # COVID positive Asymptomatic, vaccinated with booster (pfizer) Will discuss remdesivir given risk factors   3. CAD  - No anginal complaints, continue ASA and Imdur as tolerated    4. CKD IIIa  - continue to monitor  5. Anemia  - fluctuating, continue to monitor  6. Hypokalemia  Hypomagnesemia - replace and follow   7. PVC-induced cardiomyopathy   - Follows with cardiology, EF not well assessed on 2018 echo, no longer on amiodarone    8. Hypothyroidism  - Continue Synthroid   DVT prophylaxis: lovenox Code Status: full  Family Communication: none at bedside - wife Disposition:   Status is: Inpatient  Remains inpatient appropriate because:Inpatient level of care appropriate due to severity of illness   Dispo: The patient is from: Home              Anticipated d/c is to: Home              Anticipated d/c date is: > 3 days              Patient currently is  not medically stable to d/c.       Consultants:   surgery  Procedures:  none  Antimicrobials:  Anti-infectives (From admission, onward)   Start     Dose/Rate Route Frequency Ordered Stop   05/14/20 0500  piperacillin-tazobactam (ZOSYN) IVPB 3.375 g        3.375 g 12.5 mL/hr over 240 Minutes Intravenous Every 8 hours 05/14/20 0323     05/14/20 0045  Ampicillin-Sulbactam (UNASYN) 3 g in sodium chloride 0.9 % 100 mL IVPB        3 g 200 mL/hr over  30 Minutes Intravenous  Once 05/14/20 0041 05/14/20 0208         Subjective: Notes he's feeling better  Objective: Vitals:   05/14/20 0530 05/14/20 0832 05/14/20 1130 05/14/20 1245  BP: (!) 140/59 (!) 126/54 (!) 133/55 124/64  Pulse: 69 78 77 72  Resp: 17 (!) 24 (!) 26 16  Temp:      TempSrc:      SpO2: 93% 95% 90% 92%    Intake/Output Summary (Last 24 hours) at 05/14/2020 1347 Last data filed at 05/14/2020 0253 Gross per 24 hour  Intake 1100 ml  Output --  Net 1100 ml   There were no vitals filed for this visit.  Examination:  General exam: Appears calm and comfortable  Respiratory system: Clear to auscultation. Respiratory effort normal. Cardiovascular system: S1 & S2 heard, RRR Gastrointestinal system: soft, nontender, surgical incisions well appearing Central nervous system: Alert and oriented. No focal neurological deficits. Extremities: no LEE Skin: No rashes, lesions or ulcers Psychiatry: Judgement and insight appear normal. Mood & affect appropriate.     Data Reviewed: I have personally reviewed following labs and imaging studies  CBC: Recent Labs  Lab 05/08/20 1539 05/09/20 0809 05/10/20 0220 05/11/20 0346 05/12/20 0353 05/13/20 2044 05/14/20 0652  WBC 10.5   < > 11.6* 11.5* 11.1* 15.7* 12.1*  NEUTROABS 8.8*  --   --   --  9.9* 13.9*  --   HGB 12.3*   < > 9.8* 9.6* 9.0* 10.0* 8.5*  HCT 37.4*   < > 30.4* 30.3* 28.2* 31.1* 26.4*  MCV 91.4   < > 93.5 94.7 93.7 93.7 93.0  PLT 276   < > 260 256 248 350 312   < > = values in this interval not displayed.    Basic Metabolic Panel: Recent Labs  Lab 05/11/20 1003 05/11/20 2230 05/12/20 0353 05/13/20 2044 05/14/20 0652  NA 140 136 139 141 142  K 3.4* 3.6 3.5 3.2* 3.1*  CL 107 106 106 105 112*  CO2 24 21* 23 23 22   GLUCOSE 105* 199* 158* 107* 79  BUN 14 19 19 21 18   CREATININE 1.13 1.25* 1.20 1.19 1.10  CALCIUM 8.0* 7.6* 7.8* 8.0* 7.3*  MG  --   --   --   --  1.5*    GFR: Estimated  Creatinine Clearance: 40.8 mL/min (by C-G formula based on SCr of 1.1 mg/dL).  Liver Function Tests: Recent Labs  Lab 05/11/20 1003 05/11/20 2230 05/12/20 0353 05/13/20 2044 05/14/20 0652  AST 116* 75* 67* 42* 30  ALT 210* 148* 146* 98* 74*  ALKPHOS 265* 205* 229* 196* 163*  BILITOT 0.8 0.7 0.6 1.0 0.6  PROT 6.1* 5.5* 5.8* 6.0* 5.2*  ALBUMIN 2.8* 2.5* 2.6* 2.7* 2.1*    CBG: Recent Labs  Lab 05/09/20 2135  GLUCAP 117*     Recent Results (from the past 240 hour(s))  SARS CORONAVIRUS  2 (TAT 6-24 HRS) Nasopharyngeal Nasopharyngeal Swab     Status: None   Collection Time: 05/08/20  5:27 PM   Specimen: Nasopharyngeal Swab  Result Value Ref Range Status   SARS Coronavirus 2 NEGATIVE NEGATIVE Final    Comment: (NOTE) SARS-CoV-2 target nucleic acids are NOT DETECTED.  The SARS-CoV-2 RNA is generally detectable in upper and lower respiratory specimens during the acute phase of infection. Negative results do not preclude SARS-CoV-2 infection, do not rule out co-infections with other pathogens, and should not be used as the sole basis for treatment or other patient management decisions. Negative results must be combined with clinical observations, patient history, and epidemiological information. The expected result is Negative.  Fact Sheet for Patients: SugarRoll.be  Fact Sheet for Healthcare Providers: https://www.woods-mathews.com/  This test is not yet approved or cleared by the [place] FDA and  has been authorized for detection and/or diagnosis of SARS-CoV-2 by FDA under an Emergency Use Authorization (EUA). This EUA will remain  in effect (meaning this test can be used) for the duration of the COVID-19 declaration under Se ction 564(b)(1) of the Act, 21 U.S.C. section 360bbb-3(b)(1), unless the authorization is terminated or revoked sooner.  Performed at [facility], [place] [address], [place], [place] [address]    Surgical pcr screen     Status: None   Collection Time: 05/09/20  4:53 AM   Specimen: Nasal Mucosa; Nasal Swab  Result Value Ref Range Status   MRSA, PCR NEGATIVE NEGATIVE Final   Staphylococcus aureus NEGATIVE NEGATIVE Final    Comment: (NOTE) The Xpert SA Assay (FDA approved for NASAL specimens in patients 65 years of age and older), is one component of Leonel Mccollum comprehensive surveillance program. It is not intended to diagnose infection nor to guide or monitor treatment. Performed at [facility], [place] [address] [place], [place] [address]   Blood culture (routine x 2)     Status: None (Preliminary result)   Collection Time: 05/13/20  8:45 PM   Specimen: BLOOD LEFT FOREARM  Result Value Ref Range Status   Specimen Description   Final    BLOOD LEFT FOREARM Performed at [facility] [address], [place], [place] [address]    Special Requests   Final    BOTTLES DRAWN AEROBIC AND ANAEROBIC Blood Culture results may not be optimal due to an excessive volume of blood received in culture bottles Performed at [facility] [address] [place], [place] [address]    Culture   Final    NO GROWTH < 12 HOURS Performed at [facility] [address], [place], [place] [address]    Report Status PENDING  Incomplete  Blood culture (routine x 2)     Status: None (Preliminary result)   Collection Time: 05/13/20  8:50 PM   Specimen: Right Antecubital; Blood  Result Value Ref Range Status   Specimen Description   Final    RIGHT ANTECUBITAL Performed at [facility] [address], [place], [place] [address]    Special Requests   Final    BOTTLES DRAWN AEROBIC AND ANAEROBIC Blood Culture results may not be optimal due to an excessive volume of blood received in culture bottles Performed at [facility] [address] [place], [place] [address]    Culture   Final    NO GROWTH < 12  HOURS Performed at [facility] [address], [place], [place]  [address]    Report Status PENDING  Incomplete  Resp Panel by RT-PCR (Flu Aubery Douthat&B, Covid)     Status: Abnormal   Collection Time: 05/14/20 12:51 AM   Specimen: Nasopharyngeal(NP) swabs in vial transport medium  Result Value Ref Range Status   SARS Coronavirus 2 by RT PCR POSITIVE (Leonia Heatherly) NEGATIVE Final    Comment: CRITICAL RESULT CALLED TO, READ BACK BY AND VERIFIED WITH: JESSE LAUBERMILK TN 05/14/20 @0238  BY P.HENDERSON (NOTE) SARS-CoV-2 target nucleic acids are DETECTED.  The SARS-CoV-2 RNA is generally detectable in upper respiratory specimens during the acute phase of infection. Positive results are indicative of the presence of the identified virus, but do not rule out bacterial infection or co-infection with other pathogens not detected by the test. Clinical correlation with patient history and other diagnostic information is necessary to determine patient infection status. The expected result is Negative.  Fact Sheet for Patients: EntrepreneurPulse.com.au  Fact Sheet for Healthcare Providers: IncredibleEmployment.be  This test is not yet approved or cleared by the [place] FDA and  has been authorized for detection and/or diagnosis of SARS-CoV-2 by FDA under an Emergency Use Authorization (EUA).  This EUA will remain in effect  (meaning this test can be used) for the duration of  the COVID-19 declaration under Section 564(b)(1) of the Act, 21 U.S.C. section 360bbb-3(b)(1), unless the authorization is terminated or revoked sooner.     Influenza Kierah Goatley by PCR NEGATIVE NEGATIVE Final   Influenza B by PCR NEGATIVE NEGATIVE Final    Comment: (NOTE) The Xpert Xpress SARS-CoV-2/FLU/RSV plus assay is intended as an aid in the diagnosis of influenza from Nasopharyngeal swab specimens and should not be used as Camilah Spillman sole basis for treatment. Nasal washings and aspirates are  unacceptable for Xpert Xpress SARS-CoV-2/FLU/RSV testing.  Fact Sheet for Patients: EntrepreneurPulse.com.au  Fact Sheet for Healthcare Providers: IncredibleEmployment.be  This test is not yet approved or cleared by the [place] FDA and has been authorized for detection and/or diagnosis of SARS-CoV-2 by FDA under an Emergency Use Authorization (EUA). This EUA will remain in effect (meaning this test can be used) for the duration of the COVID-19 declaration under Section 564(b)(1) of the Act, 21 U.S.C. section 360bbb-3(b)(1), unless the authorization is terminated or revoked.  Performed at [facility], [place] [address] [place], [place] [address]          Radiology Studies: CT ABDOMEN PELVIS W CONTRAST  Result Date: 05/13/2020 CLINICAL DATA:  Abdominal pain EXAM: CT ABDOMEN AND PELVIS WITH CONTRAST TECHNIQUE: Multidetector CT imaging of the abdomen and pelvis was performed using the standard protocol following bolus administration of intravenous contrast. CONTRAST:  133mL OMNIPAQUE IOHEXOL 300 MG/ML  SOLN COMPARISON:  None. FINDINGS: LOWER CHEST: Bibasilar atelectasis and small pleural effusions. HEPATOBILIARY: Small amount of perihepatic fluid. Mild pneumobilia. Fluid collection at the gallbladder fossa status post recent cholecystectomy. PANCREAS: Normal pancreas. No ductal dilatation or peripancreatic fluid collection. SPLEEN: Normal. ADRENALS/URINARY TRACT: The adrenal glands are normal. No hydronephrosis, nephroureterolithiasis or solid renal mass. The urinary bladder is normal for degree of distention STOMACH/BOWEL: There is no hiatal hernia. Normal duodenal course and caliber. No small bowel dilatation or inflammation. There is rectosigmoid diverticulosis with mild wall thickening. Normal appendix. VASCULAR/LYMPHATIC: There is calcific atherosclerosis of the abdominal aorta. No lymphadenopathy. REPRODUCTIVE: Normal prostate  size with symmetric seminal vesicles. MUSCULOSKELETAL. Multilevel degenerative disc disease and facet arthrosis. No bony spinal canal stenosis. OTHER: None. IMPRESSION: 1. Status post recent cholecystectomy and sphincterotomy with fluid collection  at the gallbladder fossa, and small volume pneumobilia. No specific features of infection. 2. Rectosigmoid diverticulosis with mild wall thickening, which may indicate early acute diverticulitis. 3. Small pleural effusions and bibasilar atelectasis. Aortic Atherosclerosis (ICD10-I70.0). Electronically Signed   By: Ulyses Jarred M.D.   On: 05/13/2020 23:31   DG Abdomen Acute W/Chest  Result Date: 05/13/2020 CLINICAL DATA:  Abdominal pain EXAM: DG ABDOMEN ACUTE WITH 1 VIEW CHEST COMPARISON:  None. FINDINGS: There is no evidence of dilated bowel loops or free intraperitoneal air. No radiopaque calculi or other significant radiographic abnormality is seen. Heart size and mediastinal contours are within normal limits. Both lungs are clear. IMPRESSION: Negative abdominal radiographs.  No acute cardiopulmonary disease. Electronically Signed   By: Ulyses Jarred M.D.   On: 05/13/2020 21:55        Scheduled Meds: . aspirin  81 mg Oral Daily  . enoxaparin (LOVENOX) injection  40 mg Subcutaneous Q24H  . isosorbide mononitrate  30 mg Oral Daily  . levothyroxine  125 mcg Oral QAC breakfast  . mirtazapine  15 mg Oral QHS  . pantoprazole  40 mg Oral Daily   Continuous Infusions: . piperacillin-tazobactam (ZOSYN)  IV Stopped (05/14/20 1231)  . potassium chloride 10 mEq (05/14/20 1335)     LOS: 0 days    Time spent: over 30 min    Fayrene Helper, MD [facility]   To contact the attending provider between 7A-7P or the covering provider during after hours 7P-7A, please log into the web site [email or web address] and access using universal [place] password for that web site. If you do not have the password, please call the hospital operator.  05/14/2020,  1:47 PM

## 2020-05-14 NOTE — ED Provider Notes (Signed)
Patient left at change of shift to check results of his CT of the abdomen/pelvis.  Patient was admitted on January 9 and had a cholecystectomy done on January 12.  He was discharged on January 13.  He presents with fever of 100 degrees with diarrhea, weakness, and lower abdominal cramping that began today, January 14.  Family reported fever.  His test results here show elevated white count, improving elevation of his LFTs, and a CT that shows questionable early diverticulitis which would fit with his complaints of lower abdominal pain and diarrhea.  I will discuss with surgery however he probably will be admitted to the hospital service.  Patient has been unable to provide a urine sample yet, he had already been given 1000 cc of fluids, he was given a second liter.  12:47 AM Dr. Harlow Asa, surgeon, has reviewed patient's test results earlier in the evening.  He states they will follow the patient however he requests that the hospitalist admit and manage his diverticulitis.  1:00 AM Dr. Myna Hidalgo, hospitalist will admit.   Results for orders placed or performed during the hospital encounter of 05/13/20  Comprehensive metabolic panel  Result Value Ref Range   Sodium 141 135 - 145 mmol/L   Potassium 3.2 (L) 3.5 - 5.1 mmol/L   Chloride 105 98 - 111 mmol/L   CO2 23 22 - 32 mmol/L   Glucose, Bld 107 (H) 70 - 99 mg/dL   BUN 21 8 - 23 mg/dL   Creatinine, Ser 1.19 0.61 - 1.24 mg/dL   Calcium 8.0 (L) 8.9 - 10.3 mg/dL   Total Protein 6.0 (L) 6.5 - 8.1 g/dL   Albumin 2.7 (L) 3.5 - 5.0 g/dL   AST 42 (H) 15 - 41 U/L   ALT 98 (H) 0 - 44 U/L   Alkaline Phosphatase 196 (H) 38 - 126 U/L   Total Bilirubin 1.0 0.3 - 1.2 mg/dL   GFR, Estimated 58 (L) >60 mL/min   Anion gap 13 5 - 15  Lipase, blood  Result Value Ref Range   Lipase 45 11 - 51 U/L  CBC with Diff  Result Value Ref Range   WBC 15.7 (H) 4.0 - 10.5 K/uL   RBC 3.32 (L) 4.22 - 5.81 MIL/uL   Hemoglobin 10.0 (L) 13.0 - 17.0 g/dL   HCT 31.1 (L) 39.0 - 52.0  %   MCV 93.7 80.0 - 100.0 fL   MCH 30.1 26.0 - 34.0 pg   MCHC 32.2 30.0 - 36.0 g/dL   RDW 13.7 11.5 - 15.5 %   Platelets 350 150 - 400 K/uL   nRBC 0.0 0.0 - 0.2 %   Neutrophils Relative % 88 %   Neutro Abs 13.9 (H) 1.7 - 7.7 K/uL   Lymphocytes Relative 3 %   Lymphs Abs 0.4 (L) 0.7 - 4.0 K/uL   Monocytes Relative 8 %   Monocytes Absolute 1.2 (H) 0.1 - 1.0 K/uL   Eosinophils Relative 0 %   Eosinophils Absolute 0.0 0.0 - 0.5 K/uL   Basophils Relative 0 %   Basophils Absolute 0.0 0.0 - 0.1 K/uL   Immature Granulocytes 1 %   Abs Immature Granulocytes 0.09 (H) 0.00 - 0.07 K/uL  Lactic acid, plasma  Result Value Ref Range   Lactic Acid, Venous 1.3 0.5 - 1.9 mmol/L    CT ABDOMEN PELVIS W CONTRAST  Result Date: 05/13/2020 CLINICAL DATA:  Abdominal pain EXAM: CT ABDOMEN AND PELVIS WITH CONTRAST TECHNIQUE: Multidetector CT imaging of the abdomen and  pelvis was performed using the standard protocol following bolus administration of intravenous contrast. CONTRAST:  131mL OMNIPAQUE IOHEXOL 300 MG/ML  SOLN COMPARISON:  None. FINDINGS: LOWER CHEST: Bibasilar atelectasis and small pleural effusions. HEPATOBILIARY: Small amount of perihepatic fluid. Mild pneumobilia. Fluid collection at the gallbladder fossa status post recent cholecystectomy. PANCREAS: Normal pancreas. No ductal dilatation or peripancreatic fluid collection. SPLEEN: Normal. ADRENALS/URINARY TRACT: The adrenal glands are normal. No hydronephrosis, nephroureterolithiasis or solid renal mass. The urinary bladder is normal for degree of distention STOMACH/BOWEL: There is no hiatal hernia. Normal duodenal course and caliber. No small bowel dilatation or inflammation. There is rectosigmoid diverticulosis with mild wall thickening. Normal appendix. VASCULAR/LYMPHATIC: There is calcific atherosclerosis of the abdominal aorta. No lymphadenopathy. REPRODUCTIVE: Normal prostate size with symmetric seminal vesicles. MUSCULOSKELETAL. Multilevel  degenerative disc disease and facet arthrosis. No bony spinal canal stenosis. OTHER: None. IMPRESSION: 1. Status post recent cholecystectomy and sphincterotomy with fluid collection at the gallbladder fossa, and small volume pneumobilia. No specific features of infection. 2. Rectosigmoid diverticulosis with mild wall thickening, which may indicate early acute diverticulitis. 3. Small pleural effusions and bibasilar atelectasis. Aortic Atherosclerosis (ICD10-I70.0). Electronically Signed   By: Ulyses Jarred M.D.   On: 05/13/2020 23:31    DG ERCP BILIARY & PANCREATIC DUCTS  Result Date: 05/11/2020 CLINICAL DATA:  Choledocholithiasis suspected on intraoperative cholangiogram EXAM: ERCP TECHNIQUE: Multiple spot images obtained with the fluoroscopic device and submitted for interpretation post-procedure. COMPARISON:  05/10/2020 FINDINGS: Series of fluoroscopic spot images document endoscopic cannulation and opacification of the CBD with subsequent balloon catheter passage. There is incomplete opacification of the intrahepatic biliary tree, which appears decompressed centrally. Cholecystectomy clips without extravasation. IMPRESSION: Endoscopic CBD cannulation and intervention as above. These images were submitted for radiologic interpretation only. Please see the procedural report for the amount of contrast and the fluoroscopy time utilized. Electronically Signed   By: Lucrezia Europe M.D.   On: 05/11/2020 13:23   DG Abdomen Acute W/Chest  Result Date: 05/13/2020 CLINICAL DATA:  Abdominal pain EXAM: DG ABDOMEN ACUTE WITH 1 VIEW CHEST COMPARISON:  None. FINDINGS: There is no evidence of dilated bowel loops or free intraperitoneal air. No radiopaque calculi or other significant radiographic abnormality is seen. Heart size and mediastinal contours are within normal limits. Both lungs are clear. IMPRESSION: Negative abdominal radiographs.  No acute cardiopulmonary disease. Electronically Signed   By: Ulyses Jarred M.D.    On: 05/13/2020 21:55   Diagnoses that have been ruled out:  None  Diagnoses that are still under consideration:  None  Final diagnoses:  Diverticulitis  Fever, unspecified fever cause  Diarrhea, unspecified type  S/P laparoscopic cholecystectomy   Plan admission  Rolland Porter, MD, Barbette Or, MD 05/14/20 [phone]

## 2020-05-15 LAB — COMPREHENSIVE METABOLIC PANEL
ALT: 59 U/L — ABNORMAL HIGH (ref 0–44)
AST: 27 U/L (ref 15–41)
Albumin: 2.2 g/dL — ABNORMAL LOW (ref 3.5–5.0)
Alkaline Phosphatase: 140 U/L — ABNORMAL HIGH (ref 38–126)
Anion gap: 12 (ref 5–15)
BUN: 19 mg/dL (ref 8–23)
CO2: 21 mmol/L — ABNORMAL LOW (ref 22–32)
Calcium: 7.5 mg/dL — ABNORMAL LOW (ref 8.9–10.3)
Chloride: 108 mmol/L (ref 98–111)
Creatinine, Ser: 1.22 mg/dL (ref 0.61–1.24)
GFR, Estimated: 56 mL/min — ABNORMAL LOW (ref >60)
Glucose, Bld: 81 mg/dL (ref 70–99)
Potassium: 3.3 mmol/L — ABNORMAL LOW (ref 3.5–5.1)
Sodium: 141 mmol/L (ref 135–145)
Total Bilirubin: 0.7 mg/dL (ref 0.3–1.2)
Total Protein: 5 g/dL — ABNORMAL LOW (ref 6.5–8.1)

## 2020-05-15 LAB — URINE CULTURE: Culture: NO GROWTH

## 2020-05-15 LAB — CBC
HCT: 27.2 % — ABNORMAL LOW (ref 39.0–52.0)
Hemoglobin: 8.6 g/dL — ABNORMAL LOW (ref 13.0–17.0)
MCH: 30 pg (ref 26.0–34.0)
MCHC: 31.6 g/dL (ref 30.0–36.0)
MCV: 94.8 fL (ref 80.0–100.0)
Platelets: 317 10*3/uL (ref 150–400)
RBC: 2.87 MIL/uL — ABNORMAL LOW (ref 4.22–5.81)
RDW: 14 % (ref 11.5–15.5)
WBC: 8.6 10*3/uL (ref 4.0–10.5)
nRBC: 0 % (ref 0.0–0.2)

## 2020-05-15 MED ORDER — LOPERAMIDE HCL 2 MG PO CAPS: 2.0000 mg | ORAL_CAPSULE | ORAL | Status: DC | PRN

## 2020-05-15 MED ORDER — POTASSIUM CHLORIDE CRYS ER 20 MEQ PO TBCR
40.0000 meq | EXTENDED_RELEASE_TABLET | Freq: Once | ORAL | Status: AC
  Administered 2020-05-15: 40 meq via ORAL
  Filled 2020-05-15: qty 2

## 2020-05-15 NOTE — Progress Notes (Signed)
Assessment & Plan: POD#5 - lap chole with IOC - Dr. Ninfa Linden  ERCP with stone extraction - 05/11/2020 - Dr. Watt Climes   Acute diverticulitis  IV Zosyn  Advance to full liquid diet today  Covid positive  Per medical service        Armandina Gemma, MD       [facility]       Office: [phone]   Chief Complaint: Abdominal pain, diarrhea  Subjective: Patient in bed, family at bedside.  Complains of diarrhea.  Denies pain.  Not hungry.  Objective: Vital signs in last 24 hours: Temp:  [98 F (36.7 C)-98.9 F (37.2 C)] 98.2 F (36.8 C) (01/16 0639) Pulse Rate:  [72-89] 73 (01/16 0639) Resp:  [16-26] 18 (01/16 0639) BP: (124-138)/(55-72) 132/55 (01/16 0639) SpO2:  [87 %-92 %] 92 % (01/16 0639) Weight:  [70.4 kg] 70.4 kg (01/16 0236) Last BM Date: 05/14/20  Intake/Output from previous day: 01/15 0701 - 01/16 0700 In: 118.2 [IV Piggyback:118.2] Out: 950 [Urine:950] Intake/Output this shift: No intake/output data recorded.  Physical Exam: HEENT - sclerae clear, mucous membranes moist Neck - soft Abdomen - soft without distension; wounds dry and intact; non-tender; no guarding or mass LLQ Ext - no edema, non-tender Neuro - alert & oriented, no focal deficits  Lab Results:  Recent Labs    05/14/20 0652 05/15/20 0504  WBC 12.1* 8.6  HGB 8.5* 8.6*  HCT 26.4* 27.2*  PLT 312 317   BMET Recent Labs    05/14/20 0652 05/15/20 0504  NA 142 141  K 3.1* 3.3*  CL 112* 108  CO2 22 21*  GLUCOSE 79 81  BUN 18 19  CREATININE 1.10 1.22  CALCIUM 7.3* 7.5*   PT/INR No results for input(s): LABPROT, INR in the last 72 hours. Comprehensive Metabolic Panel:    Component Value Date/Time   NA 141 05/15/2020 0504   NA 142 05/14/2020 0652   NA 142 09/11/2019 1022   NA 141 07/30/2019 1006   K 3.3 (L) 05/15/2020 0504   K 3.1 (L) 05/14/2020 0652   CL 108 05/15/2020 0504   CL 112 (H) 05/14/2020 0652   CO2 21 (L) 05/15/2020 0504   CO2 22 05/14/2020  0652   BUN 19 05/15/2020 0504   BUN 18 05/14/2020 0652   BUN 22 09/11/2019 1022   BUN 23 07/30/2019 1006   CREATININE 1.22 05/15/2020 0504   CREATININE 1.10 05/14/2020 0652   CREATININE 1.28 11/11/2014 0944   GLUCOSE 81 05/15/2020 0504   GLUCOSE 79 05/14/2020 0652   CALCIUM 7.5 (L) 05/15/2020 0504   CALCIUM 7.3 (L) 05/14/2020 0652   AST 27 05/15/2020 0504   AST 30 05/14/2020 0652   ALT 59 (H) 05/15/2020 0504   ALT 74 (H) 05/14/2020 0652   ALKPHOS 140 (H) 05/15/2020 0504   ALKPHOS 163 (H) 05/14/2020 0652   BILITOT 0.7 05/15/2020 0504   BILITOT 0.6 05/14/2020 0652   BILITOT <0.2 09/11/2019 1022   BILITOT <0.2 07/30/2019 1006   PROT 5.0 (L) 05/15/2020 0504   PROT 5.2 (L) 05/14/2020 0652   PROT 6.6 09/11/2019 1022   PROT 6.6 07/30/2019 1006   ALBUMIN 2.2 (L) 05/15/2020 0504   ALBUMIN 2.1 (L) 05/14/2020 0652   ALBUMIN 4.0 09/11/2019 1022   ALBUMIN 3.9 07/30/2019 1006    Studies/Results: CT ABDOMEN PELVIS W CONTRAST  Result Date: 05/13/2020 CLINICAL DATA:  Abdominal pain EXAM: CT ABDOMEN AND PELVIS WITH CONTRAST TECHNIQUE: Multidetector CT imaging  of the abdomen and pelvis was performed using the standard protocol following bolus administration of intravenous contrast. CONTRAST:  173mL OMNIPAQUE IOHEXOL 300 MG/ML  SOLN COMPARISON:  None. FINDINGS: LOWER CHEST: Bibasilar atelectasis and small pleural effusions. HEPATOBILIARY: Small amount of perihepatic fluid. Mild pneumobilia. Fluid collection at the gallbladder fossa status post recent cholecystectomy. PANCREAS: Normal pancreas. No ductal dilatation or peripancreatic fluid collection. SPLEEN: Normal. ADRENALS/URINARY TRACT: The adrenal glands are normal. No hydronephrosis, nephroureterolithiasis or solid renal mass. The urinary bladder is normal for degree of distention STOMACH/BOWEL: There is no hiatal hernia. Normal duodenal course and caliber. No small bowel dilatation or inflammation. There is rectosigmoid diverticulosis with mild  wall thickening. Normal appendix. VASCULAR/LYMPHATIC: There is calcific atherosclerosis of the abdominal aorta. No lymphadenopathy. REPRODUCTIVE: Normal prostate size with symmetric seminal vesicles. MUSCULOSKELETAL. Multilevel degenerative disc disease and facet arthrosis. No bony spinal canal stenosis. OTHER: None. IMPRESSION: 1. Status post recent cholecystectomy and sphincterotomy with fluid collection at the gallbladder fossa, and small volume pneumobilia. No specific features of infection. 2. Rectosigmoid diverticulosis with mild wall thickening, which may indicate early acute diverticulitis. 3. Small pleural effusions and bibasilar atelectasis. Aortic Atherosclerosis (ICD10-I70.0). Electronically Signed   By: Ulyses Jarred M.D.   On: 05/13/2020 23:31   DG Abdomen Acute W/Chest  Result Date: 05/13/2020 CLINICAL DATA:  Abdominal pain EXAM: DG ABDOMEN ACUTE WITH 1 VIEW CHEST COMPARISON:  None. FINDINGS: There is no evidence of dilated bowel loops or free intraperitoneal air. No radiopaque calculi or other significant radiographic abnormality is seen. Heart size and mediastinal contours are within normal limits. Both lungs are clear. IMPRESSION: Negative abdominal radiographs.  No acute cardiopulmonary disease. Electronically Signed   By: Ulyses Jarred M.D.   On: 05/13/2020 21:55      Armandina Gemma 05/15/2020  Patient ID: Gary Gonzalez, male   DOB: 03/24/1927, 87 y.o.   MRN: [id number]

## 2020-05-15 NOTE — Plan of Care (Signed)
  Problem: Education: Goal: Knowledge of General Education information will improve Description: Including pain rating scale, medication(s)/side effects and non-pharmacologic comfort measures 05/15/2020 0230 by Glenna Fellows D, RN Outcome: Progressing 05/15/2020 0229 by Glenna Fellows D, RN Outcome: Progressing   Problem: Health Behavior/Discharge Planning: Goal: Ability to manage health-related needs will improve 05/15/2020 0230 by Glenna Fellows D, RN Outcome: Progressing 05/15/2020 0229 by Glenna Fellows D, RN Outcome: Progressing   Problem: Clinical Measurements: Goal: Ability to maintain clinical measurements within normal limits will improve 05/15/2020 0230 by Glenna Fellows D, RN Outcome: Progressing 05/15/2020 0229 by Glenna Fellows D, RN Outcome: Progressing Goal: Will remain free from infection 05/15/2020 0230 by Glenna Fellows D, RN Outcome: Progressing 05/15/2020 0229 by Glenna Fellows D, RN Outcome: Progressing Goal: Diagnostic test results will improve 05/15/2020 0230 by Glenna Fellows D, RN Outcome: Progressing 05/15/2020 0229 by Glenna Fellows D, RN Outcome: Progressing Goal: Respiratory complications will improve 05/15/2020 0230 by Glenna Fellows D, RN Outcome: Progressing 05/15/2020 0229 by Glenna Fellows D, RN Outcome: Progressing Goal: Cardiovascular complication will be avoided 05/15/2020 0230 by Glenna Fellows D, RN Outcome: Progressing 05/15/2020 0229 by Glenna Fellows D, RN Outcome: Progressing   Problem: Activity: Goal: Risk for activity intolerance will decrease 05/15/2020 0230 by Glenna Fellows D, RN Outcome: Progressing 05/15/2020 0229 by Glenna Fellows D, RN Outcome: Progressing   Problem: Nutrition: Goal: Adequate nutrition will be maintained 05/15/2020 0230 by Glenna Fellows D, RN Outcome: Progressing 05/15/2020 0229 by Glenna Fellows D, RN Outcome: Progressing   Problem: Coping: Goal: Level of anxiety will decrease 05/15/2020 0230 by Glenna Fellows D, RN Outcome: Progressing 05/15/2020 0229 by Glenna Fellows D, RN Outcome: Progressing   Problem: Elimination: Goal: Will not experience complications related to bowel motility 05/15/2020 0230 by Glenna Fellows D, RN Outcome: Progressing 05/15/2020 0229 by Glenna Fellows D, RN Outcome: Progressing Goal: Will not experience complications related to urinary retention 05/15/2020 0230 by Tula Nakayama, RN Outcome: Progressing 05/15/2020 0229 by Glenna Fellows D, RN Outcome: Progressing   Problem: Pain Managment: Goal: General experience of comfort will improve 05/15/2020 0230 by Glenna Fellows D, RN Outcome: Progressing 05/15/2020 0229 by Glenna Fellows D, RN Outcome: Progressing   Problem: Safety: Goal: Ability to remain free from injury will improve 05/15/2020 0230 by Tula Nakayama, RN Outcome: Progressing 05/15/2020 0229 by Glenna Fellows D, RN Outcome: Progressing   Problem: Skin Integrity: Goal: Risk for impaired skin integrity will decrease 05/15/2020 0230 by Glenna Fellows D, RN Outcome: Progressing 05/15/2020 0229 by Glenna Fellows D, RN Outcome: Progressing   Problem: Education: Goal: Knowledge of risk factors and measures for prevention of condition will improve Outcome: Progressing   Problem: Respiratory: Goal: Will maintain a patent airway Outcome: Progressing Goal: Complications related to the disease process, condition or treatment will be avoided or minimized Outcome: Progressing

## 2020-05-15 NOTE — Evaluation (Signed)
Physical Therapy Evaluation Patient Details Name: Gary Gonzalez MRN: [id number] DOB: 12-May-1926 Today's Date: 05/15/2020   History of Present Illness  Gary Gonzalez is a 87 y.o. male with medical history significant for PVC induced cardiomyopathy, chronic kidney disease stage III, hypothyroidism, and recent cholecystitis with choledocholithiasis and biliary pancreatitis status post laparoscopic cholecystectomy on 05/10/2020 and ERCP with sphincterotomy the following day, now returning to the emergency department for evaluation of nausea, vomiting, diarrhea, and lower abdominal pain.  Patient reports that he had been doing well back at home after the recent hospitalization until yesterday when he developed acute onset of cramping discomfort in the lower abdomen, loose stools, nausea, and couple episodes of vomiting.  He felt generally weak and fatigued.  Did not notice any fevers or chills.  Denies any pain in his upper abdomen.  Clinical Impression  Pt feeling much better today , reports very little to no pain in lower abdominal area today. Does use a 4 wheeled RW at baseline at home and used a rW in room today to go to bathroom for a BM. Tolerated ambulating and sit tos tand with supervision. Feel pt is at baseline for mobility, no further PT needed at this time, none recommended at DC. Will sign off at this time. Please don't hesitate to  call if any other concerns arise with this patient while here for mobility.      Follow Up Recommendations No PT follow up    Equipment Recommendations  None recommended by PT    Recommendations for Other Services       Precautions / Restrictions        Mobility  Bed Mobility Overal bed mobility: Independent                  Transfers Overall transfer level: Independent Equipment used: Rolling walker (2 wheeled)                Ambulation/Gait Ambulation/Gait assistance: Counsellor (Feet): 20 Feet Assistive device: Rolling  walker (2 wheeled) Gait Pattern/deviations: Step-through pattern Gait velocity: normal pace   General Gait Details: ambulated in room to and from the bathroom ( due to [place] room could not do much more than this)  Stairs            Wheelchair Mobility    Modified Rankin (Stroke Patients Only)       Balance                                             Pertinent Vitals/Pain Pain Assessment: No/denies pain (pt states his pain is a lot better today)    Home Living Family/patient expects to be discharged to:: Private residence Living Arrangements: Spouse/significant other (supportive children who can come by as well) Available Help at Discharge: Family Type of Home: House Home Access: Stairs to enter   Technical brewer of Steps: 1 Home Layout: One level Home Equipment: Walker - 4 wheels Additional Comments: pt uses a RW due to balance issues at baseline. Other than that, pt is inependnet at baseline    Prior Function Level of Independence: Independent with assistive device(s)         Comments: with rollator     Hand Dominance        Extremity/Trunk Assessment        Lower Extremity Assessment Lower Extremity Assessment:  Overall WFL for tasks assessed       Communication   Communication: No difficulties  Cognition Arousal/Alertness: Awake/alert Behavior During Therapy: WFL for tasks assessed/performed Overall Cognitive Status: Within Functional Limits for tasks assessed                                        General Comments      Exercises     Assessment/Plan    PT Assessment Patent does not need any further PT services  PT Problem List Decreased strength;Decreased activity tolerance       PT Treatment Interventions      PT Goals (Current goals can be found in the Care Plan section)  Acute Rehab PT Goals PT Goal Formulation: All assessment and education complete, DC therapy    Frequency      Barriers to discharge        Co-evaluation               AM-PAC PT "6 Clicks" Mobility  Outcome Measure Help needed turning from your back to your side while in a flat bed without using bedrails?: None Help needed moving from lying on your back to sitting on the side of a flat bed without using bedrails?: None Help needed moving to and from a bed to a chair (including a wheelchair)?: None Help needed standing up from a chair using your arms (e.g., wheelchair or bedside chair)?: None Help needed to walk in hospital room?: A Little Help needed climbing 3-5 steps with a railing? : A Little 6 Click Score: 22    End of Session   Activity Tolerance: Patient tolerated treatment well Patient left: in bed;with call bell/phone within reach;with bed alarm set Nurse Communication: Mobility status PT Visit Diagnosis: Other abnormalities of gait and mobility (R26.89)    Time: 9417-4081 PT Time Calculation (min) (ACUTE ONLY): 26 min   Charges:   PT Evaluation $PT Eval Low Complexity: 1 Low PT Treatments $Gait Training: 8-22 mins        Serrina Minogue, PT, MPT Acute Rehabilitation Services Office: [phone] Pager: [phone] 05/15/2020   Clide Dales 05/15/2020, 4:36 PM

## 2020-05-15 NOTE — Progress Notes (Addendum)
PROGRESS NOTE    Gary Gonzalez  [id number] DOB: March 13, 1927 DOA: 05/13/2020 PCP: [name], MD   Chief Complaint  Patient presents with  . Abdominal Pain  . Emesis  . Weakness  . Diarrhea    Brief Narrative:  Gary Gonzalez is Gary Gonzalez 87 y.o. male with medical history significant for PVC induced cardiomyopathy, chronic kidney disease stage III, hypothyroidism, and recent cholecystitis with choledocholithiasis and biliary pancreatitis status post laparoscopic cholecystectomy on 05/10/2020 and ERCP with sphincterotomy the following day, now returning to the emergency department for evaluation of nausea, vomiting, diarrhea, and lower abdominal pain.  Patient reports that he had been doing well back at home after the recent hospitalization until yesterday when he developed acute onset of cramping discomfort in the lower abdomen, loose stools, nausea, and couple episodes of vomiting.  He felt generally weak and fatigued.  Did not notice any fevers or chills.  Denies any pain in his upper abdomen.  ED Course: Upon arrival to the ED, patient is found to be afebrile, saturating mid 90s on room air, and with stable blood pressure.  Chemistry panel is notable for improving LFTs, potassium 3.2, and stable renal insufficiency.  CBC notable for an increased leukocytosis, now 15,700, and stable normocytic anemia.  Lactic acid is normal.  Blood cultures were collected in the emergency department, IV fluids and antibiotics were administered, surgery was consulted by the ED physician, and medical admission was recommended.   Assessment & Plan:   Principal Problem:   Acute diverticulitis Active Problems:   ANEMIA, IRON DEFICIENCY   CAD (coronary artery disease)   CKD (chronic kidney disease), stage III (HCC)   Chronic combined systolic and diastolic CHF (congestive heart failure) (HCC)   Hypothyroidism (acquired)   Hypokalemia  1. Acute diverticulitis  - Presents with 1 day of N/V/D and crampy lower  abdominal pain after recent lap chole and ERCP and is found to have increased leukocytosis and CT-findings suggestive of early diverticulitis  - Blood and urine cultures NG - continue Zosyn   - full liquid diet, will ADAT  2. Cholecystitis and choledocholithiasis s/p lap chole and ERCP  - Status-post lap chole 1/11 and ERCP with sphincterotomy 1/12  - LFTs continue to improve, fluid-collection noted at gallbladder fossa on CT but no specific infectious features  - Surgery consulted by ED physician and much appreciated  -> not c/w bile leak per surgery, appreciate assistance  # COVID positive Asymptomatic, vaccinated with booster ([place]) Plan for 3 days remdesivir  3. CAD  - No anginal complaints, continue ASA and Imdur as tolerated    4. CKD IIIa  - continue to monitor  5. Anemia  - fluctuating, continue to monitor  6. Hypokalemia  Hypomagnesemia - replace and follow   7. PVC-induced cardiomyopathy   - Follows with cardiology, EF not well assessed on 2018 echo, no longer on amiodarone    8. Hypothyroidism  - Continue Synthroid   DVT prophylaxis: lovenox Code Status: full  Family Communication: none at bedside - wife - daughter Disposition:   Status is: Inpatient  Remains inpatient appropriate because:Inpatient level of care appropriate due to severity of illness   Dispo: The patient is from: Home              Anticipated d/c is to: Home              Anticipated d/c date is: > 3 days  Patient currently is not medically stable to d/c.       Consultants:   surgery  Procedures:  none  Antimicrobials:  Anti-infectives (From admission, onward)   Start     Dose/Rate Route Frequency Ordered Stop   05/15/20 1000  remdesivir 100 mg in sodium chloride 0.9 % 100 mL IVPB       "Followed by" Linked Group Details   100 mg 200 mL/hr over 30 Minutes Intravenous Daily 05/14/20 1657 05/19/20 0959   05/14/20 1800  remdesivir 200 mg in sodium chloride  0.9% 250 mL IVPB       "Followed by" Linked Group Details   200 mg 580 mL/hr over 30 Minutes Intravenous Once 05/14/20 1657 05/14/20 2317   05/14/20 0500  piperacillin-tazobactam (ZOSYN) IVPB 3.375 g        3.375 g 12.5 mL/hr over 240 Minutes Intravenous Every 8 hours 05/14/20 0323     05/14/20 0045  Ampicillin-Sulbactam (UNASYN) 3 g in sodium chloride 0.9 % 100 mL IVPB        3 g 200 mL/hr over 30 Minutes Intravenous  Once 05/14/20 0041 05/14/20 0208         Subjective: No complaints today Son at bedside  Objective: Vitals:   05/14/20 2142 05/15/20 0106 05/15/20 0236 05/15/20 0639  BP: (!) 128/59 138/72  (!) 132/55  Pulse: 79 89  73  Resp: 17 17  18   Temp: 98.9 F (37.2 C) 98.3 F (36.8 C)  98.2 F (36.8 C)  TempSrc: Oral Oral    SpO2: 92% (!) 87%  92%  Weight:   70.4 kg   Height:  6' (1.829 m)      Intake/Output Summary (Last 24 hours) at 05/15/2020 1110 Last data filed at 05/15/2020 0635 Gross per 24 hour  Intake 118.18 ml  Output 950 ml  Net -831.82 ml   Filed Weights   05/15/20 0236  Weight: 70.4 kg    Examination:  General: No acute distress. 87 yo male appears younger than age, pleasant. Cardiovascular: Heart sounds show Gary Gonzalez regular rate, and rhythm.  Lungs: Clear to auscultation bilaterally Abdomen: Soft, nontender, nondistended (sitting up in chair, surgical incisions not examined today) Neurological: Alert and oriented 3. Moves all extremities 4 . Cranial nerves II through XII grossly intact. Skin: Warm and dry. No rashes or lesions. Extremities: No clubbing or cyanosis. No edema.   Data Reviewed: I have personally reviewed following labs and imaging studies  CBC: Recent Labs  Lab 05/08/20 1539 05/09/20 0809 05/11/20 0346 05/12/20 0353 05/13/20 2044 05/14/20 0652 05/15/20 0504  WBC 10.5   < > 11.5* 11.1* 15.7* 12.1* 8.6  NEUTROABS 8.8*  --   --  9.9* 13.9*  --   --   HGB 12.3*   < > 9.6* 9.0* 10.0* 8.5* 8.6*  HCT 37.4*   < > 30.3* 28.2*  31.1* 26.4* 27.2*  MCV 91.4   < > 94.7 93.7 93.7 93.0 94.8  PLT 276   < > 256 248 350 312 317   < > = values in this interval not displayed.    Basic Metabolic Panel: Recent Labs  Lab 05/11/20 2230 05/12/20 0353 05/13/20 2044 05/14/20 0652 05/15/20 0504  NA 136 139 141 142 141  K 3.6 3.5 3.2* 3.1* 3.3*  CL 106 106 105 112* 108  CO2 21* 23 23 22  21*  GLUCOSE 199* 158* 107* 79 81  BUN 19 19 21 18 19   CREATININE 1.25* 1.20 1.19 1.10 1.22  CALCIUM 7.6* 7.8* 8.0* 7.3* 7.5*  MG  --   --   --  1.5*  --     GFR: Estimated Creatinine Clearance: 39.3 mL/min (by C-G formula based on SCr of 1.22 mg/dL).  Liver Function Tests: Recent Labs  Lab 05/11/20 2230 05/12/20 0353 05/13/20 2044 05/14/20 0652 05/15/20 0504  AST 75* 67* 42* 30 27  ALT 148* 146* 98* 74* 59*  ALKPHOS 205* 229* 196* 163* 140*  BILITOT 0.7 0.6 1.0 0.6 0.7  PROT 5.5* 5.8* 6.0* 5.2* 5.0*  ALBUMIN 2.5* 2.6* 2.7* 2.1* 2.2*    CBG: Recent Labs  Lab 05/09/20 2135  GLUCAP 117*     Recent Results (from the past 240 hour(s))  SARS CORONAVIRUS 2 (TAT 6-24 HRS) Nasopharyngeal Nasopharyngeal Swab     Status: None   Collection Time: 05/08/20  5:27 PM   Specimen: Nasopharyngeal Swab  Result Value Ref Range Status   SARS Coronavirus 2 NEGATIVE NEGATIVE Final    Comment: (NOTE) SARS-CoV-2 target nucleic acids are NOT DETECTED.  The SARS-CoV-2 RNA is generally detectable in upper and lower respiratory specimens during the acute phase of infection. Negative results do not preclude SARS-CoV-2 infection, do not rule out co-infections with other pathogens, and should not be used as the sole basis for treatment or other patient management decisions. Negative results must be combined with clinical observations, patient history, and epidemiological information. The expected result is Negative.  Fact Sheet for Patients: SugarRoll.be  Fact Sheet for Healthcare  Providers: https://www.woods-mathews.com/  This test is not yet approved or cleared by the [place] FDA and  has been authorized for detection and/or diagnosis of SARS-CoV-2 by FDA under an Emergency Use Authorization (EUA). This EUA will remain  in effect (meaning this test can be used) for the duration of the COVID-19 declaration under Se ction 564(b)(1) of the Act, 21 U.S.C. section 360bbb-3(b)(1), unless the authorization is terminated or revoked sooner.  Performed at [facility], [place] [address], [place], [place] [address]   Surgical pcr screen     Status: None   Collection Time: 05/09/20  4:53 AM   Specimen: Nasal Mucosa; Nasal Swab  Result Value Ref Range Status   MRSA, PCR NEGATIVE NEGATIVE Final   Staphylococcus aureus NEGATIVE NEGATIVE Final    Comment: (NOTE) The Xpert SA Assay (FDA approved for NASAL specimens in patients 69 years of age and older), is one component of Azia Toutant comprehensive surveillance program. It is not intended to diagnose infection nor to guide or monitor treatment. Performed at [facility], [place] [address], [place], [place] [address]   Blood culture (routine x 2)     Status: None (Preliminary result)   Collection Time: 05/13/20  8:45 PM   Specimen: BLOOD LEFT FOREARM  Result Value Ref Range Status   Specimen Description   Final    BLOOD LEFT FOREARM Performed at [place] [address], [place], [place] [address]    Special Requests   Final    BOTTLES DRAWN AEROBIC AND ANAEROBIC Blood Culture results may not be optimal due to an excessive volume of blood received in culture bottles Performed at [place] [address], [place], [place] [address]    Culture   Final    NO GROWTH 1 DAY Performed at [facility], [place] [address] [place], [place] [address]    Report Status PENDING  Incomplete  Urine Culture     Status: None  Collection Time: 05/13/20   8:45 PM   Specimen: Urine, Random  Result Value Ref Range Status   Specimen Description   Final    URINE, RANDOM Performed at [place] [address], [place], [place] [address]    Special Requests   Final    NONE Performed at [facility], [place] [address] [place], [place] [address]    Culture   Final    NO GROWTH Performed at [facility], [place] [address], [place], [place] [address]    Report Status 05/15/2020 FINAL  Final  Blood culture (routine x 2)     Status: None (Preliminary result)   Collection Time: 05/13/20  8:50 PM   Specimen: Right Antecubital; Blood  Result Value Ref Range Status   Specimen Description   Final    RIGHT ANTECUBITAL Performed at [place] [address] [place], [place] [address]    Special Requests   Final    BOTTLES DRAWN AEROBIC AND ANAEROBIC Blood Culture results may not be optimal due to an excessive volume of blood received in culture bottles Performed at [place] [address], [place], [place] [address]    Culture   Final    NO GROWTH 1 DAY Performed at [facility], [place] [address], [place], [place] [address]    Report Status PENDING  Incomplete  Resp Panel by RT-PCR (Flu Rumi Taras&B, Covid)     Status: Abnormal   Collection Time: 05/14/20 12:51 AM   Specimen: Nasopharyngeal(NP) swabs in vial transport medium  Result Value Ref Range Status   SARS Coronavirus 2 by RT PCR POSITIVE (Patsy Varma) NEGATIVE Final    Comment: CRITICAL RESULT CALLED TO, READ BACK BY AND VERIFIED WITH: JESSE LAUBERMILK TN 05/14/20 @0238  BY P.HENDERSON (NOTE) SARS-CoV-2 target nucleic acids are DETECTED.  The SARS-CoV-2 RNA is generally detectable in upper respiratory specimens during the acute phase of infection. Positive results are indicative of the presence of the identified virus, but do not rule out bacterial infection or co-infection with other pathogens not detected by  the test. Clinical correlation with patient history and other diagnostic information is necessary to determine patient infection status. The expected result is Negative.  Fact Sheet for Patients: EntrepreneurPulse.com.au  Fact Sheet for Healthcare Providers: IncredibleEmployment.be  This test is not yet approved or cleared by the [place] FDA and  has been authorized for detection and/or diagnosis of SARS-CoV-2 by FDA under an Emergency Use Authorization (EUA).  This EUA will remain in effect  (meaning this test can be used) for the duration of  the COVID-19 declaration under Section 564(b)(1) of the Act, 21 U.S.C. section 360bbb-3(b)(1), unless the authorization is terminated or revoked sooner.     Influenza Paullette Mckain by PCR NEGATIVE NEGATIVE Final   Influenza B by PCR NEGATIVE NEGATIVE Final    Comment: (NOTE) The Xpert Xpress SARS-CoV-2/FLU/RSV plus assay is intended as an aid in the diagnosis of influenza from Nasopharyngeal swab specimens and should not be used as Srihith Aquilino sole basis for treatment. Nasal washings and aspirates are unacceptable for Xpert Xpress SARS-CoV-2/FLU/RSV testing.  Fact Sheet for Patients: EntrepreneurPulse.com.au  Fact Sheet for Healthcare Providers: IncredibleEmployment.be  This test is not yet approved or cleared by the [place] FDA and has been authorized for detection and/or diagnosis of SARS-CoV-2 by FDA under an Emergency Use Authorization (EUA). This EUA will remain in effect (meaning this test can be used) for the duration of  the COVID-19 declaration under Section 564(b)(1) of the Act, 21 U.S.C. section 360bbb-3(b)(1), unless the authorization is terminated or revoked.  Performed at [facility], [place] [address] [place], [place] [address]          Radiology Studies: CT ABDOMEN PELVIS W CONTRAST  Result Date: 05/13/2020 CLINICAL DATA:   Abdominal pain EXAM: CT ABDOMEN AND PELVIS WITH CONTRAST TECHNIQUE: Multidetector CT imaging of the abdomen and pelvis was performed using the standard protocol following bolus administration of intravenous contrast. CONTRAST:  151mL OMNIPAQUE IOHEXOL 300 MG/ML  SOLN COMPARISON:  None. FINDINGS: LOWER CHEST: Bibasilar atelectasis and small pleural effusions. HEPATOBILIARY: Small amount of perihepatic fluid. Mild pneumobilia. Fluid collection at the gallbladder fossa status post recent cholecystectomy. PANCREAS: Normal pancreas. No ductal dilatation or peripancreatic fluid collection. SPLEEN: Normal. ADRENALS/URINARY TRACT: The adrenal glands are normal. No hydronephrosis, nephroureterolithiasis or solid renal mass. The urinary bladder is normal for degree of distention STOMACH/BOWEL: There is no hiatal hernia. Normal duodenal course and caliber. No small bowel dilatation or inflammation. There is rectosigmoid diverticulosis with mild wall thickening. Normal appendix. VASCULAR/LYMPHATIC: There is calcific atherosclerosis of the abdominal aorta. No lymphadenopathy. REPRODUCTIVE: Normal prostate size with symmetric seminal vesicles. MUSCULOSKELETAL. Multilevel degenerative disc disease and facet arthrosis. No bony spinal canal stenosis. OTHER: None. IMPRESSION: 1. Status post recent cholecystectomy and sphincterotomy with fluid collection at the gallbladder fossa, and small volume pneumobilia. No specific features of infection. 2. Rectosigmoid diverticulosis with mild wall thickening, which may indicate early acute diverticulitis. 3. Small pleural effusions and bibasilar atelectasis. Aortic Atherosclerosis (ICD10-I70.0). Electronically Signed   By: Ulyses Jarred M.D.   On: 05/13/2020 23:31   DG Abdomen Acute W/Chest  Result Date: 05/13/2020 CLINICAL DATA:  Abdominal pain EXAM: DG ABDOMEN ACUTE WITH 1 VIEW CHEST COMPARISON:  None. FINDINGS: There is no evidence of dilated bowel loops or free intraperitoneal air. No  radiopaque calculi or other significant radiographic abnormality is seen. Heart size and mediastinal contours are within normal limits. Both lungs are clear. IMPRESSION: Negative abdominal radiographs.  No acute cardiopulmonary disease. Electronically Signed   By: Ulyses Jarred M.D.   On: 05/13/2020 21:55        Scheduled Meds: . aspirin  81 mg Oral Daily  . enoxaparin (LOVENOX) injection  40 mg Subcutaneous Q24H  . isosorbide mononitrate  30 mg Oral Daily  . levothyroxine  125 mcg Oral QAC breakfast  . mirtazapine  15 mg Oral QHS  . pantoprazole  40 mg Oral Daily   Continuous Infusions: . piperacillin-tazobactam (ZOSYN)  IV 3.375 g (05/15/20 0552)  . remdesivir 100 mg in NS 100 mL 100 mg (05/15/20 1021)     LOS: 1 day    Time spent: over 30 min    Fayrene Helper, MD [facility]   To contact the attending provider between 7A-7P or the covering provider during after hours 7P-7A, please log into the web site [email or web address] and access using universal [place] password for that web site. If you do not have the password, please call the hospital operator.  05/15/2020, 11:10 AM

## 2020-05-16 ENCOUNTER — Inpatient Hospital Stay (HOSPITAL_COMMUNITY): Payer: PPO

## 2020-05-16 DIAGNOSIS — R7989 Other specified abnormal findings of blood chemistry: Secondary | ICD-10-CM

## 2020-05-16 DIAGNOSIS — U071 COVID-19: Secondary | ICD-10-CM

## 2020-05-16 LAB — CBC
HCT: 26.7 % — ABNORMAL LOW (ref 39.0–52.0)
Hemoglobin: 8.5 g/dL — ABNORMAL LOW (ref 13.0–17.0)
MCH: 29.7 pg (ref 26.0–34.0)
MCHC: 31.8 g/dL (ref 30.0–36.0)
MCV: 93.4 fL (ref 80.0–100.0)
Platelets: 369 10*3/uL (ref 150–400)
RBC: 2.86 MIL/uL — ABNORMAL LOW (ref 4.22–5.81)
RDW: 13.9 % (ref 11.5–15.5)
WBC: 7.5 10*3/uL (ref 4.0–10.5)
nRBC: 0 % (ref 0.0–0.2)

## 2020-05-16 LAB — FOLATE: Folate: >24.8 ng/mL (ref >5.9)

## 2020-05-16 LAB — COMPREHENSIVE METABOLIC PANEL
ALT: 48 U/L — ABNORMAL HIGH (ref 0–44)
AST: 26 U/L (ref 15–41)
Albumin: 2.2 g/dL — ABNORMAL LOW (ref 3.5–5.0)
Alkaline Phosphatase: 124 U/L (ref 38–126)
Anion gap: 11 (ref 5–15)
BUN: 19 mg/dL (ref 8–23)
CO2: 21 mmol/L — ABNORMAL LOW (ref 22–32)
Calcium: 7.6 mg/dL — ABNORMAL LOW (ref 8.9–10.3)
Chloride: 111 mmol/L (ref 98–111)
Creatinine, Ser: 1.23 mg/dL (ref 0.61–1.24)
GFR, Estimated: 55 mL/min — ABNORMAL LOW (ref >60)
Glucose, Bld: 91 mg/dL (ref 70–99)
Potassium: 3.4 mmol/L — ABNORMAL LOW (ref 3.5–5.1)
Sodium: 143 mmol/L (ref 135–145)
Total Bilirubin: 0.6 mg/dL (ref 0.3–1.2)
Total Protein: 4.9 g/dL — ABNORMAL LOW (ref 6.5–8.1)

## 2020-05-16 LAB — PHOSPHORUS: Phosphorus: 2.2 mg/dL — ABNORMAL LOW (ref 2.5–4.6)

## 2020-05-16 LAB — IRON AND TIBC
Iron: 27 ug/dL — ABNORMAL LOW (ref 45–182)
Saturation Ratios: 15 % — ABNORMAL LOW (ref 17.9–39.5)
TIBC: 180 ug/dL — ABNORMAL LOW (ref 250–450)
UIBC: 153 ug/dL

## 2020-05-16 LAB — FERRITIN: Ferritin: 187 ng/mL (ref 24–336)

## 2020-05-16 LAB — C-REACTIVE PROTEIN: CRP: 11.7 mg/dL — ABNORMAL HIGH (ref <1.0)

## 2020-05-16 LAB — VITAMIN B12: Vitamin B-12: 1397 pg/mL — ABNORMAL HIGH (ref 180–914)

## 2020-05-16 LAB — MAGNESIUM: Magnesium: 2 mg/dL (ref 1.7–2.4)

## 2020-05-16 LAB — D-DIMER, QUANTITATIVE: D-Dimer, Quant: 14.19 ug/mL-FEU — ABNORMAL HIGH (ref 0.00–0.50)

## 2020-05-16 MED ORDER — FERROUS SULFATE 325 (65 FE) MG PO TABS: 325.0000 mg | ORAL_TABLET | Freq: Every day | ORAL | 3 refills | Status: AC

## 2020-05-16 MED ORDER — LOPERAMIDE HCL 2 MG PO CAPS: 2.0000 mg | ORAL_CAPSULE | ORAL | 0 refills | Status: DC | PRN

## 2020-05-16 MED ORDER — AMOXICILLIN-POT CLAVULANATE 875-125 MG PO TABS: 1.0000 | ORAL_TABLET | Freq: Two times a day (BID) | ORAL | 0 refills | Status: DC

## 2020-05-16 MED ORDER — AMOXICILLIN-POT CLAVULANATE 875-125 MG PO TABS: 1.0000 | ORAL_TABLET | Freq: Two times a day (BID) | ORAL | 0 refills | Status: AC

## 2020-05-16 NOTE — Progress Notes (Signed)
    CC: Abdominal pain/diarrhea  Subjective: Patient says his diarrhea is better, and his abdominal pain has resolved.  Minimal abdominal discomfort from his cholecystectomy.  All the port sites look fine.  He is tolerating a full liquid diet.  Objective: Vital signs in last 24 hours: Temp:  [97.7 F (36.5 C)-98.4 F (36.9 C)] 98.4 F (36.9 C) (01/17 1884) Pulse Rate:  [65-72] 65 (01/17 0632) Resp:  [16-24] 16 (01/17 1660) BP: (129-133)/(47-60) 133/47 (01/17 6301) SpO2:  [94 %-100 %] 94 % (01/17 6010) Weight:  [71 kg] 71 kg (01/16 2207) Last BM Date: 05/15/20 Afebrile, VSS sats good on room air K+ 3.4 Total protein/albumin4.9/2.2 WBC 7.5 COVID positive  Intake/Output from previous day: 01/16 0701 - 01/17 0700 In: 100 [IV Piggyback:100] Out: 300 [Urine:300] Intake/Output this shift: Total I/O In: 120 [P.O.:120] Out: 275 [Urine:275]  General appearance: alert, cooperative and no distress Resp: clear to auscultation bilaterally and He denies any respiratory symptoms. GI: Soft, normal postop soreness.  Port sites all look fine.  No lower abdominal pain.  Diarrhea is improving.  Lab Results:  Recent Labs    05/15/20 0504 05/16/20 0447  WBC 8.6 7.5  HGB 8.6* 8.5*  HCT 27.2* 26.7*  PLT 317 369    BMET Recent Labs    05/15/20 0504 05/16/20 0447  NA 141 143  K 3.3* 3.4*  CL 108 111  CO2 21* 21*  GLUCOSE 81 91  BUN 19 19  CREATININE 1.22 1.23  CALCIUM 7.5* 7.6*   PT/INR No results for input(s): LABPROT, INR in the last 72 hours.  Recent Labs  Lab 05/12/20 0353 05/13/20 2044 05/14/20 0652 05/15/20 0504 05/16/20 0447  AST 67* 42* 30 27 26   ALT 146* 98* 74* 59* 48*  ALKPHOS 229* 196* 163* 140* 124  BILITOT 0.6 1.0 0.6 0.7 0.6  PROT 5.8* 6.0* 5.2* 5.0* 4.9*  ALBUMIN 2.6* 2.7* 2.1* 2.2* 2.2*     Lipase     Component Value Date/Time   LIPASE 45 05/13/2020 2044     Medications: . aspirin  81 mg Oral Daily  . enoxaparin (LOVENOX) injection  40  mg Subcutaneous Q24H  . isosorbide mononitrate  30 mg Oral Daily  . levothyroxine  125 mcg Oral QAC breakfast  . mirtazapine  15 mg Oral QHS  . pantoprazole  40 mg Oral Daily   . piperacillin-tazobactam (ZOSYN)  IV 3.375 g (05/15/20 2242)  . remdesivir 100 mg in NS 100 mL 100 mg (05/16/20 0953)    Assessment/Plan Anemia Hx CHF systolic/diastolic CAD/Cardiomyopathy Hypothyroid COVID POSITIVE 05/14/20 COVID NEGATIVE 05/08/20 MILD malnutrtion  Acute diverticulitis S/P laparoscopic cholecystectomy with intraoperative cholangiogram 05/10/2020 DR. Coralie Keens S/p ERCP with removal of calculi/debris from the biliary/pancreatic ducts, sphincterotomy/papillotomy 05/11/2020 Dr. Clarene Essex   XNA:TFTD liquids ID:  remdesivir 1/14>> day 3;  Zosyn 1/15>> day 3 DVT:  Lovenox Follow up:  06/02/20, [facility]  Plan: I will advance him to a soft diet.  If he tolerates this well he can probably go home on oral antibiotics.  He has a follow-up with [place] for his laparoscopic cholecystectomy.  I would recommend follow-up with his PCP/GI after he has completed his course of antibiotics.  I would let them decide whether he needs a colonoscopy after his diverticulitis.  Normally we recommend GI follow-up with colonoscopy 6 weeks after antibiotic therapy is completed.   LOS: 2 days    Danilyn Cocke 05/16/2020 Please see Amion

## 2020-05-16 NOTE — Plan of Care (Signed)
  Problem: Education: Goal: Knowledge of General Education information will improve Description: Including pain rating scale, medication(s)/side effects and non-pharmacologic comfort measures Outcome: Adequate for Discharge   Problem: Health Behavior/Discharge Planning: Goal: Ability to manage health-related needs will improve Outcome: Adequate for Discharge   Problem: Clinical Measurements: Goal: Ability to maintain clinical measurements within normal limits will improve Outcome: Adequate for Discharge Goal: Will remain free from infection Outcome: Adequate for Discharge Goal: Diagnostic test results will improve Outcome: Adequate for Discharge Goal: Respiratory complications will improve Outcome: Adequate for Discharge Goal: Cardiovascular complication will be avoided Outcome: Adequate for Discharge   Problem: Activity: Goal: Risk for activity intolerance will decrease Outcome: Adequate for Discharge   Problem: Nutrition: Goal: Adequate nutrition will be maintained Outcome: Adequate for Discharge   Problem: Coping: Goal: Level of anxiety will decrease Outcome: Adequate for Discharge   Problem: Elimination: Goal: Will not experience complications related to bowel motility Outcome: Adequate for Discharge Goal: Will not experience complications related to urinary retention Outcome: Adequate for Discharge   Problem: Pain Managment: Goal: General experience of comfort will improve Outcome: Adequate for Discharge   Problem: Safety: Goal: Ability to remain free from injury will improve Outcome: Adequate for Discharge   Problem: Skin Integrity: Goal: Risk for impaired skin integrity will decrease Outcome: Adequate for Discharge   Problem: Education: Goal: Knowledge of risk factors and measures for prevention of condition will improve Outcome: Adequate for Discharge   Problem: Respiratory: Goal: Will maintain a patent airway Outcome: Adequate for Discharge Goal:  Complications related to the disease process, condition or treatment will be avoided or minimized Outcome: Adequate for Discharge   

## 2020-05-16 NOTE — Progress Notes (Signed)
Spoke with Pharmacy regarding the delay of finishing Zosyn d/t to loss of IV access.  Pharmacy to reschedule Zosyn for 10am.  0600 dose will not be given.

## 2020-05-16 NOTE — Progress Notes (Signed)
Lower extremity venous bilateral study completed.   Please see CV Proc for preliminary results.   Lisseth Brazeau, RDMS  

## 2020-05-16 NOTE — Discharge Summary (Addendum)
Physician Discharge Summary  OMI TAVELLA YIR:[id number] DOB: [birth year] DOA: 05/13/2020  PCP: Rodrigo Ran, MD  Admit date: 05/13/2020 Discharge date: 05/16/2020  Time spent: 40 minutes  Recommendations for Outpatient Follow-up:  1.  follow outpatient CBC/CMP 2. Follow quarantine guidelines per CDC 3. Follow up with PCP/GI outpatient to discuss ? Colonoscopy after abx for diverticulitis 4. Follow up with surgery outpatient for cholecystectomy  Discharge Diagnoses:  Principal Problem:   Acute diverticulitis Active Problems:   ANEMIA, IRON DEFICIENCY   CAD (coronary artery disease)   CKD (chronic kidney disease), stage III (HCC)   Chronic combined systolic and diastolic CHF (congestive heart failure) (HCC)   Hypothyroidism (acquired)   Hypokalemia   Discharge Condition: stable  Diet recommendation: heart healthy  Filed Weights   05/15/20 0236 05/15/20 2207  Weight: 70.4 kg 71 kg    History of present illness:  Gary Gonzalez Gary Gonzalez 87 y.o.malewith medical history significant forPVC induced cardiomyopathy, chronic kidney disease stage III, hypothyroidism, and recent cholecystitis with choledocholithiasis and biliary pancreatitis status post laparoscopic cholecystectomy on 05/10/2020 and ERCP with sphincterotomy the following day, now returning to the emergency department for evaluation of nausea, vomiting, diarrhea, and lower abdominal pain.Patient reports that he had been doing well back at home after the recent hospitalization until yesterday when he developed acute onset of cramping discomfort in the lower abdomen, loose stools, nausea, and couple episodes of vomiting. He felt generally weak and fatigued. Did not notice any fevers or chills. Denies any pain in his upper abdomen.  ED Course:Upon arrival to the ED, patient is found to be afebrile, saturating mid 90s on room air, and with stable blood pressure. Chemistry panel is notable for improving LFTs, potassium 3.2, and  stable renal insufficiency. CBC notable for an increased leukocytosis, now 15,700, and stable normocytic anemia. Lactic acid is normal. Blood cultures were collected in the emergency department, IV fluids and antibiotics were administered, surgery was consulted by the ED physician, and medical admission was recommended.  He was admitted for diverticulitis and has improved with abx.  He was found to have covid and given 3 days remdesivir.  Stable for discharge 1/17 on oral abx.  See below for additional details  Hospital Course:  1.Acute diverticulitis -Presents with 1 day of N/V/D and crampy lower abdominal pain after recent lap chole and ERCP and is found to have increased leukocytosis and CT-findings suggestive of early diverticulitis -Blood and urine cultures NG - surgery recommending follow up with GI/PCP, will need to discuss colonoscopy 6 weeks after abx -continue Zosyn1/14 - 1/17 -> will d/c with augmentin for additional 7 days  - full liquid diet, will ADAT  2.Cholecystitis and choledocholithiasis s/p lap chole and ERCP -Status-post lap chole 1/11 and ERCP with sphincterotomy 1/12 -LFTs continue to improve, fluid-collection noted at gallbladder fossa on CT but no specific infectious features -Surgery consulted by ED physician and much appreciated-> not c/w bile leak per surgery, appreciate assistance -> follow up as scheduled for cholecystectomy  # COVID positive Asymptomatic, vaccinated with booster (pfizer) Plan for 3 days remdesivir Quarantine per CDC guidelines until 1/21 and then with mask until 1/26  # Elevated d dimer Due to recent surgery, diverticulitis, covid infeciton LE [place] without DVT - no resp symptoms, no indication for CT chest   3.CAD -No anginal complaints, continue ASA and Imdur as tolerated  4.CKD IIIa -continue to monitor  5.Anemia  Iron Def -fluctuating, continue to monitor - start iron on  discharge  6.Hypokalemia  Hypomagnesemia - replace and follow   7.PVC-induced cardiomyopathy -Follows with cardiology, EF not well assessed on 2018 echo, no longer on amiodarone   8. Hypothyroidism  - Continue Synthroid  # Diarrhea Imodium, consider additional w/u outpatient as needed 1 episode of incontinence of stool today, not c/w c diff - follow outpatient, consider further testing as indicated  Procedures: LE [place] Summary:  RIGHT:  - There is no evidence of deep vein thrombosis in the lower extremity.    - No cystic structure found in the popliteal fossa.    LEFT:  - There is no evidence of deep vein thrombosis in the lower extremity.    - Rital Cavey cystic structure is found in the popliteal fossa measuring 2.8 cm.    *See table(s) above for measurements and observations.   Consultations:  surgery  Discharge Exam: Vitals:   05/16/20 0632 05/16/20 1323  BP: (!) 133/47 (!) 149/67  Pulse: 65 70  Resp: 16 20  Temp: 98.4 F (36.9 C) 97.6 F (36.4 C)  SpO2: 94% 93%   No new complaints Feels well  General: No acute distress. Cardiovascular: Heart sounds show Gary Gonzalez regular rate, and rhythm.  Lungs: Clear to auscultation bilaterally  Abdomen: Soft, nontender, nondistended - well appearing surgical incisions Neurological: Alert and oriented 3. Moves all extremities 4 . Cranial nerves II through XII grossly intact. Skin: Warm and dry. No rashes or lesions. Extremities: No clubbing or cyanosis. No edema.   Discharge Instructions   Discharge Instructions    Call MD for:  difficulty breathing, headache or visual disturbances   Complete by: As directed    Call MD for:  extreme fatigue   Complete by: As directed    Call MD for:  hives   Complete by: As directed    Call MD for:  persistant dizziness or light-headedness   Complete by: As directed    Call MD for:  persistant nausea and vomiting   Complete by: As directed    Call MD for:  redness, tenderness,  or signs of infection (pain, swelling, redness, odor or green/yellow discharge around incision site)   Complete by: As directed    Call MD for:  severe uncontrolled pain   Complete by: As directed    Call MD for:  temperature >100.4   Complete by: As directed    Diet - low sodium heart healthy   Complete by: As directed    Discharge instructions   Complete by: As directed    You were seen for diverticulitis.    You've improved with antibiotics.  We'll send you home with an additional 7 days of antibiotics.  You should follow up with your PCP and gastroenterologist in 6 weeks and discuss Gary Gonzalez possible colonoscopy.  You had covid and we treated you with remdesivir.  You should quarantine until 05/20/20.  Then you should wear Claudett Bayly mask for the next five days (until 05/25/20).  You have iron deficiency anemia, we've started you on iron.  This can sometimes cause constipation, you may need to start Jerie Basford stool softner if you notice this.  We'll give you imodium for your diarrhea.  If this continues, you should get worked up further with your PCP.  Return for new, recurrent, or worsening symptoms.  Please ask your PCP to request records from this hospitalization so they know what was done and what the next steps will be.   Increase activity slowly   Complete by: As directed    No wound care  Complete by: As directed      Allergies as of 05/16/2020      Reactions   Atenolol Diarrhea, Other (See Comments)   Very weak, fatigued, lethargic, no appetite      Medication List    TAKE these medications   amoxicillin-clavulanate 875-125 MG tablet Commonly known as: Augmentin Take 1 tablet by mouth 2 (two) times daily for 7 days.   aspirin 81 MG tablet Take 81 mg by mouth daily.   ezetimibe 10 MG tablet Commonly known as: ZETIA Take 10 mg by mouth daily.   ferrous sulfate 325 (65 FE) MG tablet Take 1 tablet (325 mg total) by mouth daily.   isosorbide mononitrate 30 MG 24 hr tablet Commonly  known as: IMDUR Take 30 mg by mouth daily. What changed: Another medication with the same name was removed. Continue taking this medication, and follow the directions you see here.   levothyroxine 125 MCG tablet Commonly known as: SYNTHROID Take 1 tablet by mouth daily before breakfast.   loperamide 2 MG capsule Commonly known as: IMODIUM Take 1 capsule (2 mg total) by mouth as needed for diarrhea or loose stools.   mirtazapine 15 MG tablet Commonly known as: REMERON Take 15 mg by mouth at bedtime.   nitroGLYCERIN 0.4 MG SL tablet Commonly known as: NITROSTAT Place 0.4 mg under the tongue every 5 (five) minutes as needed for chest pain.   nitroGLYCERIN 0.4 MG SL tablet Commonly known as: NITROSTAT Place 1 tablet (0.4 mg total) under the tongue every 5 (five) minutes as needed for chest pain.   omeprazole 20 MG capsule Commonly known as: PRILOSEC Take 20 mg by mouth daily.   Repatha SureClick 140 MG/ML Soaj Generic drug: Evolocumab Inject 140 mg into the skin every 28 (twenty-eight) days.   traMADol 50 MG tablet Commonly known as: Ultram Take 1 tablet (50 mg total) by mouth every 6 (six) hours as needed for up to 5 days (pain not controlled with tylenol).      Allergies  Allergen Reactions  . Atenolol Diarrhea and Other (See Comments)    Very weak, fatigued, lethargic, no appetite      The results of significant diagnostics from this hospitalization (including imaging, microbiology, ancillary and laboratory) are listed below for reference.    Significant Diagnostic Studies: DG Cholangiogram Operative  Result Date: 05/10/2020 CLINICAL DATA:  Cholelithiasis. EXAM: INTRAOPERATIVE CHOLANGIOGRAM TECHNIQUE: Cholangiographic images from the C-arm fluoroscopic device were submitted for interpretation post-operatively. Please see the procedural report for the amount of contrast and the fluoroscopy time utilized. COMPARISON:  None. FINDINGS: Submitted images demonstrate  opacification of the biliary tree through cystic duct remnant. There is partial opacification of the pancreatic duct the she sees he is to without significant abnormality. Multiple small filling defects seen within the distal common bile duct most likely related to calculi. There is flow of contrast into the duodenum. IMPRESSION: Intraoperative cholangiogram as above. Electronically Signed   By: Acquanetta BellingFarhaan  Mir M.D.   On: 05/10/2020 11:07   CT ABDOMEN PELVIS W CONTRAST  Result Date: 05/13/2020 CLINICAL DATA:  Abdominal pain EXAM: CT ABDOMEN AND PELVIS WITH CONTRAST TECHNIQUE: Multidetector CT imaging of the abdomen and pelvis was performed using the standard protocol following bolus administration of intravenous contrast. CONTRAST:  100mL OMNIPAQUE IOHEXOL 300 MG/ML  SOLN COMPARISON:  None. FINDINGS: LOWER CHEST: Bibasilar atelectasis and small pleural effusions. HEPATOBILIARY: Small amount of perihepatic fluid. Mild pneumobilia. Fluid collection at the gallbladder fossa status post recent cholecystectomy. PANCREAS:  Normal pancreas. No ductal dilatation or peripancreatic fluid collection. SPLEEN: Normal. ADRENALS/URINARY TRACT: The adrenal glands are normal. No hydronephrosis, nephroureterolithiasis or solid renal mass. The urinary bladder is normal for degree of distention STOMACH/BOWEL: There is no hiatal hernia. Normal duodenal course and caliber. No small bowel dilatation or inflammation. There is rectosigmoid diverticulosis with mild wall thickening. Normal appendix. VASCULAR/LYMPHATIC: There is calcific atherosclerosis of the abdominal aorta. No lymphadenopathy. REPRODUCTIVE: Normal prostate size with symmetric seminal vesicles. MUSCULOSKELETAL. Multilevel degenerative disc disease and facet arthrosis. No bony spinal canal stenosis. OTHER: None. IMPRESSION: 1. Status post recent cholecystectomy and sphincterotomy with fluid collection at the gallbladder fossa, and small volume pneumobilia. No specific  features of infection. 2. Rectosigmoid diverticulosis with mild wall thickening, which may indicate early acute diverticulitis. 3. Small pleural effusions and bibasilar atelectasis. Aortic Atherosclerosis (ICD10-I70.0). Electronically Signed   By: Ulyses Jarred M.D.   On: 05/13/2020 23:31   MR 3D Recon At Scanner  Result Date: 05/09/2020 CLINICAL DATA:  Abdominal pain cholelithiasis, dilated CBD EXAM: MRI ABDOMEN WITHOUT AND WITH CONTRAST (INCLUDING MRCP) TECHNIQUE: Multiplanar multisequence MR imaging of the abdomen was performed both before and after the administration of intravenous contrast. Heavily T2-weighted images of the biliary and pancreatic ducts were obtained, and three-dimensional MRCP images were rendered by post processing. CONTRAST:  49mL GADAVIST GADOBUTROL 1 MMOL/ML IV SOLN COMPARISON:  Right upper quadrant ultrasound dated 05/08/2020. CT abdomen dated 02/26/2020 FINDINGS: Motion degraded images. Lower chest: Lung bases are clear. Hepatobiliary: No morphologic findings of cirrhosis. No hepatic steatosis. Mildly heterogeneous early arterial perfusion (series 22/image 37) which normalizes on later phases. No suspicious/enhancing hepatic lesions. Layering gallstones (series 16/image 23), with very mild gallbladder wall thickening/edema (series 3/image 23). No intrahepatic ductal dilatation. Mildly dilated common duct, measuring 10 mm (series 5/image 14), unchanged from prior CT. No choledocholithiasis is seen. Pancreas:  Within normal limits. Spleen:  Within normal limits. Adrenals/Urinary Tract:  Adrenal glands are within normal limits. Tiny bilateral renal cysts measuring up to 5 mm (series 5/image 18 and 22). No hydronephrosis. Stomach/Bowel: Stomach is within normal limits. Visualized bowel is grossly unremarkable. Vascular/Lymphatic:  No evidence of abdominal aortic aneurysm. No suspicious abdominal lymphadenopathy. Other:  No abdominal ascites. Musculoskeletal: No focal osseous lesions. Mild  degenerative changes of the lumbar spine with levoscoliosis. IMPRESSION: Motion degraded images. No intrahepatic ductal dilatation. Mildly dilated common duct, measuring 10 mm, unchanged from prior CT. No choledocholithiasis is seen. In the setting of abnormal LFTs, recent passage Gary Gonzalez common duct stone is possible. Given the degree of motion degradation, nonvisualized distal CBD stone or stricture is also possible, although these are not favored given the relative stability from prior CT. Cholelithiasis with very mild gallbladder wall thickening/edema. While very early acute cholecystitis is difficult to exclude, this appearance may be secondary to Gary Gonzalez right upper quadrant inflammatory process such as hepatitis. Consider follow-up right upper quadrant ultrasound or hepatobiliary nuclear medicine scan if symptoms persist. Electronically Signed   By: Julian Hy M.D.   On: 05/09/2020 12:00   DG ERCP BILIARY & PANCREATIC DUCTS  Result Date: 05/11/2020 CLINICAL DATA:  Choledocholithiasis suspected on intraoperative cholangiogram EXAM: ERCP TECHNIQUE: Multiple spot images obtained with the fluoroscopic device and submitted for interpretation post-procedure. COMPARISON:  05/10/2020 FINDINGS: Series of fluoroscopic spot images document endoscopic cannulation and opacification of the CBD with subsequent balloon catheter passage. There is incomplete opacification of the intrahepatic biliary tree, which appears decompressed centrally. Cholecystectomy clips without extravasation. IMPRESSION: Endoscopic CBD cannulation and intervention as  above. These images were submitted for radiologic interpretation only. Please see the procedural report for the amount of contrast and the fluoroscopy time utilized. Electronically Signed   By: Lucrezia Europe M.D.   On: 05/11/2020 13:23   DG Abdomen Acute W/Chest  Result Date: 05/13/2020 CLINICAL DATA:  Abdominal pain EXAM: DG ABDOMEN ACUTE WITH 1 VIEW CHEST COMPARISON:  None. FINDINGS:  There is no evidence of dilated bowel loops or free intraperitoneal air. No radiopaque calculi or other significant radiographic abnormality is seen. Heart size and mediastinal contours are within normal limits. Both lungs are clear. IMPRESSION: Negative abdominal radiographs.  No acute cardiopulmonary disease. Electronically Signed   By: Ulyses Jarred M.D.   On: 05/13/2020 21:55   MR ABDOMEN MRCP W WO CONTAST  Result Date: 05/09/2020 CLINICAL DATA:  Abdominal pain cholelithiasis, dilated CBD EXAM: MRI ABDOMEN WITHOUT AND WITH CONTRAST (INCLUDING MRCP) TECHNIQUE: Multiplanar multisequence MR imaging of the abdomen was performed both before and after the administration of intravenous contrast. Heavily T2-weighted images of the biliary and pancreatic ducts were obtained, and three-dimensional MRCP images were rendered by post processing. CONTRAST:  73mL GADAVIST GADOBUTROL 1 MMOL/ML IV SOLN COMPARISON:  Right upper quadrant ultrasound dated 05/08/2020. CT abdomen dated 02/26/2020 FINDINGS: Motion degraded images. Lower chest: Lung bases are clear. Hepatobiliary: No morphologic findings of cirrhosis. No hepatic steatosis. Mildly heterogeneous early arterial perfusion (series 22/image 37) which normalizes on later phases. No suspicious/enhancing hepatic lesions. Layering gallstones (series 16/image 23), with very mild gallbladder wall thickening/edema (series 3/image 23). No intrahepatic ductal dilatation. Mildly dilated common duct, measuring 10 mm (series 5/image 14), unchanged from prior CT. No choledocholithiasis is seen. Pancreas:  Within normal limits. Spleen:  Within normal limits. Adrenals/Urinary Tract:  Adrenal glands are within normal limits. Tiny bilateral renal cysts measuring up to 5 mm (series 5/image 18 and 22). No hydronephrosis. Stomach/Bowel: Stomach is within normal limits. Visualized bowel is grossly unremarkable. Vascular/Lymphatic:  No evidence of abdominal aortic aneurysm. No suspicious  abdominal lymphadenopathy. Other:  No abdominal ascites. Musculoskeletal: No focal osseous lesions. Mild degenerative changes of the lumbar spine with levoscoliosis. IMPRESSION: Motion degraded images. No intrahepatic ductal dilatation. Mildly dilated common duct, measuring 10 mm, unchanged from prior CT. No choledocholithiasis is seen. In the setting of abnormal LFTs, recent passage Thelton Graca common duct stone is possible. Given the degree of motion degradation, nonvisualized distal CBD stone or stricture is also possible, although these are not favored given the relative stability from prior CT. Cholelithiasis with very mild gallbladder wall thickening/edema. While very early acute cholecystitis is difficult to exclude, this appearance may be secondary to Jaydi Bray right upper quadrant inflammatory process such as hepatitis. Consider follow-up right upper quadrant ultrasound or hepatobiliary nuclear medicine scan if symptoms persist. Electronically Signed   By: Julian Hy M.D.   On: 05/09/2020 12:00   VAS [place] LOWER EXTREMITY VENOUS (DVT)  Result Date: 05/16/2020  Lower Venous DVT Study Indications: D-dimer, Covid-19.  Risk Factors: Surgery 05-10-2020 Laprascopic cholecystectomy. Anticoagulation: Lovenox. Comparison Study: No prior studies. Performing Technologist: Darlin Coco RDMS  Examination Guidelines: [place] complete evaluation includes B-mode imaging, spectral Doppler, color Doppler, and power Doppler as needed of all accessible portions of each vessel. Bilateral testing is considered an integral part of [place] complete examination. Limited examinations for reoccurring indications may be performed as noted. The reflux portion of the exam is performed with the patient in reverse Trendelenburg.  +---------+---------------+---------+-----------+----------+--------------+ RIGHT    CompressibilityPhasicitySpontaneityPropertiesThrombus Aging +---------+---------------+---------+-----------+----------+--------------+ CFV  Full           Yes      Yes                                 +---------+---------------+---------+-----------+----------+--------------+ SFJ      Full                                                        +---------+---------------+---------+-----------+----------+--------------+ FV Prox  Full                                                        +---------+---------------+---------+-----------+----------+--------------+ FV Mid   Full                                                        +---------+---------------+---------+-----------+----------+--------------+ FV DistalFull                                                        +---------+---------------+---------+-----------+----------+--------------+ PFV      Full                                                        +---------+---------------+---------+-----------+----------+--------------+ POP      Full           Yes      Yes                                 +---------+---------------+---------+-----------+----------+--------------+ PTV      Full                                                        +---------+---------------+---------+-----------+----------+--------------+ PERO     Full                                                        +---------+---------------+---------+-----------+----------+--------------+   +---------+---------------+---------+-----------+----------+--------------+ LEFT     CompressibilityPhasicitySpontaneityPropertiesThrombus Aging +---------+---------------+---------+-----------+----------+--------------+ CFV      Full           Yes      Yes                                 +---------+---------------+---------+-----------+----------+--------------+  SFJ      Full                                                        +---------+---------------+---------+-----------+----------+--------------+ FV Prox  Full                                                         +---------+---------------+---------+-----------+----------+--------------+ FV Mid   Full                                                        +---------+---------------+---------+-----------+----------+--------------+ FV DistalFull                                                        +---------+---------------+---------+-----------+----------+--------------+ PFV      Full                                                        +---------+---------------+---------+-----------+----------+--------------+ POP      Full           Yes      Yes                                 +---------+---------------+---------+-----------+----------+--------------+ PTV      Full                                                        +---------+---------------+---------+-----------+----------+--------------+ PERO     Full                                                        +---------+---------------+---------+-----------+----------+--------------+     Summary: RIGHT: - There is no evidence of deep vein thrombosis in the lower extremity.  - No cystic structure found in the popliteal fossa.  LEFT: - There is no evidence of deep vein thrombosis in the lower extremity.  - Gary Gonzalez cystic structure is found in the popliteal fossa measuring 2.8 cm.  *See table(s) above for measurements and observations. Electronically signed by Ruta Hinds MD on 05/16/2020 at 5:19:00 PM.    Final    US Abdomen Limited RUQ (LIVER/GB)  Result Date: 05/08/2020 CLINICAL DATA:  Right upper quadrant pain EXAM: ULTRASOUND ABDOMEN LIMITED RIGHT UPPER QUADRANT COMPARISON:  None. FINDINGS: Gallbladder:  Multiple stones are seen in the gallbladder. No wall thickening, pericholecystic fluid, or Murphy's sign. Common bile duct: Diameter: 6 mm Liver: No focal lesion identified. Within normal limits in parenchymal echogenicity. Portal vein is patent on color Doppler imaging with normal direction of blood flow  towards the liver. Other: None. IMPRESSION: 1. Cholelithiasis without wall thickening, pericholecystic fluid, or Murphy's sign. If the clinical picture remains ambiguous, recommend HIDA scan. 2. The common bile duct is borderline measuring 6.3 mm. Recommend correlation with clinical picture and labs. If there is concern for biliary obstruction, recommend MRCP or ERCP. Electronically Signed   By: Dorise Bullion III M.D   On: 05/08/2020 16:30    Microbiology: Recent Results (from the past 240 hour(s))  SARS CORONAVIRUS 2 (TAT 6-24 HRS) Nasopharyngeal Nasopharyngeal Swab     Status: None   Collection Time: 05/08/20  5:27 PM   Specimen: Nasopharyngeal Swab  Result Value Ref Range Status   SARS Coronavirus 2 NEGATIVE NEGATIVE Final    Comment: (NOTE) SARS-CoV-2 target nucleic acids are NOT DETECTED.  The SARS-CoV-2 RNA is generally detectable in upper and lower respiratory specimens during the acute phase of infection. Negative results do not preclude SARS-CoV-2 infection, do not rule out co-infections with other pathogens, and should not be used as the sole basis for treatment or other patient management decisions. Negative results must be combined with clinical observations, patient history, and epidemiological information. The expected result is Negative.  Fact Sheet for Patients: SugarRoll.be  Fact Sheet for Healthcare Providers: https://www.woods-mathews.com/  This test is not yet approved or cleared by the [place] FDA and  has been authorized for detection and/or diagnosis of SARS-CoV-2 by FDA under an Emergency Use Authorization (EUA). This EUA will remain  in effect (meaning this test can be used) for the duration of the COVID-19 declaration under Se ction 564(b)(1) of the Act, 21 U.S.C. section 360bbb-3(b)(1), unless the authorization is terminated or revoked sooner.  Performed at [facility], [place] [address],  [place], [place] [address]   Surgical pcr screen     Status: None   Collection Time: 05/09/20  4:53 AM   Specimen: Nasal Mucosa; Nasal Swab  Result Value Ref Range Status   MRSA, PCR NEGATIVE NEGATIVE Final   Staphylococcus aureus NEGATIVE NEGATIVE Final    Comment: (NOTE) The Xpert SA Assay (FDA approved for NASAL specimens in patients 30 years of age and older), is one component of Gary Gonzalez comprehensive surveillance program. It is not intended to diagnose infection nor to guide or monitor treatment. Performed at [facility], [place] [address] [place], [place] [address]   Blood culture (routine x 2)     Status: None (Preliminary result)   Collection Time: 05/13/20  8:45 PM   Specimen: BLOOD LEFT FOREARM  Result Value Ref Range Status   Specimen Description   Final    BLOOD LEFT FOREARM Performed at [place] [address], [place], [place] [address]    Special Requests   Final    BOTTLES DRAWN AEROBIC AND ANAEROBIC Blood Culture results may not be optimal due to an excessive volume of blood received in culture bottles Performed at [place] [address], [place], [place] [address]    Culture   Final    NO GROWTH 2 DAYS Performed at [place] [address], [place],  [address]    Report Status PENDING  Incomplete  Urine Culture  Status: None   Collection Time: 05/13/20  8:45 PM   Specimen: Urine, Random  Result Value Ref Range Status   Specimen Description   Final    URINE, RANDOM Performed at [place] [address], [place], [place] [address]    Special Requests   Final    NONE Performed at [facility], [place] [address] [place], [place] [address]    Culture   Final    NO GROWTH Performed at [facility], [place] [address] [place], [place] [address]    Report Status 05/15/2020 FINAL  Final  Blood culture (routine x 2)     Status: None  (Preliminary result)   Collection Time: 05/13/20  8:50 PM   Specimen: Right Antecubital; Blood  Result Value Ref Range Status   Specimen Description   Final    RIGHT ANTECUBITAL Performed at [place] [address] [place], [place] [address]    Special Requests   Final    BOTTLES DRAWN AEROBIC AND ANAEROBIC Blood Culture results may not be optimal due to an excessive volume of blood received in culture bottles Performed at [place] [address] [place], [place] [address]    Culture   Final    NO GROWTH 2 DAYS Performed at [place] [address], [place], [place] [address]    Report Status PENDING  Incomplete  Resp Panel by RT-PCR (Flu Gary Gonzalez&B, Covid)     Status: Abnormal   Collection Time: 05/14/20 12:51 AM   Specimen: Nasopharyngeal(NP) swabs in vial transport medium  Result Value Ref Range Status   SARS Coronavirus 2 by RT PCR POSITIVE (Gary Gonzalez) NEGATIVE Final    Comment: CRITICAL RESULT CALLED TO, READ BACK BY AND VERIFIED WITH: JESSE LAUBERMILK TN 05/14/20 @0238  BY P.HENDERSON (NOTE) SARS-CoV-2 target nucleic acids are DETECTED.  The SARS-CoV-2 RNA is generally detectable in upper respiratory specimens during the acute phase of infection. Positive results are indicative of the presence of the identified virus, but do not rule out bacterial infection or co-infection with other pathogens not detected by the test. Clinical correlation with patient history and other diagnostic information is necessary to determine patient infection status. The expected result is Negative.  Fact Sheet for Patients: EntrepreneurPulse.com.au  Fact Sheet for Healthcare Providers: IncredibleEmployment.be  This test is not yet approved or cleared by the [place] FDA and  has been authorized for detection and/or diagnosis of SARS-CoV-2 by FDA under an Emergency Use Authorization (EUA).  This EUA  will remain in effect  (meaning this test can be used) for the duration of  the COVID-19 declaration under Section 564(b)(1) of the Act, 21 U.S.C. section 360bbb-3(b)(1), unless the authorization is terminated or revoked sooner.     Influenza Gary Gonzalez by PCR NEGATIVE NEGATIVE Final   Influenza B by PCR NEGATIVE NEGATIVE Final    Comment: (NOTE) The Xpert Xpress SARS-CoV-2/FLU/RSV plus assay is intended as an aid in the diagnosis of influenza from Nasopharyngeal swab specimens and should not be used as Zedekiah Hinderman sole basis for treatment. Nasal washings and aspirates are unacceptable for Xpert Xpress SARS-CoV-2/FLU/RSV testing.  Fact Sheet for Patients: EntrepreneurPulse.com.au  Fact Sheet for Healthcare Providers: IncredibleEmployment.be  This test is not yet approved or cleared by the [place] FDA and has been authorized for detection and/or diagnosis of SARS-CoV-2 by FDA under an Emergency Use Authorization (EUA). This EUA will remain in effect (meaning this test can be used)  for the duration of the COVID-19 declaration under Section 564(b)(1) of the Act, 21 U.S.C. section 360bbb-3(b)(1), unless the authorization is terminated or revoked.  Performed at [facility], [place] [address], [place], [place] [address]      Labs: Basic Metabolic Panel: Recent Labs  Lab 05/12/20 0353 05/13/20 2044 05/14/20 0652 05/15/20 0504 05/16/20 0447  NA 139 141 142 141 143  K 3.5 3.2* 3.1* 3.3* 3.4*  CL 106 105 112* 108 111  CO2 23 23 22  21* 21*  GLUCOSE 158* 107* 79 81 91  BUN 19 21 18 19 19   CREATININE 1.20 1.19 1.10 1.22 1.23  CALCIUM 7.8* 8.0* 7.3* 7.5* 7.6*  MG  --   --  1.5*  --  2.0  PHOS  --   --   --   --  2.2*   Liver Function Tests: Recent Labs  Lab 05/12/20 0353 05/13/20 2044 05/14/20 0652 05/15/20 0504 05/16/20 0447  AST 67* 42* 30 27 26   ALT 146* 98* 74* 59* 48*  ALKPHOS 229* 196* 163* 140* 124  BILITOT 0.6 1.0 0.6  0.7 0.6  PROT 5.8* 6.0* 5.2* 5.0* 4.9*  ALBUMIN 2.6* 2.7* 2.1* 2.2* 2.2*   Recent Labs  Lab 05/10/20 0220 05/13/20 2044  LIPASE 217* 45   No results for input(s): AMMONIA in the last 168 hours. CBC: Recent Labs  Lab 05/12/20 0353 05/13/20 2044 05/14/20 0652 05/15/20 0504 05/16/20 0447  WBC 11.1* 15.7* 12.1* 8.6 7.5  NEUTROABS 9.9* 13.9*  --   --   --   HGB 9.0* 10.0* 8.5* 8.6* 8.5*  HCT 28.2* 31.1* 26.4* 27.2* 26.7*  MCV 93.7 93.7 93.0 94.8 93.4  PLT 248 350 312 317 369   Cardiac Enzymes: No results for input(s): CKTOTAL, CKMB, CKMBINDEX, TROPONINI in the last 168 hours. BNP: BNP (last 3 results) No results for input(s): BNP in the last 8760 hours.  ProBNP (last 3 results) No results for input(s): PROBNP in the last 8760 hours.  CBG: Recent Labs  Lab 05/09/20 2135  GLUCAP 117*       Signed:  Fayrene Helper MD.  [facility] 05/16/2020, 5:31 PM

## 2020-05-16 NOTE — TOC Progression Note (Signed)
Transition of Care [facility]) - Progression Note    Patient Details  Name: Gary Gonzalez MRN: [id number] Date of Birth: 12/21/1926  [facility] of [facility]) CM/SW Contact  Purcell Mouton, RN Phone Number: 05/16/2020, 3:17 PM  Clinical Narrative:     Pt plan to discharge home with spouse and family. NO PT follow-up. TOC will continue to follow for HH/DME needs.  Expected Discharge Plan: Home/Self Care Barriers to Discharge: No Barriers Identified  Expected Discharge Plan and Services Expected Discharge Plan: Home/Self Care       Living arrangements for the past 2 months: Single Family Home                                       Social Determinants of Health (SDOH) Interventions    Readmission Risk Interventions No flowsheet data found.

## 2020-05-16 NOTE — Progress Notes (Signed)
Pt loss IV access during Zosyn administration.  IV team came to start new IV.  At this point IV Zosyn was continued.  Will consult Pharmacy on what to do about 0600 dose.

## 2020-05-16 NOTE — Discharge Instructions (Signed)
Diverticulitis  Diverticulitis is when small pouches in your colon (large intestine) get infected or swollen. This causes pain in the belly (abdomen) and watery poop (diarrhea). These pouches are called diverticula. The pouches form in people who have Milledge Gerding condition called diverticulosis. What are the causes? This condition may be caused by poop (stool) that gets trapped in the pouches in your colon. The poop lets germs (bacteria) grow in the pouches. This causes the infection. What increases the risk? You are more likely to get this condition if you have small pouches in your colon. The risk is higher if:  You are overweight or very overweight (obese).  You do not exercise enough.  You drink alcohol.  You smoke or use products with tobacco in them.  You eat Ray Gervasi diet that has Morrill Bomkamp lot of red meat such as beef, pork, or lamb.  You eat Landan Fedie diet that does not have enough fiber in it.  You are older than 85 years of age. What are the signs or symptoms?  Pain in the belly. Pain is often on the left side, but it may be in other areas.  Fever and feeling cold.  Feeling like you may vomit.  Vomiting.  Having cramps.  Feeling full.  Changes to how often you poop.  Blood in your poop. How is this treated? Most cases are treated at home by:  Taking over-the-counter pain medicines.  Following Susi Goslin clear liquid diet.  Taking antibiotic medicines.  Resting. Very bad cases may need to be treated at [facility] Gr[facility]in hospital. This may include:  Not eating or drinking.  Taking prescription pain medicine.  Getting antibiotic medicines through an IV tube.  Getting fluid and food through an IV tube.  Having surgery. When you are feeling better, your doctor may tell you to have Keniesha Adderly test to check your colon (colonoscopy). Follow these instructions at home: Medicines  Take over-the-counter and prescription medicines only as told by your doctor. These include: ? Antibiotics. ? Pain medicines. ? Fiber  pills. ? Probiotics. ? Stool softeners.  If you were prescribed an antibiotic medicine, take it as told by your doctor. Do not stop taking the antibiotic even if you start to feel better.  Ask your doctor if the medicine prescribed to you requires you to avoid driving or using machinery. Eating and drinking  Follow Kyra Laffey diet as told by your doctor.  When you feel better, your doctor may tell you to change your diet. You may need to eat Simmie Camerer lot of fiber. Fiber makes it easier to poop (have Kristyna Bradstreet bowel movement). Foods with fiber include: ? Berries. ? Beans. ? Lentils. ? Green vegetables.  Avoid eating red meat.   General instructions  Do not use any products that contain nicotine or tobacco, such as cigarettes, e-cigarettes, and chewing tobacco. If you need help quitting, ask your doctor.  Exercise 3 or more times Samon Dishner week. Try to get 30 minutes each time. Exercise enough to sweat and make your heart beat faster.  Keep all follow-up visits as told by your doctor. This is important. Contact Karolyna Bianchini doctor if:  Your pain does not get better.  You are not pooping like normal. Get help right away if:  Your pain gets worse.  Your symptoms do not get better.  Your symptoms get worse very fast.  You have Nana Hoselton fever.  You vomit more than one time.  You have poop that is: ? Bloody. ? Black. ? Tarry. Summary  This condition happens when   small pouches in your colon get infected or swollen.  Take medicines only as told by your doctor.  Follow Shahzad Thomann diet as told by your doctor.  Keep all follow-up visits as told by your doctor. This is important. This information is not intended to replace advice given to you by your health care provider. Make sure you discuss any questions you have with your health care provider. Document Revised: 01/26/2019 Document Reviewed: 01/26/2019 Elsevier Patient Education  2021 Elsevier Inc.  

## 2020-05-16 NOTE — Discharge Summary (Signed)
Patient ID: Gary Gonzalez MRN: [id number] DOB/AGE: [birth year] 85 y.o.  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 Coralie Keens  Endoscopic retrograde cholangiopancreatography (ERCP); with removal of calculi/debris from biliary/pancreatic duct(s),sphincterotomy/papillotomy 05/11/2020 Dr. Altamese Dilling [facility] Course:  Patient presents emergency room with a 1 day history of severe epigastric and right upper quadrant pain. He has been seen by Dr. Zenia Resides at [facility] surgery and evaluated for gallstones. He was cleared by his cardiologist Dr. [name] to proceed with laparoscopic cholecystectomy hopefully sometime next month. Unfortunately, he developed sharp right upper quadrant pain with nausea and vomiting which lasted for the last 18 hours. It is not improved. He was brought in by EMS and evaluated. He is more comfortable now after pain medication. Of note he does have significantly elevated liver function studies with his AST and ALT of being over 400, his total bilirubin to be 3.7 and his alk phos of 440. Ultrasound shows gallstones without obvious gallbladder wall thickening but the common bile duct is 6.3 mm which is mildly enlarged.  Patient was seen and admitted by Dr. Brantley Stage and underwent laparoscopic cholecystectomy with intraoperative cholangiogram by Dr. Ninfa Linden on 05/10/2020.  He was found to have an acutely inflamed gallbladder with gallstones  cholangiogram was performed the bile duct and pancreatic duct were dilated.  Contrast flowed easily into the duodenum was difficult to tell whether there could be a distal stone versus an air bubble.  He returned to the floor and was seen in consultation by Dr. Clarene Essex.  He was scheduled and underwent ERCP, with removal of stones and sphincterotomy.  He tolerated both procedures well.  LFTs are returning to normal.  He was tolerating diet mobilizing well.  He was ready for discharge in the afternoon of 05/12/2020.  Follow-up as listed below.  Condition on discharge: Improved  CBC Latest Ref Rng & Units 05/12/2020 05/11/2020 05/10/2020  WBC 4.0 - 10.5 K/uL 11.1(H) 11.5(H) 11.6(H)  Hemoglobin 13.0 - 17.0 g/dL 9.0(L) 9.6(L) 9.8(L)  Hematocrit 39.0 - 52.0 % 28.2(L) 30.3(L) 30.4(L)  Platelets 150 - 400 K/uL 248 256 260   CMP Latest Ref Rng & Units 05/12/2020 05/11/2020 05/11/2020  Glucose 70 - 99 mg/dL 158(H) 199(H) 105(H)  BUN 8 - 23 mg/dL _0 Creatinine 0.61 - 1.24 mg/dL 1.20 1.25(H) 1.13  Sodium 135 - 145 mmol/L 139 136 140  Potassium 3.5 - 5.1 mmol/L 3.5 3.6 3.4(L)  Chloride 98 - 111 mmol/L 106 106 107  CO2 22 - 32 mmol/L 23 21(L) 24  Calcium 8.9 - 10.3 mg/dL 7.8(L) 7.6(L) 8.0(L)  Total Protein 6.5 - 8.1 g/dL 5.8(L) 5.5(L) 6.1(L)  Total Bilirubin 0.3 - 1.2 mg/dL 0.6 0.7 0.8  Alkaline Phos 38 - 126 U/L 229(H) 205(H) 265(H)  AST 15 - 41 U/L 67(H) 75(H) 116(H)  ALT 0 - 44 U/L 146(H) 148(H) 210(H)    Disposition: Discharge disposition: 01-Home or Self Care            Allergies as of 05/12/2020      Reactions   Atenolol Diarrhea, Other (See Comments)   Very  weak, fatigued, lethargic, no appetite      Medication List    TAKE these medications   aspirin 81 MG tablet Take 81 mg by mouth daily.   ezetimibe 10 MG tablet Commonly known as: ZETIA Take 10 mg by mouth daily.   isosorbide mononitrate 30 MG 24 hr tablet Commonly known as: IMDUR Take 30 mg by  mouth daily.   isosorbide mononitrate 30 MG 24 hr tablet Commonly known as: IMDUR Take 1 tablet (30 mg total) by mouth daily.   levothyroxine 125 MCG tablet Commonly known as: SYNTHROID Take 1 tablet by mouth daily before breakfast.   mirtazapine 15 MG tablet Commonly known as: REMERON Take 15 mg by mouth at bedtime.   nitroGLYCERIN 0.4 MG SL tablet Commonly known as: NITROSTAT Place 0.4 mg under the tongue every 5 (five) minutes as needed for chest pain.   nitroGLYCERIN 0.4 MG SL tablet Commonly known as: NITROSTAT Place 1 tablet (0.4 mg total) under the tongue every 5 (five) minutes as needed for chest pain.   omeprazole 20 MG capsule Commonly known as: PRILOSEC Take 20 mg by mouth daily.   Repatha SureClick 140 MG/ML Soaj Generic drug: Evolocumab Inject 140 mg into the skin every 28 (twenty-eight) days.   traMADol 50 MG tablet Commonly known as: Ultram Take 1 tablet (50 mg total) by mouth every 6 (six) hours as needed for up to 5 days (pain not controlled with tylenol).           Follow-up Information        Surgery, [place]. Go on 06/02/2020.   Specialty: General Surgery Why: @130pm. Please arrive 30 minutes prior to your appointment for paperwork. Please bring a copy of your photo ID and insurance card.  Contact information: [address] [place] [address] [phone]                Signed: [name],[name] 05/12/2020, 3:30 PM          Cosigned by: Blackman, Douglas, MD at 05/12/2020 3:44 PM    

## 2020-05-16 NOTE — Progress Notes (Signed)
Patient provided with discharge information and instructions. All questions and concerns denied. Family picking patient up and he will follow up outpatient.

## 2020-05-19 DIAGNOSIS — I25119 Atherosclerotic heart disease of native coronary artery with unspecified angina pectoris: Secondary | ICD-10-CM | POA: Diagnosis not present

## 2020-05-19 DIAGNOSIS — N1831 Chronic kidney disease, stage 3a: Secondary | ICD-10-CM | POA: Diagnosis not present

## 2020-05-19 DIAGNOSIS — U071 COVID-19: Secondary | ICD-10-CM | POA: Diagnosis not present

## 2020-05-19 DIAGNOSIS — K8070 Calculus of gallbladder and bile duct without cholecystitis without obstruction: Secondary | ICD-10-CM | POA: Diagnosis not present

## 2020-05-19 DIAGNOSIS — D509 Iron deficiency anemia, unspecified: Secondary | ICD-10-CM | POA: Diagnosis not present

## 2020-05-19 DIAGNOSIS — K5792 Diverticulitis of intestine, part unspecified, without perforation or abscess without bleeding: Secondary | ICD-10-CM | POA: Diagnosis not present

## 2020-05-19 DIAGNOSIS — R197 Diarrhea, unspecified: Secondary | ICD-10-CM | POA: Diagnosis not present

## 2020-05-19 DIAGNOSIS — E871 Hypo-osmolality and hyponatremia: Secondary | ICD-10-CM | POA: Diagnosis not present

## 2020-05-19 DIAGNOSIS — I509 Heart failure, unspecified: Secondary | ICD-10-CM | POA: Diagnosis not present

## 2020-05-19 DIAGNOSIS — I130 Hypertensive heart and chronic kidney disease with heart failure and stage 1 through stage 4 chronic kidney disease, or unspecified chronic kidney disease: Secondary | ICD-10-CM | POA: Diagnosis not present

## 2020-05-19 LAB — CULTURE, BLOOD (ROUTINE X 2)
Culture: NO GROWTH
Culture: NO GROWTH

## 2020-05-26 ENCOUNTER — Emergency Department (HOSPITAL_COMMUNITY): Payer: PPO

## 2020-05-26 ENCOUNTER — Other Ambulatory Visit: Payer: Self-pay

## 2020-05-26 ENCOUNTER — Encounter (HOSPITAL_COMMUNITY): Payer: Self-pay

## 2020-05-26 ENCOUNTER — Inpatient Hospital Stay (HOSPITAL_COMMUNITY)
Admission: EM | Admit: 2020-05-26 | Discharge: 2020-06-03 | DRG: 372 | Disposition: A | Discharge status: Home Health | Payer: PPO | Attending: Internal Medicine | Admitting: Internal Medicine

## 2020-05-26 DIAGNOSIS — I2699 Other pulmonary embolism without acute cor pulmonale: Secondary | ICD-10-CM | POA: Diagnosis not present

## 2020-05-26 DIAGNOSIS — E785 Hyperlipidemia, unspecified: Secondary | ICD-10-CM | POA: Diagnosis present

## 2020-05-26 DIAGNOSIS — D72829 Elevated white blood cell count, unspecified: Secondary | ICD-10-CM | POA: Diagnosis not present

## 2020-05-26 DIAGNOSIS — L89159 Pressure ulcer of sacral region, unspecified stage: Secondary | ICD-10-CM | POA: Diagnosis not present

## 2020-05-26 DIAGNOSIS — R262 Difficulty in walking, not elsewhere classified: Secondary | ICD-10-CM

## 2020-05-26 DIAGNOSIS — J90 Pleural effusion, not elsewhere classified: Secondary | ICD-10-CM | POA: Diagnosis not present

## 2020-05-26 DIAGNOSIS — J479 Bronchiectasis, uncomplicated: Secondary | ICD-10-CM | POA: Diagnosis present

## 2020-05-26 DIAGNOSIS — N179 Acute kidney failure, unspecified: Secondary | ICD-10-CM | POA: Diagnosis not present

## 2020-05-26 DIAGNOSIS — Z85820 Personal history of malignant melanoma of skin: Secondary | ICD-10-CM | POA: Diagnosis not present

## 2020-05-26 DIAGNOSIS — R4182 Altered mental status, unspecified: Secondary | ICD-10-CM | POA: Diagnosis not present

## 2020-05-26 DIAGNOSIS — K5792 Diverticulitis of intestine, part unspecified, without perforation or abscess without bleeding: Secondary | ICD-10-CM

## 2020-05-26 DIAGNOSIS — D6489 Other specified anemias: Secondary | ICD-10-CM | POA: Diagnosis present

## 2020-05-26 DIAGNOSIS — I5042 Chronic combined systolic (congestive) and diastolic (congestive) heart failure: Secondary | ICD-10-CM | POA: Diagnosis present

## 2020-05-26 DIAGNOSIS — J439 Emphysema, unspecified: Secondary | ICD-10-CM | POA: Diagnosis not present

## 2020-05-26 DIAGNOSIS — Z79899 Other long term (current) drug therapy: Secondary | ICD-10-CM | POA: Diagnosis not present

## 2020-05-26 DIAGNOSIS — A0472 Enterocolitis due to Clostridium difficile, not specified as recurrent: Principal | ICD-10-CM

## 2020-05-26 DIAGNOSIS — D72828 Other elevated white blood cell count: Secondary | ICD-10-CM

## 2020-05-26 DIAGNOSIS — R531 Weakness: Secondary | ICD-10-CM

## 2020-05-26 DIAGNOSIS — Z7982 Long term (current) use of aspirin: Secondary | ICD-10-CM

## 2020-05-26 DIAGNOSIS — N183 Chronic kidney disease, stage 3 unspecified: Secondary | ICD-10-CM | POA: Diagnosis present

## 2020-05-26 DIAGNOSIS — R152 Fecal urgency: Secondary | ICD-10-CM | POA: Diagnosis present

## 2020-05-26 DIAGNOSIS — I429 Cardiomyopathy, unspecified: Secondary | ICD-10-CM | POA: Diagnosis present

## 2020-05-26 DIAGNOSIS — Z79890 Hormone replacement therapy (postmenopausal): Secondary | ICD-10-CM

## 2020-05-26 DIAGNOSIS — I509 Heart failure, unspecified: Secondary | ICD-10-CM | POA: Diagnosis not present

## 2020-05-26 DIAGNOSIS — M353 Polymyalgia rheumatica: Secondary | ICD-10-CM | POA: Diagnosis not present

## 2020-05-26 DIAGNOSIS — Z951 Presence of aortocoronary bypass graft: Secondary | ICD-10-CM | POA: Diagnosis not present

## 2020-05-26 DIAGNOSIS — E860 Dehydration: Secondary | ICD-10-CM | POA: Diagnosis present

## 2020-05-26 DIAGNOSIS — E876 Hypokalemia: Secondary | ICD-10-CM | POA: Diagnosis present

## 2020-05-26 DIAGNOSIS — E039 Hypothyroidism, unspecified: Secondary | ICD-10-CM | POA: Diagnosis present

## 2020-05-26 DIAGNOSIS — R9431 Abnormal electrocardiogram [ECG] [EKG]: Secondary | ICD-10-CM | POA: Diagnosis not present

## 2020-05-26 DIAGNOSIS — M797 Fibromyalgia: Secondary | ICD-10-CM | POA: Diagnosis not present

## 2020-05-26 DIAGNOSIS — I2510 Atherosclerotic heart disease of native coronary artery without angina pectoris: Secondary | ICD-10-CM | POA: Diagnosis present

## 2020-05-26 DIAGNOSIS — J8410 Pulmonary fibrosis, unspecified: Secondary | ICD-10-CM | POA: Diagnosis not present

## 2020-05-26 DIAGNOSIS — J9811 Atelectasis: Secondary | ICD-10-CM | POA: Diagnosis not present

## 2020-05-26 DIAGNOSIS — I248 Other forms of acute ischemic heart disease: Secondary | ICD-10-CM | POA: Diagnosis present

## 2020-05-26 DIAGNOSIS — R0689 Other abnormalities of breathing: Secondary | ICD-10-CM | POA: Diagnosis not present

## 2020-05-26 DIAGNOSIS — Z9049 Acquired absence of other specified parts of digestive tract: Secondary | ICD-10-CM | POA: Diagnosis not present

## 2020-05-26 DIAGNOSIS — Z8616 Personal history of COVID-19: Secondary | ICD-10-CM

## 2020-05-26 DIAGNOSIS — I493 Ventricular premature depolarization: Secondary | ICD-10-CM | POA: Diagnosis present

## 2020-05-26 DIAGNOSIS — U071 COVID-19: Secondary | ICD-10-CM

## 2020-05-26 DIAGNOSIS — Z9889 Other specified postprocedural states: Secondary | ICD-10-CM | POA: Diagnosis not present

## 2020-05-26 DIAGNOSIS — R7989 Other specified abnormal findings of blood chemistry: Secondary | ICD-10-CM | POA: Diagnosis not present

## 2020-05-26 DIAGNOSIS — R109 Unspecified abdominal pain: Secondary | ICD-10-CM | POA: Diagnosis not present

## 2020-05-26 LAB — TROPONIN I (HIGH SENSITIVITY)
Troponin I (High Sensitivity): 21 ng/L — ABNORMAL HIGH (ref <18)
Troponin I (High Sensitivity): 21 ng/L — ABNORMAL HIGH (ref <18)

## 2020-05-26 LAB — COMPREHENSIVE METABOLIC PANEL
ALT: 22 U/L (ref 0–44)
AST: 24 U/L (ref 15–41)
Albumin: 2.8 g/dL — ABNORMAL LOW (ref 3.5–5.0)
Alkaline Phosphatase: 115 U/L (ref 38–126)
Anion gap: 12 (ref 5–15)
BUN: 20 mg/dL (ref 8–23)
CO2: 25 mmol/L (ref 22–32)
Calcium: 8.3 mg/dL — ABNORMAL LOW (ref 8.9–10.3)
Chloride: 99 mmol/L (ref 98–111)
Creatinine, Ser: 1.2 mg/dL (ref 0.61–1.24)
GFR, Estimated: 57 mL/min — ABNORMAL LOW (ref >60)
Glucose, Bld: 128 mg/dL — ABNORMAL HIGH (ref 70–99)
Potassium: 4 mmol/L (ref 3.5–5.1)
Sodium: 136 mmol/L (ref 135–145)
Total Bilirubin: 0.5 mg/dL (ref 0.3–1.2)
Total Protein: 6.5 g/dL (ref 6.5–8.1)

## 2020-05-26 LAB — CBC
HCT: 31.4 % — ABNORMAL LOW (ref 39.0–52.0)
Hemoglobin: 10.1 g/dL — ABNORMAL LOW (ref 13.0–17.0)
MCH: 29.7 pg (ref 26.0–34.0)
MCHC: 32.2 g/dL (ref 30.0–36.0)
MCV: 92.4 fL (ref 80.0–100.0)
Platelets: 387 10*3/uL (ref 150–400)
RBC: 3.4 MIL/uL — ABNORMAL LOW (ref 4.22–5.81)
RDW: 13.4 % (ref 11.5–15.5)
WBC: 19.2 10*3/uL — ABNORMAL HIGH (ref 4.0–10.5)
nRBC: 0 % (ref 0.0–0.2)

## 2020-05-26 LAB — TSH: TSH: 0.546 u[IU]/mL (ref 0.350–4.500)

## 2020-05-26 MED ORDER — MORPHINE SULFATE (PF) 2 MG/ML IV SOLN
0.5000 mg | Freq: Once | INTRAVENOUS | Status: AC
  Administered 2020-05-26: 0.5 mg via INTRAVENOUS
  Filled 2020-05-26: qty 1

## 2020-05-26 MED ORDER — PIPERACILLIN-TAZOBACTAM 3.375 G IVPB 30 MIN
3.3750 g | Freq: Once | INTRAVENOUS | Status: AC
  Administered 2020-05-26: 3.375 g via INTRAVENOUS
  Filled 2020-05-26: qty 50

## 2020-05-26 MED ORDER — IOHEXOL 300 MG/ML  SOLN
100.0000 mL | Freq: Once | INTRAMUSCULAR | Status: AC | PRN
  Administered 2020-05-26: 100 mL via INTRAVENOUS

## 2020-05-26 MED ORDER — ACETAMINOPHEN 500 MG PO TABS
1000.0000 mg | ORAL_TABLET | Freq: Once | ORAL | Status: DC
  Filled 2020-05-26: qty 2

## 2020-05-26 MED ORDER — ONDANSETRON HCL 4 MG/2ML IJ SOLN
4.0000 mg | Freq: Once | INTRAMUSCULAR | Status: AC
  Administered 2020-05-26: 4 mg via INTRAVENOUS
  Filled 2020-05-26: qty 2

## 2020-05-26 MED ORDER — SODIUM CHLORIDE 0.9 % IV BOLUS
1000.0000 mL | Freq: Once | INTRAVENOUS | Status: AC
  Administered 2020-05-26: 1000 mL via INTRAVENOUS

## 2020-05-26 NOTE — ED Notes (Signed)
Fall Bundle: Yellow socks Yellow fall risk armband Bed alarm on Fall risk sign outside door

## 2020-05-26 NOTE — ED Notes (Signed)
External condom catheter placed on patient  

## 2020-05-26 NOTE — ED Notes (Signed)
Family at bedside. 

## 2020-05-26 NOTE — ED Notes (Signed)
Patient transported to CT 

## 2020-05-26 NOTE — ED Triage Notes (Signed)
C/o general weakness has repeat visits to this hospital. Finished antibiotics yesterday and today feels an increase in weakness. Covid positive > 10 days

## 2020-05-26 NOTE — ED Provider Notes (Addendum)
Gary Gonzalez Provider Note   CSN: [id number] Arrival date & time: 05/26/20  1556     History Chief Complaint  Patient presents with  . Weakness    Gary Gonzalez is a 87 y.o. male.  Patient with hx cholecytectomy ~ 2 weeks ago, and subsequent re-admission with diverticulitis and covid, presents via EMS with generalized weakness. Symptoms gradual onset when recently in hospital, constant, mod-severe, persistent, slowly worse, and especially worse in past day. Today states too weak to get up and walk - lives w spouse, and daughter was there to help him get up to bathroom today. Decreased appetite and poor po intake. No vomiting or diarrhea. No abd pain. Denies syncope, trauma or fall. No headache. No neck or back pain. No chest pain or discomfort. Occasional non prod cough. No sob. No dysuria or gu c/o. No new meds, except notes recently completed course of antibiotics. Denies fever or chills.   The history is provided by the patient and the EMS personnel.  Weakness Associated symptoms: cough   Associated symptoms: no abdominal pain, no chest pain, no diarrhea, no dysuria, no fever, no headaches, no shortness of breath and no vomiting        Past Medical History:  Diagnosis Date  . Anemia   . Arthritis    "touch in the fingers" (11/17/2014)  . Chronic combined systolic and diastolic CHF (congestive heart failure) ([place])    a. 11/17/14 showed EF 25-30%, suboptimal image quality, diffuse hypokinesis worse in the inferior wall, mild to moderate MR. New drop EF compared to 2012 but cath was stable.  . CKD (chronic kidney disease), stage III ([place])   . Coronary artery disease    a. s/p 2v CABG (seq LIMA to Diag and LAD, SVG to OM) on 05/15/2001.  . Fibromyalgia    "a long time ago" (11/17/2014)  . History of gout   . Hyperlipidemia   . Hypothyroidism   . LBBB (left bundle branch block)   . Melanoma of back ([place])   . NSVT (nonsustained ventricular  tachycardia) ([place])   . PMR (polymyalgia rheumatica) ([facility])   . PVC's (premature ventricular contractions)   . Sleep apnea    "wife says I do" (11/18/2014)    Patient Active Problem List   Diagnosis Date Noted  . Acute diverticulitis 05/14/2020  . Hypokalemia 05/14/2020  . Diverticulitis   . S/P laparoscopic cholecystectomy   . Acute cholecystitis 05/08/2020  . Hypothyroidism (acquired) 10/29/2016  . Depression 10/29/2016  . Chest pain 10/29/2016  . Elevated blood pressure reading 10/29/2016  . Chronic combined systolic and diastolic CHF (congestive heart failure) ([place])   . S/P CABG x 2   . CKD (chronic kidney disease), stage III ([place])   . Fatigue 11/11/2014  . NSVT (nonsustained ventricular tachycardia) ([place]) 11/11/2014  . LV dysfunction 07/23/2014  . Hyperlipidemia 07/27/2013  . Dizzy spells 10/24/2011  . CAD (coronary artery disease) 03/30/2011  . PVC's (premature ventricular contractions) 03/30/2011  . ANEMIA, IRON DEFICIENCY 03/12/2008  . GERD 03/12/2008    Past Surgical History:  Procedure Laterality Date  . AXILLARY LYMPH NODE DISSECTION Bilateral 2001  . CARDIAC CATHETERIZATION  04/28/2001  . CARDIAC CATHETERIZATION N/A 11/19/2014   Procedure: Right/Left Heart Cath and Coronary/Graft Angiography;  Surgeon: Jettie Booze, MD;  Location: [facility];  Service: Cardiovascular;  Laterality: N/A;  . CATARACT EXTRACTION W/ INTRAOCULAR LENS  IMPLANT, BILATERAL Bilateral ~ 2010  . CHOLECYSTECTOMY N/A 05/10/2020  Procedure: LAPAROSCOPIC CHOLECYSTECTOMY WITH INTRAOPERATIVE CHOLANGIOGRAM;  Surgeon: Coralie Keens, MD;  Location: [facility];  Service: General;  Laterality: N/A;  . CORONARY ARTERY BYPASS GRAFT  05/15/2001   x3 sequential LIMA to diag and LAD, SVG to OM   . ERCP N/A 05/11/2020   Procedure: ENDOSCOPIC RETROGRADE CHOLANGIOPANCREATOGRAPHY (ERCP);  Surgeon: Clarene Essex, MD;  Location: [facility];  Service: Endoscopy;  Laterality: N/A;  . MELANOMA EXCISION   2001   off of back  . REMOVAL OF STONES  05/11/2020   Procedure: REMOVAL OF STONES;  Surgeon: Clarene Essex, MD;  Location: [facility];  Service: Endoscopy;;  . Joan Mayans  05/11/2020   Procedure: Joan Mayans;  Surgeon: Clarene Essex, MD;  Location: [facility];  Service: Endoscopy;;       Family History  Problem Relation Age of Onset  . Prostate cancer Father   . Liver cancer Mother   . Hyperlipidemia Son   . Hyperlipidemia Daughter     Social History   Tobacco Use  . Smoking status: Never Smoker  . Smokeless tobacco: Never Used  Substance Use Topics  . Alcohol use: Yes    Alcohol/week: 6.0 standard drinks    Types: 3 Glasses of wine, 3 Cans of beer per week  . Drug use: No    Home Medications Prior to Admission medications   Medication Sig Start Date End Date Taking? Authorizing Provider  aspirin 81 MG tablet Take 81 mg by mouth daily.    [provider]  Evolocumab (REPATHA SURECLICK) [id number] MG/ML SOAJ Inject 140 mg into the skin every 28 (twenty-eight) days. Patient not taking: Reported on 05/13/2020    [provider]  ezetimibe (ZETIA) 10 MG tablet Take 10 mg by mouth daily.    [provider]  ferrous sulfate 325 (65 FE) MG tablet Take 1 tablet (325 mg total) by mouth daily. 05/16/20 05/16/21  Elodia Florence., MD  isosorbide mononitrate (IMDUR) 30 MG 24 hr tablet Take 30 mg by mouth daily.    [provider]  levothyroxine (SYNTHROID, LEVOTHROID) 125 MCG tablet Take 1 tablet by mouth daily before breakfast. 04/13/16   [provider]  loperamide (IMODIUM) 2 MG capsule Take 1 capsule (2 mg total) by mouth as needed for diarrhea or loose stools. 05/16/20   Elodia Florence., MD  mirtazapine (REMERON) 15 MG tablet Take 15 mg by mouth at bedtime.    [provider]  nitroGLYCERIN (NITROSTAT) 0.4 MG SL tablet Place 1 tablet (0.4 mg total) under the tongue every 5 (five) minutes as needed for chest pain. 07/30/19  10/28/19  Shirley Friar, PA-C  nitroGLYCERIN (NITROSTAT) 0.4 MG SL tablet Place 0.4 mg under the tongue every 5 (five) minutes as needed for chest pain.    [provider]  omeprazole (PRILOSEC) 20 MG capsule Take 20 mg by mouth daily.    [provider]    Allergies    Atenolol  Review of Systems   Review of Systems  Constitutional: Positive for appetite change. Negative for chills and fever.  HENT: Negative for sore throat.   Eyes: Negative for redness.  Respiratory: Positive for cough. Negative for shortness of breath.   Cardiovascular: Negative for chest pain and leg swelling.  Gastrointestinal: Negative for abdominal pain, diarrhea and vomiting.  Endocrine: Negative for polyuria.  Genitourinary: Negative for dysuria and flank pain.  Musculoskeletal: Negative for back pain and neck pain.  Skin: Negative for rash.  Neurological: Positive for weakness. Negative for  speech difficulty, numbness and headaches.  Hematological: Does not bruise/bleed easily.  Psychiatric/Behavioral: Negative for confusion.    Physical Exam Updated Vital Signs Ht 1.829 m (6')   Wt 71 kg   BMI 21.23 kg/m   Physical Exam Vitals and nursing note reviewed.  Constitutional:      Appearance: Normal appearance. He is well-developed.  HENT:     Head: Atraumatic.     Nose: Nose normal.     Mouth/Throat:     Mouth: Mucous membranes are moist.     Pharynx: Oropharynx is clear.  Eyes:     General: No scleral icterus.    Conjunctiva/sclera: Conjunctivae normal.     Comments: Left pupils irregular (prior cataract surgery)  Neck:     Vascular: No carotid bruit.     Trachea: No tracheal deviation.     Comments: No stiffness or rigidity.  Cardiovascular:     Rate and Rhythm: Normal rate and regular rhythm.     Pulses: Normal pulses.     Heart sounds: Normal heart sounds. No murmur heard. No friction rub. No gallop.   Pulmonary:     Effort: Pulmonary effort is normal. No  accessory muscle usage or respiratory distress.     Breath sounds: Normal breath sounds.  Abdominal:     General: Bowel sounds are normal. There is no distension.     Palpations: Abdomen is soft.     Tenderness: There is no guarding.     Comments: Mild lower abd tenderness. No peritonitis.   Genitourinary:    Comments: No cva tenderness. Musculoskeletal:        General: No swelling or tenderness.     Cervical back: Normal range of motion and neck supple. No rigidity.     Right lower leg: No edema.     Left lower leg: No edema.  Skin:    General: Skin is warm and dry.     Findings: No rash.  Neurological:     Mental Status: He is alert.     Comments: Alert, speech clear. Motor/sens grossly intact. Too weak to get up and walk.   Psychiatric:        Mood and Affect: Mood normal.      ED Results / Procedures / Treatments   Labs (all labs ordered are listed, but only abnormal results are displayed) Results for orders placed or performed during the hospital encounter of 05/26/20  CBC  Result Value Ref Range   WBC 19.2 (H) 4.0 - 10.5 K/uL   RBC 3.40 (L) 4.22 - 5.81 MIL/uL   Hemoglobin 10.1 (L) 13.0 - 17.0 g/dL   HCT 31.4 (L) 39.0 - 52.0 %   MCV 92.4 80.0 - 100.0 fL   MCH 29.7 26.0 - 34.0 pg   MCHC 32.2 30.0 - 36.0 g/dL   RDW 13.4 11.5 - 15.5 %   Platelets 387 150 - 400 K/uL   nRBC 0.0 0.0 - 0.2 %  Comprehensive metabolic panel  Result Value Ref Range   Sodium 136 135 - 145 mmol/L   Potassium 4.0 3.5 - 5.1 mmol/L   Chloride 99 98 - 111 mmol/L   CO2 25 22 - 32 mmol/L   Glucose, Bld 128 (H) 70 - 99 mg/dL   BUN 20 8 - 23 mg/dL   Creatinine, Ser 1.20 0.61 - 1.24 mg/dL   Calcium 8.3 (L) 8.9 - 10.3 mg/dL   Total Protein 6.5 6.5 - 8.1 g/dL   Albumin 2.8 (L) 3.5 -  5.0 g/dL   AST 24 15 - 41 U/L   ALT 22 0 - 44 U/L   Alkaline Phosphatase 115 38 - 126 U/L   Total Bilirubin 0.5 0.3 - 1.2 mg/dL   GFR, Estimated 57 (L) >60 mL/min   Anion gap 12 5 - 15  TSH  Result Value Ref  Range   TSH 0.546 0.350 - 4.500 uIU/mL  Troponin I (High Sensitivity)  Result Value Ref Range   Troponin I (High Sensitivity) 21 (H) <18 ng/L   DG Cholangiogram Operative  Result Date: 05/10/2020 CLINICAL DATA:  Cholelithiasis. EXAM: INTRAOPERATIVE CHOLANGIOGRAM TECHNIQUE: Cholangiographic images from the C-arm fluoroscopic device were submitted for interpretation post-operatively. Please see the procedural report for the amount of contrast and the fluoroscopy time utilized. COMPARISON:  None. FINDINGS: Submitted images demonstrate opacification of the biliary tree through cystic duct remnant. There is partial opacification of the pancreatic duct the she sees he is to without significant abnormality. Multiple small filling defects seen within the distal common bile duct most likely related to calculi. There is flow of contrast into the duodenum. IMPRESSION: Intraoperative cholangiogram as above. Electronically Signed   By: Miachel Roux M.D.   On: 05/10/2020 11:07   CT Abdomen Pelvis W Contrast  Result Date: 05/26/2020 CLINICAL DATA:  Acute abdominal pain. Increased white cell count and weakness. Recent cholecystectomy. EXAM: CT ABDOMEN AND PELVIS WITH CONTRAST TECHNIQUE: Multidetector CT imaging of the abdomen and pelvis was performed using the standard protocol following bolus administration of intravenous contrast. CONTRAST:  163mL OMNIPAQUE IOHEXOL 300 MG/ML  SOLN COMPARISON:  05/13/2020 FINDINGS: Lower chest: Peripheral fibrosis and mild honeycomb changes consistent with usual interstitial pneumonitis. Postoperative changes in the mediastinum. Normal heart size. Small esophageal hiatal hernia. Hepatobiliary: Postoperative cholecystectomy with residual fluid in the gallbladder fossa. Amount of fluid is decreased and the gas seen previously is diminished. Decreasing infiltration in the surrounding fat. Pneumobilia consistent with postoperative change. No focal liver lesions. Pancreas: Unremarkable. No  pancreatic ductal dilatation or surrounding inflammatory changes. Spleen: Scattered calcified granulomas. Adrenals/Urinary Tract: Adrenal glands are unremarkable. Kidneys are normal, without renal calculi, focal lesion, or hydronephrosis. Bladder is unremarkable. Stomach/Bowel: Stomach, small bowel, and colon are not abnormally distended. Stool fills the colon. Diverticulosis of the sigmoid colon with diffuse wall thickening. No change in appearance since previous study. Likely muscular hypertrophy but can not exclude early diverticulitis. No abscess. Appendix is normal. Vascular/Lymphatic: Aortic atherosclerosis. No enlarged abdominal or pelvic lymph nodes. Reproductive: Prostate is unremarkable. Other: No free air or free fluid in the abdomen. Scarring in the periumbilical region is likely postoperative. Abdominal wall musculature appears intact. Musculoskeletal: Degenerative changes in the spine and hips. Mild lumbar scoliosis convex towards the left. No destructive bone lesions. IMPRESSION: 1. Postoperative cholecystectomy with residual fluid in the gallbladder fossa. Amount of fluid is decreased and the gas seen previously is diminished. Decreasing infiltration in the surrounding fat. 2. Diverticulosis of the sigmoid colon with diffuse wall thickening. Likely muscular hypertrophy but can not exclude early diverticulitis. No abscess. No change since prior study. 3. Peripheral fibrosis and mild honeycomb changes in the lungs consistent with usual interstitial pneumonitis. 4. Small esophageal hiatal hernia. 5. Aortic atherosclerosis. Aortic Atherosclerosis (ICD10-I70.0). Electronically Signed   By: Lucienne Capers M.D.   On: 05/26/2020 19:46   CT ABDOMEN PELVIS W CONTRAST  Result Date: 05/13/2020 CLINICAL DATA:  Abdominal pain EXAM: CT ABDOMEN AND PELVIS WITH CONTRAST TECHNIQUE: Multidetector CT imaging of the abdomen and pelvis was performed using  the standard protocol following bolus administration of  intravenous contrast. CONTRAST:  158mL OMNIPAQUE IOHEXOL 300 MG/ML  SOLN COMPARISON:  None. FINDINGS: LOWER CHEST: Bibasilar atelectasis and small pleural effusions. HEPATOBILIARY: Small amount of perihepatic fluid. Mild pneumobilia. Fluid collection at the gallbladder fossa status post recent cholecystectomy. PANCREAS: Normal pancreas. No ductal dilatation or peripancreatic fluid collection. SPLEEN: Normal. ADRENALS/URINARY TRACT: The adrenal glands are normal. No hydronephrosis, nephroureterolithiasis or solid renal mass. The urinary bladder is normal for degree of distention STOMACH/BOWEL: There is no hiatal hernia. Normal duodenal course and caliber. No small bowel dilatation or inflammation. There is rectosigmoid diverticulosis with mild wall thickening. Normal appendix. VASCULAR/LYMPHATIC: There is calcific atherosclerosis of the abdominal aorta. No lymphadenopathy. REPRODUCTIVE: Normal prostate size with symmetric seminal vesicles. MUSCULOSKELETAL. Multilevel degenerative disc disease and facet arthrosis. No bony spinal canal stenosis. OTHER: None. IMPRESSION: 1. Status post recent cholecystectomy and sphincterotomy with fluid collection at the gallbladder fossa, and small volume pneumobilia. No specific features of infection. 2. Rectosigmoid diverticulosis with mild wall thickening, which may indicate early acute diverticulitis. 3. Small pleural effusions and bibasilar atelectasis. Aortic Atherosclerosis (ICD10-I70.0). Electronically Signed   By: Ulyses Jarred M.D.   On: 05/13/2020 23:31   MR 3D Recon At Scanner  Result Date: 05/09/2020 CLINICAL DATA:  Abdominal pain cholelithiasis, dilated CBD EXAM: MRI ABDOMEN WITHOUT AND WITH CONTRAST (INCLUDING MRCP) TECHNIQUE: Multiplanar multisequence MR imaging of the abdomen was performed both before and after the administration of intravenous contrast. Heavily T2-weighted images of the biliary and pancreatic ducts were obtained, and three-dimensional MRCP images  were rendered by post processing. CONTRAST:  28mL GADAVIST GADOBUTROL 1 MMOL/ML IV SOLN COMPARISON:  Right upper quadrant ultrasound dated 05/08/2020. CT abdomen dated 02/26/2020 FINDINGS: Motion degraded images. Lower chest: Lung bases are clear. Hepatobiliary: No morphologic findings of cirrhosis. No hepatic steatosis. Mildly heterogeneous early arterial perfusion (series 22/image 37) which normalizes on later phases. No suspicious/enhancing hepatic lesions. Layering gallstones (series 16/image 23), with very mild gallbladder wall thickening/edema (series 3/image 23). No intrahepatic ductal dilatation. Mildly dilated common duct, measuring 10 mm (series 5/image 14), unchanged from prior CT. No choledocholithiasis is seen. Pancreas:  Within normal limits. Spleen:  Within normal limits. Adrenals/Urinary Tract:  Adrenal glands are within normal limits. Tiny bilateral renal cysts measuring up to 5 mm (series 5/image 18 and 22). No hydronephrosis. Stomach/Bowel: Stomach is within normal limits. Visualized bowel is grossly unremarkable. Vascular/Lymphatic:  No evidence of abdominal aortic aneurysm. No suspicious abdominal lymphadenopathy. Other:  No abdominal ascites. Musculoskeletal: No focal osseous lesions. Mild degenerative changes of the lumbar spine with levoscoliosis. IMPRESSION: Motion degraded images. No intrahepatic ductal dilatation. Mildly dilated common duct, measuring 10 mm, unchanged from prior CT. No choledocholithiasis is seen. In the setting of abnormal LFTs, recent passage a common duct stone is possible. Given the degree of motion degradation, nonvisualized distal CBD stone or stricture is also possible, although these are not favored given the relative stability from prior CT. Cholelithiasis with very mild gallbladder wall thickening/edema. While very early acute cholecystitis is difficult to exclude, this appearance may be secondary to a right upper quadrant inflammatory process such as hepatitis.  Consider follow-up right upper quadrant ultrasound or hepatobiliary nuclear medicine scan if symptoms persist. Electronically Signed   By: Julian Hy M.D.   On: 05/09/2020 12:00   DG Chest Port 1 View  Result Date: 05/26/2020 CLINICAL DATA:  Generalized weakness, COVID-19 positive 10 days ago, history of melanoma EXAM: PORTABLE CHEST 1 VIEW COMPARISON:  10/29/2016  FINDINGS: Single frontal view of the chest demonstrates an unremarkable cardiac silhouette. No airspace disease, effusion, or pneumothorax. Postsurgical changes bilateral axilla. No acute bony abnormalities. IMPRESSION: 1. No acute intrathoracic process. Electronically Signed   By: Randa Ngo M.D.   On: 05/26/2020 17:19   DG ERCP BILIARY & PANCREATIC DUCTS  Result Date: 05/11/2020 CLINICAL DATA:  Choledocholithiasis suspected on intraoperative cholangiogram EXAM: ERCP TECHNIQUE: Multiple spot images obtained with the fluoroscopic device and submitted for interpretation post-procedure. COMPARISON:  05/10/2020 FINDINGS: Series of fluoroscopic spot images document endoscopic cannulation and opacification of the CBD with subsequent balloon catheter passage. There is incomplete opacification of the intrahepatic biliary tree, which appears decompressed centrally. Cholecystectomy clips without extravasation. IMPRESSION: Endoscopic CBD cannulation and intervention as above. These images were submitted for radiologic interpretation only. Please see the procedural report for the amount of contrast and the fluoroscopy time utilized. Electronically Signed   By: Lucrezia Europe M.D.   On: 05/11/2020 13:23   DG Abdomen Acute W/Chest  Result Date: 05/13/2020 CLINICAL DATA:  Abdominal pain EXAM: DG ABDOMEN ACUTE WITH 1 VIEW CHEST COMPARISON:  None. FINDINGS: There is no evidence of dilated bowel loops or free intraperitoneal air. No radiopaque calculi or other significant radiographic abnormality is seen. Heart size and mediastinal contours are within  normal limits. Both lungs are clear. IMPRESSION: Negative abdominal radiographs.  No acute cardiopulmonary disease. Electronically Signed   By: Ulyses Jarred M.D.   On: 05/13/2020 21:55   MR ABDOMEN MRCP W WO CONTAST  Result Date: 05/09/2020 CLINICAL DATA:  Abdominal pain cholelithiasis, dilated CBD EXAM: MRI ABDOMEN WITHOUT AND WITH CONTRAST (INCLUDING MRCP) TECHNIQUE: Multiplanar multisequence MR imaging of the abdomen was performed both before and after the administration of intravenous contrast. Heavily T2-weighted images of the biliary and pancreatic ducts were obtained, and three-dimensional MRCP images were rendered by post processing. CONTRAST:  50mL GADAVIST GADOBUTROL 1 MMOL/ML IV SOLN COMPARISON:  Right upper quadrant ultrasound dated 05/08/2020. CT abdomen dated 02/26/2020 FINDINGS: Motion degraded images. Lower chest: Lung bases are clear. Hepatobiliary: No morphologic findings of cirrhosis. No hepatic steatosis. Mildly heterogeneous early arterial perfusion (series 22/image 37) which normalizes on later phases. No suspicious/enhancing hepatic lesions. Layering gallstones (series 16/image 23), with very mild gallbladder wall thickening/edema (series 3/image 23). No intrahepatic ductal dilatation. Mildly dilated common duct, measuring 10 mm (series 5/image 14), unchanged from prior CT. No choledocholithiasis is seen. Pancreas:  Within normal limits. Spleen:  Within normal limits. Adrenals/Urinary Tract:  Adrenal glands are within normal limits. Tiny bilateral renal cysts measuring up to 5 mm (series 5/image 18 and 22). No hydronephrosis. Stomach/Bowel: Stomach is within normal limits. Visualized bowel is grossly unremarkable. Vascular/Lymphatic:  No evidence of abdominal aortic aneurysm. No suspicious abdominal lymphadenopathy. Other:  No abdominal ascites. Musculoskeletal: No focal osseous lesions. Mild degenerative changes of the lumbar spine with levoscoliosis. IMPRESSION: Motion degraded images.  No intrahepatic ductal dilatation. Mildly dilated common duct, measuring 10 mm, unchanged from prior CT. No choledocholithiasis is seen. In the setting of abnormal LFTs, recent passage a common duct stone is possible. Given the degree of motion degradation, nonvisualized distal CBD stone or stricture is also possible, although these are not favored given the relative stability from prior CT. Cholelithiasis with very mild gallbladder wall thickening/edema. While very early acute cholecystitis is difficult to exclude, this appearance may be secondary to a right upper quadrant inflammatory process such as hepatitis. Consider follow-up right upper quadrant ultrasound or hepatobiliary nuclear medicine scan if symptoms persist.  Electronically Signed   By: Julian Hy M.D.   On: 05/09/2020 12:00   VAS [place] LOWER EXTREMITY VENOUS (DVT)  Result Date: 05/16/2020  Lower Venous DVT Study Indications: D-dimer, Covid-19.  Risk Factors: Surgery 05-10-2020 Laprascopic cholecystectomy. Anticoagulation: Lovenox. Comparison Study: No prior studies. Performing Technologist: Darlin Coco RDMS  Examination Guidelines: A complete evaluation includes B-mode imaging, spectral Doppler, color Doppler, and power Doppler as needed of all accessible portions of each vessel. Bilateral testing is considered an integral part of a complete examination. Limited examinations for reoccurring indications may be performed as noted. The reflux portion of the exam is performed with the patient in reverse Trendelenburg.  +---------+---------------+---------+-----------+----------+--------------+ RIGHT    CompressibilityPhasicitySpontaneityPropertiesThrombus Aging +---------+---------------+---------+-----------+----------+--------------+ CFV      Full           Yes      Yes                                 +---------+---------------+---------+-----------+----------+--------------+ SFJ      Full                                                         +---------+---------------+---------+-----------+----------+--------------+ FV Prox  Full                                                        +---------+---------------+---------+-----------+----------+--------------+ FV Mid   Full                                                        +---------+---------------+---------+-----------+----------+--------------+ FV DistalFull                                                        +---------+---------------+---------+-----------+----------+--------------+ PFV      Full                                                        +---------+---------------+---------+-----------+----------+--------------+ POP      Full           Yes      Yes                                 +---------+---------------+---------+-----------+----------+--------------+ PTV      Full                                                        +---------+---------------+---------+-----------+----------+--------------+  PERO     Full                                                        +---------+---------------+---------+-----------+----------+--------------+   +---------+---------------+---------+-----------+----------+--------------+ LEFT     CompressibilityPhasicitySpontaneityPropertiesThrombus Aging +---------+---------------+---------+-----------+----------+--------------+ CFV      Full           Yes      Yes                                 +---------+---------------+---------+-----------+----------+--------------+ SFJ      Full                                                        +---------+---------------+---------+-----------+----------+--------------+ FV Prox  Full                                                        +---------+---------------+---------+-----------+----------+--------------+ FV Mid   Full                                                         +---------+---------------+---------+-----------+----------+--------------+ FV DistalFull                                                        +---------+---------------+---------+-----------+----------+--------------+ PFV      Full                                                        +---------+---------------+---------+-----------+----------+--------------+ POP      Full           Yes      Yes                                 +---------+---------------+---------+-----------+----------+--------------+ PTV      Full                                                        +---------+---------------+---------+-----------+----------+--------------+ PERO     Full                                                        +---------+---------------+---------+-----------+----------+--------------+  Summary: RIGHT: - There is no evidence of deep vein thrombosis in the lower extremity.  - No cystic structure found in the popliteal fossa.  LEFT: - There is no evidence of deep vein thrombosis in the lower extremity.  - A cystic structure is found in the popliteal fossa measuring 2.8 cm.  *See table(s) above for measurements and observations. Electronically signed by Ruta Hinds MD on 05/16/2020 at 5:19:00 PM.    Final    US Abdomen Limited RUQ (LIVER/GB)  Result Date: 05/08/2020 CLINICAL DATA:  Right upper quadrant pain EXAM: ULTRASOUND ABDOMEN LIMITED RIGHT UPPER QUADRANT COMPARISON:  None. FINDINGS: Gallbladder: Multiple stones are seen in the gallbladder. No wall thickening, pericholecystic fluid, or Murphy's sign. Common bile duct: Diameter: 6 mm Liver: No focal lesion identified. Within normal limits in parenchymal echogenicity. Portal vein is patent on color Doppler imaging with normal direction of blood flow towards the liver. Other: None. IMPRESSION: 1. Cholelithiasis without wall thickening, pericholecystic fluid, or Murphy's sign. If the clinical picture remains ambiguous,  recommend HIDA scan. 2. The common bile duct is borderline measuring 6.3 mm. Recommend correlation with clinical picture and labs. If there is concern for biliary obstruction, recommend MRCP or ERCP. Electronically Signed   By: Dorise Bullion III M.D   On: 05/08/2020 16:30    EKG None  Radiology CT Abdomen Pelvis W Contrast  Result Date: 05/26/2020 CLINICAL DATA:  Acute abdominal pain. Increased white cell count and weakness. Recent cholecystectomy. EXAM: CT ABDOMEN AND PELVIS WITH CONTRAST TECHNIQUE: Multidetector CT imaging of the abdomen and pelvis was performed using the standard protocol following bolus administration of intravenous contrast. CONTRAST:  152mL OMNIPAQUE IOHEXOL 300 MG/ML  SOLN COMPARISON:  05/13/2020 FINDINGS: Lower chest: Peripheral fibrosis and mild honeycomb changes consistent with usual interstitial pneumonitis. Postoperative changes in the mediastinum. Normal heart size. Small esophageal hiatal hernia. Hepatobiliary: Postoperative cholecystectomy with residual fluid in the gallbladder fossa. Amount of fluid is decreased and the gas seen previously is diminished. Decreasing infiltration in the surrounding fat. Pneumobilia consistent with postoperative change. No focal liver lesions. Pancreas: Unremarkable. No pancreatic ductal dilatation or surrounding inflammatory changes. Spleen: Scattered calcified granulomas. Adrenals/Urinary Tract: Adrenal glands are unremarkable. Kidneys are normal, without renal calculi, focal lesion, or hydronephrosis. Bladder is unremarkable. Stomach/Bowel: Stomach, small bowel, and colon are not abnormally distended. Stool fills the colon. Diverticulosis of the sigmoid colon with diffuse wall thickening. No change in appearance since previous study. Likely muscular hypertrophy but can not exclude early diverticulitis. No abscess. Appendix is normal. Vascular/Lymphatic: Aortic atherosclerosis. No enlarged abdominal or pelvic lymph nodes. Reproductive:  Prostate is unremarkable. Other: No free air or free fluid in the abdomen. Scarring in the periumbilical region is likely postoperative. Abdominal wall musculature appears intact. Musculoskeletal: Degenerative changes in the spine and hips. Mild lumbar scoliosis convex towards the left. No destructive bone lesions. IMPRESSION: 1. Postoperative cholecystectomy with residual fluid in the gallbladder fossa. Amount of fluid is decreased and the gas seen previously is diminished. Decreasing infiltration in the surrounding fat. 2. Diverticulosis of the sigmoid colon with diffuse wall thickening. Likely muscular hypertrophy but can not exclude early diverticulitis. No abscess. No change since prior study. 3. Peripheral fibrosis and mild honeycomb changes in the lungs consistent with usual interstitial pneumonitis. 4. Small esophageal hiatal hernia. 5. Aortic atherosclerosis. Aortic Atherosclerosis (ICD10-I70.0). Electronically Signed   By: Lucienne Capers M.D.   On: 05/26/2020 19:46   DG Chest Port 1 View  Result Date: 05/26/2020 CLINICAL DATA:  Generalized  weakness, COVID-19 positive 10 days ago, history of melanoma EXAM: PORTABLE CHEST 1 VIEW COMPARISON:  10/29/2016 FINDINGS: Single frontal view of the chest demonstrates an unremarkable cardiac silhouette. No airspace disease, effusion, or pneumothorax. Postsurgical changes bilateral axilla. No acute bony abnormalities. IMPRESSION: 1. No acute intrathoracic process. Electronically Signed   By: Randa Ngo M.D.   On: 05/26/2020 17:19    Procedures Procedures   Medications Ordered in ED Medications - No data to display  ED Course  I have reviewed the triage vital signs and the nursing notes.  Pertinent labs & imaging results that were available during my care of the patient were reviewed by me and considered in my medical decision making (see chart for details).    MDM Rules/Calculators/A&P                          Iv ns bolus. Stat labs. Imaging  ordered.   Reviewed nursing notes and prior charts for additional history. Recent admission reviewed.   MDM Number of Diagnoses or Management Options   Amount and/or Complexity of Data Reviewed Clinical lab tests: ordered and reviewed Tests in the radiology section of CPT: ordered and reviewed Tests in the medicine section of CPT: ordered and reviewed Discussion of test results with the performing providers: yes Decide to obtain previous medical records or to obtain history from someone other than the patient: yes Obtain history from someone other than the patient: yes Review and summarize past medical records: yes Discuss the patient with other providers: yes Independent visualization of images, tracings, or specimens: yes  Risk of Complications, Morbidity, and/or Mortality Presenting problems: high Diagnostic procedures: high Management options: high  Labs reviewed/interpreted by me - wbc elevated/increased from prior.   Given elevated wbc, increased weakness, recent gallbladder and diverticulitis - will ct abd.   Xrays reviewed/interpreted by me -  C/w prior, no new and/or worsening infection noted, but persistent colon wall thickening noted.    Suspect general weakness due to several recent illnesses - gallbladder/gallbladder surgery, followed by diverticultis and covid infection.    Iv and po fluids. Food.   Daughter present, states patient was improving on abx therapy, getting around fine, showering on own, ambulatory etc, and that after antibiotic stopped, increased weakness, chills, low grade fever today, now too weak to walk - requests admission.  Given perceived worsening of symptoms since stopping abx, increased wbc, return of fever, mild colon thickening on ct - will also tx w abx in ED.  (Pt is also noted to be on day 12 since positive covid test - despite having 2 vaccinations and booster, ?contribution that may be playing.)  Hospitalists consulted for admission.  Discussed with Dr Hal Hope - he will admit, request add head ct - added.     Final Clinical Impression(s) / ED Diagnoses Final diagnoses:  None    Rx / DC Orders ED Discharge Orders    None           Lajean Saver, MD 05/26/20 2114

## 2020-05-27 ENCOUNTER — Encounter (HOSPITAL_COMMUNITY): Payer: Self-pay | Admitting: Internal Medicine

## 2020-05-27 ENCOUNTER — Observation Stay (HOSPITAL_COMMUNITY): Payer: PPO

## 2020-05-27 DIAGNOSIS — I2510 Atherosclerotic heart disease of native coronary artery without angina pectoris: Secondary | ICD-10-CM | POA: Diagnosis present

## 2020-05-27 DIAGNOSIS — E785 Hyperlipidemia, unspecified: Secondary | ICD-10-CM | POA: Diagnosis present

## 2020-05-27 DIAGNOSIS — J479 Bronchiectasis, uncomplicated: Secondary | ICD-10-CM | POA: Diagnosis present

## 2020-05-27 DIAGNOSIS — A0472 Enterocolitis due to Clostridium difficile, not specified as recurrent: Secondary | ICD-10-CM | POA: Diagnosis not present

## 2020-05-27 DIAGNOSIS — I493 Ventricular premature depolarization: Secondary | ICD-10-CM | POA: Diagnosis present

## 2020-05-27 DIAGNOSIS — J8410 Pulmonary fibrosis, unspecified: Secondary | ICD-10-CM | POA: Diagnosis not present

## 2020-05-27 DIAGNOSIS — Z8616 Personal history of COVID-19: Secondary | ICD-10-CM | POA: Diagnosis not present

## 2020-05-27 DIAGNOSIS — M797 Fibromyalgia: Secondary | ICD-10-CM | POA: Diagnosis present

## 2020-05-27 DIAGNOSIS — E039 Hypothyroidism, unspecified: Secondary | ICD-10-CM | POA: Diagnosis present

## 2020-05-27 DIAGNOSIS — M353 Polymyalgia rheumatica: Secondary | ICD-10-CM | POA: Diagnosis present

## 2020-05-27 DIAGNOSIS — E876 Hypokalemia: Secondary | ICD-10-CM | POA: Diagnosis present

## 2020-05-27 DIAGNOSIS — I429 Cardiomyopathy, unspecified: Secondary | ICD-10-CM | POA: Diagnosis present

## 2020-05-27 DIAGNOSIS — Z79890 Hormone replacement therapy (postmenopausal): Secondary | ICD-10-CM | POA: Diagnosis not present

## 2020-05-27 DIAGNOSIS — Z79899 Other long term (current) drug therapy: Secondary | ICD-10-CM | POA: Diagnosis not present

## 2020-05-27 DIAGNOSIS — J9811 Atelectasis: Secondary | ICD-10-CM | POA: Diagnosis not present

## 2020-05-27 DIAGNOSIS — J90 Pleural effusion, not elsewhere classified: Secondary | ICD-10-CM | POA: Diagnosis not present

## 2020-05-27 DIAGNOSIS — R152 Fecal urgency: Secondary | ICD-10-CM | POA: Diagnosis present

## 2020-05-27 DIAGNOSIS — U071 COVID-19: Secondary | ICD-10-CM | POA: Diagnosis not present

## 2020-05-27 DIAGNOSIS — R4182 Altered mental status, unspecified: Secondary | ICD-10-CM | POA: Diagnosis not present

## 2020-05-27 DIAGNOSIS — Z951 Presence of aortocoronary bypass graft: Secondary | ICD-10-CM | POA: Diagnosis not present

## 2020-05-27 DIAGNOSIS — Z9889 Other specified postprocedural states: Secondary | ICD-10-CM | POA: Diagnosis not present

## 2020-05-27 DIAGNOSIS — N179 Acute kidney failure, unspecified: Secondary | ICD-10-CM | POA: Diagnosis not present

## 2020-05-27 DIAGNOSIS — Z85820 Personal history of malignant melanoma of skin: Secondary | ICD-10-CM | POA: Diagnosis not present

## 2020-05-27 DIAGNOSIS — I2699 Other pulmonary embolism without acute cor pulmonale: Secondary | ICD-10-CM | POA: Diagnosis not present

## 2020-05-27 DIAGNOSIS — R7989 Other specified abnormal findings of blood chemistry: Secondary | ICD-10-CM | POA: Diagnosis not present

## 2020-05-27 DIAGNOSIS — I5042 Chronic combined systolic (congestive) and diastolic (congestive) heart failure: Secondary | ICD-10-CM | POA: Diagnosis present

## 2020-05-27 DIAGNOSIS — I248 Other forms of acute ischemic heart disease: Secondary | ICD-10-CM | POA: Diagnosis present

## 2020-05-27 DIAGNOSIS — Z7982 Long term (current) use of aspirin: Secondary | ICD-10-CM | POA: Diagnosis not present

## 2020-05-27 DIAGNOSIS — D6489 Other specified anemias: Secondary | ICD-10-CM | POA: Diagnosis present

## 2020-05-27 DIAGNOSIS — R531 Weakness: Secondary | ICD-10-CM

## 2020-05-27 DIAGNOSIS — Z9049 Acquired absence of other specified parts of digestive tract: Secondary | ICD-10-CM | POA: Diagnosis not present

## 2020-05-27 DIAGNOSIS — N183 Chronic kidney disease, stage 3 unspecified: Secondary | ICD-10-CM | POA: Diagnosis present

## 2020-05-27 DIAGNOSIS — I509 Heart failure, unspecified: Secondary | ICD-10-CM | POA: Diagnosis not present

## 2020-05-27 DIAGNOSIS — J439 Emphysema, unspecified: Secondary | ICD-10-CM | POA: Diagnosis not present

## 2020-05-27 DIAGNOSIS — L89159 Pressure ulcer of sacral region, unspecified stage: Secondary | ICD-10-CM | POA: Diagnosis present

## 2020-05-27 LAB — GASTROINTESTINAL PANEL BY PCR, STOOL (REPLACES STOOL CULTURE)

## 2020-05-27 LAB — CBC WITH DIFFERENTIAL/PLATELET
Abs Immature Granulocytes: 0.34 10*3/uL — ABNORMAL HIGH (ref 0.00–0.07)
Basophils Absolute: 0 10*3/uL (ref 0.0–0.1)
Basophils Relative: 0 %
Eosinophils Absolute: 0 10*3/uL (ref 0.0–0.5)
Eosinophils Relative: 0 %
HCT: 29.2 % — ABNORMAL LOW (ref 39.0–52.0)
Hemoglobin: 9.5 g/dL — ABNORMAL LOW (ref 13.0–17.0)
Immature Granulocytes: 1 %
Lymphocytes Relative: 3 %
Lymphs Abs: 0.8 10*3/uL (ref 0.7–4.0)
MCH: 30 pg (ref 26.0–34.0)
MCHC: 32.5 g/dL (ref 30.0–36.0)
MCV: 92.1 fL (ref 80.0–100.0)
Monocytes Absolute: 1.4 10*3/uL — ABNORMAL HIGH (ref 0.1–1.0)
Monocytes Relative: 5 %
Neutro Abs: 23.3 10*3/uL — ABNORMAL HIGH (ref 1.7–7.7)
Neutrophils Relative %: 91 %
Platelets: 342 10*3/uL (ref 150–400)
RBC: 3.17 MIL/uL — ABNORMAL LOW (ref 4.22–5.81)
RDW: 13.7 % (ref 11.5–15.5)
WBC: 25.9 10*3/uL — ABNORMAL HIGH (ref 4.0–10.5)
nRBC: 0 % (ref 0.0–0.2)

## 2020-05-27 LAB — BASIC METABOLIC PANEL
Anion gap: 8 (ref 5–15)
BUN: 16 mg/dL (ref 8–23)
CO2: 24 mmol/L (ref 22–32)
Calcium: 7.8 mg/dL — ABNORMAL LOW (ref 8.9–10.3)
Chloride: 104 mmol/L (ref 98–111)
Creatinine, Ser: 1.09 mg/dL (ref 0.61–1.24)
GFR, Estimated: >60 mL/min (ref >60)
Glucose, Bld: 120 mg/dL — ABNORMAL HIGH (ref 70–99)
Potassium: 4.1 mmol/L (ref 3.5–5.1)
Sodium: 136 mmol/L (ref 135–145)

## 2020-05-27 LAB — URINALYSIS, ROUTINE W REFLEX MICROSCOPIC
Bilirubin Urine: NEGATIVE
Glucose, UA: NEGATIVE mg/dL
Hgb urine dipstick: NEGATIVE
Ketones, ur: NEGATIVE mg/dL
Leukocytes,Ua: NEGATIVE
Nitrite: NEGATIVE
Protein, ur: NEGATIVE mg/dL
Specific Gravity, Urine: 1.028 (ref 1.005–1.030)
pH: 6 (ref 5.0–8.0)

## 2020-05-27 LAB — D-DIMER, QUANTITATIVE: D-Dimer, Quant: 6.2 ug/mL-FEU — ABNORMAL HIGH (ref 0.00–0.50)

## 2020-05-27 LAB — HEPATIC FUNCTION PANEL
ALT: 18 U/L (ref 0–44)
AST: 23 U/L (ref 15–41)
Albumin: 2.3 g/dL — ABNORMAL LOW (ref 3.5–5.0)
Alkaline Phosphatase: 91 U/L (ref 38–126)
Bilirubin, Direct: 0.3 mg/dL — ABNORMAL HIGH (ref 0.0–0.2)
Indirect Bilirubin: 0.6 mg/dL (ref 0.3–0.9)
Total Bilirubin: 0.9 mg/dL (ref 0.3–1.2)
Total Protein: 5.9 g/dL — ABNORMAL LOW (ref 6.5–8.1)

## 2020-05-27 LAB — C DIFFICILE QUICK SCREEN W PCR REFLEX
C Diff antigen: POSITIVE — AB
C Diff interpretation: DETECTED
C Diff toxin: POSITIVE — AB

## 2020-05-27 LAB — TROPONIN I (HIGH SENSITIVITY)
Troponin I (High Sensitivity): 24 ng/L — ABNORMAL HIGH (ref <18)
Troponin I (High Sensitivity): 25 ng/L — ABNORMAL HIGH (ref <18)
Troponin I (High Sensitivity): 25 ng/L — ABNORMAL HIGH (ref <18)

## 2020-05-27 LAB — TSH: TSH: 0.536 u[IU]/mL (ref 0.350–4.500)

## 2020-05-27 LAB — CREATININE, SERUM
Creatinine, Ser: 1.13 mg/dL (ref 0.61–1.24)
GFR, Estimated: >60 mL/min (ref >60)

## 2020-05-27 LAB — PROCALCITONIN: Procalcitonin: <0.1 ng/mL

## 2020-05-27 LAB — C-REACTIVE PROTEIN: CRP: 10.1 mg/dL — ABNORMAL HIGH (ref <1.0)

## 2020-05-27 MED ORDER — SODIUM CHLORIDE 0.9 % IV SOLN: INTRAVENOUS | Status: DC

## 2020-05-27 MED ORDER — MORPHINE SULFATE (PF) 2 MG/ML IV SOLN
0.5000 mg | INTRAVENOUS | Status: DC | PRN
  Administered 2020-05-27 – 2020-05-29 (×4): 0.5 mg via INTRAVENOUS
  Filled 2020-05-27 (×4): qty 1

## 2020-05-27 MED ORDER — VANCOMYCIN 50 MG/ML ORAL SOLUTION
125.0000 mg | Freq: Four times a day (QID) | ORAL | Status: DC
  Administered 2020-05-27 – 2020-06-03 (×30): 125 mg via ORAL
  Filled 2020-05-27 (×33): qty 2.5

## 2020-05-27 MED ORDER — PIPERACILLIN-TAZOBACTAM 3.375 G IVPB
3.3750 g | Freq: Three times a day (TID) | INTRAVENOUS | Status: DC
  Administered 2020-05-27: 3.375 g via INTRAVENOUS
  Filled 2020-05-27: qty 50

## 2020-05-27 MED ORDER — ONDANSETRON HCL 4 MG/2ML IJ SOLN
4.0000 mg | Freq: Once | INTRAMUSCULAR | Status: AC
  Administered 2020-05-27: 4 mg via INTRAVENOUS
  Filled 2020-05-27: qty 2

## 2020-05-27 MED ORDER — PROMETHAZINE HCL 25 MG/ML IJ SOLN
12.5000 mg | Freq: Four times a day (QID) | INTRAMUSCULAR | Status: DC | PRN
  Administered 2020-05-27 – 2020-05-30 (×5): 12.5 mg via INTRAVENOUS
  Filled 2020-05-27 (×5): qty 1

## 2020-05-27 MED ORDER — ASPIRIN EC 81 MG PO TBEC
81.0000 mg | DELAYED_RELEASE_TABLET | Freq: Every day | ORAL | Status: DC
  Administered 2020-05-27 – 2020-06-03 (×8): 81 mg via ORAL
  Filled 2020-05-27 (×8): qty 1

## 2020-05-27 MED ORDER — FERROUS SULFATE 325 (65 FE) MG PO TABS
325.0000 mg | ORAL_TABLET | Freq: Every day | ORAL | Status: DC
  Administered 2020-05-27 – 2020-06-03 (×8): 325 mg via ORAL
  Filled 2020-05-27 (×8): qty 1

## 2020-05-27 MED ORDER — EZETIMIBE 10 MG PO TABS
10.0000 mg | ORAL_TABLET | Freq: Every day | ORAL | Status: DC
Start: 2020-05-27 — End: 2020-06-03
  Administered 2020-05-27 – 2020-06-03 (×8): 10 mg via ORAL
  Filled 2020-05-27 (×8): qty 1

## 2020-05-27 MED ORDER — LEVOTHYROXINE SODIUM 25 MCG PO TABS
125.0000 ug | ORAL_TABLET | Freq: Every day | ORAL | Status: DC
  Administered 2020-05-27 – 2020-06-03 (×8): 125 ug via ORAL
  Filled 2020-05-27 (×9): qty 1

## 2020-05-27 MED ORDER — MORPHINE SULFATE (PF) 2 MG/ML IV SOLN
0.5000 mg | Freq: Once | INTRAVENOUS | Status: AC
Start: 2020-05-27 — End: 2020-05-27
  Administered 2020-05-27: 0.5 mg via INTRAVENOUS
  Filled 2020-05-27: qty 1

## 2020-05-27 MED ORDER — PANTOPRAZOLE SODIUM 40 MG PO TBEC
40.0000 mg | DELAYED_RELEASE_TABLET | Freq: Every day | ORAL | Status: DC
  Administered 2020-05-27: 40 mg via ORAL
  Filled 2020-05-27: qty 1

## 2020-05-27 MED ORDER — FAMOTIDINE 20 MG PO TABS
20.0000 mg | ORAL_TABLET | Freq: Every day | ORAL | Status: DC
  Administered 2020-05-27 – 2020-06-03 (×8): 20 mg via ORAL
  Filled 2020-05-27 (×8): qty 1

## 2020-05-27 MED ORDER — ENOXAPARIN SODIUM 40 MG/0.4ML ~~LOC~~ SOLN
40.0000 mg | SUBCUTANEOUS | Status: DC
  Administered 2020-05-27 – 2020-06-03 (×8): 40 mg via SUBCUTANEOUS
  Filled 2020-05-27 (×8): qty 0.4

## 2020-05-27 MED ORDER — ISOSORBIDE MONONITRATE ER 30 MG PO TB24
30.0000 mg | ORAL_TABLET | Freq: Every day | ORAL | Status: DC
  Administered 2020-05-27 – 2020-06-03 (×8): 30 mg via ORAL
  Filled 2020-05-27 (×8): qty 1

## 2020-05-27 NOTE — ED Notes (Signed)
Meal tray placed at bedside.

## 2020-05-27 NOTE — Progress Notes (Signed)
Pharmacy Antibiotic Note  Gary Gonzalez is a 87 y.o. male admitted on 05/26/2020 with hx cholecytectomy ~ 2 weeks ago, and subsequent re-admission with diverticulitis and covid, presents via EMS with generalized weakness Pharmacy has been consulted for zosyn dosing for intra-abdominal infection.  Plan: Zosyn 3.375g IV Q8H infused over 4hrs. Follow renal function and clinical course   Height: 6' (182.9 cm) Weight: 71 kg (156 lb 8.4 oz) IBW/kg (Calculated) : 77.6  Temp (24hrs), Avg:98.5 F (36.9 C), Min:98.5 F (36.9 C), Max:98.5 F (36.9 C)  Recent Labs  Lab 05/26/20 1700  WBC 19.2*  CREATININE 1.20    Estimated Creatinine Clearance: 40.3 mL/min (by C-G formula based on SCr of 1.2 mg/dL).    Allergies  Allergen Reactions  . Atenolol Diarrhea and Other (See Comments)    Very weak, fatigued, lethargic, no appetite    Thank you for allowing pharmacy to be a part of this patient's care.  Dolly Rias RPh 05/27/2020, 12:49 AM

## 2020-05-27 NOTE — ED Notes (Signed)
Pt meal tray placed at bedside per family request.

## 2020-05-27 NOTE — H&P (Signed)
History and Physical    Gary Gonzalez [id number] DOB: 1927-01-08 DOA: 05/26/2020  PCP: [name], MD  Patient coming from: Home.  Chief Complaint: Weakness.  HPI: Gary Gonzalez is a 87 y.o. 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 [name] cardiologist recently admitted for cholecystectomy following which patient also was readmitted again for diverticulitis discharged home on May 16, 2020 and was taking oral antibiotics until 4 days ago.  After completion of patient antibiotic course patient started having fever chills and gets weak.  This weakness were temporary but yesterday the weakness became more profound and was unable to walk was brought to the ER.  Patient also was recently Covid positive was given 3 days of remdesivir.  ED Course: Initial blood pressure in the ER was low normal was improved with fluids.  Lab work showed leukocytosis.  As per the patient's daughter patient was febrile at home.  Was given Tylenol.  No nausea vomiting or diarrhea.  CT abdomen pelvis shows postoperative changes around the gallbladder fossa with improving fluid in gas.  And there is also thickening of the sigmoid colon and cannot rule out any acute sigmoid diverticulitis.  Honeycombing of the lung seen.  Chest x-ray was unremarkable.  Had blood cultures drawn and started on Zosyn admitted for generalized weakness cause not clear.  High sensitive troponin was 21.  Review of Systems: As per HPI, rest all negative.   Past Medical History:  Diagnosis Date  . Anemia   . Arthritis    "touch in the fingers" (11/17/2014)  . Chronic combined systolic and diastolic CHF (congestive heart failure) ([place])    a. 11/17/14 showed EF 25-30%, suboptimal image quality, diffuse hypokinesis worse in the inferior wall, mild to moderate MR. New drop EF compared to 2012 but cath was stable.  . CKD (chronic kidney  disease), stage III ([place])   . Coronary artery disease    a. s/p 2v CABG (seq LIMA to Diag and LAD, SVG to OM) on 05/15/2001.  . Fibromyalgia    "a long time ago" (11/17/2014)  . History of gout   . Hyperlipidemia   . Hypothyroidism   . LBBB (left bundle branch block)   . Melanoma of back ([place])   . NSVT (nonsustained ventricular tachycardia) ([place])   . PMR (polymyalgia rheumatica) ([facility])   . PVC's (premature ventricular contractions)   . Sleep apnea    "wife says I do" (11/18/2014)    Past Surgical History:  Procedure Laterality Date  . AXILLARY LYMPH NODE DISSECTION Bilateral 2001  . CARDIAC CATHETERIZATION  04/28/2001  . CARDIAC CATHETERIZATION N/A 11/19/2014   Procedure: Right/Left Heart Cath and Coronary/Graft Angiography;  Surgeon: Jettie Booze, MD;  Location: [facility];  Service: Cardiovascular;  Laterality: N/A;  . CATARACT EXTRACTION W/ INTRAOCULAR LENS  IMPLANT, BILATERAL Bilateral ~ 2010  . CHOLECYSTECTOMY N/A 05/10/2020   Procedure: LAPAROSCOPIC CHOLECYSTECTOMY WITH INTRAOPERATIVE CHOLANGIOGRAM;  Surgeon: Coralie Keens, MD;  Location: [facility];  Service: General;  Laterality: N/A;  . CORONARY ARTERY BYPASS GRAFT  05/15/2001   x3 sequential LIMA to diag and LAD, SVG to OM   . ERCP N/A 05/11/2020   Procedure: ENDOSCOPIC RETROGRADE CHOLANGIOPANCREATOGRAPHY (ERCP);  Surgeon: Clarene Essex, MD;  Location: [facility];  Service: Endoscopy;  Laterality: N/A;  . MELANOMA EXCISION  2001   off of back  . REMOVAL OF STONES  05/11/2020   Procedure: REMOVAL OF STONES;  Surgeon: Clarene Essex, MD;  Location: [facility];  Service: Endoscopy;;  . Joan Mayans  05/11/2020   Procedure: Joan Mayans;  Surgeon: Clarene Essex, MD;  Location: [facility];  Service: Endoscopy;;     reports that he has never smoked. He has never used smokeless tobacco. He reports current alcohol use of about 6.0 standard drinks of alcohol per week. He reports that he does not use drugs.  Allergies   Allergen Reactions  . Atenolol Diarrhea and Other (See Comments)    Very weak, fatigued, lethargic, no appetite    Family History  Problem Relation Age of Onset  . Prostate cancer Father   . Liver cancer Mother   . Hyperlipidemia Son   . Hyperlipidemia Daughter     Prior to Admission medications   Medication Sig Start Date End Date Taking? Authorizing Provider  aspirin 81 MG tablet Take 81 mg by mouth daily.   Yes [provider]  Evolocumab (REPATHA SURECLICK) [id number] MG/ML SOAJ Inject 140 mg into the skin every 28 (twenty-eight) days.   Yes [provider]  ezetimibe (ZETIA) 10 MG tablet Take 10 mg by mouth daily.   Yes [provider]  ferrous sulfate 325 (65 FE) MG tablet Take 1 tablet (325 mg total) by mouth daily. 05/16/20 05/16/21 Yes Elodia Florence., MD  isosorbide mononitrate (IMDUR) 30 MG 24 hr tablet Take 30 mg by mouth daily.   Yes [provider]  levothyroxine (SYNTHROID, LEVOTHROID) 125 MCG tablet Take 1 tablet by mouth daily before breakfast. 04/13/16  Yes [provider]  mirtazapine (REMERON) 15 MG tablet Take 15 mg by mouth at bedtime.   Yes [provider]  omeprazole (PRILOSEC) 20 MG capsule Take 20 mg by mouth daily.   Yes [provider]  loperamide (IMODIUM) 2 MG capsule Take 1 capsule (2 mg total) by mouth as needed for diarrhea or loose stools. Patient not taking: Reported on 05/26/2020 05/16/20   Elodia Florence., MD  nitroGLYCERIN (NITROSTAT) 0.4 MG SL tablet Place 1 tablet (0.4 mg total) under the tongue every 5 (five) minutes as needed for chest pain. 07/30/19 10/28/19  Shirley Friar, PA-C  nitroGLYCERIN (NITROSTAT) 0.4 MG SL tablet Place 0.4 mg under the tongue every 5 (five) minutes as needed for chest pain.    [provider]    Physical Exam: Constitutional: Moderately built and nourished. Vitals:   05/26/20 1845 05/26/20 1945 05/26/20 2030 05/26/20 2330  BP: (!)  118/50 (!) 140/56 (!) 124/55 (!) 116/48  Pulse: 94 87 94 92  Resp: 19 (!) 25 (!) 31 19  Temp:      TempSrc:      SpO2: 96% 93% 99% 97%  Weight:      Height:       Eyes: Anicteric no pallor. ENMT: No discharge from the ears eyes nose or mouth. Neck: No mass felt.  No neck rigidity. Respiratory: No rhonchi or crepitations. Cardiovascular: S1-S2 heard. Abdomen: Soft nontender bowel sounds present. Musculoskeletal: No edema. Skin: No rash. Neurologic: Patient is sleeping but oriented to his place and name moving all extremities generally weak no definite focal signs.  Poor dependent reflexes. Psychiatric: Patient is sleeping but easily arousable and answers questions.   Labs on Admission: I have personally reviewed following labs and imaging studies  CBC: Recent Labs  Lab 05/26/20 1700  WBC 19.2*  HGB 10.1*  HCT 31.4*  MCV 92.4  PLT 387  Basic Metabolic Panel: Recent Labs  Lab 05/26/20 1700  NA 136  K 4.0  CL 99  CO2 25  GLUCOSE 128*  BUN 20  CREATININE 1.20  CALCIUM 8.3*   GFR: Estimated Creatinine Clearance: 40.3 mL/min (by C-G formula based on SCr of 1.2 mg/dL). Liver Function Tests: Recent Labs  Lab 05/26/20 1700  AST 24  ALT 22  ALKPHOS 115  BILITOT 0.5  PROT 6.5  ALBUMIN 2.8*   No results for input(s): LIPASE, AMYLASE in the last 168 hours. No results for input(s): AMMONIA in the last 168 hours. Coagulation Profile: No results for input(s): INR, PROTIME in the last 168 hours. Cardiac Enzymes: No results for input(s): CKTOTAL, CKMB, CKMBINDEX, TROPONINI in the last 168 hours. BNP (last 3 results) No results for input(s): PROBNP in the last 8760 hours. HbA1C: No results for input(s): HGBA1C in the last 72 hours. CBG: No results for input(s): GLUCAP in the last 168 hours. Lipid Profile: No results for input(s): CHOL, HDL, LDLCALC, TRIG, CHOLHDL, LDLDIRECT in the last 72 hours. Thyroid Function Tests: Recent Labs    05/26/20 1700  TSH 0.546    Anemia Panel: No results for input(s): VITAMINB12, FOLATE, FERRITIN, TIBC, IRON, RETICCTPCT in the last 72 hours. Urine analysis:    Component Value Date/Time   COLORURINE YELLOW 05/13/2020 2044   APPEARANCEUR CLEAR 05/13/2020 2044   LABSPEC 1.028 05/13/2020 2044   PHURINE 5.0 05/13/2020 2044   GLUCOSEU NEGATIVE 05/13/2020 2044   HGBUR NEGATIVE 05/13/2020 2044   BILIRUBINUR NEGATIVE 05/13/2020 2044   KETONESUR NEGATIVE 05/13/2020 2044   PROTEINUR NEGATIVE 05/13/2020 2044   NITRITE NEGATIVE 05/13/2020 2044   LEUKOCYTESUR NEGATIVE 05/13/2020 2044   Sepsis Labs: @LABRCNTIP (procalcitonin:4,lacticidven:4) )No results found for this or any previous visit (from the past 240 hour(s)).   Radiological Exams on Admission: CT Abdomen Pelvis W Contrast  Result Date: 05/26/2020 CLINICAL DATA:  Acute abdominal pain. Increased white cell count and weakness. Recent cholecystectomy. EXAM: CT ABDOMEN AND PELVIS WITH CONTRAST TECHNIQUE: Multidetector CT imaging of the abdomen and pelvis was performed using the standard protocol following bolus administration of intravenous contrast. CONTRAST:  118mL OMNIPAQUE IOHEXOL 300 MG/ML  SOLN COMPARISON:  05/13/2020 FINDINGS: Lower chest: Peripheral fibrosis and mild honeycomb changes consistent with usual interstitial pneumonitis. Postoperative changes in the mediastinum. Normal heart size. Small esophageal hiatal hernia. Hepatobiliary: Postoperative cholecystectomy with residual fluid in the gallbladder fossa. Amount of fluid is decreased and the gas seen previously is diminished. Decreasing infiltration in the surrounding fat. Pneumobilia consistent with postoperative change. No focal liver lesions. Pancreas: Unremarkable. No pancreatic ductal dilatation or surrounding inflammatory changes. Spleen: Scattered calcified granulomas. Adrenals/Urinary Tract: Adrenal glands are unremarkable. Kidneys are normal, without renal calculi, focal lesion, or hydronephrosis.  Bladder is unremarkable. Stomach/Bowel: Stomach, small bowel, and colon are not abnormally distended. Stool fills the colon. Diverticulosis of the sigmoid colon with diffuse wall thickening. No change in appearance since previous study. Likely muscular hypertrophy but can not exclude early diverticulitis. No abscess. Appendix is normal. Vascular/Lymphatic: Aortic atherosclerosis. No enlarged abdominal or pelvic lymph nodes. Reproductive: Prostate is unremarkable. Other: No free air or free fluid in the abdomen. Scarring in the periumbilical region is likely postoperative. Abdominal wall musculature appears intact. Musculoskeletal: Degenerative changes in the spine and hips. Mild lumbar scoliosis convex towards the left. No destructive bone lesions. IMPRESSION: 1. Postoperative cholecystectomy with residual fluid in the gallbladder fossa. Amount of fluid is decreased and the gas seen previously is diminished. Decreasing infiltration in the  surrounding fat. 2. Diverticulosis of the sigmoid colon with diffuse wall thickening. Likely muscular hypertrophy but can not exclude early diverticulitis. No abscess. No change since prior study. 3. Peripheral fibrosis and mild honeycomb changes in the lungs consistent with usual interstitial pneumonitis. 4. Small esophageal hiatal hernia. 5. Aortic atherosclerosis. Aortic Atherosclerosis (ICD10-I70.0). Electronically Signed   By: Lucienne Capers M.D.   On: 05/26/2020 19:46   DG Chest Port 1 View  Result Date: 05/26/2020 CLINICAL DATA:  Generalized weakness, COVID-19 positive 10 days ago, history of melanoma EXAM: PORTABLE CHEST 1 VIEW COMPARISON:  10/29/2016 FINDINGS: Single frontal view of the chest demonstrates an unremarkable cardiac silhouette. No airspace disease, effusion, or pneumothorax. Postsurgical changes bilateral axilla. No acute bony abnormalities. IMPRESSION: 1. No acute intrathoracic process. Electronically Signed   By: Randa Ngo M.D.   On: 05/26/2020  17:19    EKG: Independently reviewed.  Normal sinus rhythm.  Assessment/Plan Principal Problem:   Generalized weakness Active Problems:   CAD (coronary artery disease)   PVC's (premature ventricular contractions)   S/P CABG x 2   CKD (chronic kidney disease), stage III ([facility])   Chronic combined systolic and diastolic CHF (congestive heart failure) ([place])   Hypothyroidism (acquired)    1. Generalized weakness with fever chills with leukocytosis cause not clear.  Blood cultures have been ordered along with inflammatory markers empiric Zosyn was started since CT scan was not ruling out sigmoid diverticulitis.  Did show some honeycombing in the CT scan of the abdomen for which I have ordered a CT chest.  Did receive fluids in the ER.  CT head is pending. 2. Recent Covid infection had received 3 days of remdesivir.  Patient is presently not hypoxic.  Will follow CT chest. 3. CAD status post CABG denies any chest pain will trend cardiac markers on aspirin Zetia Imdur. 4. History of PVC induced cardiomyopathy last EF in 2018 per cardiology notes have normalized.  Did have a grade 1 diastolic dysfunction. 5. Chronic anemia on iron supplements. 6. Hypothyroidism on Synthroid.  Check TSH.   DVT prophylaxis: Lovenox. Code Status: Full code. Family Communication: Patient's daughter. Disposition Plan: Home. Consults called: Physical therapy. Admission status: Observation.   Rise Patience MD [facility] Pager [phone].  If 7PM-7AM, please contact night-coverage [email or web address] Password [facility]  05/27/2020, 12:16 AM

## 2020-05-27 NOTE — Progress Notes (Signed)
  PROGRESS NOTE  Patient admitted earlier this morning. See H&P.   Gary Gonzalez is a 87 y.o. 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 [name] cardiologist, who was recently admitted for cholecystectomy following which patient also was readmitted again for diverticulitis and covid. He was discharged home on May 16, 2020 and was taking oral antibiotics until 4 days ago.  After completion of patient antibiotic course, patient started having fever, chills and got weak.   Patient seen in the ED with daughter at bedside.  She relays that patient had made seemingly a full recovery after last hospitalization.  He was fully independent, able to shower and eat.  However, over the past several days after finishing his antibiotics for diverticulitis, he became very progressively weak.  Also having diarrhea, 5 episodes, as well as nausea and vomiting.  He denies any abdominal pain.  A/P:   C. Difficile -Tested positive for C. difficile on admission 1/28.  Oral vancomycin started -CT abdomen pelvis revealed diverticulosis of the sigmoid colon with diffuse wall thickening, cannot exclude early diverticulitis and was empirically started on Zosyn, I will discontinue antibiotics for now and monitor on oral vancomycin -Continue to monitor leukocytosis closely -IV fluid -Full liquid diet  CAD status post CABG -Continue aspirin, Zetia, Imdur  Demand ischemia -Troponin trend has been flat 21 >> 21 >> 24 >> 25 >> 25 -Likely secondary to acute infection as above  History of PVC induced cardiomyopathy -Continue to monitor on telemetry  Hypothyroidism -Continue Synthroid  Recent Covid infection -Tested positive 1/15 -Completed 3 days of Remdesivir -Patient has no respiratory complaints, he is out of the isolation window -CRP elevated but lower than during previous hospitalization -D-dimer  elevated but also lower than during previous hospitalization -CT chest: Emphysematous changes and fibrosis in the lungs. Atelectasis in the lung bases. Mild peripheral bronchiectasis in the bases. No focal consolidation.  Status is: Observation  The patient will require care spanning > 2 midnights and should be moved to inpatient because: IV treatments appropriate due to intensity of illness or inability to take PO and Inpatient level of care appropriate due to severity of illness  Dispo: The patient is from: Home              Anticipated d/c is to: Home              Anticipated d/c date is: 2 days              Patient currently is not medically stable to d/c.   Difficult to place patient No       Dessa Phi, DO [facility] 05/27/2020, 12:27 PM  Available via Epic secure chat 7am-7pm After these hours, please refer to coverage provider listed on [email or web address]

## 2020-05-28 ENCOUNTER — Inpatient Hospital Stay (HOSPITAL_COMMUNITY): Payer: PPO

## 2020-05-28 DIAGNOSIS — U071 COVID-19: Secondary | ICD-10-CM

## 2020-05-28 DIAGNOSIS — Z9889 Other specified postprocedural states: Secondary | ICD-10-CM | POA: Diagnosis not present

## 2020-05-28 DIAGNOSIS — A0472 Enterocolitis due to Clostridium difficile, not specified as recurrent: Principal | ICD-10-CM

## 2020-05-28 DIAGNOSIS — R7989 Other specified abnormal findings of blood chemistry: Secondary | ICD-10-CM

## 2020-05-28 LAB — COMPREHENSIVE METABOLIC PANEL WITH GFR
ALT: 15 U/L (ref 0–44)
AST: 15 U/L (ref 15–41)
Albumin: 2.2 g/dL — ABNORMAL LOW (ref 3.5–5.0)
Alkaline Phosphatase: 76 U/L (ref 38–126)
Anion gap: 11 (ref 5–15)
BUN: 19 mg/dL (ref 8–23)
CO2: 22 mmol/L (ref 22–32)
Calcium: 7.5 mg/dL — ABNORMAL LOW (ref 8.9–10.3)
Chloride: 108 mmol/L (ref 98–111)
Creatinine, Ser: 1.27 mg/dL — ABNORMAL HIGH (ref 0.61–1.24)
GFR, Estimated: 53 mL/min — ABNORMAL LOW
Glucose, Bld: 96 mg/dL (ref 70–99)
Potassium: 3.3 mmol/L — ABNORMAL LOW (ref 3.5–5.1)
Sodium: 141 mmol/L (ref 135–145)
Total Bilirubin: 0.6 mg/dL (ref 0.3–1.2)
Total Protein: 5.1 g/dL — ABNORMAL LOW (ref 6.5–8.1)

## 2020-05-28 LAB — CBC WITH DIFFERENTIAL/PLATELET
Abs Immature Granulocytes: 0.11 K/uL — ABNORMAL HIGH (ref 0.00–0.07)
Basophils Absolute: 0 K/uL (ref 0.0–0.1)
Basophils Relative: 0 %
Eosinophils Absolute: 0 K/uL (ref 0.0–0.5)
Eosinophils Relative: 0 %
HCT: 25 % — ABNORMAL LOW (ref 39.0–52.0)
Hemoglobin: 7.9 g/dL — ABNORMAL LOW (ref 13.0–17.0)
Immature Granulocytes: 1 %
Lymphocytes Relative: 4 %
Lymphs Abs: 0.7 K/uL (ref 0.7–4.0)
MCH: 29.6 pg (ref 26.0–34.0)
MCHC: 31.6 g/dL (ref 30.0–36.0)
MCV: 93.6 fL (ref 80.0–100.0)
Monocytes Absolute: 1.3 K/uL — ABNORMAL HIGH (ref 0.1–1.0)
Monocytes Relative: 8 %
Neutro Abs: 14.8 K/uL — ABNORMAL HIGH (ref 1.7–7.7)
Neutrophils Relative %: 87 %
Platelets: 307 K/uL (ref 150–400)
RBC: 2.67 MIL/uL — ABNORMAL LOW (ref 4.22–5.81)
RDW: 13.9 % (ref 11.5–15.5)
WBC: 16.9 K/uL — ABNORMAL HIGH (ref 4.0–10.5)
nRBC: 0 % (ref 0.0–0.2)

## 2020-05-28 LAB — C-REACTIVE PROTEIN: CRP: 21.4 mg/dL — ABNORMAL HIGH

## 2020-05-28 LAB — D-DIMER, QUANTITATIVE: D-Dimer, Quant: 14.01 ug{FEU}/mL — ABNORMAL HIGH (ref 0.00–0.50)

## 2020-05-28 MED ORDER — LIP MEDEX EX OINT
TOPICAL_OINTMENT | CUTANEOUS | Status: DC | PRN
  Filled 2020-05-28 (×2): qty 7

## 2020-05-28 MED ORDER — POTASSIUM CHLORIDE CRYS ER 20 MEQ PO TBCR
40.0000 meq | EXTENDED_RELEASE_TABLET | Freq: Once | ORAL | Status: AC
  Administered 2020-05-28: 40 meq via ORAL
  Filled 2020-05-28: qty 2

## 2020-05-28 MED ORDER — IOHEXOL 350 MG/ML SOLN
100.0000 mL | Freq: Once | INTRAVENOUS | Status: AC | PRN
  Administered 2020-05-28: 100 mL via INTRAVENOUS

## 2020-05-28 NOTE — Plan of Care (Signed)
  Problem: Activity: Goal: Risk for activity intolerance will decrease Outcome: Progressing   Problem: Nutrition: Goal: Adequate nutrition will be maintained Outcome: Progressing   Problem: Elimination: Goal: Will not experience complications related to bowel motility Outcome: Progressing   Problem: Pain Managment: Goal: General experience of comfort will improve Outcome: Progressing   Problem: Skin Integrity: Goal: Risk for impaired skin integrity will decrease Outcome: Progressing  Patient's bowel movements are still frequent and uncontrollable, but the patient is having significantly smaller bowel movements. Patient has been able to slowly increase food intake. Pain is being managed well. Patient has only required one dose of 0.5mg  of Morphine.

## 2020-05-28 NOTE — Evaluation (Addendum)
Physical Therapy Evaluation Patient Details Name: Gary Gonzalez MRN: [id number] DOB: December 17, 1926 Today's Date: 05/28/2020   History of Present Illness  87 yo male admitted with C-diff, weakness. Hx of recent COVID, LBBB, CAD, CABG, PVC, NSVT, gout, fibromyalgia, sleep apnea  Clinical Impression  On eval, pt required Min assist for mobility. He will likely need +2 for safety for hallway ambulation. Pt presents with general weakness, decreased activity tolerance, and impaired gait and balance. He is very weak and fatigues quickly. He is motivated to get stronger. Discussed d/c plan with pt and family-they are unsure at this time. They did state they are leaning towards home with in home assistance. Pt may need 24 hour supervision/assist at discharge but this will be dependent on his progress.     Follow Up Recommendations SNF (vs HHPT-depending on pt/family decision and progress. They are leaning towards home as their preference.)    Equipment Recommendations  None recommended by PT    Recommendations for Other Services       Precautions / Restrictions Precautions Precautions: Fall Precaution Comments: bowel incontinence Restrictions Weight Bearing Restrictions: No      Mobility  Bed Mobility Overal bed mobility: Needs Assistance Bed Mobility: Supine to Sit     Supine to sit: Min assist;HOB elevated     General bed mobility comments: Increased time. Assist for trunk. Pt fatigued and had dizziness. Sat EOB for several minutes before attempting stand    Transfers Overall transfer level: Needs assistance Equipment used: Rolling walker (2 wheeled) Transfers: Sit to/from Stand Sit to Stand: Min assist;From elevated surface         General transfer comment: Assist to rise, steady, control descent. Cues for hand placement.  Ambulation/Gait Ambulation/Gait assistance: Min assist; +2 safety/equipment Gait Distance (Feet): 3 Feet Assistive device: Rolling walker (2  wheeled) Gait Pattern/deviations: Step-through pattern;Decreased stride length     General Gait Details: Assist to stabilize pt. Very unsteady and fatigues very quickly. Very weak.  Stairs            Wheelchair Mobility    Modified Rankin (Stroke Patients Only)       Balance Overall balance assessment: Needs assistance         Standing balance support: Bilateral upper extremity supported Standing balance-Leahy Scale: Poor                               Pertinent Vitals/Pain Pain Assessment: No/denies pain    Home Living Family/patient expects to be discharged to:: Private residence Living Arrangements: Spouse/significant other Available Help at Discharge: Family Type of Home: House Home Access: Stairs to enter   Technical brewer of Steps: 1 Home Layout: One level Home Equipment: Walker - 4 wheels Additional Comments: pt uses a RW due to balance issues at baseline. Other than that, pt is independent at baseline    Prior Function Level of Independence: Independent with assistive device(s)         Comments: with rollator     Hand Dominance        Extremity/Trunk Assessment   Upper Extremity Assessment Upper Extremity Assessment: Defer to OT evaluation    Lower Extremity Assessment Lower Extremity Assessment: Generalized weakness    Cervical / Trunk Assessment Cervical / Trunk Assessment: Normal  Communication   Communication: No difficulties  Cognition Arousal/Alertness: Awake/alert Behavior During Therapy: WFL for tasks assessed/performed Overall Cognitive Status: Within Functional Limits for tasks assessed  General Comments      Exercises Exercises Ankle Circles/Pumps: AROM;Both;5 reps Quad Sets: AROM;Both;5 reps Straight Leg Raises: AROM;Both;5 reps   Assessment/Plan    PT Assessment Patient needs continued PT services  PT Problem List Decreased  strength;Decreased mobility;Decreased activity tolerance;Decreased balance;Pain       PT Treatment Interventions DME instruction;Gait training;Therapeutic activities;Therapeutic exercise;Patient/family education;Balance training;Functional mobility training    PT Goals (Current goals can be found in the Care Plan section)  Acute Rehab PT Goals Patient Stated Goal: to get stronger PT Goal Formulation: With patient/family Time For Goal Achievement: 06/11/20 Potential to Achieve Goals: Good    Frequency Min 3X/week   Barriers to discharge        Co-evaluation               AM-PAC PT "6 Clicks" Mobility  Outcome Measure Help needed turning from your back to your side while in a flat bed without using bedrails?: A Little Help needed moving from lying on your back to sitting on the side of a flat bed without using bedrails?: A Little Help needed moving to and from a bed to a chair (including a wheelchair)?: A Little Help needed standing up from a chair using your arms (e.g., wheelchair or bedside chair)?: A Little Help needed to walk in hospital room?: A Little Help needed climbing 3-5 steps with a railing? : A Lot 6 Click Score: 17    End of Session Equipment Utilized During Treatment: Gait belt Activity Tolerance: Patient limited by fatigue Patient left: in chair;with call bell/phone within reach;with family/visitor present   PT Visit Diagnosis: Muscle weakness (generalized) (M62.81);Unsteadiness on feet (R26.81)    Time: 3810-1751 PT Time Calculation (min) (ACUTE ONLY): 28 min   Charges:   PT Evaluation $PT Eval Moderate Complexity: 1 Mod PT Treatments $Gait Training: 8-22 mins           Doreatha Massed, PT Acute Rehabilitation  Office: [phone] Pager: [phone]

## 2020-05-28 NOTE — Progress Notes (Signed)
PROGRESS NOTE    Gary Gonzalez  [id number] DOB: [birth year] DOA: 05/26/2020 PCP: [name], MD     Brief Narrative:  Bun Rinne Ryanis a 87 y.o.malewithhistory of CAD status post CABG, PVC induced cardiomyopathy last EF in 2018 was normalized with grade 1 diastolic dysfunction follows with Dr. [name] cardiologist, who was recently admitted for cholecystectomy following which patient also was readmitted again for diverticulitis and covid. He was discharged home on May 16, 2020 and was taking oral antibiotics until 4 days ago. After completion of patient antibiotic course, patient started having fever, chills and got weak. He tested positive for C Diff on admission.   New events last 24 hours / Subjective: Patient much more interactive today. Having loose stools, 6 times, often with RLQ cramping and also with some urgency, incontinence. Fever early this morning 100.4. He denies any shortness of breath, chest pain or peripheral edema.  Assessment & Plan:   Principal Problem:   C. difficile diarrhea Active Problems:   CAD (coronary artery disease)   PVC's (premature ventricular contractions)   S/P CABG x 2   CKD (chronic kidney disease), stage III (HCC)   Chronic combined systolic and diastolic CHF (congestive heart failure) (HCC)   Hypothyroidism (acquired)   Generalized weakness   C. Difficile -Tested positive for C. difficile on admission 1/28.  Oral vancomycin started 1/28  -CT abdomen pelvis revealed diverticulosis of the sigmoid colon with diffuse wall thickening, cannot exclude early diverticulitis and was empirically started on Zosyn, I will discontinue antibiotics for now and monitor on oral vancomycin -Continue to monitor leukocytosis closely - trending down  -IV fluid -Advance diet   CAD status post CABG -Continue aspirin, Zetia, Imdur  Demand ischemia -Troponin trend has been flat 21 >> 21 >> 24 >> 25 >> 25 -Likely secondary to acute infection as above -No  complaints of chest pain   History of PVC induced cardiomyopathy -Continue to monitor on telemetry  Hypothyroidism -Continue Synthroid  Recent Covid infection -Tested positive 1/15 -Completed 3 days of Remdesivir -Patient has no respiratory complaints, he is out of the isolation window -CT chest: Emphysematous changes and fibrosis in the lungs. Atelectasis in the lung bases. Mild peripheral bronchiectasis in the bases. No focal consolidation.  Elevated d-dimer -Had trended down after COVID but now trended up. He has had 3 hospitalizations, with recent surgery. Need to rule out VTE. Ordered CTA chest and venous duplex  Hypokalemia -Replace, trend   In agreement with assessment of the pressure ulcer as below:  Pressure Injury 05/27/20 Sacrum Medial;Lower  blanchable; moisture associated (Active)  05/27/20 2300  Location: Sacrum  Location Orientation: Medial;Lower  Staging:   Wound Description (Comments): blanchable; moisture associated  Present on Admission:          DVT prophylaxis:  enoxaparin (LOVENOX) injection 40 mg Start: 05/27/20 1000  Code Status: Full Family Communication: Daughter at bedside  Disposition Plan:  Status is: Inpatient  Remains inpatient appropriate because:Inpatient level of care appropriate due to severity of illness   Dispo: The patient is from: Home              Anticipated d/c is to: Home              Anticipated d/c date is: 3 days              Patient currently is not medically stable to d/c.   Difficult to place patient No   Antimicrobials:  Anti-infectives (From admission, onward)  Start     Dose/Rate Route Frequency Ordered Stop   05/27/20 1100  vancomycin (VANCOCIN) 50 mg/mL oral solution 125 mg        125 mg Oral 4 times daily 05/27/20 1037 06/06/20 0959   05/27/20 0600  piperacillin-tazobactam (ZOSYN) IVPB 3.375 g  Status:  Discontinued        3.375 g 12.5 mL/hr over 240 Minutes Intravenous Every 8 hours 05/27/20 0026  05/27/20 1037   05/26/20 2100  piperacillin-tazobactam (ZOSYN) IVPB 3.375 g        3.375 g 100 mL/hr over 30 Minutes Intravenous  Once 05/26/20 2058 05/26/20 2255        Objective: Vitals:   05/28/20 0123 05/28/20 0644 05/28/20 0645 05/28/20 0942  BP: (!) 130/53  (!) 117/48 (!) 125/50  Pulse: 100 94 90 86  Resp: 18 18  16   Temp: 99.6 F (37.6 C) (!) 100.4 F (38 C)  100 F (37.8 C)  TempSrc: Oral Oral  Oral  SpO2: 94% 92%  93%  Weight:      Height:        Intake/Output Summary (Last 24 hours) at 05/28/2020 1219 Last data filed at 05/28/2020 1113 Gross per 24 hour  Intake 2281.16 ml  Output 770 ml  Net 1511.16 ml   Filed Weights   05/26/20 1606  Weight: 71 kg    Examination:  General exam: Appears calm and comfortable  Respiratory system: Clear to auscultation. Respiratory effort normal. No respiratory distress. No conversational dyspnea.  Cardiovascular system: S1 & S2 heard, RRR. No murmurs. No pedal edema. Gastrointestinal system: Abdomen is nondistended, soft and nontender to palpation. Normal bowel sounds heard. Central nervous system: Alert and oriented. No focal neurological deficits. Speech clear.  Extremities: Symmetric in appearance  Skin: No rashes, lesions or ulcers on exposed skin  Psychiatry: Judgement and insight appear normal. Mood & affect appropriate.   Data Reviewed: I have personally reviewed following labs and imaging studies  CBC: Recent Labs  Lab 05/26/20 1700 05/27/20 0834 05/28/20 0545  WBC 19.2* 25.9* 16.9*  NEUTROABS  --  23.3* 14.8*  HGB 10.1* 9.5* 7.9*  HCT 31.4* 29.2* 25.0*  MCV 92.4 92.1 93.6  PLT 387 342 [id number]   Basic Metabolic Panel: Recent Labs  Lab 05/26/20 1700 05/27/20 0014 05/27/20 0834 05/28/20 0545  NA 136  --  136 141  K 4.0  --  4.1 3.3*  CL 99  --  104 108  CO2 25  --  24 22  GLUCOSE 128*  --  120* 96  BUN 20  --  16 19  CREATININE 1.20 1.13 1.09 1.27*  CALCIUM 8.3*  --  7.8* 7.5*   GFR: Estimated  Creatinine Clearance: 38 mL/min (A) (by C-G formula based on SCr of 1.27 mg/dL (H)). Liver Function Tests: Recent Labs  Lab 05/26/20 1700 05/27/20 0834 05/28/20 0545  AST 24 23 15   ALT 22 18 15   ALKPHOS 115 91 76  BILITOT 0.5 0.9 0.6  PROT 6.5 5.9* 5.1*  ALBUMIN 2.8* 2.3* 2.2*   No results for input(s): LIPASE, AMYLASE in the last 168 hours. No results for input(s): AMMONIA in the last 168 hours. Coagulation Profile: No results for input(s): INR, PROTIME in the last 168 hours. Cardiac Enzymes: No results for input(s): CKTOTAL, CKMB, CKMBINDEX, TROPONINI in the last 168 hours. BNP (last 3 results) No results for input(s): PROBNP in the last 8760 hours. HbA1C: No results for input(s): HGBA1C in the last 72 hours. CBG:  No results for input(s): GLUCAP in the last 168 hours. Lipid Profile: No results for input(s): CHOL, HDL, LDLCALC, TRIG, CHOLHDL, LDLDIRECT in the last 72 hours. Thyroid Function Tests: Recent Labs    05/27/20 0834  TSH 0.536   Anemia Panel: No results for input(s): VITAMINB12, FOLATE, FERRITIN, TIBC, IRON, RETICCTPCT in the last 72 hours. Sepsis Labs: Recent Labs  Lab 05/27/20 0010  PROCALCITON <0.10    Recent Results (from the past 240 hour(s))  C Difficile Quick Screen w PCR reflex     Status: Abnormal   Collection Time: 05/27/20  9:06 AM   Specimen: STOOL  Result Value Ref Range Status   C Diff antigen POSITIVE (A) NEGATIVE Final   C Diff toxin POSITIVE (A) NEGATIVE Final   C Diff interpretation Toxin producing C. difficile detected.  Final    Comment: CRITICAL RESULT CALLED TO, READ BACK BY AND VERIFIED WITH: BANNO,A @ 1009 ON [id number] BY POTEAT,S Performed at [facility] [address], [place], [place] [address]   Gastrointestinal Panel by PCR , Stool     Status: None   Collection Time: 05/27/20  9:06 AM   Specimen: STOOL  Result Value Ref Range Status   Campylobacter species NOT DETECTED NOT DETECTED Final   Plesimonas  shigelloides NOT DETECTED NOT DETECTED Final   Salmonella species NOT DETECTED NOT DETECTED Final   Yersinia enterocolitica NOT DETECTED NOT DETECTED Final   Vibrio species NOT DETECTED NOT DETECTED Final   Vibrio cholerae NOT DETECTED NOT DETECTED Final   Enteroaggregative E coli (EAEC) NOT DETECTED NOT DETECTED Final   Enteropathogenic E coli (EPEC) NOT DETECTED NOT DETECTED Final   Enterotoxigenic E coli (ETEC) NOT DETECTED NOT DETECTED Final   Shiga like toxin producing E coli (STEC) NOT DETECTED NOT DETECTED Final   Shigella/Enteroinvasive E coli (EIEC) NOT DETECTED NOT DETECTED Final   Cryptosporidium NOT DETECTED NOT DETECTED Final   Cyclospora cayetanensis NOT DETECTED NOT DETECTED Final   Entamoeba histolytica NOT DETECTED NOT DETECTED Final   Giardia lamblia NOT DETECTED NOT DETECTED Final   Adenovirus F40/41 NOT DETECTED NOT DETECTED Final   Astrovirus NOT DETECTED NOT DETECTED Final   Norovirus GI/GII NOT DETECTED NOT DETECTED Final   Rotavirus A NOT DETECTED NOT DETECTED Final   Sapovirus (I, II, IV, and V) NOT DETECTED NOT DETECTED Final    Comment: Performed at [facility], [address], [place], [place] [address]      Radiology Studies: CT HEAD WO CONTRAST  Result Date: 05/27/2020 CLINICAL DATA:  Altered mental status, generalized weakness, COVID positive EXAM: CT HEAD WITHOUT CONTRAST TECHNIQUE: Contiguous axial images were obtained from the base of the skull through the vertex without intravenous contrast. COMPARISON:  None. FINDINGS: Brain: Normal anatomic configuration. Minimal parenchymal volume loss. Mild periventricular white matter changes are present likely reflecting the sequela of small vessel ischemia. No abnormal intra or extra-axial mass lesion or fluid collection. No abnormal mass effect or midline shift. No evidence of acute intracranial hemorrhage or infarct. Ventricular size is normal. Cerebellum unremarkable. Vascular: No asymmetric hyperdense  vasculature at the skull base. Skull: Intact Sinuses/Orbits: There is mild mucosal thickening within the right maxillary and left sphenoid sinuses. No air-fluid levels. Remaining paranasal sinuses are clear. Orbits are unremarkable. Other: Mastoid air cells and middle ear cavities are clear. IMPRESSION: No acute intracranial hemorrhage or infarct. Mild paranasal sinus disease. Electronically Signed   By: Fidela Salisbury MD   On: 05/27/2020 02:30  CT CHEST WO CONTRAST  Result Date: 05/27/2020 CLINICAL DATA:  Persistent cough and weakness. Recent cholecystectomy. COVID positive 05/14/2020. EXAM: CT CHEST WITHOUT CONTRAST TECHNIQUE: Multidetector CT imaging of the chest was performed following the standard protocol without IV contrast. COMPARISON:  Chest radiograph 05/26/2020 FINDINGS: Cardiovascular: Normal heart size. No pericardial effusions. Postoperative coronary bypass. Normal caliber thoracic aorta. Scattered aortic calcification. Mediastinum/Nodes: Esophagus is decompressed. Calcified mediastinal lymph nodes. Mediastinal lymph nodes are not pathologically enlarged. Lungs/Pleura: Emphysematous changes in the lungs. Mild peripheral fibrosis. Atelectasis in the lung bases. No pleural effusions. No pneumothorax. Mild peripheral bronchiectasis in the bases. Upper Abdomen: No acute abnormalities demonstrated in the visualized upper abdomen. Pneumobilia is likely postoperative. Musculoskeletal: Sternotomy wires. Mild degenerative changes in the spine. No destructive bone lesions. Surgical clips in the axilla. IMPRESSION: 1. Emphysematous changes and fibrosis in the lungs. Atelectasis in the lung bases. Mild peripheral bronchiectasis in the bases. No focal consolidation. 2. Postoperative coronary bypass. 3. Pneumobilia is likely postoperative. 4. Emphysema and aortic atherosclerosis. Aortic Atherosclerosis (ICD10-I70.0) and Emphysema (ICD10-J43.9). Electronically Signed   By: Lucienne Capers M.D.   On: 05/27/2020  02:42   CT Abdomen Pelvis W Contrast  Result Date: 05/26/2020 CLINICAL DATA:  Acute abdominal pain. Increased white cell count and weakness. Recent cholecystectomy. EXAM: CT ABDOMEN AND PELVIS WITH CONTRAST TECHNIQUE: Multidetector CT imaging of the abdomen and pelvis was performed using the standard protocol following bolus administration of intravenous contrast. CONTRAST:  182mL OMNIPAQUE IOHEXOL 300 MG/ML  SOLN COMPARISON:  05/13/2020 FINDINGS: Lower chest: Peripheral fibrosis and mild honeycomb changes consistent with usual interstitial pneumonitis. Postoperative changes in the mediastinum. Normal heart size. Small esophageal hiatal hernia. Hepatobiliary: Postoperative cholecystectomy with residual fluid in the gallbladder fossa. Amount of fluid is decreased and the gas seen previously is diminished. Decreasing infiltration in the surrounding fat. Pneumobilia consistent with postoperative change. No focal liver lesions. Pancreas: Unremarkable. No pancreatic ductal dilatation or surrounding inflammatory changes. Spleen: Scattered calcified granulomas. Adrenals/Urinary Tract: Adrenal glands are unremarkable. Kidneys are normal, without renal calculi, focal lesion, or hydronephrosis. Bladder is unremarkable. Stomach/Bowel: Stomach, small bowel, and colon are not abnormally distended. Stool fills the colon. Diverticulosis of the sigmoid colon with diffuse wall thickening. No change in appearance since previous study. Likely muscular hypertrophy but can not exclude early diverticulitis. No abscess. Appendix is normal. Vascular/Lymphatic: Aortic atherosclerosis. No enlarged abdominal or pelvic lymph nodes. Reproductive: Prostate is unremarkable. Other: No free air or free fluid in the abdomen. Scarring in the periumbilical region is likely postoperative. Abdominal wall musculature appears intact. Musculoskeletal: Degenerative changes in the spine and hips. Mild lumbar scoliosis convex towards the left. No  destructive bone lesions. IMPRESSION: 1. Postoperative cholecystectomy with residual fluid in the gallbladder fossa. Amount of fluid is decreased and the gas seen previously is diminished. Decreasing infiltration in the surrounding fat. 2. Diverticulosis of the sigmoid colon with diffuse wall thickening. Likely muscular hypertrophy but can not exclude early diverticulitis. No abscess. No change since prior study. 3. Peripheral fibrosis and mild honeycomb changes in the lungs consistent with usual interstitial pneumonitis. 4. Small esophageal hiatal hernia. 5. Aortic atherosclerosis. Aortic Atherosclerosis (ICD10-I70.0). Electronically Signed   By: Lucienne Capers M.D.   On: 05/26/2020 19:46   DG Chest Port 1 View  Result Date: 05/26/2020 CLINICAL DATA:  Generalized weakness, COVID-19 positive 10 days ago, history of melanoma EXAM: PORTABLE CHEST 1 VIEW COMPARISON:  10/29/2016 FINDINGS: Single frontal view of the chest demonstrates an unremarkable cardiac silhouette. No airspace disease, effusion,  or pneumothorax. Postsurgical changes bilateral axilla. No acute bony abnormalities. IMPRESSION: 1. No acute intrathoracic process. Electronically Signed   By: Randa Ngo M.D.   On: 05/26/2020 17:19      Scheduled Meds: . aspirin EC  81 mg Oral Daily  . enoxaparin (LOVENOX) injection  40 mg Subcutaneous Q24H  . ezetimibe  10 mg Oral Daily  . famotidine  20 mg Oral Daily  . ferrous sulfate  325 mg Oral Daily  . isosorbide mononitrate  30 mg Oral Daily  . levothyroxine  125 mcg Oral Q0600  . potassium chloride  40 mEq Oral Once  . vancomycin  125 mg Oral QID   Continuous Infusions: . sodium chloride 100 mL/hr at 05/27/20 2242     LOS: 1 day      Time spent: 25 minutes   Dessa Phi, DO [facility] 05/28/2020, 12:19 PM   Available via Epic secure chat 7am-7pm After these hours, please refer to coverage provider listed on [email or web address]

## 2020-05-28 NOTE — Plan of Care (Signed)

## 2020-05-29 LAB — CBC WITH DIFFERENTIAL/PLATELET
Abs Immature Granulocytes: 0.11 10*3/uL — ABNORMAL HIGH (ref 0.00–0.07)
Basophils Absolute: 0 10*3/uL (ref 0.0–0.1)
Basophils Relative: 0 %
Eosinophils Absolute: 0.1 10*3/uL (ref 0.0–0.5)
Eosinophils Relative: 1 %
HCT: 24.9 % — ABNORMAL LOW (ref 39.0–52.0)
Hemoglobin: 7.8 g/dL — ABNORMAL LOW (ref 13.0–17.0)
Immature Granulocytes: 1 %
Lymphocytes Relative: 7 %
Lymphs Abs: 1 10*3/uL (ref 0.7–4.0)
MCH: 29.7 pg (ref 26.0–34.0)
MCHC: 31.3 g/dL (ref 30.0–36.0)
MCV: 94.7 fL (ref 80.0–100.0)
Monocytes Absolute: 1.2 10*3/uL — ABNORMAL HIGH (ref 0.1–1.0)
Monocytes Relative: 9 %
Neutro Abs: 11.4 10*3/uL — ABNORMAL HIGH (ref 1.7–7.7)
Neutrophils Relative %: 82 %
Platelets: 285 10*3/uL (ref 150–400)
RBC: 2.63 MIL/uL — ABNORMAL LOW (ref 4.22–5.81)
RDW: 13.8 % (ref 11.5–15.5)
WBC: 13.9 10*3/uL — ABNORMAL HIGH (ref 4.0–10.5)
nRBC: 0 % (ref 0.0–0.2)

## 2020-05-29 LAB — COMPREHENSIVE METABOLIC PANEL
ALT: 13 U/L (ref 0–44)
AST: 14 U/L — ABNORMAL LOW (ref 15–41)
Albumin: 2.1 g/dL — ABNORMAL LOW (ref 3.5–5.0)
Alkaline Phosphatase: 75 U/L (ref 38–126)
Anion gap: 8 (ref 5–15)
BUN: 18 mg/dL (ref 8–23)
CO2: 21 mmol/L — ABNORMAL LOW (ref 22–32)
Calcium: 7.4 mg/dL — ABNORMAL LOW (ref 8.9–10.3)
Chloride: 110 mmol/L (ref 98–111)
Creatinine, Ser: 1.11 mg/dL (ref 0.61–1.24)
GFR, Estimated: >60 mL/min (ref >60)
Glucose, Bld: 76 mg/dL (ref 70–99)
Potassium: 3 mmol/L — ABNORMAL LOW (ref 3.5–5.1)
Sodium: 139 mmol/L (ref 135–145)
Total Bilirubin: 0.4 mg/dL (ref 0.3–1.2)
Total Protein: 5 g/dL — ABNORMAL LOW (ref 6.5–8.1)

## 2020-05-29 LAB — C-REACTIVE PROTEIN: CRP: 19.5 mg/dL — ABNORMAL HIGH (ref <1.0)

## 2020-05-29 LAB — D-DIMER, QUANTITATIVE: D-Dimer, Quant: 19.5 ug/mL-FEU — ABNORMAL HIGH (ref 0.00–0.50)

## 2020-05-29 MED ORDER — ADULT MULTIVITAMIN W/MINERALS CH
1.0000 | ORAL_TABLET | Freq: Every day | ORAL | Status: DC
  Administered 2020-05-29 – 2020-06-03 (×6): 1 via ORAL
  Filled 2020-05-29 (×6): qty 1

## 2020-05-29 MED ORDER — POTASSIUM CHLORIDE CRYS ER 20 MEQ PO TBCR
40.0000 meq | EXTENDED_RELEASE_TABLET | Freq: Once | ORAL | Status: AC
  Administered 2020-05-29: 40 meq via ORAL
  Filled 2020-05-29: qty 2

## 2020-05-29 NOTE — Progress Notes (Signed)
Initial Nutrition Assessment  DOCUMENTATION CODES:   Not applicable  INTERVENTION:    Magic cup TID with meals, each supplement provides 290 kcal and 9 grams of protein  MVI daily   NUTRITION DIAGNOSIS:   Inadequate oral intake related to decreased appetite as evidenced by meal completion < 25%.  GOAL:   Patient will meet greater than or equal to 90% of their needs  MONITOR:   PO intake,Supplement acceptance,Weight trends,Labs,I & O's  REASON FOR ASSESSMENT:   Malnutrition Screening Tool    ASSESSMENT:   Patient with PMH significant for CKD III, CAD s/p CABG, cardiomyopathy, and HLD. Recently admitted for cholecystectomy and then readmitted with diverticulitis. Presents this admission with c.diff and recent COVID infection.   Obtained history from daughter. Patient has had a tough time over the last month due to recurrent admissions. States during his time in the hospital he spent most days on clear liquids and eating bland foods. Once discharged home last week he had 5 days of eating three meals daily that consisted of B-egg, cereal L- Ensure D- meat, vegetable, grain. Appetite slow to progress this admission. Had oatmeal for lunch and is going to try rice for dinner. Patient likes Ensure but would like to hold off until c.diff symptoms improve. Will add other supplement.   Patient endorses a UBW of 145-150 lb and a recent wt loss of 5 lbs. Records indicate patient weighed 66 kg on 1/9 and shows a weight of 77 kg this admission (likely pulled from last admission). Will need to obtain actual wt to assess for wt loss.   UOP: 250 ml x 24 hrs   Medications: NS @ 50 ml/hr  Labs: K 3.0 (L) CBG 76-128  Diet Order:   Diet Order            Diet regular Room service appropriate? Yes; Fluid consistency: Thin  Diet effective now                 EDUCATION NEEDS:   Education needs have been addressed  Skin:  Skin Assessment: Skin Integrity Issues: Skin Integrity Issues::  Other (Comment) Other: pressure injury- sacrum  Last BM:  1/29  Height:   Ht Readings from Last 1 Encounters:  05/26/20 6' (1.829 m)    Weight:   Wt Readings from Last 1 Encounters:  05/26/20 71 kg    BMI:  Body mass index is 21.23 kg/m.  Estimated Nutritional Needs:   Kcal:  1800-2000 kcal  Protein:  90-105 grams  Fluid:  >/= 1.8 L/day  Mariana Single RD, LDN Clinical Nutrition Pager listed in [place]

## 2020-05-29 NOTE — Progress Notes (Signed)
PROGRESS NOTE    Gary Gonzalez  [id number] DOB: 18-Mar-1927 DOA: 05/26/2020 PCP: [name], MD     Brief Narrative:  Gary Gonzalez a 87 y.o.malewithhistory of CAD status post CABG, PVC induced cardiomyopathy last EF in 2018 was normalized with grade 1 diastolic dysfunction follows with Dr. [name] cardiologist, who was recently admitted for cholecystectomy following which patient also was readmitted again for diverticulitis and covid. He was discharged home on May 16, 2020 and was taking oral antibiotics until 4 days ago. After completion of patient antibiotic course, patient started having fever, chills and got weak. He tested positive for C Diff on admission.   New events last 24 hours / Subjective: Feeling somewhat better. Still having loose stools but improved since admission, had couple more episodes this morning, associated with lower abdominal cramping. Did well with PT and hopeful to improve and get home on discharge. No SOB. No nausea.   Assessment & Plan:   Principal Problem:   C. difficile diarrhea Active Problems:   CAD (coronary artery disease)   PVC's (premature ventricular contractions)   S/P CABG x 2   CKD (chronic kidney disease), stage III (HCC)   Chronic combined systolic and diastolic CHF (congestive heart failure) (HCC)   Hypothyroidism (acquired)   Generalized weakness   C. Difficile -Tested positive for C. difficile on admission 1/28.  Oral vancomycin started 1/28  -CT abdomen pelvis revealed diverticulosis of the sigmoid colon with diffuse wall thickening, cannot exclude early diverticulitis and was empirically started on Zosyn, I will discontinue antibiotics for now and monitor on oral vancomycin -Continue to monitor leukocytosis closely - trending down   CAD status post CABG -Continue aspirin, Zetia, Imdur  Demand ischemia -Troponin trend has been flat 21 >> 21 >> 24 >> 25 >> 25 -Likely secondary to acute infection as above -No complaints  of chest pain   History of PVC induced cardiomyopathy -Continue to monitor on telemetry  Hypothyroidism -Continue Synthroid  Recent Covid infection -Tested positive 1/15 -Completed 3 days of Remdesivir -Patient has no respiratory complaints, he is out of the isolation window -CT chest: Emphysematous changes and fibrosis in the lungs. Atelectasis in the lung bases. Mild peripheral bronchiectasis in the bases. No focal consolidation.  Elevated d-dimer -Had trended down after COVID but now trended up. He has had 3 hospitalizations, with recent surgery. CTA chest and venous duplex neg for VTE   Hypokalemia -Replace, trend   In agreement with assessment of the pressure ulcer as below:  Pressure Injury 05/27/20 Sacrum Medial;Lower  blanchable; moisture associated (Active)  05/27/20 2300  Location: Sacrum  Location Orientation: Medial;Lower  Staging:   Wound Description (Comments): blanchable; moisture associated  Present on Admission:          DVT prophylaxis:  enoxaparin (LOVENOX) injection 40 mg Start: 05/27/20 1000  Code Status: Full Family Communication: Daughter at bedside  Disposition Plan:  Status is: Inpatient  Remains inpatient appropriate because:Inpatient level of care appropriate due to severity of illness   Dispo: The patient is from: Home              Anticipated d/c is to: Home              Anticipated d/c date is: 2 days              Patient currently is not medically stable to d/c.    Difficult to place patient No   Antimicrobials:  Anti-infectives (From admission, onward)   Start  Dose/Rate Route Frequency Ordered Stop   05/27/20 1100  vancomycin (VANCOCIN) 50 mg/mL oral solution 125 mg        125 mg Oral 4 times daily 05/27/20 1037 06/06/20 0959   05/27/20 0600  piperacillin-tazobactam (ZOSYN) IVPB 3.375 g  Status:  Discontinued        3.375 g 12.5 mL/hr over 240 Minutes Intravenous Every 8 hours 05/27/20 0026 05/27/20 1037   05/26/20  2100  piperacillin-tazobactam (ZOSYN) IVPB 3.375 g        3.375 g 100 mL/hr over 30 Minutes Intravenous  Once 05/26/20 2058 05/26/20 2255       Objective: Vitals:   05/28/20 0942 05/28/20 1355 05/28/20 2153 05/29/20 0430  BP: (!) 125/50 (!) 129/56 (!) 126/59 (!) 134/58  Pulse: 86 88 84 81  Resp: 16 18 20 20   Temp: 100 F (37.8 C) 98.8 F (37.1 C) 99.4 F (37.4 C) 98 F (36.7 C)  TempSrc: Oral Oral Oral Oral  SpO2: 93% 95% 94% 92%  Weight:      Height:        Intake/Output Summary (Last 24 hours) at 05/29/2020 1048 Last data filed at 05/29/2020 0400 Gross per 24 hour  Intake 120 ml  Output 250 ml  Net -130 ml   Filed Weights   05/26/20 1606  Weight: 71 kg    Examination: General exam: Appears calm and comfortable  Respiratory system: Clear to auscultation. Respiratory effort normal. Cardiovascular system: S1 & S2 heard, RRR. No pedal edema. Gastrointestinal system: Abdomen is nondistended, soft and nontender. Normal bowel sounds heard. Central nervous system: Alert and oriented. Non focal exam. Speech clear  Extremities: Symmetric in appearance bilaterally  Skin: No rashes, lesions or ulcers on exposed skin  Psychiatry: Judgement and insight appear stable. Mood & affect appropriate.    Data Reviewed: I have personally reviewed following labs and imaging studies  CBC: Recent Labs  Lab 05/26/20 1700 05/27/20 0834 05/28/20 0545 05/29/20 0638  WBC 19.2* 25.9* 16.9* 13.9*  NEUTROABS  --  23.3* 14.8* 11.4*  HGB 10.1* 9.5* 7.9* 7.8*  HCT 31.4* 29.2* 25.0* 24.9*  MCV 92.4 92.1 93.6 94.7  PLT 387 342 307 [id number]   Basic Metabolic Panel: Recent Labs  Lab 05/26/20 1700 05/27/20 0014 05/27/20 0834 05/28/20 0545 05/29/20 0638  NA 136  --  136 141 139  K 4.0  --  4.1 3.3* 3.0*  CL 99  --  104 108 110  CO2 25  --  24 22 21*  GLUCOSE 128*  --  120* 96 76  BUN 20  --  16 19 18   CREATININE 1.20 1.13 1.09 1.27* 1.11  CALCIUM 8.3*  --  7.8* 7.5* 7.4*    GFR: Estimated Creatinine Clearance: 43.5 mL/min (by C-G formula based on SCr of 1.11 mg/dL). Liver Function Tests: Recent Labs  Lab 05/26/20 1700 05/27/20 0834 05/28/20 0545 05/29/20 0638  AST 24 23 15  14*  ALT 22 18 15 13   ALKPHOS 115 91 76 75  BILITOT 0.5 0.9 0.6 0.4  PROT 6.5 5.9* 5.1* 5.0*  ALBUMIN 2.8* 2.3* 2.2* 2.1*   No results for input(s): LIPASE, AMYLASE in the last 168 hours. No results for input(s): AMMONIA in the last 168 hours. Coagulation Profile: No results for input(s): INR, PROTIME in the last 168 hours. Cardiac Enzymes: No results for input(s): CKTOTAL, CKMB, CKMBINDEX, TROPONINI in the last 168 hours. BNP (last 3 results) No results for input(s): PROBNP in the last 8760 hours. HbA1C: No  results for input(s): HGBA1C in the last 72 hours. CBG: No results for input(s): GLUCAP in the last 168 hours. Lipid Profile: No results for input(s): CHOL, HDL, LDLCALC, TRIG, CHOLHDL, LDLDIRECT in the last 72 hours. Thyroid Function Tests: Recent Labs    05/27/20 0834  TSH 0.536   Anemia Panel: No results for input(s): VITAMINB12, FOLATE, FERRITIN, TIBC, IRON, RETICCTPCT in the last 72 hours. Sepsis Labs: Recent Labs  Lab 05/27/20 0010  PROCALCITON <0.10    Recent Results (from the past 240 hour(s))  Culture, blood (routine x 2)     Status: None (Preliminary result)   Collection Time: 05/26/20 12:10 AM   Specimen: BLOOD RIGHT FOREARM  Result Value Ref Range Status   Specimen Description   Final    BLOOD RIGHT FOREARM Performed at [facility] [address], [place], [place] [address]    Special Requests   Final    BOTTLES DRAWN AEROBIC AND ANAEROBIC Blood Culture results may not be optimal due to an inadequate volume of blood received in culture bottles Performed at [facility] [address] [place], [place] [address]    Culture   Final    NO GROWTH 2 DAYS Performed at [facility] [address], [place], [place] [address]    Report Status PENDING  Incomplete  C Difficile Quick Screen w PCR reflex     Status: Abnormal   Collection Time: 05/27/20  9:06 AM   Specimen: STOOL  Result Value Ref Range Status   C Diff antigen POSITIVE (A) NEGATIVE Final   C Diff toxin POSITIVE (A) NEGATIVE Final   C Diff interpretation Toxin producing C. difficile detected.  Final    Comment: CRITICAL RESULT CALLED TO, READ BACK BY AND VERIFIED WITH: BANNO,A @ 1009 ON [id number] BY POTEAT,S Performed at [facility] [address], [place], [place] [address]   Gastrointestinal Panel by PCR , Stool     Status: None   Collection Time: 05/27/20  9:06 AM   Specimen: STOOL  Result Value Ref Range Status   Campylobacter species NOT DETECTED NOT DETECTED Final   Plesimonas shigelloides NOT DETECTED NOT DETECTED Final   Salmonella species NOT DETECTED NOT DETECTED Final   Yersinia enterocolitica NOT DETECTED NOT DETECTED Final   Vibrio species NOT DETECTED NOT DETECTED Final   Vibrio cholerae NOT DETECTED NOT DETECTED Final   Enteroaggregative E coli (EAEC) NOT DETECTED NOT DETECTED Final   Enteropathogenic E coli (EPEC) NOT DETECTED NOT DETECTED Final   Enterotoxigenic E coli (ETEC) NOT DETECTED NOT DETECTED Final   Shiga like toxin producing E coli (STEC) NOT DETECTED NOT DETECTED Final   Shigella/Enteroinvasive E coli (EIEC) NOT DETECTED NOT DETECTED Final   Cryptosporidium NOT DETECTED NOT DETECTED Final   Cyclospora cayetanensis NOT DETECTED NOT DETECTED Final   Entamoeba histolytica NOT DETECTED NOT DETECTED Final   Giardia lamblia NOT DETECTED NOT DETECTED Final   Adenovirus F40/41 NOT DETECTED NOT DETECTED Final   Astrovirus NOT DETECTED NOT DETECTED Final   Norovirus GI/GII NOT DETECTED NOT DETECTED Final   Rotavirus A NOT DETECTED NOT DETECTED Final   Sapovirus (I, II, IV, and V) NOT DETECTED NOT DETECTED Final    Comment: Performed at [facility], [address], [place], [place] [address]      Radiology Studies: CT ANGIO CHEST PE W OR WO CONTRAST  Result Date: 05/28/2020 CLINICAL DATA:  COVID-19, elevated D-dimer, question pulmonary embolism,  history CHF, chronic kidney disease, coronary artery disease post CABG, melanoma; post laparoscopic cholecystectomy on 05/10/2020, ERCP with stone extraction on 05/11/2020 EXAM: CT ANGIOGRAPHY CHEST WITH CONTRAST TECHNIQUE: Multidetector CT imaging of the chest was performed using the standard protocol during bolus administration of intravenous contrast. Multiplanar CT image reconstructions and MIPs were obtained to evaluate the vascular anatomy. CONTRAST:  168mL OMNIPAQUE IOHEXOL 350 MG/ML SOLN IV COMPARISON:  CT chest 05/27/2020, CT abdomen and pelvis 05/26/2020 FINDINGS: Cardiovascular: Atherosclerotic calcifications aorta, proximal great vessels and coronary arteries. Postsurgical changes of CABG. Aorta normal caliber without aneurysm or dissection. Slight dilatation of cardiac chambers. No pericardial effusion. Pulmonary arteries adequately opacified and patent. No evidence of pulmonary embolism. Mediastinum/Nodes: Base of cervical region normal appearance. Esophagus unremarkable. No thoracic adenopathy. Lungs/Pleura: Calcified granuloma LEFT upper lobe. Small BILATERAL pleural effusions with compressive atelectasis of adjacent lower lobes. Scarring in anteromedial RIGHT middle lobe unchanged. No acute infiltrate or pneumothorax. Upper Abdomen: Calcified granulomata within liver and spleen. Surgical clips at gallbladder fossa with minimal persistent fluid collection at gallbladder fossa consistent with recent surgery. Minimal pneumobilia. Musculoskeletal: Prior median sternotomy. No acute osseous findings. Review of the MIP images confirms the above findings. IMPRESSION: No evidence of pulmonary embolism. Small BILATERAL pleural effusions with compressive atelectasis of adjacent lower lobes. Evidence of prior granulomatous  disease. Recent cholecystectomy with persistent fluid in gallbladder fossa and pneumobilia from ERCP with sphincterotomy. Aortic Atherosclerosis (ICD10-I70.0). Electronically Signed   By: Lavonia Dana M.D.   On: 05/28/2020 14:40   VAS [place] LOWER EXTREMITY VENOUS (DVT)  Result Date: 05/28/2020  Lower Venous DVT Study Indications: Post-op, and Covid, elevated D-dimer.  Limitations: Shadowing from calcified vessels. Comparison Study: Prior study 05/16/20 - negative Performing Technologist: Rogelia Rohrer  Examination Guidelines: A complete evaluation includes B-mode imaging, spectral Doppler, color Doppler, and power Doppler as needed of all accessible portions of each vessel. Bilateral testing is considered an integral part of a complete examination. Limited examinations for reoccurring indications may be performed as noted. The reflux portion of the exam is performed with the patient in reverse Trendelenburg.  +---------+---------------+---------+-----------+----------+--------------+ RIGHT    CompressibilityPhasicitySpontaneityPropertiesThrombus Aging +---------+---------------+---------+-----------+----------+--------------+ CFV      Full           Yes      Yes                                 +---------+---------------+---------+-----------+----------+--------------+ SFJ      Full                                                        +---------+---------------+---------+-----------+----------+--------------+ FV Prox  Full           Yes      Yes                                 +---------+---------------+---------+-----------+----------+--------------+ FV Mid   Full           Yes      Yes                                 +---------+---------------+---------+-----------+----------+--------------+ FV DistalFull  Yes      Yes                                 +---------+---------------+---------+-----------+----------+--------------+ PFV      Full                                                         +---------+---------------+---------+-----------+----------+--------------+ POP      Full           Yes      Yes                                 +---------+---------------+---------+-----------+----------+--------------+ PTV      Full                                                        +---------+---------------+---------+-----------+----------+--------------+ PERO     Full                                                        +---------+---------------+---------+-----------+----------+--------------+   +---------+---------------+---------+-----------+----------+--------------+ LEFT     CompressibilityPhasicitySpontaneityPropertiesThrombus Aging +---------+---------------+---------+-----------+----------+--------------+ CFV      Full           Yes      Yes                                 +---------+---------------+---------+-----------+----------+--------------+ SFJ      Full                                                        +---------+---------------+---------+-----------+----------+--------------+ FV Prox  Full           Yes      Yes                                 +---------+---------------+---------+-----------+----------+--------------+ FV Mid   Full           Yes      Yes                                 +---------+---------------+---------+-----------+----------+--------------+ FV DistalFull           Yes      Yes                                 +---------+---------------+---------+-----------+----------+--------------+ PFV      Full                                                        +---------+---------------+---------+-----------+----------+--------------+  POP      Full           Yes      Yes                                 +---------+---------------+---------+-----------+----------+--------------+ PTV      Full                                                         +---------+---------------+---------+-----------+----------+--------------+ PERO     Full                                                        +---------+---------------+---------+-----------+----------+--------------+     Summary: BILATERAL: - No evidence of deep vein thrombosis seen in the lower extremities, bilaterally. - RIGHT: - No cystic structure found in the popliteal fossa.  LEFT: - A cystic structure is found in the popliteal fossa.  *See table(s) above for measurements and observations.    Preliminary       Scheduled Meds: . aspirin EC  81 mg Oral Daily  . enoxaparin (LOVENOX) injection  40 mg Subcutaneous Q24H  . ezetimibe  10 mg Oral Daily  . famotidine  20 mg Oral Daily  . ferrous sulfate  325 mg Oral Daily  . isosorbide mononitrate  30 mg Oral Daily  . levothyroxine  125 mcg Oral Q0600  . potassium chloride  40 mEq Oral Once  . vancomycin  125 mg Oral QID   Continuous Infusions: . sodium chloride 100 mL/hr at 05/27/20 2242     LOS: 2 days      Time spent: 25 minutes   Dessa Phi, DO [facility] 05/29/2020, 10:48 AM   Available via Epic secure chat 7am-7pm After these hours, please refer to coverage provider listed on [email or web address]

## 2020-05-30 LAB — CBC WITH DIFFERENTIAL/PLATELET
Abs Immature Granulocytes: 0.11 10*3/uL — ABNORMAL HIGH (ref 0.00–0.07)
Basophils Absolute: 0 10*3/uL (ref 0.0–0.1)
Basophils Relative: 0 %
Eosinophils Absolute: 0.3 10*3/uL (ref 0.0–0.5)
Eosinophils Relative: 3 %
HCT: 27.2 % — ABNORMAL LOW (ref 39.0–52.0)
Hemoglobin: 8.6 g/dL — ABNORMAL LOW (ref 13.0–17.0)
Immature Granulocytes: 1 %
Lymphocytes Relative: 10 %
Lymphs Abs: 1.1 10*3/uL (ref 0.7–4.0)
MCH: 29.4 pg (ref 26.0–34.0)
MCHC: 31.6 g/dL (ref 30.0–36.0)
MCV: 92.8 fL (ref 80.0–100.0)
Monocytes Absolute: 0.9 10*3/uL (ref 0.1–1.0)
Monocytes Relative: 9 %
Neutro Abs: 7.8 10*3/uL — ABNORMAL HIGH (ref 1.7–7.7)
Neutrophils Relative %: 77 %
Platelets: 332 10*3/uL (ref 150–400)
RBC: 2.93 MIL/uL — ABNORMAL LOW (ref 4.22–5.81)
RDW: 13.7 % (ref 11.5–15.5)
WBC: 10.1 10*3/uL (ref 4.0–10.5)
nRBC: 0 % (ref 0.0–0.2)

## 2020-05-30 LAB — COMPREHENSIVE METABOLIC PANEL
ALT: 12 U/L (ref 0–44)
AST: 15 U/L (ref 15–41)
Albumin: 1.9 g/dL — ABNORMAL LOW (ref 3.5–5.0)
Alkaline Phosphatase: 78 U/L (ref 38–126)
Anion gap: 9 (ref 5–15)
BUN: 19 mg/dL (ref 8–23)
CO2: 20 mmol/L — ABNORMAL LOW (ref 22–32)
Calcium: 7.7 mg/dL — ABNORMAL LOW (ref 8.9–10.3)
Chloride: 112 mmol/L — ABNORMAL HIGH (ref 98–111)
Creatinine, Ser: 1 mg/dL (ref 0.61–1.24)
GFR, Estimated: >60 mL/min (ref >60)
Glucose, Bld: 87 mg/dL (ref 70–99)
Potassium: 3.2 mmol/L — ABNORMAL LOW (ref 3.5–5.1)
Sodium: 141 mmol/L (ref 135–145)
Total Bilirubin: 0.4 mg/dL (ref 0.3–1.2)
Total Protein: 4.9 g/dL — ABNORMAL LOW (ref 6.5–8.1)

## 2020-05-30 LAB — D-DIMER, QUANTITATIVE: D-Dimer, Quant: >20 ug/mL-FEU — ABNORMAL HIGH (ref 0.00–0.50)

## 2020-05-30 LAB — MAGNESIUM: Magnesium: 1.9 mg/dL (ref 1.7–2.4)

## 2020-05-30 LAB — C-REACTIVE PROTEIN: CRP: 13.5 mg/dL — ABNORMAL HIGH (ref <1.0)

## 2020-05-30 MED ORDER — RISAQUAD PO CAPS
1.0000 | ORAL_CAPSULE | Freq: Three times a day (TID) | ORAL | Status: DC
  Administered 2020-05-30 – 2020-06-03 (×12): 1 via ORAL
  Filled 2020-05-30 (×12): qty 1

## 2020-05-30 MED ORDER — POTASSIUM CHLORIDE CRYS ER 20 MEQ PO TBCR
40.0000 meq | EXTENDED_RELEASE_TABLET | Freq: Two times a day (BID) | ORAL | Status: AC
  Administered 2020-05-30 (×2): 40 meq via ORAL
  Filled 2020-05-30 (×2): qty 2

## 2020-05-30 NOTE — Plan of Care (Signed)

## 2020-05-30 NOTE — Care Management Important Message (Signed)
Important Message  Patient Details IM Letter placed in Patient's door Caddy. Name: Gary Gonzalez MRN: [id number] Date of Birth: 1926-06-02   Medicare Important Message Given:  Yes     [name] 05/30/2020, 1:43 PM

## 2020-05-30 NOTE — Progress Notes (Signed)
PROGRESS NOTE    Gary Gonzalez  [id number] DOB: 1927-03-21 DOA: 05/26/2020 PCP: [name], MD     Brief Narrative:  Gary Gonzalez a 87 y.o.malewithhistory of CAD status post CABG, PVC induced cardiomyopathy last EF in 2018 was normalized with grade 1 diastolic dysfunction follows with Dr. [name] cardiologist, who was recently admitted for cholecystectomy following which patient also was readmitted again for diverticulitis and covid. He was discharged home on May 16, 2020 and was taking oral antibiotics until 4 days ago. After completion of patient antibiotic course, patient started having fever, chills and got weak. He tested positive for C Diff on admission.   New events last 24 hours / Subjective: Still having some stool leaking, was able to eat some oatmeal yesterday.  Hoping to get up to shave, shower and be more active today.  Denies any chest pain.  Assessment & Plan:   Principal Problem:   C. difficile diarrhea Active Problems:   CAD (coronary artery disease)   PVC's (premature ventricular contractions)   S/P CABG x 2   CKD (chronic kidney disease), stage III (HCC)   Chronic combined systolic and diastolic CHF (congestive heart failure) (HCC)   Hypothyroidism (acquired)   Generalized weakness   C. Difficile -Tested positive for C. difficile on admission 1/28.  Oral vancomycin started 1/28  -CT abdomen pelvis revealed diverticulosis of the sigmoid colon with diffuse wall thickening, cannot exclude early diverticulitis and was empirically started on Zosyn, I will discontinue antibiotics for now and monitor on oral vancomycin -Leukocytosis resolved  CAD status post CABG -Continue aspirin, Zetia, Imdur  Demand ischemia -Troponin trend has been flat 21 >> 21 >> 24 >> 25 >> 25 -Likely secondary to acute infection as above -No complaints of chest pain   History of PVC induced cardiomyopathy -Continue to monitor on telemetry  Hypothyroidism -Continue  Synthroid  Recent Covid infection -Tested positive 1/15 -Completed 3 days of Remdesivir -Patient has no respiratory complaints, he is out of the isolation window -CT chest: Emphysematous changes and fibrosis in the lungs. Atelectasis in the lung bases. Mild peripheral bronchiectasis in the bases. No focal consolidation.  Elevated d-dimer -Had trended down after COVID but now trended up. He has had 3 hospitalizations, with recent surgery. CTA chest and venous duplex neg for VTE   Hypokalemia -Replace, trend   In agreement with assessment of the pressure ulcer as below:  Pressure Injury 05/27/20 Sacrum Medial;Lower  blanchable; moisture associated (Active)  05/27/20 2300  Location: Sacrum  Location Orientation: Medial;Lower  Staging:   Wound Description (Comments): blanchable; moisture associated  Present on Admission:      Nutrition Problem: Inadequate oral intake Etiology: decreased appetite   DVT prophylaxis:  enoxaparin (LOVENOX) injection 40 mg Start: 05/27/20 1000  Code Status: Full Family Communication: Daughter at bedside  Disposition Plan:  Status is: Inpatient  Remains inpatient appropriate because:Inpatient level of care appropriate due to severity of illness   Dispo: The patient is from: Home              Anticipated d/c is to: Home              Anticipated d/c date is: 2 days              Patient currently is not medically stable to d/c.  Continue to monitor D-dimer for improvement, p.o. intake, improvement in stools   Difficult to place patient No   Antimicrobials:  Anti-infectives (From admission, onward)   Start  Dose/Rate Route Frequency Ordered Stop   05/27/20 1100  vancomycin (VANCOCIN) 50 mg/mL oral solution 125 mg        125 mg Oral 4 times daily 05/27/20 1037 06/06/20 0959   05/27/20 0600  piperacillin-tazobactam (ZOSYN) IVPB 3.375 g  Status:  Discontinued        3.375 g 12.5 mL/hr over 240 Minutes Intravenous Every 8 hours 05/27/20 0026  05/27/20 1037   05/26/20 2100  piperacillin-tazobactam (ZOSYN) IVPB 3.375 g        3.375 g 100 mL/hr over 30 Minutes Intravenous  Once 05/26/20 2058 05/26/20 2255       Objective: Vitals:   05/29/20 0430 05/29/20 1226 05/29/20 2246 05/30/20 0521  BP: (!) 134/58 127/62 (!) 139/59 131/63  Pulse: 81 80 77 67  Resp: 20 18 18 18   Temp: 98 F (36.7 C) 98.1 F (36.7 C) 98.4 F (36.9 C) (!) 97.5 F (36.4 C)  TempSrc: Oral Oral    SpO2: 92% 92% 94% 95%  Weight:      Height:        Intake/Output Summary (Last 24 hours) at 05/30/2020 1220 Last data filed at 05/30/2020 0600 Gross per 24 hour  Intake 150 ml  Output 400 ml  Net -250 ml   Filed Weights   05/26/20 1606  Weight: 71 kg    Examination: General exam: Appears calm and comfortable  Respiratory system: Clear to auscultation. Respiratory effort normal. Cardiovascular system: S1 & S2 heard, RRR. No pedal edema. Gastrointestinal system: Abdomen is nondistended, soft and nontender. Normal bowel sounds heard. Central nervous system: Alert and oriented. Non focal exam. Speech clear  Extremities: Symmetric in appearance bilaterally  Skin: No rashes, lesions or ulcers on exposed skin  Psychiatry: Judgement and insight appear stable. Mood & affect appropriate.    Data Reviewed: I have personally reviewed following labs and imaging studies  CBC: Recent Labs  Lab 05/26/20 1700 05/27/20 0834 05/28/20 0545 05/29/20 0638 05/30/20 0512  WBC 19.2* 25.9* 16.9* 13.9* 10.1  NEUTROABS  --  23.3* 14.8* 11.4* 7.8*  HGB 10.1* 9.5* 7.9* 7.8* 8.6*  HCT 31.4* 29.2* 25.0* 24.9* 27.2*  MCV 92.4 92.1 93.6 94.7 92.8  PLT 387 342 307 285 712   Basic Metabolic Panel: Recent Labs  Lab 05/26/20 1700 05/27/20 0014 05/27/20 0834 05/28/20 0545 05/29/20 0638 05/30/20 0512  NA 136  --  136 141 139 141  K 4.0  --  4.1 3.3* 3.0* 3.2*  CL 99  --  104 108 110 112*  CO2 25  --  24 22 21* 20*  GLUCOSE 128*  --  120* 96 76 87  BUN 20  --  16  19 18 19   CREATININE 1.20 1.13 1.09 1.27* 1.11 1.00  CALCIUM 8.3*  --  7.8* 7.5* 7.4* 7.7*  MG  --   --   --   --   --  1.9   GFR: Estimated Creatinine Clearance: 48.3 mL/min (by C-G formula based on SCr of 1 mg/dL). Liver Function Tests: Recent Labs  Lab 05/26/20 1700 05/27/20 0834 05/28/20 0545 05/29/20 0638 05/30/20 0512  AST 24 23 15  14* 15  ALT 22 18 15 13 12   ALKPHOS 115 91 76 75 78  BILITOT 0.5 0.9 0.6 0.4 0.4  PROT 6.5 5.9* 5.1* 5.0* 4.9*  ALBUMIN 2.8* 2.3* 2.2* 2.1* 1.9*   No results for input(s): LIPASE, AMYLASE in the last 168 hours. No results for input(s): AMMONIA in the last 168 hours. Coagulation  Profile: No results for input(s): INR, PROTIME in the last 168 hours. Cardiac Enzymes: No results for input(s): CKTOTAL, CKMB, CKMBINDEX, TROPONINI in the last 168 hours. BNP (last 3 results) No results for input(s): PROBNP in the last 8760 hours. HbA1C: No results for input(s): HGBA1C in the last 72 hours. CBG: No results for input(s): GLUCAP in the last 168 hours. Lipid Profile: No results for input(s): CHOL, HDL, LDLCALC, TRIG, CHOLHDL, LDLDIRECT in the last 72 hours. Thyroid Function Tests: No results for input(s): TSH, T4TOTAL, FREET4, T3FREE, THYROIDAB in the last 72 hours. Anemia Panel: No results for input(s): VITAMINB12, FOLATE, FERRITIN, TIBC, IRON, RETICCTPCT in the last 72 hours. Sepsis Labs: Recent Labs  Lab 05/27/20 0010  PROCALCITON <0.10    Recent Results (from the past 240 hour(s))  Culture, blood (routine x 2)     Status: None (Preliminary result)   Collection Time: 05/26/20 12:10 AM   Specimen: BLOOD RIGHT FOREARM  Result Value Ref Range Status   Specimen Description   Final    BLOOD RIGHT FOREARM Performed at [facility], [address] [place], [place] [address]    Special Requests   Final    BOTTLES DRAWN AEROBIC AND ANAEROBIC Blood Culture results may not be optimal due to an inadequate volume of blood  received in culture bottles Performed at [facility], [address] [place], [place] [address]    Culture   Final    NO GROWTH 2 DAYS Performed at [facility], [address] [place], [place] [address]    Report Status PENDING  Incomplete  C Difficile Quick Screen w PCR reflex     Status: Abnormal   Collection Time: 05/27/20  9:06 AM   Specimen: STOOL  Result Value Ref Range Status   C Diff antigen POSITIVE (A) NEGATIVE Final   C Diff toxin POSITIVE (A) NEGATIVE Final   C Diff interpretation Toxin producing C. difficile detected.  Final    Comment: CRITICAL RESULT CALLED TO, READ BACK BY AND VERIFIED WITH: BANNO,A @ 1009 ON [id number] BY POTEAT,S Performed at [facility], [address] [place], [place] [address]   Gastrointestinal Panel by PCR , Stool     Status: None   Collection Time: 05/27/20  9:06 AM   Specimen: STOOL  Result Value Ref Range Status   Campylobacter species NOT DETECTED NOT DETECTED Final   Plesimonas shigelloides NOT DETECTED NOT DETECTED Final   Salmonella species NOT DETECTED NOT DETECTED Final   Yersinia enterocolitica NOT DETECTED NOT DETECTED Final   Vibrio species NOT DETECTED NOT DETECTED Final   Vibrio cholerae NOT DETECTED NOT DETECTED Final   Enteroaggregative E coli (EAEC) NOT DETECTED NOT DETECTED Final   Enteropathogenic E coli (EPEC) NOT DETECTED NOT DETECTED Final   Enterotoxigenic E coli (ETEC) NOT DETECTED NOT DETECTED Final   Shiga like toxin producing E coli (STEC) NOT DETECTED NOT DETECTED Final   Shigella/Enteroinvasive E coli (EIEC) NOT DETECTED NOT DETECTED Final   Cryptosporidium NOT DETECTED NOT DETECTED Final   Cyclospora cayetanensis NOT DETECTED NOT DETECTED Final   Entamoeba histolytica NOT DETECTED NOT DETECTED Final   Giardia lamblia NOT DETECTED NOT DETECTED Final   Adenovirus F40/41 NOT DETECTED NOT DETECTED Final   Astrovirus NOT DETECTED NOT DETECTED Final   Norovirus GI/GII  NOT DETECTED NOT DETECTED Final   Rotavirus A NOT DETECTED NOT DETECTED Final   Sapovirus (I, II, IV, and V) NOT DETECTED NOT DETECTED  Final    Comment: Performed at [facility], [address] [place], [place] [address]      Radiology Studies: CT ANGIO CHEST PE W OR WO CONTRAST  Result Date: 05/28/2020 CLINICAL DATA:  COVID-19, elevated D-dimer, question pulmonary embolism, history CHF, chronic kidney disease, coronary artery disease post CABG, melanoma; post laparoscopic cholecystectomy on 05/10/2020, ERCP with stone extraction on 05/11/2020 EXAM: CT ANGIOGRAPHY CHEST WITH CONTRAST TECHNIQUE: Multidetector CT imaging of the chest was performed using the standard protocol during bolus administration of intravenous contrast. Multiplanar CT image reconstructions and MIPs were obtained to evaluate the vascular anatomy. CONTRAST:  110mL OMNIPAQUE IOHEXOL 350 MG/ML SOLN IV COMPARISON:  CT chest 05/27/2020, CT abdomen and pelvis 05/26/2020 FINDINGS: Cardiovascular: Atherosclerotic calcifications aorta, proximal great vessels and coronary arteries. Postsurgical changes of CABG. Aorta normal caliber without aneurysm or dissection. Slight dilatation of cardiac chambers. No pericardial effusion. Pulmonary arteries adequately opacified and patent. No evidence of pulmonary embolism. Mediastinum/Nodes: Base of cervical region normal appearance. Esophagus unremarkable. No thoracic adenopathy. Lungs/Pleura: Calcified granuloma LEFT upper lobe. Small BILATERAL pleural effusions with compressive atelectasis of adjacent lower lobes. Scarring in anteromedial RIGHT middle lobe unchanged. No acute infiltrate or pneumothorax. Upper Abdomen: Calcified granulomata within liver and spleen. Surgical clips at gallbladder fossa with minimal persistent fluid collection at gallbladder fossa consistent with recent surgery. Minimal pneumobilia. Musculoskeletal: Prior median sternotomy. No acute osseous findings. Review of  the MIP images confirms the above findings. IMPRESSION: No evidence of pulmonary embolism. Small BILATERAL pleural effusions with compressive atelectasis of adjacent lower lobes. Evidence of prior granulomatous disease. Recent cholecystectomy with persistent fluid in gallbladder fossa and pneumobilia from ERCP with sphincterotomy. Aortic Atherosclerosis (ICD10-I70.0). Electronically Signed   By: Lavonia Dana M.D.   On: 05/28/2020 14:40   VAS [place] LOWER EXTREMITY VENOUS (DVT)  Result Date: 05/29/2020  Lower Venous DVT Study Indications: Post-op, and Covid, elevated D-dimer.  Limitations: Shadowing from calcified vessels. Comparison Study: Prior study 05/16/20 - negative Performing Technologist: Rogelia Rohrer  Examination Guidelines: A complete evaluation includes B-mode imaging, spectral Doppler, color Doppler, and power Doppler as needed of all accessible portions of each vessel. Bilateral testing is considered an integral part of a complete examination. Limited examinations for reoccurring indications may be performed as noted. The reflux portion of the exam is performed with the patient in reverse Trendelenburg.  +---------+---------------+---------+-----------+----------+--------------+ RIGHT    CompressibilityPhasicitySpontaneityPropertiesThrombus Aging +---------+---------------+---------+-----------+----------+--------------+ CFV      Full           Yes      Yes                                 +---------+---------------+---------+-----------+----------+--------------+ SFJ      Full                                                        +---------+---------------+---------+-----------+----------+--------------+ FV Prox  Full           Yes      Yes                                 +---------+---------------+---------+-----------+----------+--------------+ FV Mid   Full  Yes      Yes                                  +---------+---------------+---------+-----------+----------+--------------+ FV DistalFull           Yes      Yes                                 +---------+---------------+---------+-----------+----------+--------------+ PFV      Full                                                        +---------+---------------+---------+-----------+----------+--------------+ POP      Full           Yes      Yes                                 +---------+---------------+---------+-----------+----------+--------------+ PTV      Full                                                        +---------+---------------+---------+-----------+----------+--------------+ PERO     Full                                                        +---------+---------------+---------+-----------+----------+--------------+   +---------+---------------+---------+-----------+----------+--------------+ LEFT     CompressibilityPhasicitySpontaneityPropertiesThrombus Aging +---------+---------------+---------+-----------+----------+--------------+ CFV      Full           Yes      Yes                                 +---------+---------------+---------+-----------+----------+--------------+ SFJ      Full                                                        +---------+---------------+---------+-----------+----------+--------------+ FV Prox  Full           Yes      Yes                                 +---------+---------------+---------+-----------+----------+--------------+ FV Mid   Full           Yes      Yes                                 +---------+---------------+---------+-----------+----------+--------------+ FV DistalFull           Yes      Yes                                 +---------+---------------+---------+-----------+----------+--------------+  PFV      Full                                                         +---------+---------------+---------+-----------+----------+--------------+ POP      Full           Yes      Yes                                 +---------+---------------+---------+-----------+----------+--------------+ PTV      Full                                                        +---------+---------------+---------+-----------+----------+--------------+ PERO     Full                                                        +---------+---------------+---------+-----------+----------+--------------+     Summary: BILATERAL: - No evidence of deep vein thrombosis seen in the lower extremities, bilaterally. - RIGHT: - No cystic structure found in the popliteal fossa.  LEFT: - A cystic structure is found in the popliteal fossa.  *See table(s) above for measurements and observations. Electronically signed by Jamelle Haring on 05/29/2020 at 5:53:03 PM.    Final       Scheduled Meds: . acidophilus  1 capsule Oral TID  . aspirin EC  81 mg Oral Daily  . enoxaparin (LOVENOX) injection  40 mg Subcutaneous Q24H  . ezetimibe  10 mg Oral Daily  . famotidine  20 mg Oral Daily  . ferrous sulfate  325 mg Oral Daily  . isosorbide mononitrate  30 mg Oral Daily  . levothyroxine  125 mcg Oral Q0600  . multivitamin with minerals  1 tablet Oral Daily  . potassium chloride  40 mEq Oral BID  . vancomycin  125 mg Oral QID   Continuous Infusions:    LOS: 3 days      Time spent: 25 minutes   Dessa Phi, DO [facility] 05/30/2020, 12:20 PM   Available via Epic secure chat 7am-7pm After these hours, please refer to coverage provider listed on [email or web address]

## 2020-05-30 NOTE — Progress Notes (Signed)
Physical Therapy Treatment Patient Details Name: Gary Gonzalez MRN: [id number] DOB: 30-Jul-1926 Today's Date: 05/30/2020    History of Present Illness 87 yo male admitted with C-diff, weakness. Hx of recent COVID, LBBB, CAD, CABG, PVC, NSVT, gout, fibromyalgia, sleep apnea    PT Comments    Pt assisted with ambulating in hallway however fatigued quickly.  Pt currently requiring at least min assist at times.  Pt and daughter plan for pt to d/c home.  Recommend initial supervision for safety upon d/c due to fall risk and HHPT.   Follow Up Recommendations  Home health PT;Supervision for mobility/OOB     Equipment Recommendations  None recommended by PT    Recommendations for Other Services       Precautions / Restrictions Precautions Precautions: Fall    Mobility  Bed Mobility Overal bed mobility: Needs Assistance Bed Mobility: Supine to Sit;Rolling Rolling: Supervision   Supine to sit: Supervision;HOB elevated     General bed mobility comments: pt with mucous like BM in bed, so assisted with pericare prior to mobilizing  Transfers Overall transfer level: Needs assistance Equipment used: Rolling walker (2 wheeled) Transfers: Sit to/from Stand Sit to Stand: Min assist Stand pivot transfers: Min assist       General transfer comment: slight assist to rise and steady, cue sfor hand placement  Ambulation/Gait Ambulation/Gait assistance: Min guard;Min assist Gait Distance (Feet): 160 Feet Assistive device: Rolling walker (2 wheeled) Gait Pattern/deviations: Step-through pattern;Decreased stride length     General Gait Details: initially min/guard assist however min assist upon returning to room due to fatigue   Stairs             Wheelchair Mobility    Modified Rankin (Stroke Patients Only)       Balance Overall balance assessment: Needs assistance   Sitting balance-Leahy Scale: Good     Standing balance support: Bilateral upper extremity  supported Standing balance-Leahy Scale: Poor                              Cognition Arousal/Alertness: Awake/alert Behavior During Therapy: WFL for tasks assessed/performed Overall Cognitive Status: Within Functional Limits for tasks assessed                                        Exercises Other Exercises Other Exercises: marching in chair x 20    General Comments        Pertinent Vitals/Pain Pain Assessment: No/denies pain    Home Living Family/patient expects to be discharged to:: Private residence Living Arrangements: Children Available Help at Discharge: Available 24 hours/day (for @ 3 weeks) Type of Home: House Home Access: Stairs to enter   Home Layout: One level Home Equipment: Environmental consultant - 4 wheels;Shower seat;Grab bars - tub/shower      Prior Function Level of Independence: Independent with assistive device(s)      Comments: with rollator; drives; does his own Nurse, children's; likes to read; play cards   PT Goals (current goals can now be found in the care plan section) Acute Rehab PT Goals Patient Stated Goal: to get back to baseline Progress towards PT goals: Progressing toward goals    Frequency    Min 3X/week      PT Plan Current plan remains appropriate    Co-evaluation  AM-PAC PT "6 Clicks" Mobility   Outcome Measure  Help needed turning from your back to your side while in a flat bed without using bedrails?: A Little Help needed moving from lying on your back to sitting on the side of a flat bed without using bedrails?: A Little Help needed moving to and from a bed to a chair (including a wheelchair)?: A Little Help needed standing up from a chair using your arms (e.g., wheelchair or bedside chair)?: A Little Help needed to walk in hospital room?: A Little Help needed climbing 3-5 steps with a railing? : A Lot 6 Click Score: 17    End of Session Equipment Utilized During  Treatment: Gait belt Activity Tolerance: Patient limited by fatigue Patient left: with call bell/phone within reach;with family/visitor present;in chair Nurse Communication: Mobility status PT Visit Diagnosis: Muscle weakness (generalized) (M62.81);Unsteadiness on feet (R26.81)     Time: 5053-9767 PT Time Calculation (min) (ACUTE ONLY): 19 min  Charges:  $Gait Training: 8-22 mins                    Jannette Spanner PT, DPT Acute Rehabilitation Services Pager: [phone] Office: [phone]  York Ram E 05/30/2020, 3:27 PM

## 2020-05-30 NOTE — Progress Notes (Signed)
Occupational Therapy Evaluation  PTA pt lives with his wife and is modified independent with mobility and ADL using his rollator. Pt with recent illnesses and is overall deconditioned and at risk for falls. Pt with near fall with OT, requiring Mod A to prevent fall. Daughter present during session and plan is for pt to DC home. Educated daughter on need for direct physical assistance with ADL and mobilty after DC. Daughter states family will be able to provide adequate assistance. Recommend HHOT. VSS during session. Will follow acutely.     05/30/20 1300  OT Visit Information  Last OT Received On 05/30/20  Assistance Needed +1  History of Present Illness 87 yo male admitted with C-diff, weakness. Hx of recent COVID, LBBB, CAD, CABG, PVC, NSVT, gout, fibromyalgia, sleep apnea  Precautions  Precautions Fall  Home Living  Family/patient expects to be discharged to: Private residence  Living Arrangements Children  Available Help at Discharge Available 24 hours/day (for @ 3 weeks)  Type of [place] to enter  Entrance Stairs-Number of Steps 1  [place] One level  Engineer, manufacturing systems Yes  How Accessible Accessible via [place] - 4 wheels;Shower seat;Grab bars - tub/shower  Prior Function  Level of Independence Independent with assistive device(s)  Comments with rollator; drives; does his own Nurse, children's; likes to read; play cards  Communication  Communication No difficulties  Pain Assessment  Pain Assessment No/denies pain  Cognition  Arousal/Alertness Awake/alert  Behavior During Therapy WFL for tasks assessed/performed  Overall Cognitive Status Within Functional Limits for tasks assessed  Upper Extremity Assessment  Upper Extremity Assessment Generalized weakness  Lower Extremity Assessment  Lower Extremity Assessment Defer to PT evaluation   Cervical / Trunk Assessment  Cervical / Trunk Assessment Normal  ADL  Overall ADL's  Needs assistance/impaired  Grooming Set up;Sitting  Upper Body Bathing Set up;Sitting  Lower Body Bathing Minimal assistance;Sit to/from stand  Upper Body Dressing  Set up;Sitting  Lower Body Dressing Minimal assistance;Sit to/from Retail buyer Minimal assistance;Ambulation;Grab bars;RW  Toileting- Clothing Manipulation and Hygiene Minimal assistance;Sit to/from stand  Functional mobility during ADLs Minimal assistance;Rolling walker;Cueing for safety  General ADL Comments easily fatigues  Bed Mobility  Overal bed mobility Needs Assistance  Supine to sit Supervision;HOB elevated  Transfers  Overall transfer level Needs assistance  Transfers Sit to/from Stand;Stand Pivot Transfers  Sit to Stand Min assist  Stand pivot transfers Min assist  Balance  Overall balance assessment Needs assistance  Sitting balance-Leahy Scale Good  Standing balance-Leahy Scale Poor  Exercises  Exercises Other exercises  Other Exercises  Other Exercises marching in chair x 20  OT - End of Session  Equipment Utilized During Treatment Gait belt;Rolling walker  Activity Tolerance Patient tolerated treatment well  Patient left in chair;with call bell/phone within reach;with chair alarm set;with family/visitor present  Nurse Communication Mobility status  OT Assessment  OT Recommendation/Assessment Patient needs continued OT Services  OT Visit Diagnosis Unsteadiness on feet (R26.81);Other abnormalities of gait and mobility (R26.89);Muscle weakness (generalized) (M62.81)  OT Problem List Decreased strength;Decreased activity tolerance;Impaired balance (sitting and/or standing);Decreased safety awareness;Decreased knowledge of use of DME or AE  OT Plan  OT Frequency (ACUTE ONLY) Min 2X/week  OT Treatment/Interventions (ACUTE ONLY) Self-care/ADL training;Therapeutic exercise;Neuromuscular education;Energy  conservation;DME and/or AE instruction;Therapeutic activities;Patient/family education;Balance training  AM-PAC OT "6 Clicks" Daily Activity Outcome Measure (Version 2)  Help from another  person eating meals? 4  Help from another person taking care of personal grooming? 3  Help from another person toileting, which includes using toliet, bedpan, or urinal? 3  Help from another person bathing (including washing, rinsing, drying)? 3  Help from another person to put on and taking off regular upper body clothing? 3  Help from another person to put on and taking off regular lower body clothing? 3  6 Click Score 19  OT Recommendation  Follow Up Recommendations Home health OT;Supervision/Assistance - 24 hour  OT Equipment None recommended by OT  Individuals Consulted  Consulted and Agree with Results and Recommendations Patient;Family member/caregiver  Family Member Consulted daughter  Acute Rehab OT Goals  Patient Stated Goal to get back to baseline  OT Goal Formulation With patient/family  Time For Goal Achievement 06/13/20  Potential to Achieve Goals Good  OT Time Calculation  OT Start Time (ACUTE ONLY) 1330  OT Stop Time (ACUTE ONLY) 1400  OT Time Calculation (min) 30 min  OT General Charges  $OT Visit 1 Visit  OT Evaluation  $OT Eval Moderate Complexity 1 Mod  OT Treatments  $Self Care/Home Management  8-22 mins  Written Expression  Dominant Hand Right  Maurie Boettcher, OT/L   Acute OT Clinical Specialist [place] Pager [phone] Office [phone]

## 2020-05-31 LAB — CBC WITH DIFFERENTIAL/PLATELET
Abs Immature Granulocytes: 0.27 10*3/uL — ABNORMAL HIGH (ref 0.00–0.07)
Basophils Absolute: 0.1 10*3/uL (ref 0.0–0.1)
Basophils Relative: 1 %
Eosinophils Absolute: 0.3 10*3/uL (ref 0.0–0.5)
Eosinophils Relative: 4 %
HCT: 28 % — ABNORMAL LOW (ref 39.0–52.0)
Hemoglobin: 8.9 g/dL — ABNORMAL LOW (ref 13.0–17.0)
Immature Granulocytes: 3 %
Lymphocytes Relative: 16 %
Lymphs Abs: 1.4 10*3/uL (ref 0.7–4.0)
MCH: 29.3 pg (ref 26.0–34.0)
MCHC: 31.8 g/dL (ref 30.0–36.0)
MCV: 92.1 fL (ref 80.0–100.0)
Monocytes Absolute: 0.8 10*3/uL (ref 0.1–1.0)
Monocytes Relative: 10 %
Neutro Abs: 5.6 10*3/uL (ref 1.7–7.7)
Neutrophils Relative %: 66 %
Platelets: 375 10*3/uL (ref 150–400)
RBC: 3.04 MIL/uL — ABNORMAL LOW (ref 4.22–5.81)
RDW: 13.8 % (ref 11.5–15.5)
WBC: 8.5 10*3/uL (ref 4.0–10.5)
nRBC: 0 % (ref 0.0–0.2)

## 2020-05-31 LAB — COMPREHENSIVE METABOLIC PANEL
ALT: 14 U/L (ref 0–44)
AST: 18 U/L (ref 15–41)
Albumin: 2 g/dL — ABNORMAL LOW (ref 3.5–5.0)
Alkaline Phosphatase: 71 U/L (ref 38–126)
Anion gap: 8 (ref 5–15)
BUN: 15 mg/dL (ref 8–23)
CO2: 20 mmol/L — ABNORMAL LOW (ref 22–32)
Calcium: 7.7 mg/dL — ABNORMAL LOW (ref 8.9–10.3)
Chloride: 114 mmol/L — ABNORMAL HIGH (ref 98–111)
Creatinine, Ser: 1.06 mg/dL (ref 0.61–1.24)
GFR, Estimated: >60 mL/min (ref >60)
Glucose, Bld: 93 mg/dL (ref 70–99)
Potassium: 3.7 mmol/L (ref 3.5–5.1)
Sodium: 142 mmol/L (ref 135–145)
Total Bilirubin: 0.5 mg/dL (ref 0.3–1.2)
Total Protein: 4.9 g/dL — ABNORMAL LOW (ref 6.5–8.1)

## 2020-05-31 LAB — D-DIMER, QUANTITATIVE: D-Dimer, Quant: >20 ug/mL-FEU — ABNORMAL HIGH (ref 0.00–0.50)

## 2020-05-31 LAB — C-REACTIVE PROTEIN: CRP: 7 mg/dL — ABNORMAL HIGH (ref <1.0)

## 2020-05-31 LAB — MAGNESIUM: Magnesium: 1.9 mg/dL (ref 1.7–2.4)

## 2020-05-31 NOTE — Progress Notes (Addendum)
PROGRESS NOTE    Gary Gonzalez  WCB:[id number] DOB: [birth year] DOA: 05/26/2020 PCP: Gary Infante, MD     Brief Narrative:  Gary Gonzalez a 87 y.o.malewithhistory of CAD status post CABG, PVC induced cardiomyopathy last EF in 2018 was normalized with grade 1 diastolic dysfunction follows with Gary Gonzalez cardiologist, who was recently admitted for cholecystectomy following which patient also was readmitted again for diverticulitis and covid. He was discharged home on May 16, 2020 and was taking oral antibiotics until 4 days ago. After completion of patient antibiotic course, patient started having fever, chills and got weak. He tested positive for C Diff on admission, started on PO vanco.   New events last 24 hours / Subjective: States that he had to be cleaned up twice last night but was pleased to be able to eat more, had some chicken and mashed potatoes. Worked with PT. No new complaints, denies CP, SOB.   Assessment & Plan:   Principal Problem:   C. difficile diarrhea Active Problems:   CAD (coronary artery disease)   PVC's (premature ventricular contractions)   S/P CABG x 2   CKD (chronic kidney disease), stage III (HCC)   Chronic combined systolic and diastolic CHF (congestive heart failure) (HCC)   Hypothyroidism (acquired)   Generalized weakness   C. Difficile -Tested positive for C. difficile on admission 1/28.  Oral vancomycin started 1/28  -CT abdomen pelvis revealed diverticulosis of the sigmoid colon with diffuse wall thickening, cannot exclude early diverticulitis and was empirically started on Zosyn, I will discontinue antibiotics for now and monitor on oral vancomycin -Leukocytosis resolved  CAD status post CABG -Continue aspirin, Zetia, Imdur  Demand ischemia -Troponin trend has been flat 21 >> 21 >> 24 >> 25 >> 25 -Likely secondary to acute infection as above -No complaints of chest pain   History of PVC induced cardiomyopathy -Continue to monitor on  telemetry  Hypothyroidism -Continue Synthroid  Recent Covid infection -Tested positive 1/15 -Completed 3 days of Remdesivir -Patient has no respiratory complaints, he is out of the isolation window -CT chest: Emphysematous changes and fibrosis in the lungs. Atelectasis in the lung bases. Mild peripheral bronchiectasis in the bases. No focal consolidation.  Elevated d-dimer -Had trended down after COVID but now trended up. He has had 3 hospitalizations, with recent surgery. CTA chest and venous duplex neg for VTE. Continue to trend, ?due to infectious process.    In agreement with assessment of the pressure ulcer as below:  Pressure Injury 05/27/20 Sacrum Medial;Lower  blanchable; moisture associated (Active)  05/27/20 2300  Location: Sacrum  Location Orientation: Medial;Lower  Staging:   Wound Description (Comments): blanchable; moisture associated  Present on Admission:      Nutrition Problem: Inadequate oral intake Etiology: decreased appetite   DVT prophylaxis:  enoxaparin (LOVENOX) injection 40 mg Start: 05/27/20 1000  Code Status: Full Family Communication: No family at bedside; updated daughter over the phone 2/1  Disposition Plan:  Status is: Inpatient  Remains inpatient appropriate because:Inpatient level of care appropriate due to severity of illness   Dispo: The patient is from: Home              Anticipated d/c is to: Home              Anticipated d/c date is: 2 days              Patient currently is not medically stable to d/c.  Continue to monitor D-dimer for improvement, improvement in stools  Difficult to place patient No   Antimicrobials:  Anti-infectives (From admission, onward)   Start     Dose/Rate Route Frequency Ordered Stop   05/27/20 1100  vancomycin (VANCOCIN) 50 mg/mL oral solution 125 mg        125 mg Oral 4 times daily 05/27/20 1037 06/06/20 0959   05/27/20 0600  piperacillin-tazobactam (ZOSYN) IVPB 3.375 g  Status:  Discontinued         3.375 g 12.5 mL/hr over 240 Minutes Intravenous Every 8 hours 05/27/20 0026 05/27/20 1037   05/26/20 2100  piperacillin-tazobactam (ZOSYN) IVPB 3.375 g        3.375 g 100 mL/hr over 30 Minutes Intravenous  Once 05/26/20 2058 05/26/20 2255       Objective: Vitals:   05/30/20 0521 05/30/20 1426 05/30/20 2212 05/31/20 0516  BP: 131/63 133/83 (!) 117/58 123/69  Pulse: 67 78 77 74  Resp: 18 20 20 20   Temp: (!) 97.5 F (36.4 C)  97.8 F (36.6 C) 97.7 F (36.5 C)  TempSrc:    Oral  SpO2: 95% 97% 96% 96%  Weight:      Height:        Intake/Output Summary (Last 24 hours) at 05/31/2020 1036 Last data filed at 05/31/2020 0500 Gross per 24 hour  Intake --  Output 775 ml  Net -775 ml   Filed Weights   05/26/20 1606  Weight: 71 kg    Examination: General exam: Appears calm and comfortable  Respiratory system: Clear to auscultation. Respiratory effort normal. Cardiovascular system: S1 & S2 heard, RRR. No pedal edema. Gastrointestinal system: Abdomen is nondistended, soft and nontender. Normal bowel sounds heard. Central nervous system: Alert and oriented. Non focal exam. Speech clear  Extremities: Symmetric in appearance bilaterally  Skin: No rashes, lesions or ulcers on exposed skin  Psychiatry: Judgement and insight appear stable. Mood & affect appropriate.    Data Reviewed: I have personally reviewed following labs and imaging studies  CBC: Recent Labs  Lab 05/27/20 0834 05/28/20 0545 05/29/20 0638 05/30/20 0512 05/31/20 0537  WBC 25.9* 16.9* 13.9* 10.1 8.5  NEUTROABS 23.3* 14.8* 11.4* 7.8* 5.6  HGB 9.5* 7.9* 7.8* 8.6* 8.9*  HCT 29.2* 25.0* 24.9* 27.2* 28.0*  MCV 92.1 93.6 94.7 92.8 92.1  PLT 342 307 285 332 [id number]   Basic Metabolic Panel: Recent Labs  Lab 05/27/20 0834 05/28/20 0545 05/29/20 0638 05/30/20 0512 05/31/20 0537  NA 136 141 139 141 142  K 4.1 3.3* 3.0* 3.2* 3.7  CL 104 108 110 112* 114*  CO2 24 22 21* 20* 20*  GLUCOSE 120* 96 76 87 93  BUN 16  19 18 19 15   CREATININE 1.09 1.27* 1.11 1.00 1.06  CALCIUM 7.8* 7.5* 7.4* 7.7* 7.7*  MG  --   --   --  1.9 1.9   GFR: Estimated Creatinine Clearance: 45.6 mL/min (by C-G formula based on SCr of 1.06 mg/dL). Liver Function Tests: Recent Labs  Lab 05/27/20 0834 05/28/20 0545 05/29/20 0638 05/30/20 0512 05/31/20 0537  AST 23 15 14* 15 18  ALT 18 15 13 12 14   ALKPHOS 91 76 75 78 71  BILITOT 0.9 0.6 0.4 0.4 0.5  PROT 5.9* 5.1* 5.0* 4.9* 4.9*  ALBUMIN 2.3* 2.2* 2.1* 1.9* 2.0*   No results for input(s): LIPASE, AMYLASE in the last 168 hours. No results for input(s): AMMONIA in the last 168 hours. Coagulation Profile: No results for input(s): INR, PROTIME in the last 168 hours. Cardiac Enzymes: No results  for input(s): CKTOTAL, CKMB, CKMBINDEX, TROPONINI in the last 168 hours. BNP (last 3 results) No results for input(s): PROBNP in the last 8760 hours. HbA1C: No results for input(s): HGBA1C in the last 72 hours. CBG: No results for input(s): GLUCAP in the last 168 hours. Lipid Profile: No results for input(s): CHOL, HDL, LDLCALC, TRIG, CHOLHDL, LDLDIRECT in the last 72 hours. Thyroid Function Tests: No results for input(s): TSH, T4TOTAL, FREET4, T3FREE, THYROIDAB in the last 72 hours. Anemia Panel: No results for input(s): VITAMINB12, FOLATE, FERRITIN, TIBC, IRON, RETICCTPCT in the last 72 hours. Sepsis Labs: Recent Labs  Lab 05/27/20 0010  PROCALCITON <0.10    Recent Results (from the past 240 hour(s))  Culture, blood (routine x 2)     Status: None (Preliminary result)   Collection Time: 05/26/20 12:10 AM   Specimen: BLOOD RIGHT FOREARM  Result Value Ref Range Status   Specimen Description   Final    BLOOD RIGHT FOREARM Performed at [facility] [address] [place], [place] [address]    Special Requests   Final    BOTTLES DRAWN AEROBIC AND ANAEROBIC Blood Culture results may not be optimal due to an inadequate volume of blood received in  culture bottles Performed at [facility] [address], [place], [place] [address]    Culture   Final    NO GROWTH 3 DAYS Performed at [facility], [place] [address] [place], [place] [address]    Report Status PENDING  Incomplete  C Difficile Quick Screen w PCR reflex     Status: Abnormal   Collection Time: 05/27/20  9:06 AM   Specimen: STOOL  Result Value Ref Range Status   C Diff antigen POSITIVE (A) NEGATIVE Final   C Diff toxin POSITIVE (A) NEGATIVE Final   C Diff interpretation Toxin producing C. difficile detected.  Final    Comment: CRITICAL RESULT CALLED TO, READ BACK BY AND VERIFIED WITH: BANNO,A @ 1009 ON [id number] BY POTEAT,S Performed at [facility] [address] [place], [place] [address]   Gastrointestinal Panel by PCR , Stool     Status: None   Collection Time: 05/27/20  9:06 AM   Specimen: STOOL  Result Value Ref Range Status   Campylobacter species NOT DETECTED NOT DETECTED Final   Plesimonas shigelloides NOT DETECTED NOT DETECTED Final   Salmonella species NOT DETECTED NOT DETECTED Final   Yersinia enterocolitica NOT DETECTED NOT DETECTED Final   Vibrio species NOT DETECTED NOT DETECTED Final   Vibrio cholerae NOT DETECTED NOT DETECTED Final   Enteroaggregative E coli (EAEC) NOT DETECTED NOT DETECTED Final   Enteropathogenic E coli (EPEC) NOT DETECTED NOT DETECTED Final   Enterotoxigenic E coli (ETEC) NOT DETECTED NOT DETECTED Final   Shiga like toxin producing E coli (STEC) NOT DETECTED NOT DETECTED Final   Shigella/Enteroinvasive E coli (EIEC) NOT DETECTED NOT DETECTED Final   Cryptosporidium NOT DETECTED NOT DETECTED Final   Cyclospora cayetanensis NOT DETECTED NOT DETECTED Final   Entamoeba histolytica NOT DETECTED NOT DETECTED Final   Giardia lamblia NOT DETECTED NOT DETECTED Final   Adenovirus F40/41 NOT DETECTED NOT DETECTED Final   Astrovirus NOT DETECTED NOT DETECTED Final   Norovirus GI/GII NOT DETECTED  NOT DETECTED Final   Rotavirus A NOT DETECTED NOT DETECTED Final   Sapovirus (I, II, IV, and V) NOT DETECTED NOT DETECTED Final    Comment: Performed at [facility], [place], [place]  [place]      Radiology Studies: No results found.    Scheduled Meds: . acidophilus  1 capsule Oral TID  . aspirin EC  81 mg Oral Daily  . enoxaparin (LOVENOX) injection  40 mg Subcutaneous Q24H  . ezetimibe  10 mg Oral Daily  . famotidine  20 mg Oral Daily  . ferrous sulfate  325 mg Oral Daily  . isosorbide mononitrate  30 mg Oral Daily  . levothyroxine  125 mcg Oral Q0600  . multivitamin with minerals  1 tablet Oral Daily  . vancomycin  125 mg Oral QID   Continuous Infusions:    LOS: 4 days      Time spent: 20 minutes   Dessa Phi, DO [facility] 05/31/2020, 10:36 AM   Available via Epic secure chat 7am-7pm After these hours, please refer to coverage provider listed on [email or web address]

## 2020-06-01 LAB — BASIC METABOLIC PANEL
Anion gap: 8 (ref 5–15)
BUN: 13 mg/dL (ref 8–23)
CO2: 22 mmol/L (ref 22–32)
Calcium: 7.7 mg/dL — ABNORMAL LOW (ref 8.9–10.3)
Chloride: 111 mmol/L (ref 98–111)
Creatinine, Ser: 1.17 mg/dL (ref 0.61–1.24)
GFR, Estimated: 59 mL/min — ABNORMAL LOW (ref >60)
Glucose, Bld: 93 mg/dL (ref 70–99)
Potassium: 4 mmol/L (ref 3.5–5.1)
Sodium: 141 mmol/L (ref 135–145)

## 2020-06-01 LAB — CULTURE, BLOOD (ROUTINE X 2): Culture: NO GROWTH

## 2020-06-01 LAB — CBC
HCT: 29.1 % — ABNORMAL LOW (ref 39.0–52.0)
Hemoglobin: 9.3 g/dL — ABNORMAL LOW (ref 13.0–17.0)
MCH: 29.2 pg (ref 26.0–34.0)
MCHC: 32 g/dL (ref 30.0–36.0)
MCV: 91.5 fL (ref 80.0–100.0)
Platelets: 406 10*3/uL — ABNORMAL HIGH (ref 150–400)
RBC: 3.18 MIL/uL — ABNORMAL LOW (ref 4.22–5.81)
RDW: 13.9 % (ref 11.5–15.5)
WBC: 9.7 10*3/uL (ref 4.0–10.5)
nRBC: 0 % (ref 0.0–0.2)

## 2020-06-01 LAB — D-DIMER, QUANTITATIVE: D-Dimer, Quant: >20 ug/mL-FEU — ABNORMAL HIGH (ref 0.00–0.50)

## 2020-06-01 LAB — C-REACTIVE PROTEIN: CRP: 3.3 mg/dL — ABNORMAL HIGH (ref <1.0)

## 2020-06-01 NOTE — Progress Notes (Signed)
PROGRESS NOTE  Gary Gonzalez [id number] DOB: [birth year] DOA: 05/26/2020 PCP: [name], MD  HPI/Recap of past 24 hours: Gary Gonzalez a 87 y.o.malewithhistory of CAD status post CABG, PVC induced cardiomyopathy last EF in 2018 was normalized with grade 1 diastolic dysfunction follows with Dr. [name] cardiologist, who wasrecently admitted for cholecystectomy following which patient also was readmitted again for diverticulitisand covid. He wasdischarged home on May 16, 2020 and was taking oral antibiotics until 4 days ago. After completion of patient antibiotic course,patient started having fever,chills and gotweak.He tested positive for C Diff on admission, started on PO vanco.   06/01/20: He feels better this morning.  Has less frequency of bowel movements.  Denies any abdominal pain.  Tolerating a diet.  Assessment/Plan: Principal Problem:   C. difficile diarrhea Active Problems:   CAD (coronary artery disease)   PVC's (premature ventricular contractions)   S/P CABG x 2   CKD (chronic kidney disease), stage III (HCC)   Chronic combined systolic and diastolic CHF (congestive heart failure) (HCC)   Hypothyroidism (acquired)   Generalized weakness  C. Difficile, POA. -Tested positive for C. difficile on admission 05/27/20. Oral vancomycin started 1/28  -CT abdomen pelvis revealed diverticulosis of the sigmoid colon with diffuse wall thickening, cannot exclude early diverticulitis and was empirically started on Zosyn, I will discontinueantibiotics for nowand monitor on oral vancomycin Continue oral vancomycin Monitor stool output -Leukocytosis resolved, afebrile.  CAD status post CABG -Continue aspirin, Zetia, Imdur  Elevated troponin secondary to demand ischemia in the setting of acute infection Denies any anginal symptoms. Continue to monitor on telemetry.  History of PVC induced cardiomyopathy -Continue to monitor on telemetry  Hypothyroidism TSH  0.536 -Continue home Synthroid  Recent Covid infection -Tested positive1/15 -Completed 3 days of Remdesivir -Patient has no respiratory complaints, he is out of the isolation window -CT chest:Emphysematous changes and fibrosis in the lungs. Atelectasis in the lung bases. Mild peripheral bronchiectasis in the bases. No focal consolidation.  Elevated d-dimer -Had trended down after COVID but now trended up. He has had 3 hospitalizations, with recent surgery. CTA chest and venous duplex neg for VTE.  D-dimer>20 Repeat D-dimer in the morning   In agreement with assessment of the pressure ulcer as below:  Pressure Injury 05/27/20 Sacrum Medial;Lower  blanchable; moisture associated (Active)  05/27/20 2300  Location: Sacrum  Location Orientation: Medial;Lower  Staging:   Wound Description (Comments): blanchable; moisture associated  Present on Admission:      Nutrition Problem: Inadequate oral intake Etiology: decreased appetite   DVT prophylaxis:  enoxaparin (LOVENOX) injection 40 mg Start: 05/27/20 1000  Code Status: Full Family Communication:  Updated his daughter at bedside.   Status is: Inpatient    Dispo: The patient is from: Home.              Anticipated d/c is to: Home.               Anticipated d/c date is: 06/03/2020.              Patient currently Not stable for discharge due to ongoing management of C. Difficile infection.   difficult to place patient        Objective: Vitals:   05/31/20 2212 06/01/20 0512 06/01/20 0516 06/01/20 1234  BP: 124/60 (!) 120/59 (!) 120/59 (!) 141/68  Pulse: 71 71 71 69  Resp: 18 20  20   Temp: 97.6 F (36.4 C) 97.6 F (36.4 C) 97.8 F (36.6 C) 97.6 F (  36.4 C)  TempSrc:  Oral Oral Oral  SpO2: 93% 97% 97% 99%  Weight:      Height:        Intake/Output Summary (Last 24 hours) at 06/01/2020 1712 Last data filed at 06/01/2020 0600 Gross per 24 hour  Intake 120 ml  Output 650 ml  Net -530 ml   Filed  Weights   05/26/20 1606  Weight: 71 kg    Exam:  . General: 87 y.o. year-old male well developed well nourished in no acute distress.  Alert and oriented x3. . Cardiovascular: Regular rate and rhythm with no rubs or gallops.  No thyromegaly or JVD noted.   Marland Kitchen Respiratory: Clear to auscultation with no wheezes or rales. Good inspiratory effort. . Abdomen: Soft nontender nondistended with normal bowel sounds x4 quadrants. . Musculoskeletal: No lower extremity edema. 2/4 pulses in all 4 extremities. . Skin: No ulcerative lesions noted or rashes . Psychiatry: Mood is appropriate for condition and setting   Data Reviewed: CBC: Recent Labs  Lab 05/27/20 0834 05/28/20 0545 05/29/20 8119 05/30/20 0512 05/31/20 0537 06/01/20 0613  WBC 25.9* 16.9* 13.9* 10.1 8.5 9.7  NEUTROABS 23.3* 14.8* 11.4* 7.8* 5.6  --   HGB 9.5* 7.9* 7.8* 8.6* 8.9* 9.3*  HCT 29.2* 25.0* 24.9* 27.2* 28.0* 29.1*  MCV 92.1 93.6 94.7 92.8 92.1 91.5  PLT 342 307 285 332 375 147*   Basic Metabolic Panel: Recent Labs  Lab 05/28/20 0545 05/29/20 0638 05/30/20 0512 05/31/20 0537 06/01/20 0613  NA 141 139 141 142 141  K 3.3* 3.0* 3.2* 3.7 4.0  CL 108 110 112* 114* 111  CO2 22 21* 20* 20* 22  GLUCOSE 96 76 87 93 93  BUN 19 18 19 15 13   CREATININE 1.27* 1.11 1.00 1.06 1.17  CALCIUM 7.5* 7.4* 7.7* 7.7* 7.7*  MG  --   --  1.9 1.9  --    GFR: Estimated Creatinine Clearance: 41.3 mL/min (by C-G formula based on SCr of 1.17 mg/dL). Liver Function Tests: Recent Labs  Lab 05/27/20 0834 05/28/20 0545 05/29/20 0638 05/30/20 0512 05/31/20 0537  AST 23 15 14* 15 18  ALT 18 15 13 12 14   ALKPHOS 91 76 75 78 71  BILITOT 0.9 0.6 0.4 0.4 0.5  PROT 5.9* 5.1* 5.0* 4.9* 4.9*  ALBUMIN 2.3* 2.2* 2.1* 1.9* 2.0*   No results for input(s): LIPASE, AMYLASE in the last 168 hours. No results for input(s): AMMONIA in the last 168 hours. Coagulation Profile: No results for input(s): INR, PROTIME in the last 168  hours. Cardiac Enzymes: No results for input(s): CKTOTAL, CKMB, CKMBINDEX, TROPONINI in the last 168 hours. BNP (last 3 results) No results for input(s): PROBNP in the last 8760 hours. HbA1C: No results for input(s): HGBA1C in the last 72 hours. CBG: No results for input(s): GLUCAP in the last 168 hours. Lipid Profile: No results for input(s): CHOL, HDL, LDLCALC, TRIG, CHOLHDL, LDLDIRECT in the last 72 hours. Thyroid Function Tests: No results for input(s): TSH, T4TOTAL, FREET4, T3FREE, THYROIDAB in the last 72 hours. Anemia Panel: No results for input(s): VITAMINB12, FOLATE, FERRITIN, TIBC, IRON, RETICCTPCT in the last 72 hours. Urine analysis:    Component Value Date/Time   COLORURINE YELLOW 05/26/2020 0036   APPEARANCEUR CLEAR 05/26/2020 0036   LABSPEC 1.028 05/26/2020 0036   PHURINE 6.0 05/26/2020 0036   GLUCOSEU NEGATIVE 05/26/2020 0036   HGBUR NEGATIVE 05/26/2020 0036   BILIRUBINUR NEGATIVE 05/26/2020 0036   KETONESUR NEGATIVE 05/26/2020 0036  PROTEINUR NEGATIVE 05/26/2020 0036   NITRITE NEGATIVE 05/26/2020 0036   LEUKOCYTESUR NEGATIVE 05/26/2020 0036   Sepsis Labs: @LABRCNTIP (procalcitonin:4,lacticidven:4)  ) Recent Results (from the past 240 hour(s))  Culture, blood (routine x 2)     Status: None   Collection Time: 05/26/20 12:10 AM   Specimen: BLOOD RIGHT FOREARM  Result Value Ref Range Status   Specimen Description   Final    BLOOD RIGHT FOREARM Performed at [facility] [address], [place], [place] [address]    Special Requests   Final    BOTTLES DRAWN AEROBIC AND ANAEROBIC Blood Culture results may not be optimal due to an inadequate volume of blood received in culture bottles Performed at [facility] [address], [place], [place] [address]    Culture   Final    NO GROWTH 5 DAYS Performed at [facility], [place] [address], [place], [place] [address]    Report Status 06/01/2020 FINAL  Final  C  Difficile Quick Screen w PCR reflex     Status: Abnormal   Collection Time: 05/27/20  9:06 AM   Specimen: STOOL  Result Value Ref Range Status   C Diff antigen POSITIVE (A) NEGATIVE Final   C Diff toxin POSITIVE (A) NEGATIVE Final   C Diff interpretation Toxin producing C. difficile detected.  Final    Comment: CRITICAL RESULT CALLED TO, READ BACK BY AND VERIFIED WITH: BANNO,A @ 1009 ON [id number] BY POTEAT,S Performed at [facility] [address] [place], [place] [address]   Gastrointestinal Panel by PCR , Stool     Status: None   Collection Time: 05/27/20  9:06 AM   Specimen: STOOL  Result Value Ref Range Status   Campylobacter species NOT DETECTED NOT DETECTED Final   Plesimonas shigelloides NOT DETECTED NOT DETECTED Final   Salmonella species NOT DETECTED NOT DETECTED Final   Yersinia enterocolitica NOT DETECTED NOT DETECTED Final   Vibrio species NOT DETECTED NOT DETECTED Final   Vibrio cholerae NOT DETECTED NOT DETECTED Final   Enteroaggregative E coli (EAEC) NOT DETECTED NOT DETECTED Final   Enteropathogenic E coli (EPEC) NOT DETECTED NOT DETECTED Final   Enterotoxigenic E coli (ETEC) NOT DETECTED NOT DETECTED Final   Shiga like toxin producing E coli (STEC) NOT DETECTED NOT DETECTED Final   Shigella/Enteroinvasive E coli (EIEC) NOT DETECTED NOT DETECTED Final   Cryptosporidium NOT DETECTED NOT DETECTED Final   Cyclospora cayetanensis NOT DETECTED NOT DETECTED Final   Entamoeba histolytica NOT DETECTED NOT DETECTED Final   Giardia lamblia NOT DETECTED NOT DETECTED Final   Adenovirus F40/41 NOT DETECTED NOT DETECTED Final   Astrovirus NOT DETECTED NOT DETECTED Final   Norovirus GI/GII NOT DETECTED NOT DETECTED Final   Rotavirus A NOT DETECTED NOT DETECTED Final   Sapovirus (I, II, IV, and V) NOT DETECTED NOT DETECTED Final    Comment: Performed at [facility], [address] [place], [place] [address]      Studies: No results  found.  Scheduled Meds: . acidophilus  1 capsule Oral TID  . aspirin EC  81 mg Oral Daily  . enoxaparin (LOVENOX) injection  40 mg Subcutaneous Q24H  . ezetimibe  10 mg Oral Daily  . famotidine  20 mg Oral Daily  . ferrous sulfate  325 mg Oral Daily  . isosorbide mononitrate  30 mg Oral Daily  . levothyroxine  125 mcg Oral Q0600  . multivitamin with minerals  1 tablet Oral  Daily  . vancomycin  125 mg Oral QID    Continuous Infusions:   LOS: 5 days     Kayleen Memos, MD [facility] Pager [phone]  If 7PM-7AM, please contact night-coverage [email or web address] Password [facility] 06/01/2020, 5:12 PM

## 2020-06-01 NOTE — Progress Notes (Signed)
OT Cancellation Note  Patient Details Name: FREDERIC TONES MRN: [id number] DOB: 1927/02/20   Cancelled Treatment:    Reason Eval/Treat Not Completed: Other (comment) (RN advised to return at a later time due to aspiration from self feeding.)  Hoy Morn, [place], [place]  [phone]  06/01/2020, 12:43 PM

## 2020-06-01 NOTE — Progress Notes (Signed)
Physical Therapy Treatment Patient Details Name: Gary Gonzalez MRN: [id number] DOB: Oct 02, 1926 Today's Date: 06/01/2020    History of Present Illness 87 yo male admitted with C-diff, weakness. Hx of recent COVID, LBBB, CAD, CABG, PVC, NSVT, gout, fibromyalgia, sleep apnea    PT Comments    Pt tolerated increased distance with walking today. He ambulated 200' with RW, no loss of balance. He had some incontinence of mucousy BM at end of session.   Follow Up Recommendations  Home health PT;Supervision for mobility/OOB     Equipment Recommendations  None recommended by PT    Recommendations for Other Services       Precautions / Restrictions Precautions Precautions: Fall Restrictions Weight Bearing Restrictions: No    Mobility  Bed Mobility               General bed mobility comments: up in recliner  Transfers Overall transfer level: Needs assistance Equipment used: Rolling walker (2 wheeled) Transfers: Sit to/from Stand Sit to Stand: Min guard         General transfer comment: cues for hand placement  Ambulation/Gait Ambulation/Gait assistance: Min guard Gait Distance (Feet): 200 Feet Assistive device: Rolling walker (2 wheeled) Gait Pattern/deviations: Step-through pattern;Decreased stride length Gait velocity: normal pace   General Gait Details: steady, no loss of balance, incontinent with watery BM at very end of walk   Stairs             Wheelchair Mobility    Modified Rankin (Stroke Patients Only)       Balance     Sitting balance-Leahy Scale: Good     Standing balance support: Bilateral upper extremity supported Standing balance-Leahy Scale: Poor                              Cognition Arousal/Alertness: Awake/alert Behavior During Therapy: WFL for tasks assessed/performed Overall Cognitive Status: Within Functional Limits for tasks assessed                                        Exercises       General Comments        Pertinent Vitals/Pain Pain Assessment: No/denies pain    Home Living                      Prior Function            PT Goals (current goals can now be found in the care plan section) Acute Rehab PT Goals Patient Stated Goal: to get stronger, likes reading, golf, tennis PT Goal Formulation: With patient/family Time For Goal Achievement: 06/11/20 Potential to Achieve Goals: Good Progress towards PT goals: Progressing toward goals    Frequency    Min 3X/week      PT Plan Current plan remains appropriate    Co-evaluation              AM-PAC PT "6 Clicks" Mobility   Outcome Measure  Help needed turning from your back to your side while in a flat bed without using bedrails?: A Little Help needed moving from lying on your back to sitting on the side of a flat bed without using bedrails?: A Little Help needed moving to and from a bed to a chair (including a wheelchair)?: A Little Help needed standing up from a chair using your  arms (e.g., wheelchair or bedside chair)?: A Little Help needed to walk in hospital room?: A Little Help needed climbing 3-5 steps with a railing? : A Lot 6 Click Score: 17    End of Session Equipment Utilized During Treatment: Gait belt Activity Tolerance: Patient tolerated treatment well Patient left: with call bell/phone within reach;with family/visitor present;in chair Nurse Communication: Mobility status PT Visit Diagnosis: Muscle weakness (generalized) (M62.81);Unsteadiness on feet (R26.81)     Time: 1350-1406 PT Time Calculation (min) (ACUTE ONLY): 16 min  Charges:  $Gait Training: 8-22 mins                     Blondell Reveal Kistler PT 06/01/2020  Acute Rehabilitation Services Pager [phone] Office [phone]

## 2020-06-02 LAB — BASIC METABOLIC PANEL
Anion gap: 6 (ref 5–15)
Anion gap: 10 (ref 5–15)
BUN: 14 mg/dL (ref 8–23)
BUN: 14 mg/dL (ref 8–23)
CO2: 25 mmol/L (ref 22–32)
CO2: 24 mmol/L (ref 22–32)
Calcium: 7.9 mg/dL — ABNORMAL LOW (ref 8.9–10.3)
Calcium: 8.1 mg/dL — ABNORMAL LOW (ref 8.9–10.3)
Chloride: 110 mmol/L (ref 98–111)
Chloride: 106 mmol/L (ref 98–111)
Creatinine, Ser: 1.27 mg/dL — ABNORMAL HIGH (ref 0.61–1.24)
Creatinine, Ser: 1.32 mg/dL — ABNORMAL HIGH (ref 0.61–1.24)
GFR, Estimated: 53 mL/min — ABNORMAL LOW (ref >60)
GFR, Estimated: 51 mL/min — ABNORMAL LOW (ref >60)
Glucose, Bld: 95 mg/dL (ref 70–99)
Glucose, Bld: 148 mg/dL — ABNORMAL HIGH (ref 70–99)
Potassium: 3.7 mmol/L (ref 3.5–5.1)
Potassium: 3.9 mmol/L (ref 3.5–5.1)
Sodium: 141 mmol/L (ref 135–145)
Sodium: 140 mmol/L (ref 135–145)

## 2020-06-02 LAB — C-REACTIVE PROTEIN: CRP: 2.3 mg/dL — ABNORMAL HIGH (ref <1.0)

## 2020-06-02 LAB — D-DIMER, QUANTITATIVE: D-Dimer, Quant: 19.81 ug/mL-FEU — ABNORMAL HIGH (ref 0.00–0.50)

## 2020-06-02 MED ORDER — LACTATED RINGERS IV SOLN: INTRAVENOUS | Status: DC

## 2020-06-02 MED ORDER — ENSURE ENLIVE PO LIQD: 237.0000 mL | Freq: Two times a day (BID) | ORAL | Status: DC

## 2020-06-02 NOTE — Progress Notes (Signed)
Occupational Therapy Treatment Patient Details Name: Gary Gonzalez MRN: [id number] DOB: [birth year] Today's Date: 06/02/2020    History of present illness 87 yo male admitted with C-diff, weakness. Hx of recent COVID, LBBB, CAD, CABG, PVC, NSVT, gout, fibromyalgia, sleep apnea   OT comments  Treatment focused on safe ADLs and activity tolerance. Patient able to ambulate in room with RW and perform toileting with setup and min guard. Patient initially reporting feeling light headed - vital signs WFL. Patient reports feeling unsteady with ambulation but no overt loss of balance. Patient progressing well. Discussed use of shower chair at discharge and having assistance from daughters.    Follow Up Recommendations  Home health OT;Supervision/Assistance - 24 hour    Equipment Recommendations  None recommended by OT    Recommendations for Other Services      Precautions / Restrictions Precautions Precautions: Fall Precaution Comments: bowel incontinence Restrictions Weight Bearing Restrictions: No       Mobility Bed Mobility   Bed Mobility: Sit to Supine Rolling: Supervision         General bed mobility comments: returned to bed with supervision.  Transfers Overall transfer level: Needs assistance Equipment used: Rolling walker (2 wheeled) Transfers: Sit to/from Stand Sit to Stand: Min guard         General transfer comment: Min guard for ambulation in room with RW    Balance Overall balance assessment: Mild deficits observed, not formally tested                                         ADL either performed or assessed with clinical judgement   ADL Overall ADL's : Needs assistance/impaired                         Toilet Transfer: Min guard;Regular Toilet;RW;Grab bars Armed forces technical officer Details (indicate cue type and reason): Patient ambulated to bathroom with RW and min guard from therapist. Min guard for sitting and standing from standard  toilet. Toileting- Water quality scientist and Hygiene: Sit to/from stand Toileting - Water quality scientist Details (indicate cue type and reason): Patient able to perform toileting after BM with set up from therapist (supplying warm/soapy wash cloths)             Vision Patient Visual Report: No change from baseline     Perception     Praxis      Cognition Arousal/Alertness: Awake/alert Behavior During Therapy: WFL for tasks assessed/performed Overall Cognitive Status: Within Functional Limits for tasks assessed                                          Exercises     Shoulder Instructions       General Comments      Pertinent Vitals/ Pain       Pain Assessment: No/denies pain  Home Living                                          Prior Functioning/Environment              Frequency  Min 2X/week        Progress Toward Goals  OT  Goals(current goals can now be found in the care plan section)  Progress towards OT goals: Progressing toward goals  Acute Rehab OT Goals Patient Stated Goal: to get stronger, likes reading, golf, tennis OT Goal Formulation: With patient Time For Goal Achievement: 06/13/20 Potential to Achieve Goals: Good  Plan Discharge plan remains appropriate    Co-evaluation                 AM-PAC OT "6 Clicks" Daily Activity     Outcome Measure   Help from another person eating meals?: None Help from another person taking care of personal grooming?: A Little Help from another person toileting, which includes using toliet, bedpan, or urinal?: A Little Help from another person bathing (including washing, rinsing, drying)?: A Little Help from another person to put on and taking off regular upper body clothing?: A Little Help from another person to put on and taking off regular lower body clothing?: A Little 6 Click Score: 19    End of Session Equipment Utilized During Treatment: Gait  belt;Rolling walker  OT Visit Diagnosis: Unsteadiness on feet (R26.81);Other abnormalities of gait and mobility (R26.89);Muscle weakness (generalized) (M62.81)   Activity Tolerance Patient tolerated treatment well   Patient Left in bed;with call bell/phone within reach;with bed alarm set   Nurse Communication Mobility status        Time: 6314-9702 OT Time Calculation (min): 19 min  Charges: OT General Charges $OT Visit: 1 Visit OT Treatments $Self Care/Home Management : 8-22 mins  Derl Barrow, OTR/L [place]  Office [phone] Pager: [place] 06/02/2020, 2:04 PM

## 2020-06-02 NOTE — Progress Notes (Signed)
PROGRESS NOTE  [name] ZWC:[id number] DOB: June 12, 1926 DOA: 05/26/2020 PCP: [name], MD  HPI/Recap of past 24 hours: Gary Gonzalez a 87 y.o.malewithhistory of CAD status post CABG, PVC induced cardiomyopathy last EF in 2018 was normalized with grade 1 diastolic dysfunction follows with Dr. [name] cardiologist, who wasrecently admitted for cholecystectomy following which patient also was readmitted again for diverticulitisand covid. He wasdischarged home on May 16, 2020 and was taking oral antibiotics until 4 days ago. After completion of patient antibiotic course,patient started having fever,chills and gotweak.He tested positive for C Diff on admission, started on PO vanco.   06/02/20: No new complaints this morning.  Advised to increase his oral fluid intake.  He is receptive.  Assessment/Plan: Principal Problem:   C. difficile diarrhea Active Problems:   CAD (coronary artery disease)   PVC's (premature ventricular contractions)   S/P CABG x 2   CKD (chronic kidney disease), stage III (HCC)   Chronic combined systolic and diastolic CHF (congestive heart failure) (HCC)   Hypothyroidism (acquired)   Generalized weakness  C. Difficile, POA. -Tested positive for C. difficile on admission 05/27/20. Oral vancomycin started 1/28  -CT abdomen pelvis revealed diverticulosis of the sigmoid colon with diffuse wall thickening, cannot exclude early diverticulitis and was empirically started on Zosyn, I will discontinueantibiotics for nowand monitor on oral vancomycin Continue oral vancomycin Continue to monitor stool output -Leukocytosis resolved, afebrile.  Mild nonoliguric AKI suspect prerenal in the setting of mild dehydration Baseline creatinine 1.0 with GFR greater than 60 Creatinine today 1.3 with GFR 51 Advised to increase his oral fluid intake, receptive We will start gentle IV fluid hydration and repeat BMP in the morning. Monitor urine output.  CAD status  post CABG -Continue aspirin, Zetia, Imdur Denies any anginal symptoms at the time of this visit.  Elevated troponin secondary to demand ischemia in the setting of acute infection Denies any anginal symptoms. Continue to monitor on telemetry.  History of PVC induced cardiomyopathy -Continue to monitor on telemetry  Hypothyroidism TSH 0.536 -Continue home Synthroid  Recent Covid infection -Tested positive1/15 -Completed 3 days of Remdesivir -Patient has no respiratory complaints, he is out of the isolation window -CT chest:Emphysematous changes and fibrosis in the lungs. Atelectasis in the lung bases. Mild peripheral bronchiectasis in the bases. No focal consolidation.  Elevated d-dimer -Had trended down after COVID but now trended up. He has had 3 hospitalizations, with recent surgery. CTA chest and venous duplex neg for VTE.  D-dimer>20 Repeat D-dimer in the morning   In agreement with assessment of the pressure ulcer as below:  Pressure Injury 05/27/20 Sacrum Medial;Lower  blanchable; moisture associated (Active)  05/27/20 2300  Location: Sacrum  Location Orientation: Medial;Lower  Staging:   Wound Description (Comments): blanchable; moisture associated  Present on Admission:      Nutrition Problem: Inadequate oral intake Etiology: decreased appetite   DVT prophylaxis:  enoxaparin (LOVENOX) injection 40 mg Start: 05/27/20 1000  Code Status: Full Family Communication:  None at bedside.   Status is: Inpatient    Dispo: The patient is from: Home.              Anticipated d/c is to: Home.               Anticipated d/c date is: 06/03/2020.              Patient currently Not stable for discharge due to ongoing management of C. Difficile infection.   difficult to place patient  Objective: Vitals:   06/01/20 1234 06/01/20 2135 06/02/20 0609 06/02/20 1247  BP: (!) 141/68 (!) 112/54 (!) 117/54 (!) 139/57  Pulse: 69 77 70 86  Resp: 20  18  18   Temp: 97.6 F (36.4 C) 97.7 F (36.5 C) 98.1 F (36.7 C) 97.6 F (36.4 C)  TempSrc: Oral Oral  Oral  SpO2: 99% 97% 99% 100%  Weight:      Height:        Intake/Output Summary (Last 24 hours) at 06/02/2020 1902 Last data filed at 06/02/2020 0534 Gross per 24 hour  Intake 180 ml  Output 550 ml  Net -370 ml   Filed Weights   05/26/20 1606  Weight: 71 kg    Exam:  . General: 87 y.o. year-old male pleasant well-developed well-nourished in no acute distress.  Alert and wanted x3. . Cardiovascular: Regular rate and rhythm no rubs or gallops.   Marland Kitchen Respiratory: Clear to auscultation no wheezes or rales. . Abdomen: Soft nontender bowel sounds present. . Musculoskeletal: No lower extremity edema bilaterally.   . Skin: No ulcerative lesions noted.   Marland Kitchen Psychiatry: Mood is appropriate for condition and setting.  Data Reviewed: CBC: Recent Labs  Lab 05/27/20 0834 05/28/20 0545 05/29/20 6270 05/30/20 0512 05/31/20 0537 06/01/20 0613  WBC 25.9* 16.9* 13.9* 10.1 8.5 9.7  NEUTROABS 23.3* 14.8* 11.4* 7.8* 5.6  --   HGB 9.5* 7.9* 7.8* 8.6* 8.9* 9.3*  HCT 29.2* 25.0* 24.9* 27.2* 28.0* 29.1*  MCV 92.1 93.6 94.7 92.8 92.1 91.5  PLT 342 307 285 332 375 350*   Basic Metabolic Panel: Recent Labs  Lab 05/30/20 0512 05/31/20 0537 06/01/20 0613 06/02/20 0634 06/02/20 1630  NA 141 142 141 141 140  K 3.2* 3.7 4.0 3.7 3.9  CL 112* 114* 111 110 106  CO2 20* 20* 22 25 24   GLUCOSE 87 93 93 95 148*  BUN 19 15 13 14 14   CREATININE 1.00 1.06 1.17 1.27* 1.32*  CALCIUM 7.7* 7.7* 7.7* 7.9* 8.1*  MG 1.9 1.9  --   --   --    GFR: Estimated Creatinine Clearance: 36.6 mL/min (A) (by C-G formula based on SCr of 1.32 mg/dL (H)). Liver Function Tests: Recent Labs  Lab 05/27/20 0834 05/28/20 0545 05/29/20 0638 05/30/20 0512 05/31/20 0537  AST 23 15 14* 15 18  ALT 18 15 13 12 14   ALKPHOS 91 76 75 78 71  BILITOT 0.9 0.6 0.4 0.4 0.5  PROT 5.9* 5.1* 5.0* 4.9* 4.9*  ALBUMIN 2.3* 2.2* 2.1*  1.9* 2.0*   No results for input(s): LIPASE, AMYLASE in the last 168 hours. No results for input(s): AMMONIA in the last 168 hours. Coagulation Profile: No results for input(s): INR, PROTIME in the last 168 hours. Cardiac Enzymes: No results for input(s): CKTOTAL, CKMB, CKMBINDEX, TROPONINI in the last 168 hours. BNP (last 3 results) No results for input(s): PROBNP in the last 8760 hours. HbA1C: No results for input(s): HGBA1C in the last 72 hours. CBG: No results for input(s): GLUCAP in the last 168 hours. Lipid Profile: No results for input(s): CHOL, HDL, LDLCALC, TRIG, CHOLHDL, LDLDIRECT in the last 72 hours. Thyroid Function Tests: No results for input(s): TSH, T4TOTAL, FREET4, T3FREE, THYROIDAB in the last 72 hours. Anemia Panel: No results for input(s): VITAMINB12, FOLATE, FERRITIN, TIBC, IRON, RETICCTPCT in the last 72 hours. Urine analysis:    Component Value Date/Time   COLORURINE YELLOW 05/26/2020 0036   APPEARANCEUR CLEAR 05/26/2020 0036   LABSPEC 1.028 05/26/2020 0036  PHURINE 6.0 05/26/2020 0036   GLUCOSEU NEGATIVE 05/26/2020 0036   HGBUR NEGATIVE 05/26/2020 0036   BILIRUBINUR NEGATIVE 05/26/2020 0036   KETONESUR NEGATIVE 05/26/2020 0036   PROTEINUR NEGATIVE 05/26/2020 0036   NITRITE NEGATIVE 05/26/2020 0036   LEUKOCYTESUR NEGATIVE 05/26/2020 0036   Sepsis Labs: @LABRCNTIP (procalcitonin:4,lacticidven:4)  ) Recent Results (from the past 240 hour(s))  Culture, blood (routine x 2)     Status: None   Collection Time: 05/26/20 12:10 AM   Specimen: BLOOD RIGHT FOREARM  Result Value Ref Range Status   Specimen Description   Final    BLOOD RIGHT FOREARM Performed at [facility], [place] [address] [place], [place] [address]    Special Requests   Final    BOTTLES DRAWN AEROBIC AND ANAEROBIC Blood Culture results may not be optimal due to an inadequate volume of blood received in culture bottles Performed at [facility] [address], [place], [place] [address]    Culture   Final    NO GROWTH 5 DAYS Performed at [facility], [place] [address] [place], [place] [address]    Report Status 06/01/2020 FINAL  Final  C Difficile Quick Screen w PCR reflex     Status: Abnormal   Collection Time: 05/27/20  9:06 AM   Specimen: STOOL  Result Value Ref Range Status   C Diff antigen POSITIVE (A) NEGATIVE Final   C Diff toxin POSITIVE (A) NEGATIVE Final   C Diff interpretation Toxin producing C. difficile detected.  Final    Comment: CRITICAL RESULT CALLED TO, READ BACK BY AND VERIFIED WITH: BANNO,A @ 1009 ON [id number] BY POTEAT,S Performed at [facility] [address], [place], [place] [address]   Gastrointestinal Panel by PCR , Stool     Status: None   Collection Time: 05/27/20  9:06 AM   Specimen: STOOL  Result Value Ref Range Status   Campylobacter species NOT DETECTED NOT DETECTED Final   Plesimonas shigelloides NOT DETECTED NOT DETECTED Final   Salmonella species NOT DETECTED NOT DETECTED Final   Yersinia enterocolitica NOT DETECTED NOT DETECTED Final   Vibrio species NOT DETECTED NOT DETECTED Final   Vibrio cholerae NOT DETECTED NOT DETECTED Final   Enteroaggregative E coli (EAEC) NOT DETECTED NOT DETECTED Final   Enteropathogenic E coli (EPEC) NOT DETECTED NOT DETECTED Final   Enterotoxigenic E coli (ETEC) NOT DETECTED NOT DETECTED Final   Shiga like toxin producing E coli (STEC) NOT DETECTED NOT DETECTED Final   Shigella/Enteroinvasive E coli (EIEC) NOT DETECTED NOT DETECTED Final   Cryptosporidium NOT DETECTED NOT DETECTED Final   Cyclospora cayetanensis NOT DETECTED NOT DETECTED Final   Entamoeba histolytica NOT DETECTED NOT DETECTED Final   Giardia lamblia NOT DETECTED NOT DETECTED Final   Adenovirus F40/41 NOT DETECTED NOT DETECTED Final   Astrovirus NOT DETECTED NOT DETECTED Final   Norovirus GI/GII NOT DETECTED NOT DETECTED Final   Rotavirus A NOT DETECTED NOT DETECTED  Final   Sapovirus (I, II, IV, and V) NOT DETECTED NOT DETECTED Final    Comment: Performed at [facility], [address] [place], [place] [address]      Studies: No results found.  Scheduled Meds: . acidophilus  1 capsule Oral TID  . aspirin EC  81 mg Oral Daily  . enoxaparin (LOVENOX) injection  40 mg Subcutaneous Q24H  . ezetimibe  10 mg Oral Daily  . famotidine  20 mg Oral Daily  . ferrous sulfate  325 mg Oral Daily  . isosorbide mononitrate  30 mg Oral Daily  . levothyroxine  125 mcg Oral Q0600  . multivitamin with minerals  1 tablet Oral Daily  . vancomycin  125 mg Oral QID    Continuous Infusions:   LOS: 6 days     Kayleen Memos, MD [facility] Pager [phone]  If 7PM-7AM, please contact night-coverage [email or web address] Password [email or web address] 06/02/2020, 7:02 PM

## 2020-06-02 NOTE — TOC Progression Note (Addendum)
Transition of Care [facility]) - Progression Note    Patient Details  Name: Gary Gonzalez MRN: [id number] Date of Birth: May 29, 1926  [facility] CM/SW Contact  Ross Ludwig, [place] Phone Number: 06/02/2020, 11:55 AM  Clinical Narrative:     CSW spoke to patient's daughter Rosary Lively [phone], and discussed home health recommendations.  CSW asked if she had a preference for home health agencies.  CSW contacted [facility] and they can accept patient once he is medically ready for discharge.  2:05pm   CSW spoke to patient's daughter Tish regarding home health services.  Patient's daughter Rosary Lively stated that she would like the home health agency to contact her sister Juliann Pulse [phone] to schedule the first visit.  CSW updated [place] at [facility].  CSW also confirmed with daughter that patient does not need any extra equipment for the home.  CSW continuing to follow patient's progress throughout discharge planning.       Expected Discharge Plan and Services  Plan to return back home with home health.                                               Social Determinants of Health (SDOH) Interventions    Readmission Risk Interventions No flowsheet data found.

## 2020-06-03 ENCOUNTER — Other Ambulatory Visit (HOSPITAL_COMMUNITY): Payer: Self-pay | Admitting: Internal Medicine

## 2020-06-03 LAB — BASIC METABOLIC PANEL
Anion gap: 10 (ref 5–15)
BUN: 12 mg/dL (ref 8–23)
CO2: 24 mmol/L (ref 22–32)
Calcium: 8.1 mg/dL — ABNORMAL LOW (ref 8.9–10.3)
Chloride: 109 mmol/L (ref 98–111)
Creatinine, Ser: 1.18 mg/dL (ref 0.61–1.24)
GFR, Estimated: 58 mL/min — ABNORMAL LOW (ref >60)
Glucose, Bld: 91 mg/dL (ref 70–99)
Potassium: 3.5 mmol/L (ref 3.5–5.1)
Sodium: 143 mmol/L (ref 135–145)

## 2020-06-03 MED ORDER — RISAQUAD PO CAPS: 1.0000 | ORAL_CAPSULE | Freq: Every day | ORAL | 0 refills | Status: DC

## 2020-06-03 MED ORDER — VANCOMYCIN HCL 125 MG PO CAPS: 125.0000 mg | ORAL_CAPSULE | Freq: Four times a day (QID) | ORAL | 0 refills | Status: DC

## 2020-06-03 MED ORDER — ADULT MULTIVITAMIN W/MINERALS CH
1.0000 | ORAL_TABLET | Freq: Every day | ORAL | 0 refills | Status: AC
Start: 2020-06-04 — End: 2020-09-02

## 2020-06-03 MED FILL — RISAQUAD CAPSULES: 90 days supply | Qty: 90 | Fill #0

## 2020-06-03 NOTE — Progress Notes (Signed)
Went over discharge information with patient and daughter.  All questions answered.  HH and discharge needs set up.  Pt wheeled out via NT,

## 2020-06-03 NOTE — Discharge Instructions (Signed)
10 Things You Can Do to Manage Your COVID-19 Symptoms at Home If you have possible or confirmed COVID-19: 1. Stay home except to get medical care. 2. Monitor your symptoms carefully. If your symptoms get worse, call your healthcare provider immediately. 3. Get rest and stay hydrated. 4. If you have a medical appointment, call the healthcare provider ahead of time and tell them that you have or may have COVID-19. 5. For medical emergencies, call 911 and notify the dispatch personnel that you have or may have COVID-19. 6. Cover your cough and sneezes with a tissue or use the inside of your elbow. 7. Wash your hands often with soap and water for at least 20 seconds or clean your hands with an alcohol-based hand sanitizer that contains at least 60% alcohol. 8. As much as possible, stay in a specific room and away from other people in your home. Also, you should use a separate bathroom, if available. If you need to be around other people in or outside of the home, wear a mask. 9. Avoid sharing personal items with other people in your household, like dishes, towels, and bedding. 10. Clean all surfaces that are touched often, like counters, tabletops, and doorknobs. Use household cleaning sprays or wipes according to the label instructions. cdc.gov/coronavirus 11/13/2019 This information is not intended to replace advice given to you by your health care provider. Make sure you discuss any questions you have with your health care provider. Document Revised: 02/29/2020 Document Reviewed: 02/29/2020 Elsevier Patient Education  2021 Elsevier Inc.  COVID-19 Quarantine vs. Isolation QUARANTINE keeps someone who was in close contact with someone who has COVID-19 away from others. Quarantine if you have been in close contact with someone who has COVID-19, unless you have been fully vaccinated. If you are fully vaccinated  You do NOT need to quarantine unless they have symptoms  Get tested 3-5 days after  your exposure, even if you don't have symptoms  Wear a mask indoors in public for 14 days following exposure or until your test result is negative If you are not fully vaccinated  Stay home for 14 days after your last contact with a person who has COVID-19  Watch for fever (100.4F), cough, shortness of breath, or other symptoms of COVID-19  If possible, stay away from people you live with, especially people who are at higher risk for getting very sick from COVID-19  Contact your local public health department for options in your area to possibly shorten your quarantine ISOLATION keeps someone who is sick or tested positive for COVID-19 without symptoms away from others, even in their own home. People who are in isolation should stay home and stay in a specific "sick room" or area and use a separate bathroom (if available). If you are sick and think or know you have COVID-19 Stay home until after  At least 10 days since symptoms first appeared and  At least 24 hours with no fever without the use of fever-reducing medications and  Symptoms have improved If you tested positive for COVID-19 but do not have symptoms  Stay home until after 10 days have passed since your positive viral test  If you develop symptoms after testing positive, follow the steps above for those who are sick cdc.gov/coronavirus 01/25/2020 This information is not intended to replace advice given to you by your health care provider. Make sure you discuss any questions you have with your health care provider. Document Revised: 02/29/2020 Document Reviewed: 02/29/2020 Elsevier Patient Education    [place] Difficile Infection Clostridioides difficile infection, or C. diff, is an infection that is caused by C. diff germs (bacteria). This infection may happen after you take antibiotics that kill other germs and let C. diff germs grow. C. diff can be spread from person to person (is  contagious). What are the causes?  Taking certain antibiotics.  Coming in contact with people, food, or things that have C. diff. What increases the risk?  Taking certain antibiotics for a long time.  Staying in a hospital or long-term care facility for a long time.  Being age 85 or older.  Having had C. diff before or been exposed to C. diff.  Having a weak disease-fighting system (immune system).  Taking medicines that treat stomach acid.  Having serious health problems, including: ? Colon cancer. ? Inflammatory bowel disease (IBD).  Having had a procedure or surgery on your digestive system. What are the signs or symptoms?  Watery poop (diarrhea).  Fever.  Not feeling hungry.  Feeling like you may vomit.  Swelling, pain, cramps, or a tender belly. How is this treated? Treatment may include:  Stopping the antibiotics that caused the C. diff infection.  Taking antibiotics that kill C. diff.  Placing poop from a healthy person into your colon (fecal transplant).  Doing surgery to take out the infected part of the colon. Follow these instructions at home: Medicines  Take over-the-counter and prescription medicines only as told by your doctor.  Take antibiotic medicine as told by your doctor. Do not stop taking it even if you start to feel better.  Do not take medicines to treat watery poop unless your doctor tells you to. Eating and drinking  Follow instructions from your doctor about what to eat and drink. This may include eating bland foods in small amounts, such as: ? Bananas. ? Applesauce. ? Rice. ? Lean meats. ? Toast. ? Crackers.  To prevent loss of fluid in your body (dehydration): ? Take in enough fluids to keep your pee pale yellow. This includes water, ice chips, clear fruit juice with water added to it, or low-calorie sports drinks. ? Take an ORS (oral rehydration solution). This drink is sold in pharmacies and retail stores.  Avoid milk,  caffeine, and alcohol.   General instructions  Wash your hands often with soap and water. Do this for at least 20 seconds.  Take a bath or shower every day.  Return to your normal activities when your doctor says that it is safe.  Keep all follow-up visits. How is this prevented? Personal hygiene  Wash your hands often with soap and water. Do this for at least 20 seconds.  Wash your hands before you cook and after you use the bathroom.  Other people should wash their hands too, especially: ? People who live with you. ? People who visit you in a hospital or clinic.   Contact precautions  If you get watery poop while you are in the hospital or a long-term care facility, tell your doctor right away.  When you visit someone in the hospital or a long-term care facility, wear a gown, gloves, or other protection.  If possible: ? Stay away from people who have diarrhea. ? Use a separate bathroom if you are sick and live with other people. Clean environment  Keep your home clean. ? Clean your home every day for at least a week after you leave the hospital.  Clean surfaces that you touch every day. Use  a product that has a 10% chlorine bleach solution. Be sure to: ? Read the label on your product to make sure that the product will kill the germs on your surfaces. ? Clean toilets and flush handles, bathtubs, sinks, doorknobs and handles, countertops, and work surfaces.  If you are in the hospital, make sure the surfaces in your room are cleaned each day. Tell someone right away if body fluids have splashed or spilled. Clothes and linens  Wash clothes and linens using laundry soap that has chlorine bleach. Be sure to: ? Use powder soap instead of liquid. ? Clean your washing machine once a month. To do this, turn on the hot setting with only soap in it. Contact a doctor if:  Your symptoms do not get better or they get worse.  Your symptoms go away and then come back.  You have a  fever.  You have new symptoms. Get help right away if:  Your belly is more tender or you have more pain.  Your poop is mostly bloody.  Your poop looks black.  You vomit after you eat or drink.  You have signs of not having enough fluids in your body. These include: ? Dark yellow pee, very little pee, or no pee. ? Cracked lips or dry mouth. ? No tears when you cry. ? Sunken eyes. ? Feeling sleepy. ? Feeling weak or dizzy. Summary  C. diff infection is an infection that may happen after you take antibiotic medicines.  Symptoms include watery poop, fever, not feeling hungry, or feeling like you may vomit.  Treatment includes stopping the antibiotics that made you sick and taking antibiotics that kill the C. diff germs. Poop from a healthy person may also be placed into your colon.  To prevent C. diff infectionfrom spreading, wash hands often with soap and water. Do this for at least 20 seconds. Keep your home clean. This information is not intended to replace advice given to you by your health care provider. Make sure you discuss any questions you have with your health care provider. Document Revised: 08/06/2019 Document Reviewed: 08/06/2019 Elsevier Patient Education  [place].

## 2020-06-03 NOTE — TOC Benefit Eligibility Note (Signed)
Transition of Care [facility]) Benefit Eligibility Note    Patient Details  Name: Gary Gonzalez MRN: [id number] Date of Birth: [birth year]   Medication/Dose: Vancomycin 125mg . 4X aday for 7 days  Covered?: Yes  Tier:  (4)  Prescription Coverage Preferred [facility]acy: [facility]  Spoke with Person/Company/Phone Number:: [place] [phone]  Co-Pay: $100.00  Prior Approval: No  Deductible:  (no deductible)       Shelda Altes Phone Number: 06/03/2020, 12:19 PM

## 2020-06-03 NOTE — Progress Notes (Signed)
Physical Therapy Treatment Patient Details Name: Gary Gonzalez MRN: [id number] DOB: [birth year] Today's Date: 06/03/2020    History of Present Illness 87 yo male admitted with C-diff, weakness. Hx of recent COVID, LBBB, CAD, CABG, PVC, NSVT, gout, fibromyalgia, sleep apnea    PT Comments    Pt reports feeling well and ambulated good distance in hallway.  Pt anticipates d/c home today.    Follow Up Recommendations  Home health PT;Supervision for mobility/OOB     Equipment Recommendations  None recommended by PT    Recommendations for Other Services       Precautions / Restrictions Precautions Precautions: Fall Precaution Comments: bowel incontinence Restrictions Weight Bearing Restrictions: No    Mobility  Bed Mobility               General bed mobility comments: pt in recliner on arrival  Transfers Overall transfer level: Needs assistance Equipment used: Rolling walker (2 wheeled) Transfers: Sit to/from Stand Sit to Stand: Min guard         General transfer comment: pt recalls good hand placement  Ambulation/Gait Ambulation/Gait assistance: Min guard Gait Distance (Feet): 200 Feet Assistive device: Rolling walker (2 wheeled) Gait Pattern/deviations: Step-through pattern;Decreased stride length     General Gait Details: cues for posture, steady with RW, distance to tolerance   Stairs             Wheelchair Mobility    Modified Rankin (Stroke Patients Only)       Balance                                            Cognition Arousal/Alertness: Awake/alert Behavior During Therapy: WFL for tasks assessed/performed Overall Cognitive Status: Within Functional Limits for tasks assessed                                        Exercises      General Comments        Pertinent Vitals/Pain Pain Assessment: No/denies pain    Home Living                      Prior Function            PT  Goals (current goals can now be found in the care plan section) Progress towards PT goals: Progressing toward goals    Frequency    Min 3X/week      PT Plan Current plan remains appropriate    Co-evaluation              AM-PAC PT "6 Clicks" Mobility   Outcome Measure  Help needed turning from your back to your side while in a flat bed without using bedrails?: A Little Help needed moving from lying on your back to sitting on the side of a flat bed without using bedrails?: A Little Help needed moving to and from a bed to a chair (including a wheelchair)?: A Little Help needed standing up from a chair using your arms (e.g., wheelchair or bedside chair)?: A Little Help needed to walk in hospital room?: A Little Help needed climbing 3-5 steps with a railing? : A Little 6 Click Score: 18    End of Session   Activity Tolerance: Patient tolerated treatment well Patient left:  with call bell/phone within reach;with family/visitor present;in chair Nurse Communication: Mobility status PT Visit Diagnosis: Muscle weakness (generalized) (M62.81);Difficulty in walking, not elsewhere classified (R26.2)     Time: 1039-1050 PT Time Calculation (min) (ACUTE ONLY): 11 min  Charges:  $Gait Training: 8-22 mins                     Jannette Spanner PT, DPT Acute Rehabilitation Services Pager: [phone] Office: [phone]  York Ram E 06/03/2020, 12:51 PM

## 2020-06-03 NOTE — TOC Transition Note (Signed)
Transition of Care [facility]) - CM/SW Discharge Note   Patient Details  Name: Gary Gonzalez MRN: [id number] Date of Birth: 01/03/1927  Transition of [facility] CM/SW Contact:  Ross Ludwig, LCSW Phone Number: 06/03/2020, 3:33 PM   Clinical Narrative:     CSW was informed that patient will be ready for discharge today.  CSW was informed by attending physician that patient will have to go home on 7 days of Vancomycin, and physician asked if CSW can find out the cost.  CSW requested a benefits check for medication, per benefit check it will be $100 at patient's regular pharmacy.  Physician contacted [facility], cost would be $100 there too.  CSW checked on [place], CSW was able to find a coupon for patient's medication for $57.22.  CSW contacted [facility] [address] which is his primary pharmacy, they informed CSW that the medication is out of stock, however the [employer] at [facility] does have the medication in stock per pharmacy.  CSW contacted [place] at [place], they can accept [facility] coupon, pharmacy requested the prescription to be faxed to [phone].  CSW faxed prescription to pharmacy.  Pharmacy confirmed receipt of prescription, CSW notified patient's daughter Rosary Lively [phone] and informed her about medication pickup.  Patient's daughter expressed gratitude for CSW help with finding the best price for the patient.  CSW arranged for patient to receive home health services through [facility]. Patient will be receiving HH PT, OT, RN, aide, and Education officer, museum.  CSW notified [place] at [facility] that patient is discharging today.  CSW signing off for now.   Final next level of care: [place] Barriers to Discharge: Barriers Resolved   Patient Goals and CMS Choice Patient states their goals for this hospitalization and ongoing recovery are:: To return back home with home health services. CMS Medicare.gov Compare Post  Acute Care list provided to:: Patient Represenative (must comment) Choice offered to / list presented to : Adult Children  Discharge Placement                       Discharge Plan and Services                          HH Arranged: PT,OT,Nurse's [place] Agency: [facility] Date [place] Agency Contacted: 06/03/20 Time [place]: 1200 Representative spoke with at [place]: [place]  Social Determinants of Health ([place]) Interventions     Readmission Risk Interventions No flowsheet data found.

## 2020-06-03 NOTE — Discharge Summary (Signed)
Discharge Summary  Gary Gonzalez [id number] DOB: 29-Jul-1926  PCP: Gary Infante, MD  Admit date: 05/26/2020 Discharge date: 06/03/2020  Time spent: 35 minutes.  Recommendations for Outpatient Follow-up:  Follow-up with your primary care provider in 1 to 2 weeks Follow-up with your cardiologist in 1 to 2 weeks Take your medications as prescribed Continue PT OT with assistance and fall precautions. 1.  Dehydration.  Discharge Diagnoses:  Active Hospital Problems   Diagnosis Date Noted  . C. difficile diarrhea 05/27/2020  . Generalized weakness 05/26/2020  . Hypothyroidism (acquired) 10/29/2016  . Chronic combined systolic and diastolic CHF (congestive heart failure) ([name])   . S/P CABG x 2   . CKD (chronic kidney disease), stage III ([place])   . CAD (coronary artery disease) 03/30/2011  . PVC's (premature ventricular contractions) 03/30/2011    Resolved Hospital Problems  No resolved problems to display.    Discharge Condition: Stable.  Diet recommendation: Resume previous diet.  Vitals:   06/02/20 2154 06/03/20 0552  BP: (!) 106/50 99/72  Pulse: 76 66  Resp: 20 20  Temp: 97.6 F (36.4 C) 98.1 F (36.7 C)  SpO2: 99% 98%    History of present illness:  Gary Abila Ryanis a 87 y.o.malewithhistory of CAD status post CABG, PVC induced cardiomyopathy last EF in 2018 was normalized with grade 1 diastolic dysfunction follows with Dr. [name] cardiologist, who wasrecently admitted for cholecystectomy following which patient also was readmitted again for diverticulitisand covid. He wasdischarged home on May 16, 2020 and was taking oral antibiotics until 4 days ago. After completion of patient antibiotic course,patient started having fever,chills and gotweak.He tested positive for C Diff on admission, started on PO vanco.  06/03/20:  Patient was seen with his daughter at his bedside.  No acute events overnight.  He is tolerating a diet well.  He is eager to go  home.  Hospital Course:  Principal Problem:   C. difficile diarrhea Active Problems:   CAD (coronary artery disease)   PVC's (premature ventricular contractions)   S/P CABG x 2   CKD (chronic kidney disease), stage III ([facility])   Chronic combined systolic and diastolic CHF (congestive heart failure) ([facility])   Hypothyroidism (acquired)   Generalized weakness  C. Difficile diarrhea, POA. -Tested positive for C. difficile on admission 05/27/20. Oral vancomycin started 05/27/20 LDL 06/03/2020 -Continue oral vancomycin, prescribed Vancocin capsule 125 mg 4 times daily x7 days -Continue probiotics daily x90 days. -Follow-up with your PCP in 1 to 2 weeks.  Resolved with IV fluid hydration: Mild nonoliguric AKI, prerenal in the setting of mild dehydration Baseline creatinine 1.0 with GFR greater than 60 Creatinine today 1.1 with GFR 58 from 1.3 with GFR 51 Received gentle IV fluid hydration Continue to avoid dehydration. Has had good urine output, 1.0 L recorded  CAD status post CABG -Continue aspirin, Zetia, Imdur Denies any anginal symptoms at the time of this visit. Follow-up with your cardiologist.  Elevated troponin secondary to demand ischemia in the setting of acute infection Troponin has peaked at 25, remained flat Nonspecific ST-T changes on twelve-lead EKG. Denies any anginal symptoms. Follow-up with your cardiologist.  History of PVC induced cardiomyopathy Cardiology outpatient follow-up.  Hypothyroidism TSH 0.536 -Continue home Synthroid PCP outpatient follow-up.  Recent Covid infection -Tested positive1/15/22 -Completed 3 days of Remdesivir -Patient has no respiratory complaints, he is out of the isolation window -CT chest:Emphysematous changes and fibrosis in the lungs. Atelectasis in the lung bases. Mild peripheral bronchiectasis in the bases. No focal  consolidation. -O2 saturation 98% on room air.  Elevated d-dimer secondary to COVID-19 viral  infection. -Had trended down after COVID but now trended up. He has had 3 hospitalizations, with recent surgery. CTA chest and venous duplex neg for VTE.  Follow-up with your PCP Mobilize as tolerated with fall precautions.  In agreement with assessment of the pressure ulcer as below: Pressure Injury 05/27/20 Sacrum Medial;Lower blanchable; moisture associated (Active)  05/27/20 2300  Location: Sacrum  Location Orientation: Medial;Lower  Staging:   Wound Description (Comments): blanchable; moisture associated  Present on Admission:   Code Status:Full code.   Discharge Exam: BP 99/72 (BP Location: Right Arm)   Pulse 66   Temp 98.1 F (36.7 C) (Oral)   Resp 20   Ht 6' (1.829 m)   Wt 71 kg   SpO2 98%   BMI 21.23 kg/m  . General: 87 y.o. year-old male well developed well nourished in no acute distress.  Alert and oriented x3. . Cardiovascular: Regular rate and rhythm with no rubs or gallops.  No thyromegaly or JVD noted.   Marland Kitchen Respiratory: Clear to auscultation with no wheezes or rales. Good inspiratory effort. . Abdomen: Soft nontender nondistended with normal bowel sounds x4 quadrants. . Musculoskeletal: No lower extremity edema. 2/4 pulses in all 4 extremities. Marland Kitchen Psychiatry: Mood is appropriate for condition and setting  Discharge Instructions You were cared for by a hospitalist during your hospital stay. If you have any questions about your discharge medications or the care you received while you were in the hospital after you are discharged, you can call the unit and asked to speak with the hospitalist on call if the hospitalist that took care of you is not available. Once you are discharged, your primary care physician will handle any further medical issues. Please note that NO REFILLS for any discharge medications will be authorized once you are discharged, as it is imperative that you return to your primary care physician (or establish a relationship with a primary care  physician if you do not have one) for your aftercare needs so that they can reassess your need for medications and monitor your lab values.   Allergies as of 06/03/2020      Reactions   Atenolol Diarrhea, Other (See Comments)   Very weak, fatigued, lethargic, no appetite      Medication List    STOP taking these medications   loperamide 2 MG capsule Commonly known as: IMODIUM     TAKE these medications   acidophilus Caps capsule Take 1 capsule by mouth daily.   aspirin 81 MG tablet Take 81 mg by mouth daily.   ezetimibe 10 MG tablet Commonly known as: ZETIA Take 10 mg by mouth daily.   ferrous sulfate 325 (65 FE) MG tablet Take 1 tablet (325 mg total) by mouth daily.   isosorbide mononitrate 30 MG 24 hr tablet Commonly known as: IMDUR Take 30 mg by mouth daily.   levothyroxine 125 MCG tablet Commonly known as: SYNTHROID Take 1 tablet by mouth daily before breakfast.   mirtazapine 15 MG tablet Commonly known as: REMERON Take 15 mg by mouth at bedtime.   multivitamin with minerals Tabs tablet Take 1 tablet by mouth daily. Start taking on: June 04, 2020   nitroGLYCERIN 0.4 MG SL tablet Commonly known as: NITROSTAT Place 0.4 mg under the tongue every 5 (five) minutes as needed for chest pain. What changed: Another medication with the same name was removed. Continue taking this medication, and follow  the directions you see here.   omeprazole 20 MG capsule Commonly known as: PRILOSEC Take 20 mg by mouth daily.   Repatha SureClick 427 MG/ML Soaj Generic drug: Evolocumab Inject 140 mg into the skin every 28 (twenty-eight) days.   vancomycin 125 MG capsule Commonly known as: Vancocin HCl Take 1 capsule (125 mg total) by mouth 4 (four) times daily for 7 days.      Allergies  Allergen Reactions  . Atenolol Diarrhea and Other (See Comments)    Very weak, fatigued, lethargic, no appetite    Follow-up Information    Gary Infante, MD. Call in 1 day(s).    Specialty: Internal Medicine Why: Please call for a post hospital follow-up appointment. Contact information: [place] [address] [phone]        [name], [name], MD .   Specialty: Cardiology Contact information: [address] [place] [address] [phone]        Evans Lance, MD .   Specialty: Cardiology Contact information: [phone] [place] [address] [phone]                The results of significant diagnostics from this hospitalization (including imaging, microbiology, ancillary and laboratory) are listed below for reference.    Significant Diagnostic Studies: DG Cholangiogram Operative  Result Date: 05/10/2020 CLINICAL DATA:  Cholelithiasis. EXAM: INTRAOPERATIVE CHOLANGIOGRAM TECHNIQUE: Cholangiographic images from the C-arm fluoroscopic device were submitted for interpretation post-operatively. Please see the procedural report for the amount of contrast and the fluoroscopy time utilized. COMPARISON:  None. FINDINGS: Submitted images demonstrate opacification of the biliary tree through cystic duct remnant. There is partial opacification of the pancreatic duct the she sees he is to without significant abnormality. Multiple small filling defects seen within the distal common bile duct most likely related to calculi. There is flow of contrast into the duodenum. IMPRESSION: Intraoperative cholangiogram as above. Electronically Signed   By: Miachel Roux M.D.   On: 05/10/2020 11:07   CT HEAD WO CONTRAST  Result Date: 05/27/2020 CLINICAL DATA:  Altered mental status, generalized weakness, COVID positive EXAM: CT HEAD WITHOUT CONTRAST TECHNIQUE: Contiguous axial images were obtained from the base of the skull through the vertex without intravenous contrast. COMPARISON:  None. FINDINGS: Brain: Normal anatomic configuration. Minimal parenchymal volume loss. Mild periventricular white matter changes are  present likely reflecting the sequela of small vessel ischemia. No abnormal intra or extra-axial mass lesion or fluid collection. No abnormal mass effect or midline shift. No evidence of acute intracranial hemorrhage or infarct. Ventricular size is normal. Cerebellum unremarkable. Vascular: No asymmetric hyperdense vasculature at the skull base. Skull: Intact Sinuses/Orbits: There is mild mucosal thickening within the right maxillary and left sphenoid sinuses. No air-fluid levels. Remaining paranasal sinuses are clear. Orbits are unremarkable. Other: Mastoid air cells and middle ear cavities are clear. IMPRESSION: No acute intracranial hemorrhage or infarct. Mild paranasal sinus disease. Electronically Signed   By: Fidela Salisbury MD   On: 05/27/2020 02:30   CT CHEST WO CONTRAST  Result Date: 05/27/2020 CLINICAL DATA:  Persistent cough and weakness. Recent cholecystectomy. COVID positive 05/14/2020. EXAM: CT CHEST WITHOUT CONTRAST TECHNIQUE: Multidetector CT imaging of the chest was performed following the standard protocol without IV contrast. COMPARISON:  Chest radiograph 05/26/2020 FINDINGS: Cardiovascular: Normal heart size. No pericardial effusions. Postoperative coronary bypass. Normal caliber thoracic aorta. Scattered aortic calcification. Mediastinum/Nodes: Esophagus is decompressed. Calcified mediastinal lymph nodes. Mediastinal lymph nodes are not pathologically enlarged. Lungs/Pleura:  Emphysematous changes in the lungs. Mild peripheral fibrosis. Atelectasis in the lung bases. No pleural effusions. No pneumothorax. Mild peripheral bronchiectasis in the bases. Upper Abdomen: No acute abnormalities demonstrated in the visualized upper abdomen. Pneumobilia is likely postoperative. Musculoskeletal: Sternotomy wires. Mild degenerative changes in the spine. No destructive bone lesions. Surgical clips in the axilla. IMPRESSION: 1. Emphysematous changes and fibrosis in the lungs. Atelectasis in the lung bases.  Mild peripheral bronchiectasis in the bases. No focal consolidation. 2. Postoperative coronary bypass. 3. Pneumobilia is likely postoperative. 4. Emphysema and aortic atherosclerosis. Aortic Atherosclerosis (ICD10-I70.0) and Emphysema (ICD10-J43.9). Electronically Signed   By: Lucienne Capers M.D.   On: 05/27/2020 02:42   CT ANGIO CHEST PE W OR WO CONTRAST  Result Date: 05/28/2020 CLINICAL DATA:  COVID-19, elevated D-dimer, question pulmonary embolism, history CHF, chronic kidney disease, coronary artery disease post CABG, melanoma; post laparoscopic cholecystectomy on 05/10/2020, ERCP with stone extraction on 05/11/2020 EXAM: CT ANGIOGRAPHY CHEST WITH CONTRAST TECHNIQUE: Multidetector CT imaging of the chest was performed using the standard protocol during bolus administration of intravenous contrast. Multiplanar CT image reconstructions and MIPs were obtained to evaluate the vascular anatomy. CONTRAST:  119mL OMNIPAQUE IOHEXOL 350 MG/ML SOLN IV COMPARISON:  CT chest 05/27/2020, CT abdomen and pelvis 05/26/2020 FINDINGS: Cardiovascular: Atherosclerotic calcifications aorta, proximal great vessels and coronary arteries. Postsurgical changes of CABG. Aorta normal caliber without aneurysm or dissection. Slight dilatation of cardiac chambers. No pericardial effusion. Pulmonary arteries adequately opacified and patent. No evidence of pulmonary embolism. Mediastinum/Nodes: Base of cervical region normal appearance. Esophagus unremarkable. No thoracic adenopathy. Lungs/Pleura: Calcified granuloma LEFT upper lobe. Small BILATERAL pleural effusions with compressive atelectasis of adjacent lower lobes. Scarring in anteromedial RIGHT middle lobe unchanged. No acute infiltrate or pneumothorax. Upper Abdomen: Calcified granulomata within liver and spleen. Surgical clips at gallbladder fossa with minimal persistent fluid collection at gallbladder fossa consistent with recent surgery. Minimal pneumobilia. Musculoskeletal:  Prior median sternotomy. No acute osseous findings. Review of the MIP images confirms the above findings. IMPRESSION: No evidence of pulmonary embolism. Small BILATERAL pleural effusions with compressive atelectasis of adjacent lower lobes. Evidence of prior granulomatous disease. Recent cholecystectomy with persistent fluid in gallbladder fossa and pneumobilia from ERCP with sphincterotomy. Aortic Atherosclerosis (ICD10-I70.0). Electronically Signed   By: Lavonia Dana M.D.   On: 05/28/2020 14:40   CT Abdomen Pelvis W Contrast  Result Date: 05/26/2020 CLINICAL DATA:  Acute abdominal pain. Increased white cell count and weakness. Recent cholecystectomy. EXAM: CT ABDOMEN AND PELVIS WITH CONTRAST TECHNIQUE: Multidetector CT imaging of the abdomen and pelvis was performed using the standard protocol following bolus administration of intravenous contrast. CONTRAST:  126mL OMNIPAQUE IOHEXOL 300 MG/ML  SOLN COMPARISON:  05/13/2020 FINDINGS: Lower chest: Peripheral fibrosis and mild honeycomb changes consistent with usual interstitial pneumonitis. Postoperative changes in the mediastinum. Normal heart size. Small esophageal hiatal hernia. Hepatobiliary: Postoperative cholecystectomy with residual fluid in the gallbladder fossa. Amount of fluid is decreased and the gas seen previously is diminished. Decreasing infiltration in the surrounding fat. Pneumobilia consistent with postoperative change. No focal liver lesions. Pancreas: Unremarkable. No pancreatic ductal dilatation or surrounding inflammatory changes. Spleen: Scattered calcified granulomas. Adrenals/Urinary Tract: Adrenal glands are unremarkable. Kidneys are normal, without renal calculi, focal lesion, or hydronephrosis. Bladder is unremarkable. Stomach/Bowel: Stomach, small bowel, and colon are not abnormally distended. Stool fills the colon. Diverticulosis of the sigmoid colon with diffuse wall thickening. No change in appearance since previous study. Likely  muscular hypertrophy but can not exclude early diverticulitis. No abscess.  Appendix is normal. Vascular/Lymphatic: Aortic atherosclerosis. No enlarged abdominal or pelvic lymph nodes. Reproductive: Prostate is unremarkable. Other: No free air or free fluid in the abdomen. Scarring in the periumbilical region is likely postoperative. Abdominal wall musculature appears intact. Musculoskeletal: Degenerative changes in the spine and hips. Mild lumbar scoliosis convex towards the left. No destructive bone lesions. IMPRESSION: 1. Postoperative cholecystectomy with residual fluid in the gallbladder fossa. Amount of fluid is decreased and the gas seen previously is diminished. Decreasing infiltration in the surrounding fat. 2. Diverticulosis of the sigmoid colon with diffuse wall thickening. Likely muscular hypertrophy but can not exclude early diverticulitis. No abscess. No change since prior study. 3. Peripheral fibrosis and mild honeycomb changes in the lungs consistent with usual interstitial pneumonitis. 4. Small esophageal hiatal hernia. 5. Aortic atherosclerosis. Aortic Atherosclerosis (ICD10-I70.0). Electronically Signed   By: Lucienne Capers M.D.   On: 05/26/2020 19:46   CT ABDOMEN PELVIS W CONTRAST  Result Date: 05/13/2020 CLINICAL DATA:  Abdominal pain EXAM: CT ABDOMEN AND PELVIS WITH CONTRAST TECHNIQUE: Multidetector CT imaging of the abdomen and pelvis was performed using the standard protocol following bolus administration of intravenous contrast. CONTRAST:  120mL OMNIPAQUE IOHEXOL 300 MG/ML  SOLN COMPARISON:  None. FINDINGS: LOWER CHEST: Bibasilar atelectasis and small pleural effusions. HEPATOBILIARY: Small amount of perihepatic fluid. Mild pneumobilia. Fluid collection at the gallbladder fossa status post recent cholecystectomy. PANCREAS: Normal pancreas. No ductal dilatation or peripancreatic fluid collection. SPLEEN: Normal. ADRENALS/URINARY TRACT: The adrenal glands are normal. No hydronephrosis,  nephroureterolithiasis or solid renal mass. The urinary bladder is normal for degree of distention STOMACH/BOWEL: There is no hiatal hernia. Normal duodenal course and caliber. No small bowel dilatation or inflammation. There is rectosigmoid diverticulosis with mild wall thickening. Normal appendix. VASCULAR/LYMPHATIC: There is calcific atherosclerosis of the abdominal aorta. No lymphadenopathy. REPRODUCTIVE: Normal prostate size with symmetric seminal vesicles. MUSCULOSKELETAL. Multilevel degenerative disc disease and facet arthrosis. No bony spinal canal stenosis. OTHER: None. IMPRESSION: 1. Status post recent cholecystectomy and sphincterotomy with fluid collection at the gallbladder fossa, and small volume pneumobilia. No specific features of infection. 2. Rectosigmoid diverticulosis with mild wall thickening, which may indicate early acute diverticulitis. 3. Small pleural effusions and bibasilar atelectasis. Aortic Atherosclerosis (ICD10-I70.0). Electronically Signed   By: Ulyses Jarred M.D.   On: 05/13/2020 23:31   MR 3D Recon At Scanner  Result Date: 05/09/2020 CLINICAL DATA:  Abdominal pain cholelithiasis, dilated CBD EXAM: MRI ABDOMEN WITHOUT AND WITH CONTRAST (INCLUDING MRCP) TECHNIQUE: Multiplanar multisequence MR imaging of the abdomen was performed both before and after the administration of intravenous contrast. Heavily T2-weighted images of the biliary and pancreatic ducts were obtained, and three-dimensional MRCP images were rendered by post processing. CONTRAST:  32mL GADAVIST GADOBUTROL 1 MMOL/ML IV SOLN COMPARISON:  Right upper quadrant ultrasound dated 05/08/2020. CT abdomen dated 02/26/2020 FINDINGS: Motion degraded images. Lower chest: Lung bases are clear. Hepatobiliary: No morphologic findings of cirrhosis. No hepatic steatosis. Mildly heterogeneous early arterial perfusion (series 22/image 37) which normalizes on later phases. No suspicious/enhancing hepatic lesions. Layering gallstones  (series 16/image 23), with very mild gallbladder wall thickening/edema (series 3/image 23). No intrahepatic ductal dilatation. Mildly dilated common duct, measuring 10 mm (series 5/image 14), unchanged from prior CT. No choledocholithiasis is seen. Pancreas:  Within normal limits. Spleen:  Within normal limits. Adrenals/Urinary Tract:  Adrenal glands are within normal limits. Tiny bilateral renal cysts measuring up to 5 mm (series 5/image 18 and 22). No hydronephrosis. Stomach/Bowel: Stomach is within normal limits. Visualized bowel is  grossly unremarkable. Vascular/Lymphatic:  No evidence of abdominal aortic aneurysm. No suspicious abdominal lymphadenopathy. Other:  No abdominal ascites. Musculoskeletal: No focal osseous lesions. Mild degenerative changes of the lumbar spine with levoscoliosis. IMPRESSION: Motion degraded images. No intrahepatic ductal dilatation. Mildly dilated common duct, measuring 10 mm, unchanged from prior CT. No choledocholithiasis is seen. In the setting of abnormal LFTs, recent passage a common duct stone is possible. Given the degree of motion degradation, nonvisualized distal CBD stone or stricture is also possible, although these are not favored given the relative stability from prior CT. Cholelithiasis with very mild gallbladder wall thickening/edema. While very early acute cholecystitis is difficult to exclude, this appearance may be secondary to a right upper quadrant inflammatory process such as hepatitis. Consider follow-up right upper quadrant ultrasound or hepatobiliary nuclear medicine scan if symptoms persist. Electronically Signed   By: Julian Hy M.D.   On: 05/09/2020 12:00   DG Chest Port 1 View  Result Date: 05/26/2020 CLINICAL DATA:  Generalized weakness, COVID-19 positive 10 days ago, history of melanoma EXAM: PORTABLE CHEST 1 VIEW COMPARISON:  10/29/2016 FINDINGS: Single frontal view of the chest demonstrates an unremarkable cardiac silhouette. No airspace  disease, effusion, or pneumothorax. Postsurgical changes bilateral axilla. No acute bony abnormalities. IMPRESSION: 1. No acute intrathoracic process. Electronically Signed   By: Randa Ngo M.D.   On: 05/26/2020 17:19   DG ERCP BILIARY & PANCREATIC DUCTS  Result Date: 05/11/2020 CLINICAL DATA:  Choledocholithiasis suspected on intraoperative cholangiogram EXAM: ERCP TECHNIQUE: Multiple spot images obtained with the fluoroscopic device and submitted for interpretation post-procedure. COMPARISON:  05/10/2020 FINDINGS: Series of fluoroscopic spot images document endoscopic cannulation and opacification of the CBD with subsequent balloon catheter passage. There is incomplete opacification of the intrahepatic biliary tree, which appears decompressed centrally. Cholecystectomy clips without extravasation. IMPRESSION: Endoscopic CBD cannulation and intervention as above. These images were submitted for radiologic interpretation only. Please see the procedural report for the amount of contrast and the fluoroscopy time utilized. Electronically Signed   By: Lucrezia Europe M.D.   On: 05/11/2020 13:23   DG Abdomen Acute W/Chest  Result Date: 05/13/2020 CLINICAL DATA:  Abdominal pain EXAM: DG ABDOMEN ACUTE WITH 1 VIEW CHEST COMPARISON:  None. FINDINGS: There is no evidence of dilated bowel loops or free intraperitoneal air. No radiopaque calculi or other significant radiographic abnormality is seen. Heart size and mediastinal contours are within normal limits. Both lungs are clear. IMPRESSION: Negative abdominal radiographs.  No acute cardiopulmonary disease. Electronically Signed   By: Ulyses Jarred M.D.   On: 05/13/2020 21:55   MR ABDOMEN MRCP W WO CONTAST  Result Date: 05/09/2020 CLINICAL DATA:  Abdominal pain cholelithiasis, dilated CBD EXAM: MRI ABDOMEN WITHOUT AND WITH CONTRAST (INCLUDING MRCP) TECHNIQUE: Multiplanar multisequence MR imaging of the abdomen was performed both before and after the administration  of intravenous contrast. Heavily T2-weighted images of the biliary and pancreatic ducts were obtained, and three-dimensional MRCP images were rendered by post processing. CONTRAST:  62mL GADAVIST GADOBUTROL 1 MMOL/ML IV SOLN COMPARISON:  Right upper quadrant ultrasound dated 05/08/2020. CT abdomen dated 02/26/2020 FINDINGS: Motion degraded images. Lower chest: Lung bases are clear. Hepatobiliary: No morphologic findings of cirrhosis. No hepatic steatosis. Mildly heterogeneous early arterial perfusion (series 22/image 37) which normalizes on later phases. No suspicious/enhancing hepatic lesions. Layering gallstones (series 16/image 23), with very mild gallbladder wall thickening/edema (series 3/image 23). No intrahepatic ductal dilatation. Mildly dilated common duct, measuring 10 mm (series 5/image 14), unchanged from prior CT. No choledocholithiasis  is seen. Pancreas:  Within normal limits. Spleen:  Within normal limits. Adrenals/Urinary Tract:  Adrenal glands are within normal limits. Tiny bilateral renal cysts measuring up to 5 mm (series 5/image 18 and 22). No hydronephrosis. Stomach/Bowel: Stomach is within normal limits. Visualized bowel is grossly unremarkable. Vascular/Lymphatic:  No evidence of abdominal aortic aneurysm. No suspicious abdominal lymphadenopathy. Other:  No abdominal ascites. Musculoskeletal: No focal osseous lesions. Mild degenerative changes of the lumbar spine with levoscoliosis. IMPRESSION: Motion degraded images. No intrahepatic ductal dilatation. Mildly dilated common duct, measuring 10 mm, unchanged from prior CT. No choledocholithiasis is seen. In the setting of abnormal LFTs, recent passage a common duct stone is possible. Given the degree of motion degradation, nonvisualized distal CBD stone or stricture is also possible, although these are not favored given the relative stability from prior CT. Cholelithiasis with very mild gallbladder wall thickening/edema. While very early acute  cholecystitis is difficult to exclude, this appearance may be secondary to a right upper quadrant inflammatory process such as hepatitis. Consider follow-up right upper quadrant ultrasound or hepatobiliary nuclear medicine scan if symptoms persist. Electronically Signed   By: Julian Hy M.D.   On: 05/09/2020 12:00   VAS [place] LOWER EXTREMITY VENOUS (DVT)  Result Date: 05/29/2020  Lower Venous DVT Study Indications: Post-op, and Covid, elevated D-dimer.  Limitations: Shadowing from calcified vessels. Comparison Study: Prior study 05/16/20 - negative Performing Technologist: Rogelia Rohrer  Examination Guidelines: A complete evaluation includes B-mode imaging, spectral Doppler, color Doppler, and power Doppler as needed of all accessible portions of each vessel. Bilateral testing is considered an integral part of a complete examination. Limited examinations for reoccurring indications may be performed as noted. The reflux portion of the exam is performed with the patient in reverse Trendelenburg.  +---------+---------------+---------+-----------+----------+--------------+ RIGHT    CompressibilityPhasicitySpontaneityPropertiesThrombus Aging +---------+---------------+---------+-----------+----------+--------------+ CFV      Full           Yes      Yes                                 +---------+---------------+---------+-----------+----------+--------------+ SFJ      Full                                                        +---------+---------------+---------+-----------+----------+--------------+ FV Prox  Full           Yes      Yes                                 +---------+---------------+---------+-----------+----------+--------------+ FV Mid   Full           Yes      Yes                                 +---------+---------------+---------+-----------+----------+--------------+ FV DistalFull           Yes      Yes                                  +---------+---------------+---------+-----------+----------+--------------+ PFV  Full                                                        +---------+---------------+---------+-----------+----------+--------------+ POP      Full           Yes      Yes                                 +---------+---------------+---------+-----------+----------+--------------+ PTV      Full                                                        +---------+---------------+---------+-----------+----------+--------------+ PERO     Full                                                        +---------+---------------+---------+-----------+----------+--------------+   +---------+---------------+---------+-----------+----------+--------------+ LEFT     CompressibilityPhasicitySpontaneityPropertiesThrombus Aging +---------+---------------+---------+-----------+----------+--------------+ CFV      Full           Yes      Yes                                 +---------+---------------+---------+-----------+----------+--------------+ SFJ      Full                                                        +---------+---------------+---------+-----------+----------+--------------+ FV Prox  Full           Yes      Yes                                 +---------+---------------+---------+-----------+----------+--------------+ FV Mid   Full           Yes      Yes                                 +---------+---------------+---------+-----------+----------+--------------+ FV DistalFull           Yes      Yes                                 +---------+---------------+---------+-----------+----------+--------------+ PFV      Full                                                        +---------+---------------+---------+-----------+----------+--------------+ POP  Full           Yes      Yes                                  +---------+---------------+---------+-----------+----------+--------------+ PTV      Full                                                        +---------+---------------+---------+-----------+----------+--------------+ PERO     Full                                                        +---------+---------------+---------+-----------+----------+--------------+     Summary: BILATERAL: - No evidence of deep vein thrombosis seen in the lower extremities, bilaterally. - RIGHT: - No cystic structure found in the popliteal fossa.  LEFT: - A cystic structure is found in the popliteal fossa.  *See table(s) above for measurements and observations. Electronically signed by Jamelle Haring on 05/29/2020 at 5:53:03 PM.    Final    VAS [place] LOWER EXTREMITY VENOUS (DVT)  Result Date: 05/16/2020  Lower Venous DVT Study Indications: D-dimer, Covid-19.  Risk Factors: Surgery 05-10-2020 Laprascopic cholecystectomy. Anticoagulation: Lovenox. Comparison Study: No prior studies. Performing Technologist: Darlin Coco RDMS  Examination Guidelines: A complete evaluation includes B-mode imaging, spectral Doppler, color Doppler, and power Doppler as needed of all accessible portions of each vessel. Bilateral testing is considered an integral part of a complete examination. Limited examinations for reoccurring indications may be performed as noted. The reflux portion of the exam is performed with the patient in reverse Trendelenburg.  +---------+---------------+---------+-----------+----------+--------------+ RIGHT    CompressibilityPhasicitySpontaneityPropertiesThrombus Aging +---------+---------------+---------+-----------+----------+--------------+ CFV      Full           Yes      Yes                                 +---------+---------------+---------+-----------+----------+--------------+ SFJ      Full                                                         +---------+---------------+---------+-----------+----------+--------------+ FV Prox  Full                                                        +---------+---------------+---------+-----------+----------+--------------+ FV Mid   Full                                                        +---------+---------------+---------+-----------+----------+--------------+ FV DistalFull                                                        +---------+---------------+---------+-----------+----------+--------------+  PFV      Full                                                        +---------+---------------+---------+-----------+----------+--------------+ POP      Full           Yes      Yes                                 +---------+---------------+---------+-----------+----------+--------------+ PTV      Full                                                        +---------+---------------+---------+-----------+----------+--------------+ PERO     Full                                                        +---------+---------------+---------+-----------+----------+--------------+   +---------+---------------+---------+-----------+----------+--------------+ LEFT     CompressibilityPhasicitySpontaneityPropertiesThrombus Aging +---------+---------------+---------+-----------+----------+--------------+ CFV      Full           Yes      Yes                                 +---------+---------------+---------+-----------+----------+--------------+ SFJ      Full                                                        +---------+---------------+---------+-----------+----------+--------------+ FV Prox  Full                                                        +---------+---------------+---------+-----------+----------+--------------+ FV Mid   Full                                                         +---------+---------------+---------+-----------+----------+--------------+ FV DistalFull                                                        +---------+---------------+---------+-----------+----------+--------------+ PFV      Full                                                        +---------+---------------+---------+-----------+----------+--------------+  POP      Full           Yes      Yes                                 +---------+---------------+---------+-----------+----------+--------------+ PTV      Full                                                        +---------+---------------+---------+-----------+----------+--------------+ PERO     Full                                                        +---------+---------------+---------+-----------+----------+--------------+     Summary: RIGHT: - There is no evidence of deep vein thrombosis in the lower extremity.  - No cystic structure found in the popliteal fossa.  LEFT: - There is no evidence of deep vein thrombosis in the lower extremity.  - A cystic structure is found in the popliteal fossa measuring 2.8 cm.  *See table(s) above for measurements and observations. Electronically signed by Ruta Hinds MD on 05/16/2020 at 5:19:00 PM.    Final    US Abdomen Limited RUQ (LIVER/GB)  Result Date: 05/08/2020 CLINICAL DATA:  Right upper quadrant pain EXAM: ULTRASOUND ABDOMEN LIMITED RIGHT UPPER QUADRANT COMPARISON:  None. FINDINGS: Gallbladder: Multiple stones are seen in the gallbladder. No wall thickening, pericholecystic fluid, or Murphy's sign. Common bile duct: Diameter: 6 mm Liver: No focal lesion identified. Within normal limits in parenchymal echogenicity. Portal vein is patent on color Doppler imaging with normal direction of blood flow towards the liver. Other: None. IMPRESSION: 1. Cholelithiasis without wall thickening, pericholecystic fluid, or Murphy's sign. If the clinical picture remains ambiguous,  recommend HIDA scan. 2. The common bile duct is borderline measuring 6.3 mm. Recommend correlation with clinical picture and labs. If there is concern for biliary obstruction, recommend MRCP or ERCP. Electronically Signed   By: Dorise Bullion III M.D   On: 05/08/2020 16:30    Microbiology: Recent Results (from the past 240 hour(s))  Culture, blood (routine x 2)     Status: None   Collection Time: 05/26/20 12:10 AM   Specimen: BLOOD RIGHT FOREARM  Result Value Ref Range Status   Specimen Description   Final    BLOOD RIGHT FOREARM Performed at [facility] [address], [place], [place] [address]    Special Requests   Final    BOTTLES DRAWN AEROBIC AND ANAEROBIC Blood Culture results may not be optimal due to an inadequate volume of blood received in culture bottles Performed at [facility] [address] [place], [place] [address]    Culture   Final    NO GROWTH 5 DAYS Performed at [facility], [place] [address], [place],  [address]    Report Status 06/01/2020 FINAL  Final  C Difficile Quick Screen w PCR reflex     Status: Abnormal   Collection Time: 05/27/20  9:06 AM   Specimen: STOOL  Result Value Ref Range Status   C Diff antigen POSITIVE (A) NEGATIVE  Final   C Diff toxin POSITIVE (A) NEGATIVE Final   C Diff interpretation Toxin producing C. difficile detected.  Final    Comment: CRITICAL RESULT CALLED TO, READ BACK BY AND VERIFIED WITH: BANNO,A @ 1009 ON [id number] BY POTEAT,S Performed at [facility] [address] [place],  [address]   Gastrointestinal Panel by PCR , Stool     Status: None   Collection Time: 05/27/20  9:06 AM   Specimen: STOOL  Result Value Ref Range Status   Campylobacter species NOT DETECTED NOT DETECTED Final   Plesimonas shigelloides NOT DETECTED NOT DETECTED Final   Salmonella species NOT DETECTED NOT DETECTED Final   Yersinia enterocolitica NOT DETECTED NOT DETECTED  Final   Vibrio species NOT DETECTED NOT DETECTED Final   Vibrio cholerae NOT DETECTED NOT DETECTED Final   Enteroaggregative E coli (EAEC) NOT DETECTED NOT DETECTED Final   Enteropathogenic E coli (EPEC) NOT DETECTED NOT DETECTED Final   Enterotoxigenic E coli (ETEC) NOT DETECTED NOT DETECTED Final   Shiga like toxin producing E coli (STEC) NOT DETECTED NOT DETECTED Final   Shigella/Enteroinvasive E coli (EIEC) NOT DETECTED NOT DETECTED Final   Cryptosporidium NOT DETECTED NOT DETECTED Final   Cyclospora cayetanensis NOT DETECTED NOT DETECTED Final   Entamoeba histolytica NOT DETECTED NOT DETECTED Final   Giardia lamblia NOT DETECTED NOT DETECTED Final   Adenovirus F40/41 NOT DETECTED NOT DETECTED Final   Astrovirus NOT DETECTED NOT DETECTED Final   Norovirus GI/GII NOT DETECTED NOT DETECTED Final   Rotavirus A NOT DETECTED NOT DETECTED Final   Sapovirus (I, II, IV, and V) NOT DETECTED NOT DETECTED Final    Comment: Performed at [facility], [address] [place],  [address]     Labs: Basic Metabolic Panel: Recent Labs  Lab 05/30/20 0512 05/31/20 0537 06/01/20 0613 06/02/20 0634 06/02/20 1630 06/03/20 0502  NA 141 142 141 141 140 143  K 3.2* 3.7 4.0 3.7 3.9 3.5  CL 112* 114* 111 110 106 109  CO2 20* 20* 22 25 24 24   GLUCOSE 87 93 93 95 148* 91  BUN 19 15 13 14 14 12   CREATININE 1.00 1.06 1.17 1.27* 1.32* 1.18  CALCIUM 7.7* 7.7* 7.7* 7.9* 8.1* 8.1*  MG 1.9 1.9  --   --   --   --    Liver Function Tests: Recent Labs  Lab 05/28/20 0545 05/29/20 0638 05/30/20 0512 05/31/20 0537  AST 15 14* 15 18  ALT 15 13 12 14   ALKPHOS 76 75 78 71  BILITOT 0.6 0.4 0.4 0.5  PROT 5.1* 5.0* 4.9* 4.9*  ALBUMIN 2.2* 2.1* 1.9* 2.0*   No results for input(s): LIPASE, AMYLASE in the last 168 hours. No results for input(s): AMMONIA in the last 168 hours. CBC: Recent Labs  Lab 05/28/20 0545 05/29/20 [id number] 05/30/20 0512 05/31/20 0537 06/01/20 0613  WBC 16.9* 13.9*  10.1 8.5 9.7  NEUTROABS 14.8* 11.4* 7.8* 5.6  --   HGB 7.9* 7.8* 8.6* 8.9* 9.3*  HCT 25.0* 24.9* 27.2* 28.0* 29.1*  MCV 93.6 94.7 92.8 92.1 91.5  PLT 307 285 332 375 406*   Cardiac Enzymes: No results for input(s): CKTOTAL, CKMB, CKMBINDEX, TROPONINI in the last 168 hours. BNP: BNP (last 3 results) No results for input(s): BNP in the last 8760 hours.  ProBNP (last 3 results) No results for input(s): PROBNP in the last 8760 hours.  CBG: No results for input(s): GLUCAP in the last 168 hours.  Signed:  Kayleen Memos, MD [facility] 06/03/2020, 11:32 AM

## 2020-06-05 DIAGNOSIS — M1990 Unspecified osteoarthritis, unspecified site: Secondary | ICD-10-CM | POA: Diagnosis not present

## 2020-06-05 DIAGNOSIS — K5732 Diverticulitis of large intestine without perforation or abscess without bleeding: Secondary | ICD-10-CM | POA: Diagnosis not present

## 2020-06-05 DIAGNOSIS — Z7982 Long term (current) use of aspirin: Secondary | ICD-10-CM | POA: Diagnosis not present

## 2020-06-05 DIAGNOSIS — E785 Hyperlipidemia, unspecified: Secondary | ICD-10-CM | POA: Diagnosis not present

## 2020-06-05 DIAGNOSIS — Z792 Long term (current) use of antibiotics: Secondary | ICD-10-CM | POA: Diagnosis not present

## 2020-06-05 DIAGNOSIS — I130 Hypertensive heart and chronic kidney disease with heart failure and stage 1 through stage 4 chronic kidney disease, or unspecified chronic kidney disease: Secondary | ICD-10-CM | POA: Diagnosis not present

## 2020-06-05 DIAGNOSIS — I493 Ventricular premature depolarization: Secondary | ICD-10-CM | POA: Diagnosis not present

## 2020-06-05 DIAGNOSIS — U071 COVID-19: Secondary | ICD-10-CM | POA: Diagnosis not present

## 2020-06-05 DIAGNOSIS — M353 Polymyalgia rheumatica: Secondary | ICD-10-CM | POA: Diagnosis not present

## 2020-06-05 DIAGNOSIS — R197 Diarrhea, unspecified: Secondary | ICD-10-CM | POA: Diagnosis not present

## 2020-06-05 DIAGNOSIS — I5042 Chronic combined systolic (congestive) and diastolic (congestive) heart failure: Secondary | ICD-10-CM | POA: Diagnosis not present

## 2020-06-05 DIAGNOSIS — D631 Anemia in chronic kidney disease: Secondary | ICD-10-CM | POA: Diagnosis not present

## 2020-06-05 DIAGNOSIS — Z951 Presence of aortocoronary bypass graft: Secondary | ICD-10-CM | POA: Diagnosis not present

## 2020-06-05 DIAGNOSIS — I447 Left bundle-branch block, unspecified: Secondary | ICD-10-CM | POA: Diagnosis not present

## 2020-06-05 DIAGNOSIS — A0471 Enterocolitis due to Clostridium difficile, recurrent: Secondary | ICD-10-CM | POA: Diagnosis not present

## 2020-06-05 DIAGNOSIS — N183 Chronic kidney disease, stage 3 unspecified: Secondary | ICD-10-CM | POA: Diagnosis not present

## 2020-06-05 DIAGNOSIS — R531 Weakness: Secondary | ICD-10-CM | POA: Diagnosis not present

## 2020-06-05 DIAGNOSIS — G4730 Sleep apnea, unspecified: Secondary | ICD-10-CM | POA: Diagnosis not present

## 2020-06-05 DIAGNOSIS — I2510 Atherosclerotic heart disease of native coronary artery without angina pectoris: Secondary | ICD-10-CM | POA: Diagnosis not present

## 2020-06-05 DIAGNOSIS — I471 Supraventricular tachycardia: Secondary | ICD-10-CM | POA: Diagnosis not present

## 2020-06-05 DIAGNOSIS — Z9181 History of falling: Secondary | ICD-10-CM | POA: Diagnosis not present

## 2020-06-05 DIAGNOSIS — E039 Hypothyroidism, unspecified: Secondary | ICD-10-CM | POA: Diagnosis not present

## 2020-06-05 DIAGNOSIS — Z9049 Acquired absence of other specified parts of digestive tract: Secondary | ICD-10-CM | POA: Diagnosis not present

## 2020-06-09 DIAGNOSIS — E039 Hypothyroidism, unspecified: Secondary | ICD-10-CM | POA: Diagnosis not present

## 2020-06-09 DIAGNOSIS — A0472 Enterocolitis due to Clostridium difficile, not specified as recurrent: Secondary | ICD-10-CM | POA: Diagnosis not present

## 2020-06-09 DIAGNOSIS — K219 Gastro-esophageal reflux disease without esophagitis: Secondary | ICD-10-CM | POA: Diagnosis not present

## 2020-06-09 DIAGNOSIS — E611 Iron deficiency: Secondary | ICD-10-CM | POA: Diagnosis not present

## 2020-06-09 DIAGNOSIS — I2510 Atherosclerotic heart disease of native coronary artery without angina pectoris: Secondary | ICD-10-CM | POA: Diagnosis not present

## 2020-06-09 DIAGNOSIS — D649 Anemia, unspecified: Secondary | ICD-10-CM | POA: Diagnosis not present

## 2020-06-14 DIAGNOSIS — K5732 Diverticulitis of large intestine without perforation or abscess without bleeding: Secondary | ICD-10-CM | POA: Diagnosis not present

## 2020-06-14 DIAGNOSIS — R531 Weakness: Secondary | ICD-10-CM | POA: Diagnosis not present

## 2020-06-14 DIAGNOSIS — I130 Hypertensive heart and chronic kidney disease with heart failure and stage 1 through stage 4 chronic kidney disease, or unspecified chronic kidney disease: Secondary | ICD-10-CM | POA: Diagnosis not present

## 2020-06-14 DIAGNOSIS — Z9049 Acquired absence of other specified parts of digestive tract: Secondary | ICD-10-CM | POA: Diagnosis not present

## 2020-06-14 DIAGNOSIS — Z7982 Long term (current) use of aspirin: Secondary | ICD-10-CM | POA: Diagnosis not present

## 2020-06-14 DIAGNOSIS — A0471 Enterocolitis due to Clostridium difficile, recurrent: Secondary | ICD-10-CM | POA: Diagnosis not present

## 2020-06-14 DIAGNOSIS — I5042 Chronic combined systolic (congestive) and diastolic (congestive) heart failure: Secondary | ICD-10-CM | POA: Diagnosis not present

## 2020-06-14 DIAGNOSIS — I447 Left bundle-branch block, unspecified: Secondary | ICD-10-CM | POA: Diagnosis not present

## 2020-06-14 DIAGNOSIS — E785 Hyperlipidemia, unspecified: Secondary | ICD-10-CM | POA: Diagnosis not present

## 2020-06-14 DIAGNOSIS — I2510 Atherosclerotic heart disease of native coronary artery without angina pectoris: Secondary | ICD-10-CM | POA: Diagnosis not present

## 2020-06-14 DIAGNOSIS — Z792 Long term (current) use of antibiotics: Secondary | ICD-10-CM | POA: Diagnosis not present

## 2020-06-14 DIAGNOSIS — D631 Anemia in chronic kidney disease: Secondary | ICD-10-CM | POA: Diagnosis not present

## 2020-06-14 DIAGNOSIS — M1990 Unspecified osteoarthritis, unspecified site: Secondary | ICD-10-CM | POA: Diagnosis not present

## 2020-06-14 DIAGNOSIS — U071 COVID-19: Secondary | ICD-10-CM | POA: Diagnosis not present

## 2020-06-14 DIAGNOSIS — I493 Ventricular premature depolarization: Secondary | ICD-10-CM | POA: Diagnosis not present

## 2020-06-14 DIAGNOSIS — N183 Chronic kidney disease, stage 3 unspecified: Secondary | ICD-10-CM | POA: Diagnosis not present

## 2020-06-14 DIAGNOSIS — Z951 Presence of aortocoronary bypass graft: Secondary | ICD-10-CM | POA: Diagnosis not present

## 2020-06-14 DIAGNOSIS — E039 Hypothyroidism, unspecified: Secondary | ICD-10-CM | POA: Diagnosis not present

## 2020-06-14 DIAGNOSIS — M353 Polymyalgia rheumatica: Secondary | ICD-10-CM | POA: Diagnosis not present

## 2020-06-14 DIAGNOSIS — G4730 Sleep apnea, unspecified: Secondary | ICD-10-CM | POA: Diagnosis not present

## 2020-06-14 DIAGNOSIS — R197 Diarrhea, unspecified: Secondary | ICD-10-CM | POA: Diagnosis not present

## 2020-06-14 DIAGNOSIS — Z9181 History of falling: Secondary | ICD-10-CM | POA: Diagnosis not present

## 2020-06-14 DIAGNOSIS — I471 Supraventricular tachycardia: Secondary | ICD-10-CM | POA: Diagnosis not present

## 2020-06-15 DIAGNOSIS — K5792 Diverticulitis of intestine, part unspecified, without perforation or abscess without bleeding: Secondary | ICD-10-CM | POA: Diagnosis not present

## 2020-06-15 DIAGNOSIS — D649 Anemia, unspecified: Secondary | ICD-10-CM | POA: Diagnosis not present

## 2020-06-15 DIAGNOSIS — G629 Polyneuropathy, unspecified: Secondary | ICD-10-CM | POA: Diagnosis not present

## 2020-06-15 DIAGNOSIS — E039 Hypothyroidism, unspecified: Secondary | ICD-10-CM | POA: Diagnosis not present

## 2020-06-15 DIAGNOSIS — I2510 Atherosclerotic heart disease of native coronary artery without angina pectoris: Secondary | ICD-10-CM | POA: Diagnosis not present

## 2020-06-15 DIAGNOSIS — A0472 Enterocolitis due to Clostridium difficile, not specified as recurrent: Secondary | ICD-10-CM | POA: Diagnosis not present

## 2020-06-15 DIAGNOSIS — I509 Heart failure, unspecified: Secondary | ICD-10-CM | POA: Diagnosis not present

## 2020-06-17 DIAGNOSIS — H52203 Unspecified astigmatism, bilateral: Secondary | ICD-10-CM | POA: Diagnosis not present

## 2020-06-17 DIAGNOSIS — Z961 Presence of intraocular lens: Secondary | ICD-10-CM | POA: Diagnosis not present

## 2020-07-05 DIAGNOSIS — U071 COVID-19: Secondary | ICD-10-CM | POA: Diagnosis not present

## 2020-07-05 DIAGNOSIS — I2510 Atherosclerotic heart disease of native coronary artery without angina pectoris: Secondary | ICD-10-CM | POA: Diagnosis not present

## 2020-07-05 DIAGNOSIS — E785 Hyperlipidemia, unspecified: Secondary | ICD-10-CM | POA: Diagnosis not present

## 2020-07-05 DIAGNOSIS — I5042 Chronic combined systolic (congestive) and diastolic (congestive) heart failure: Secondary | ICD-10-CM | POA: Diagnosis not present

## 2020-07-05 DIAGNOSIS — N183 Chronic kidney disease, stage 3 unspecified: Secondary | ICD-10-CM | POA: Diagnosis not present

## 2020-07-05 DIAGNOSIS — M1990 Unspecified osteoarthritis, unspecified site: Secondary | ICD-10-CM | POA: Diagnosis not present

## 2020-07-05 DIAGNOSIS — Z9181 History of falling: Secondary | ICD-10-CM | POA: Diagnosis not present

## 2020-07-05 DIAGNOSIS — I447 Left bundle-branch block, unspecified: Secondary | ICD-10-CM | POA: Diagnosis not present

## 2020-07-05 DIAGNOSIS — I130 Hypertensive heart and chronic kidney disease with heart failure and stage 1 through stage 4 chronic kidney disease, or unspecified chronic kidney disease: Secondary | ICD-10-CM | POA: Diagnosis not present

## 2020-07-05 DIAGNOSIS — E039 Hypothyroidism, unspecified: Secondary | ICD-10-CM | POA: Diagnosis not present

## 2020-07-05 DIAGNOSIS — I493 Ventricular premature depolarization: Secondary | ICD-10-CM | POA: Diagnosis not present

## 2020-07-05 DIAGNOSIS — A0471 Enterocolitis due to Clostridium difficile, recurrent: Secondary | ICD-10-CM | POA: Diagnosis not present

## 2020-07-05 DIAGNOSIS — Z951 Presence of aortocoronary bypass graft: Secondary | ICD-10-CM | POA: Diagnosis not present

## 2020-07-05 DIAGNOSIS — Z7982 Long term (current) use of aspirin: Secondary | ICD-10-CM | POA: Diagnosis not present

## 2020-07-05 DIAGNOSIS — D631 Anemia in chronic kidney disease: Secondary | ICD-10-CM | POA: Diagnosis not present

## 2020-07-05 DIAGNOSIS — R531 Weakness: Secondary | ICD-10-CM | POA: Diagnosis not present

## 2020-07-05 DIAGNOSIS — G4730 Sleep apnea, unspecified: Secondary | ICD-10-CM | POA: Diagnosis not present

## 2020-07-05 DIAGNOSIS — Z792 Long term (current) use of antibiotics: Secondary | ICD-10-CM | POA: Diagnosis not present

## 2020-07-05 DIAGNOSIS — Z9049 Acquired absence of other specified parts of digestive tract: Secondary | ICD-10-CM | POA: Diagnosis not present

## 2020-07-05 DIAGNOSIS — R197 Diarrhea, unspecified: Secondary | ICD-10-CM | POA: Diagnosis not present

## 2020-07-05 DIAGNOSIS — M353 Polymyalgia rheumatica: Secondary | ICD-10-CM | POA: Diagnosis not present

## 2020-07-05 DIAGNOSIS — K5732 Diverticulitis of large intestine without perforation or abscess without bleeding: Secondary | ICD-10-CM | POA: Diagnosis not present

## 2020-07-05 DIAGNOSIS — I471 Supraventricular tachycardia: Secondary | ICD-10-CM | POA: Diagnosis not present

## 2020-07-06 DIAGNOSIS — I509 Heart failure, unspecified: Secondary | ICD-10-CM | POA: Diagnosis not present

## 2020-07-06 DIAGNOSIS — G629 Polyneuropathy, unspecified: Secondary | ICD-10-CM | POA: Diagnosis not present

## 2020-07-06 DIAGNOSIS — K8070 Calculus of gallbladder and bile duct without cholecystitis without obstruction: Secondary | ICD-10-CM | POA: Diagnosis not present

## 2020-07-06 DIAGNOSIS — K8689 Other specified diseases of pancreas: Secondary | ICD-10-CM | POA: Diagnosis not present

## 2020-07-06 DIAGNOSIS — K5792 Diverticulitis of intestine, part unspecified, without perforation or abscess without bleeding: Secondary | ICD-10-CM | POA: Diagnosis not present

## 2020-07-06 DIAGNOSIS — I2510 Atherosclerotic heart disease of native coronary artery without angina pectoris: Secondary | ICD-10-CM | POA: Diagnosis not present

## 2020-07-06 DIAGNOSIS — A0472 Enterocolitis due to Clostridium difficile, not specified as recurrent: Secondary | ICD-10-CM | POA: Diagnosis not present

## 2020-07-06 DIAGNOSIS — D649 Anemia, unspecified: Secondary | ICD-10-CM | POA: Diagnosis not present

## 2020-07-07 DIAGNOSIS — R197 Diarrhea, unspecified: Secondary | ICD-10-CM | POA: Diagnosis not present

## 2020-08-07 ENCOUNTER — Other Ambulatory Visit: Payer: Self-pay | Admitting: Student

## 2020-08-08 DIAGNOSIS — D649 Anemia, unspecified: Secondary | ICD-10-CM | POA: Diagnosis not present

## 2020-08-08 DIAGNOSIS — K5792 Diverticulitis of intestine, part unspecified, without perforation or abscess without bleeding: Secondary | ICD-10-CM | POA: Diagnosis not present

## 2020-08-08 DIAGNOSIS — K8689 Other specified diseases of pancreas: Secondary | ICD-10-CM | POA: Diagnosis not present

## 2020-08-08 DIAGNOSIS — G629 Polyneuropathy, unspecified: Secondary | ICD-10-CM | POA: Diagnosis not present

## 2020-08-08 DIAGNOSIS — I2510 Atherosclerotic heart disease of native coronary artery without angina pectoris: Secondary | ICD-10-CM | POA: Diagnosis not present

## 2020-08-08 DIAGNOSIS — I509 Heart failure, unspecified: Secondary | ICD-10-CM | POA: Diagnosis not present

## 2020-09-13 DIAGNOSIS — H6121 Impacted cerumen, right ear: Secondary | ICD-10-CM | POA: Diagnosis not present

## 2020-09-13 DIAGNOSIS — H6122 Impacted cerumen, left ear: Secondary | ICD-10-CM | POA: Diagnosis not present

## 2020-09-13 DIAGNOSIS — H6123 Impacted cerumen, bilateral: Secondary | ICD-10-CM | POA: Diagnosis not present

## 2020-09-27 DIAGNOSIS — I130 Hypertensive heart and chronic kidney disease with heart failure and stage 1 through stage 4 chronic kidney disease, or unspecified chronic kidney disease: Secondary | ICD-10-CM | POA: Diagnosis not present

## 2020-09-27 DIAGNOSIS — I509 Heart failure, unspecified: Secondary | ICD-10-CM | POA: Diagnosis not present

## 2020-09-27 DIAGNOSIS — N1831 Chronic kidney disease, stage 3a: Secondary | ICD-10-CM | POA: Diagnosis not present

## 2020-09-27 DIAGNOSIS — E785 Hyperlipidemia, unspecified: Secondary | ICD-10-CM | POA: Diagnosis not present

## 2020-10-19 ENCOUNTER — Ambulatory Visit: Payer: PPO | Admitting: Internal Medicine

## 2020-10-19 ENCOUNTER — Encounter: Payer: Self-pay | Admitting: Internal Medicine

## 2020-10-19 ENCOUNTER — Other Ambulatory Visit: Payer: Self-pay

## 2020-10-19 VITALS — BP 110/62 | HR 78 | Ht 72.0 in | Wt 139.0 lb

## 2020-10-19 DIAGNOSIS — I2583 Coronary atherosclerosis due to lipid rich plaque: Secondary | ICD-10-CM

## 2020-10-19 DIAGNOSIS — I493 Ventricular premature depolarization: Secondary | ICD-10-CM | POA: Diagnosis not present

## 2020-10-19 DIAGNOSIS — I2510 Atherosclerotic heart disease of native coronary artery without angina pectoris: Secondary | ICD-10-CM

## 2020-10-19 DIAGNOSIS — Z951 Presence of aortocoronary bypass graft: Secondary | ICD-10-CM

## 2020-10-19 NOTE — Patient Instructions (Signed)

## 2020-10-19 NOTE — Progress Notes (Signed)
HPI [name] returns for followup of his symptomatic PVC's and PVC induced CM. He has undergone cholecystectomy complicated by C.Diff which has taken months to improve. He has not had more falls. He is using a walker. He remains off of the amiodarone.  No PVC"s. Allergies  Allergen Reactions   Atenolol Diarrhea and Other (See Comments)    Very weak, fatigued, lethargic, no appetite   Ramipril Other (See Comments)   Statins Other (See Comments)     Current Outpatient Medications  Medication Sig Dispense Refill   acidophilus (RISAQUAD) CAPS capsule TAKE 1 CAPSULE BY MOUTH DAILY. 90 capsule 0   aspirin 81 MG tablet Take 81 mg by mouth daily.     Evolocumab (REPATHA SURECLICK) 301 MG/ML SOAJ Inject 140 mg into the skin every 28 (twenty-eight) days.     ezetimibe (ZETIA) 10 MG tablet Take 10 mg by mouth daily.     ferrous sulfate 325 (65 FE) MG tablet Take 1 tablet (325 mg total) by mouth daily. 30 tablet 3   isosorbide mononitrate (IMDUR) 30 MG 24 hr tablet TAKE ONE TABLET BY MOUTH DAILY 90 tablet 2   levothyroxine (SYNTHROID, LEVOTHROID) 125 MCG tablet Take 1 tablet by mouth daily before breakfast.     mirtazapine (REMERON) 15 MG tablet Take 15 mg by mouth at bedtime.     nitroGLYCERIN (NITROSTAT) 0.4 MG SL tablet Place 0.4 mg under the tongue every 5 (five) minutes as needed for chest pain.     vancomycin (VANCOCIN) 125 MG capsule TAKE 1 CAPSULE BY MOUTH 4 TIMES DAILY FOR 7 DAYS 28 capsule 0   CREON 36000-114000 units CPEP capsule Take by mouth.     No current facility-administered medications for this visit.     Past Medical History:  Diagnosis Date   Anemia    Arthritis    "touch in the fingers" (11/17/2014)   Chronic combined systolic and diastolic CHF (congestive heart failure) ([place])    a. 11/17/14 showed EF 25-30%, suboptimal image quality, diffuse hypokinesis worse in the inferior wall, mild to moderate MR. New drop EF compared to 2012 but cath was stable.   CKD  (chronic kidney disease), stage III (HCC)    Coronary artery disease    a. s/p 2v CABG (seq LIMA to Diag and LAD, SVG to OM) on 05/15/2001.   Fibromyalgia    "a long time ago" (11/17/2014)   History of gout    Hyperlipidemia    Hypothyroidism    LBBB (left bundle branch block)    Melanoma of back (HCC)    NSVT (nonsustained ventricular tachycardia) (HCC)    PMR (polymyalgia rheumatica) (HCC)    PVC's (premature ventricular contractions)    Sleep apnea    "wife says I do" (11/18/2014)    ROS:   All systems reviewed and negative except as noted in the HPI.   Past Surgical History:  Procedure Laterality Date   AXILLARY LYMPH NODE DISSECTION Bilateral 2001   CARDIAC CATHETERIZATION  04/28/2001   CARDIAC CATHETERIZATION N/A 11/19/2014   Procedure: Right/Left Heart Cath and Coronary/Graft Angiography;  Surgeon: Jettie Booze, MD;  Location: [facility];  Service: Cardiovascular;  Laterality: N/A;   CATARACT EXTRACTION W/ INTRAOCULAR LENS  IMPLANT, BILATERAL Bilateral ~ 2010   CHOLECYSTECTOMY N/A 05/10/2020   Procedure: LAPAROSCOPIC CHOLECYSTECTOMY WITH INTRAOPERATIVE CHOLANGIOGRAM;  Surgeon: Coralie Keens, MD;  Location: [facility];  Service: General;  Laterality: N/A;   CORONARY ARTERY BYPASS GRAFT  05/15/2001  x3 sequential LIMA to diag and LAD, SVG to OM    ERCP N/A 05/11/2020   Procedure: ENDOSCOPIC RETROGRADE CHOLANGIOPANCREATOGRAPHY (ERCP);  Surgeon: Clarene Essex, MD;  Location: [facility];  Service: Endoscopy;  Laterality: N/A;   MELANOMA EXCISION  2001   off of back   REMOVAL OF STONES  05/11/2020   Procedure: REMOVAL OF STONES;  Surgeon: Clarene Essex, MD;  Location: [facility];  Service: Endoscopy;;   SPHINCTEROTOMY  05/11/2020   Procedure: Joan Mayans;  Surgeon: Clarene Essex, MD;  Location: [facility];  Service: Endoscopy;;     Family History  Problem Relation Age of Onset   Prostate cancer Father    Liver cancer Mother    Hyperlipidemia Son     Hyperlipidemia Daughter      Social History   Socioeconomic History   Marital status: Married    Spouse name: Not on file   Number of children: Not on file   Years of education: Not on file   Highest education level: Not on file  Occupational History   Not on file  Tobacco Use   Smoking status: Never   Smokeless tobacco: Never  Substance and Sexual Activity   Alcohol use: Yes    Alcohol/week: 6.0 standard drinks    Types: 3 Glasses of wine, 3 Cans of beer per week   Drug use: No   Sexual activity: Never  Other Topics Concern   Not on file  Social History Narrative   Not on file   Social Determinants of Health   Financial Resource Strain: Not on file  Food Insecurity: Not on file  Transportation Needs: Not on file  Physical Activity: Not on file  Stress: Not on file  Social Connections: Not on file  Intimate Partner Violence: Not on file     BP 110/62   Pulse 78   Ht 6' (1.829 m)   Wt 139 lb (63 kg)   SpO2 99%   BMI 18.85 kg/m   Physical Exam:  Frail but well appearing NAD HEENT: Unremarkable Neck:  No JVD, no thyromegally Lymphatics:  No adenopathy Back:  No CVA tenderness Lungs:  Clear with no wheezes HEART:  Regular rate rhythm, no murmurs, no rubs, no clicks Abd:  soft, positive bowel sounds, no organomegally, no rebound, no guarding Ext:  2 plus pulses, no edema, no cyanosis, no clubbing Skin:  No rashes no nodules Neuro:  CN II through XII intact, motor grossly intact  EKG - nsr   Assess/Plan:  1. PVC - he is stable and has not had any additional symptoms. He will remain off of amiodarone for now.  2. PVC induced CM - his EF had normalized at last echo. He will continue his current meds.  3.  Coronary artery disease -despite his advanced age, he has no anginal symptoms. 4. Weight loss - this is due to the C.Diff. He is encouraged to increase his fat intake.  Carleene Overlie Analiz Tvedt,MD

## 2020-10-27 DIAGNOSIS — I509 Heart failure, unspecified: Secondary | ICD-10-CM | POA: Diagnosis not present

## 2020-10-27 DIAGNOSIS — I130 Hypertensive heart and chronic kidney disease with heart failure and stage 1 through stage 4 chronic kidney disease, or unspecified chronic kidney disease: Secondary | ICD-10-CM | POA: Diagnosis not present

## 2020-10-27 DIAGNOSIS — E785 Hyperlipidemia, unspecified: Secondary | ICD-10-CM | POA: Diagnosis not present

## 2020-10-27 DIAGNOSIS — N1831 Chronic kidney disease, stage 3a: Secondary | ICD-10-CM | POA: Diagnosis not present

## 2020-11-14 DIAGNOSIS — Z1159 Encounter for screening for other viral diseases: Secondary | ICD-10-CM | POA: Diagnosis not present

## 2020-11-14 DIAGNOSIS — Z20828 Contact with and (suspected) exposure to other viral communicable diseases: Secondary | ICD-10-CM | POA: Diagnosis not present

## 2020-11-27 DIAGNOSIS — I130 Hypertensive heart and chronic kidney disease with heart failure and stage 1 through stage 4 chronic kidney disease, or unspecified chronic kidney disease: Secondary | ICD-10-CM | POA: Diagnosis not present

## 2020-11-27 DIAGNOSIS — N1831 Chronic kidney disease, stage 3a: Secondary | ICD-10-CM | POA: Diagnosis not present

## 2020-11-27 DIAGNOSIS — I509 Heart failure, unspecified: Secondary | ICD-10-CM | POA: Diagnosis not present

## 2020-11-27 DIAGNOSIS — E785 Hyperlipidemia, unspecified: Secondary | ICD-10-CM | POA: Diagnosis not present

## 2020-11-28 DIAGNOSIS — Z1159 Encounter for screening for other viral diseases: Secondary | ICD-10-CM | POA: Diagnosis not present

## 2020-11-28 DIAGNOSIS — Z20828 Contact with and (suspected) exposure to other viral communicable diseases: Secondary | ICD-10-CM | POA: Diagnosis not present

## 2020-12-05 DIAGNOSIS — Z8616 Personal history of COVID-19: Secondary | ICD-10-CM | POA: Diagnosis not present

## 2020-12-12 DIAGNOSIS — Z20828 Contact with and (suspected) exposure to other viral communicable diseases: Secondary | ICD-10-CM | POA: Diagnosis not present

## 2020-12-19 DIAGNOSIS — R7301 Impaired fasting glucose: Secondary | ICD-10-CM | POA: Diagnosis not present

## 2020-12-19 DIAGNOSIS — E785 Hyperlipidemia, unspecified: Secondary | ICD-10-CM | POA: Diagnosis not present

## 2020-12-19 DIAGNOSIS — E291 Testicular hypofunction: Secondary | ICD-10-CM | POA: Diagnosis not present

## 2020-12-19 DIAGNOSIS — Z125 Encounter for screening for malignant neoplasm of prostate: Secondary | ICD-10-CM | POA: Diagnosis not present

## 2020-12-19 DIAGNOSIS — M109 Gout, unspecified: Secondary | ICD-10-CM | POA: Diagnosis not present

## 2020-12-19 DIAGNOSIS — E039 Hypothyroidism, unspecified: Secondary | ICD-10-CM | POA: Diagnosis not present

## 2020-12-19 DIAGNOSIS — Z8616 Personal history of COVID-19: Secondary | ICD-10-CM | POA: Diagnosis not present

## 2020-12-26 DIAGNOSIS — I6529 Occlusion and stenosis of unspecified carotid artery: Secondary | ICD-10-CM | POA: Diagnosis not present

## 2020-12-26 DIAGNOSIS — E039 Hypothyroidism, unspecified: Secondary | ICD-10-CM | POA: Diagnosis not present

## 2020-12-26 DIAGNOSIS — G629 Polyneuropathy, unspecified: Secondary | ICD-10-CM | POA: Diagnosis not present

## 2020-12-26 DIAGNOSIS — I2510 Atherosclerotic heart disease of native coronary artery without angina pectoris: Secondary | ICD-10-CM | POA: Diagnosis not present

## 2020-12-26 DIAGNOSIS — Z1331 Encounter for screening for depression: Secondary | ICD-10-CM | POA: Diagnosis not present

## 2020-12-26 DIAGNOSIS — I130 Hypertensive heart and chronic kidney disease with heart failure and stage 1 through stage 4 chronic kidney disease, or unspecified chronic kidney disease: Secondary | ICD-10-CM | POA: Diagnosis not present

## 2020-12-26 DIAGNOSIS — Z1389 Encounter for screening for other disorder: Secondary | ICD-10-CM | POA: Diagnosis not present

## 2020-12-26 DIAGNOSIS — Z20828 Contact with and (suspected) exposure to other viral communicable diseases: Secondary | ICD-10-CM | POA: Diagnosis not present

## 2020-12-26 DIAGNOSIS — N1831 Chronic kidney disease, stage 3a: Secondary | ICD-10-CM | POA: Diagnosis not present

## 2020-12-26 DIAGNOSIS — I509 Heart failure, unspecified: Secondary | ICD-10-CM | POA: Diagnosis not present

## 2020-12-26 DIAGNOSIS — E785 Hyperlipidemia, unspecified: Secondary | ICD-10-CM | POA: Diagnosis not present

## 2020-12-26 DIAGNOSIS — I25119 Atherosclerotic heart disease of native coronary artery with unspecified angina pectoris: Secondary | ICD-10-CM | POA: Diagnosis not present

## 2020-12-26 DIAGNOSIS — Z0000 Encounter for general adult medical examination without abnormal findings: Secondary | ICD-10-CM | POA: Diagnosis not present

## 2020-12-26 DIAGNOSIS — R269 Unspecified abnormalities of gait and mobility: Secondary | ICD-10-CM | POA: Diagnosis not present

## 2020-12-26 DIAGNOSIS — I493 Ventricular premature depolarization: Secondary | ICD-10-CM | POA: Diagnosis not present

## 2020-12-26 DIAGNOSIS — R82998 Other abnormal findings in urine: Secondary | ICD-10-CM | POA: Diagnosis not present

## 2020-12-28 DIAGNOSIS — I130 Hypertensive heart and chronic kidney disease with heart failure and stage 1 through stage 4 chronic kidney disease, or unspecified chronic kidney disease: Secondary | ICD-10-CM | POA: Diagnosis not present

## 2020-12-28 DIAGNOSIS — I509 Heart failure, unspecified: Secondary | ICD-10-CM | POA: Diagnosis not present

## 2020-12-28 DIAGNOSIS — E785 Hyperlipidemia, unspecified: Secondary | ICD-10-CM | POA: Diagnosis not present

## 2020-12-28 DIAGNOSIS — N1831 Chronic kidney disease, stage 3a: Secondary | ICD-10-CM | POA: Diagnosis not present

## 2021-01-02 DIAGNOSIS — Z20828 Contact with and (suspected) exposure to other viral communicable diseases: Secondary | ICD-10-CM | POA: Diagnosis not present

## 2021-01-09 DIAGNOSIS — Z20828 Contact with and (suspected) exposure to other viral communicable diseases: Secondary | ICD-10-CM | POA: Diagnosis not present

## 2021-01-16 DIAGNOSIS — Z20828 Contact with and (suspected) exposure to other viral communicable diseases: Secondary | ICD-10-CM | POA: Diagnosis not present

## 2021-01-16 IMAGING — US US ABDOMEN LIMITED
1 series · 14 of 25 positions shown · non-contrast
Comparison: None.

CLINICAL DATA: Right upper quadrant pain

EXAM:
ULTRASOUND ABDOMEN LIMITED RIGHT UPPER QUADRANT

[Series 1: us abdomen limited · 14 of 35 slices shown]
[im 1/35]
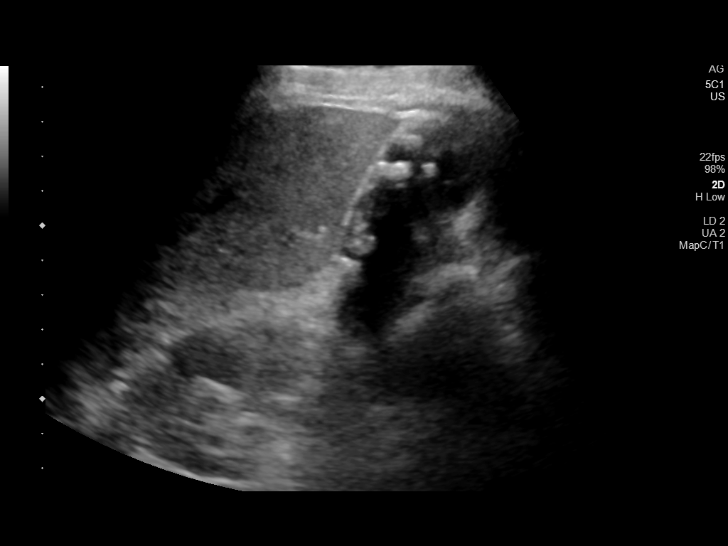
[im 3/35]
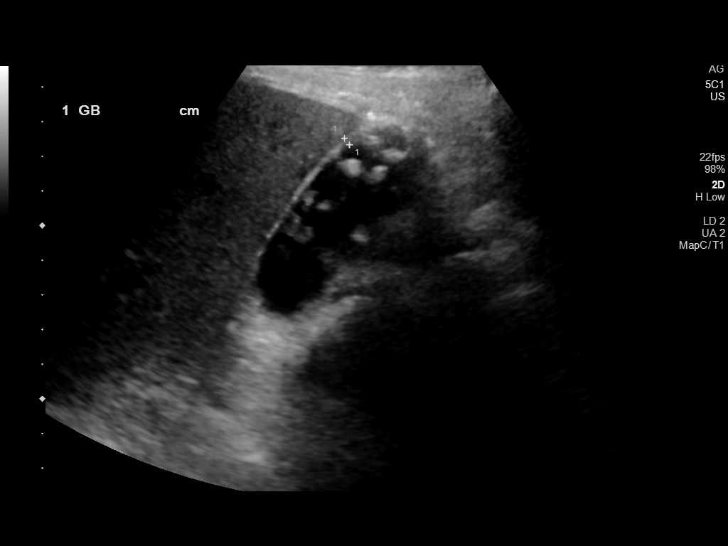
[im 6/35]
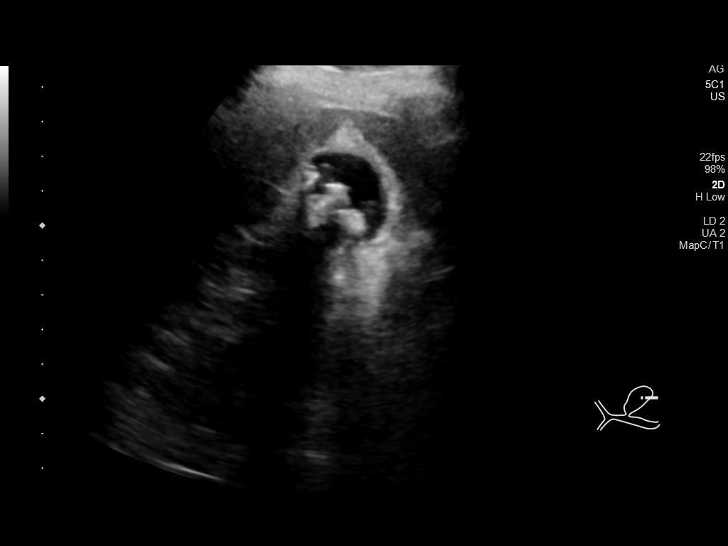
[im 9/35]
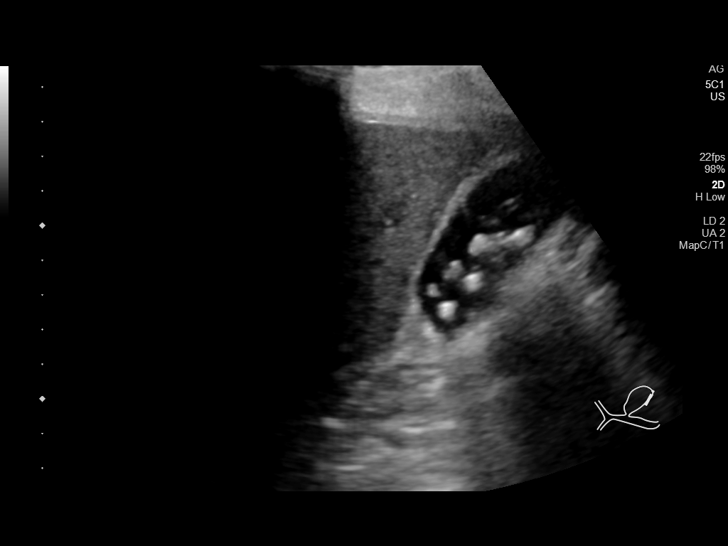
[im 12/35]
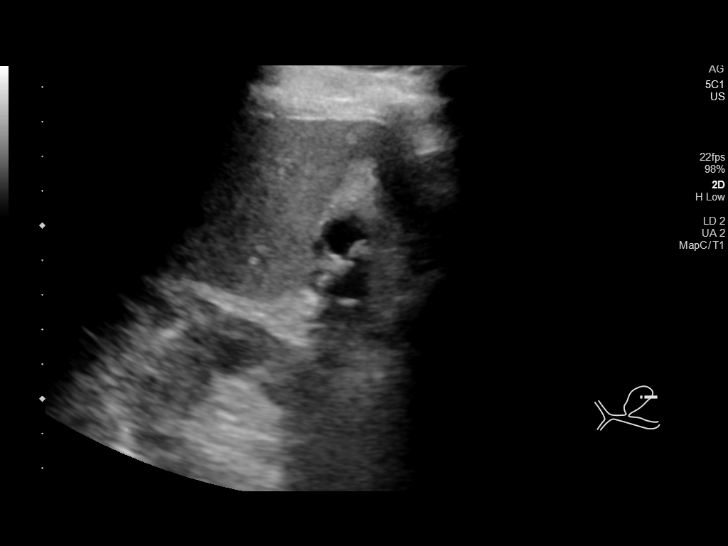
[im 13/35]
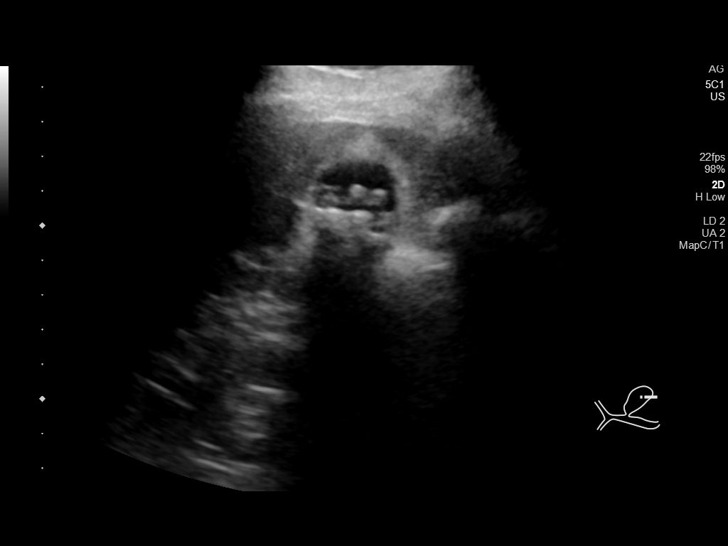
[im 16/35]
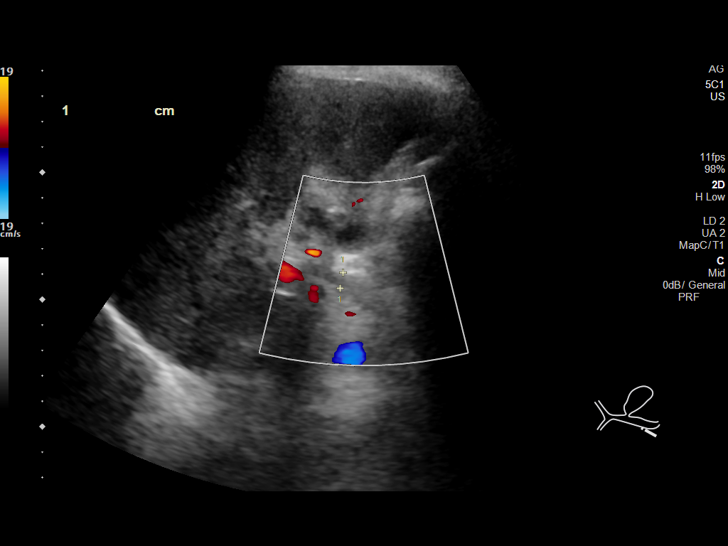
[im 19/35]
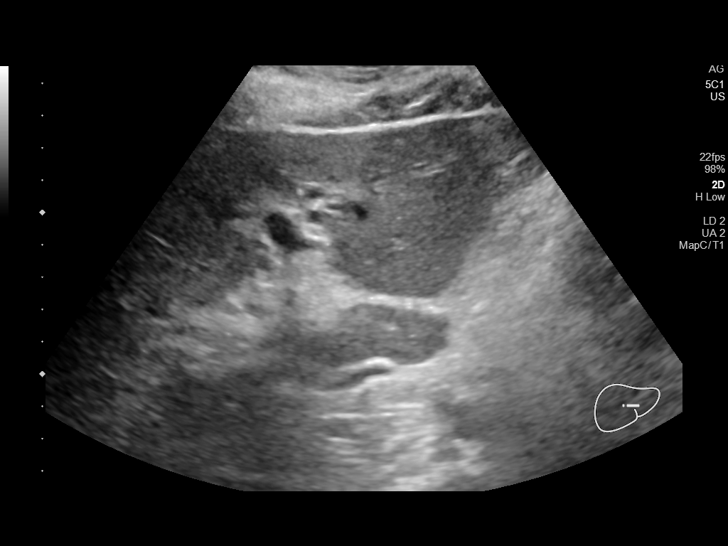
[im 22/35]
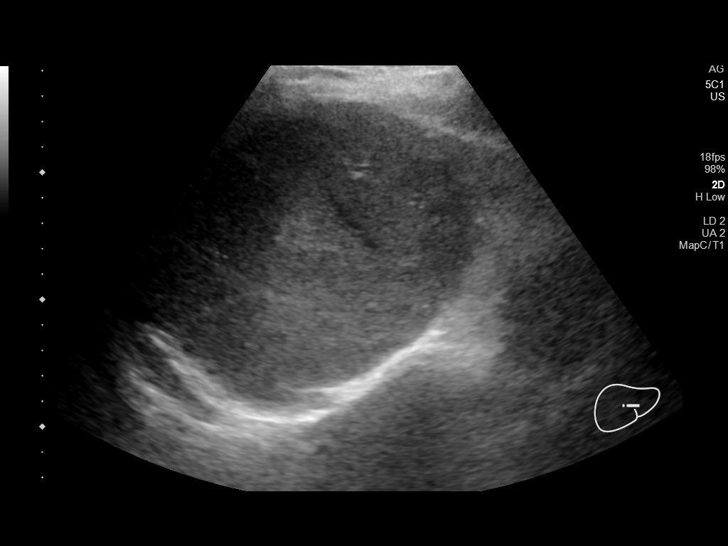
[im 23/35]
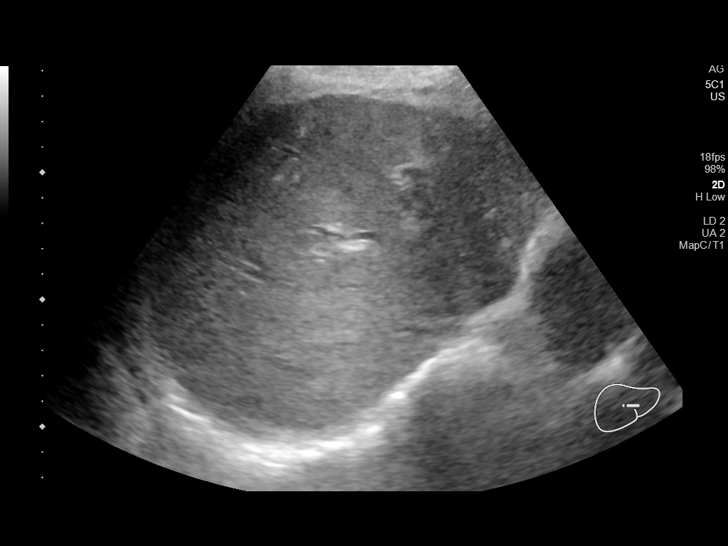
[im 26/35]
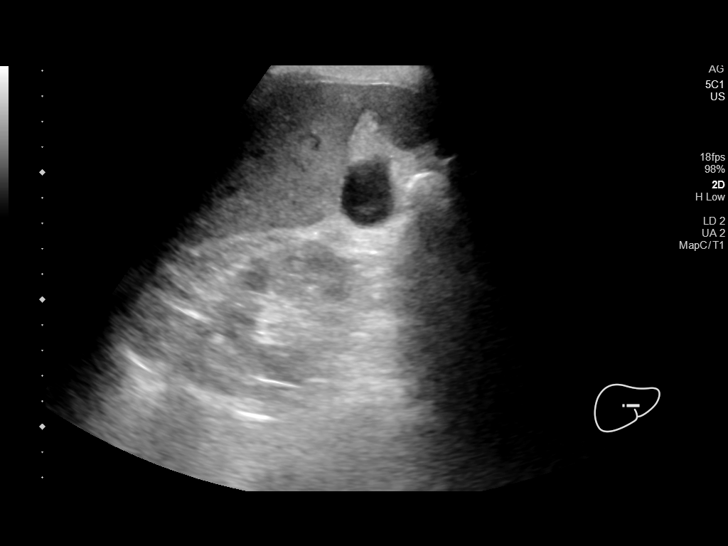
[im 29/35]
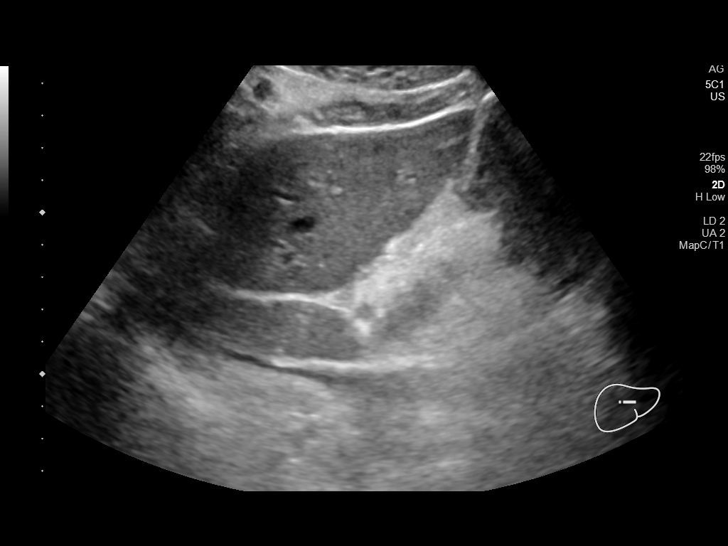
[im 32/35]
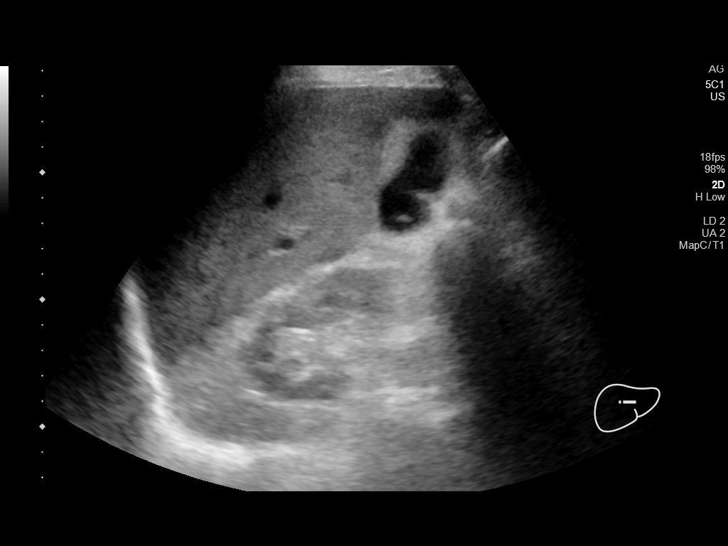
[im 35/35]
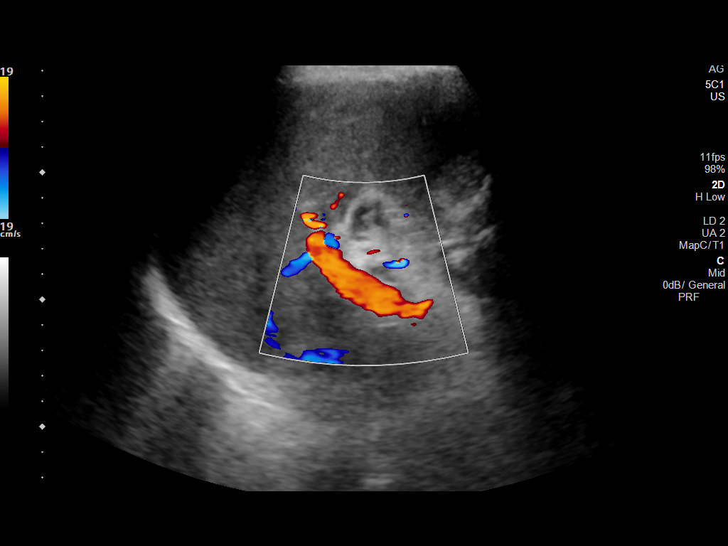

[14 of 25 positions shown; findings below may reference images not displayed]

FINDINGS: Gallbladder:

Multiple stones are seen in the gallbladder. No wall thickening,
pericholecystic fluid, or Murphy's sign.

Common bile duct:

Diameter: 6 mm

Liver:

No focal lesion identified. Within normal limits in parenchymal
echogenicity. Portal vein is patent on color Doppler imaging with
normal direction of blood flow towards the liver.

Other: None.
IMPRESSION: 1. Cholelithiasis without wall thickening, pericholecystic fluid, or
Murphy's sign. If the clinical picture remains ambiguous, recommend
HIDA scan.
2. The common bile duct is borderline measuring 6.3 mm. Recommend
correlation with clinical picture and labs. If there is concern for
biliary obstruction, recommend MRCP or ERCP.

## 2021-01-21 IMAGING — CR DG ABDOMEN ACUTE W/ 1V CHEST
4 series · 4 of 4 positions shown · non-contrast
Comparison: None.

CLINICAL DATA: Abdominal pain

EXAM:
DG ABDOMEN ACUTE WITH 1 VIEW CHEST

[x chest ap]
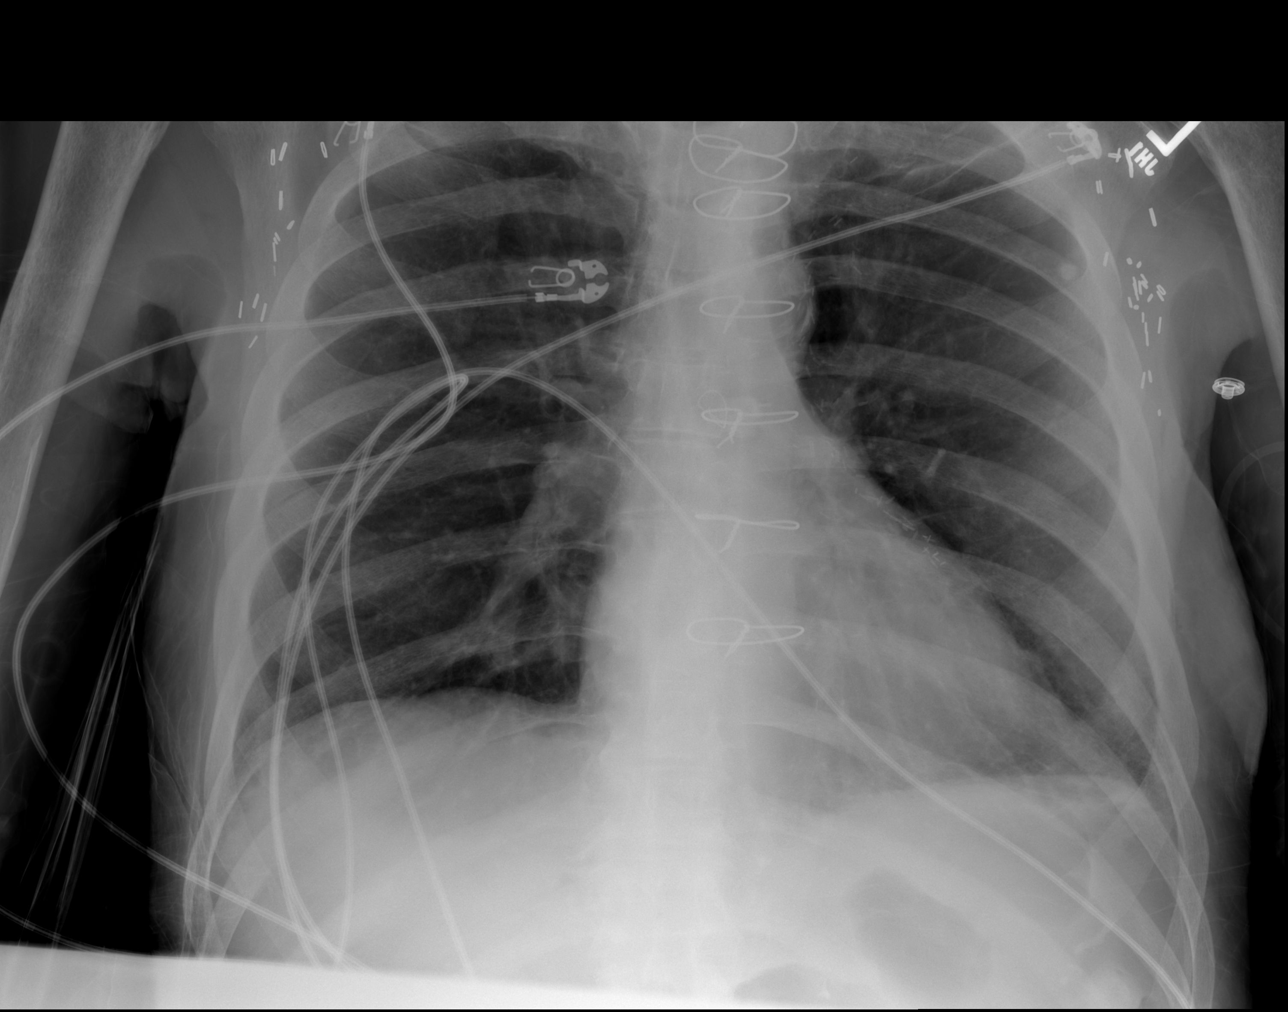

[x abdomen supine (1 of 3)]
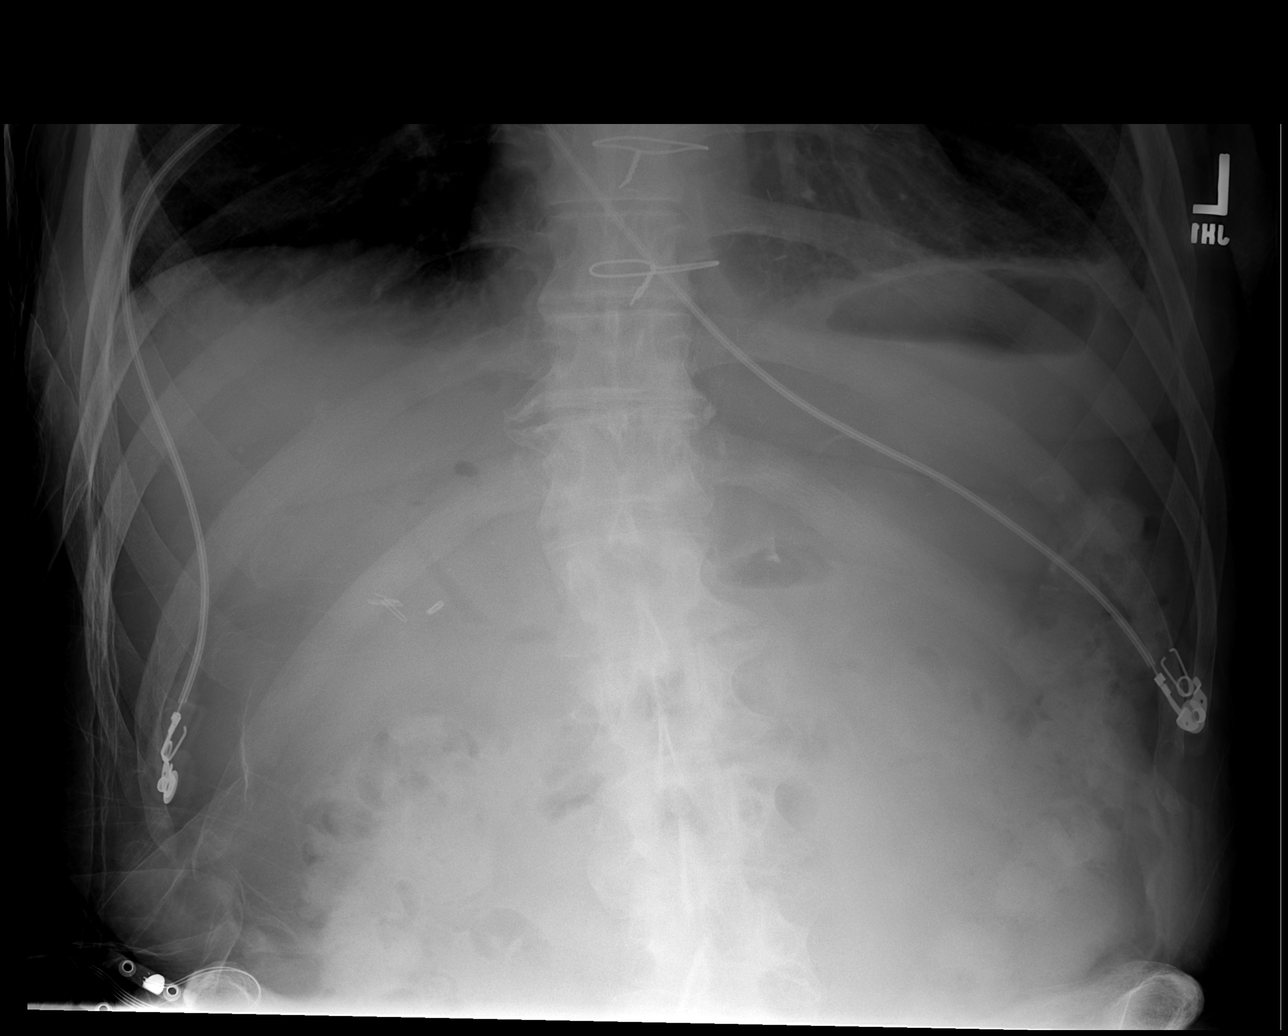

[x abdomen supine (2 of 3)]
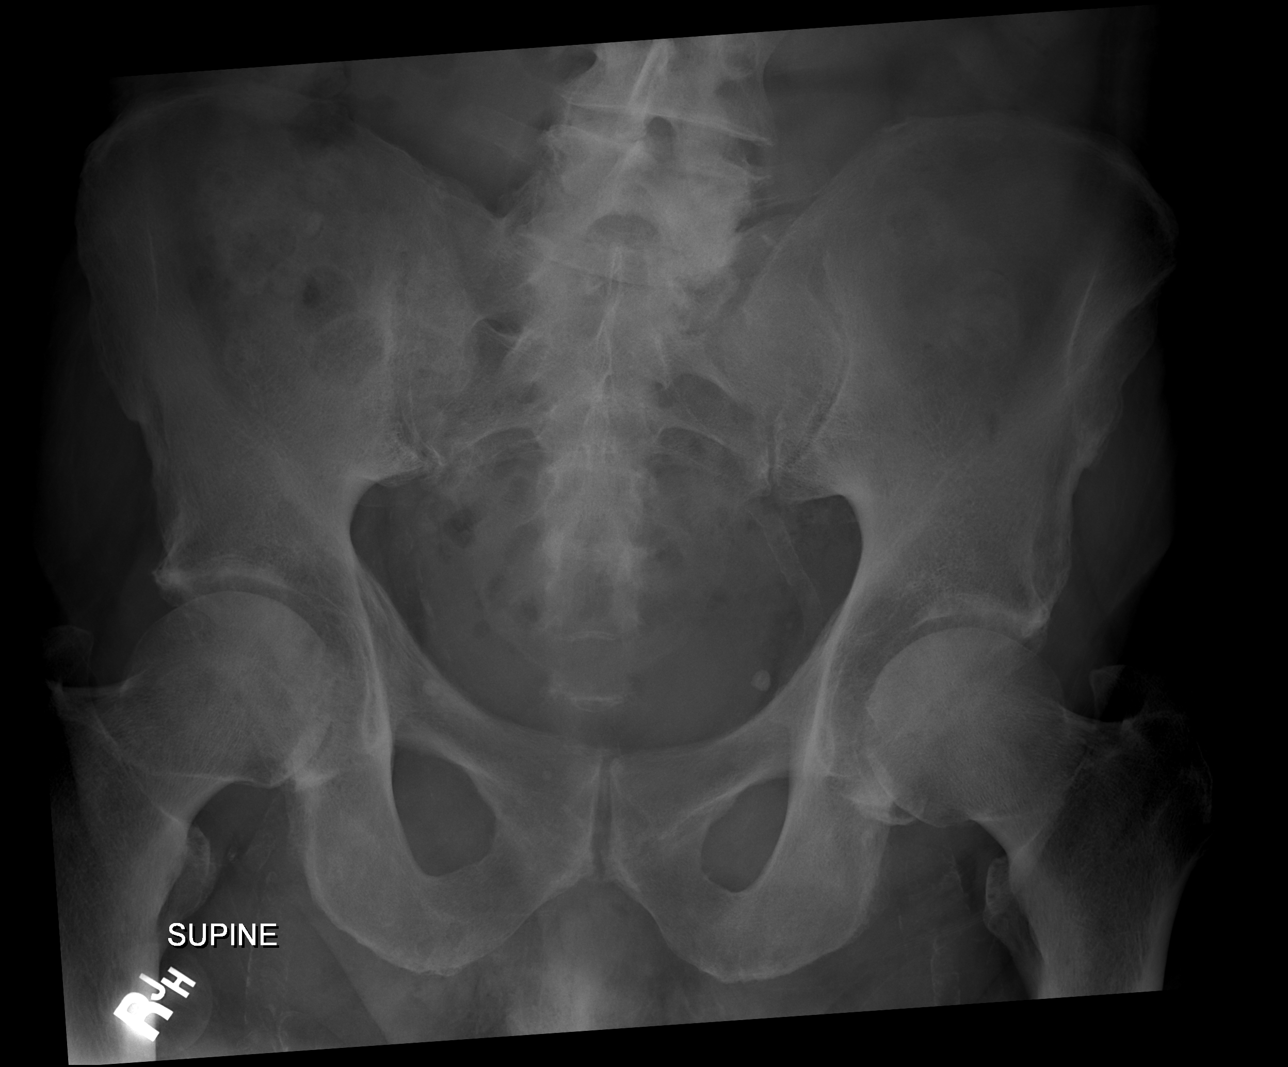

[x abdomen supine (3 of 3)]
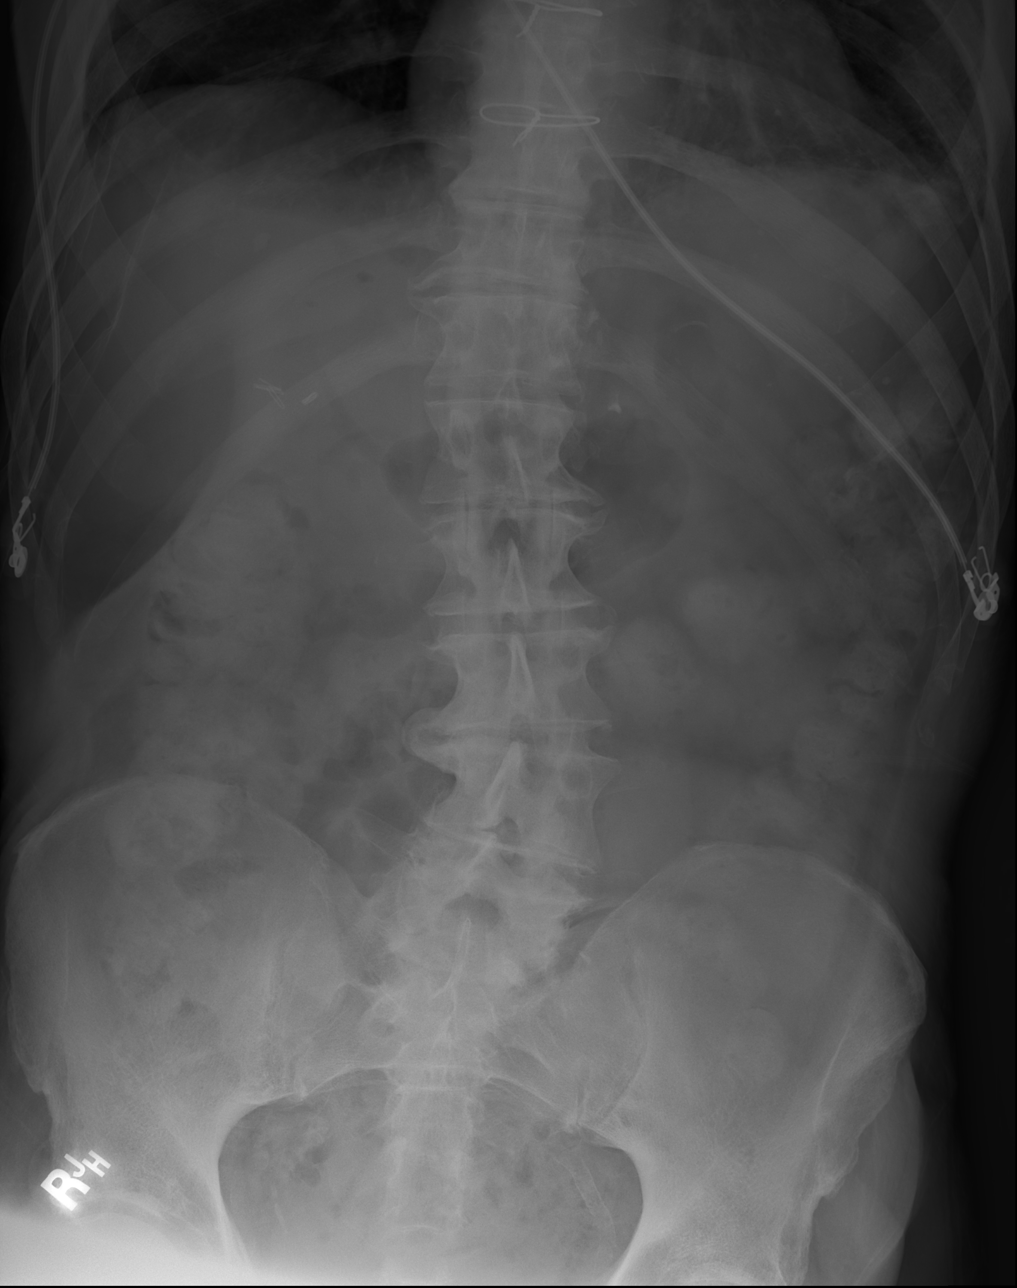

[4 of 4 positions shown; findings below may reference images not displayed]

FINDINGS: There is no evidence of dilated bowel loops or free intraperitoneal
air. No radiopaque calculi or other significant radiographic
abnormality is seen. Heart size and mediastinal contours are within
normal limits. Both lungs are clear.
IMPRESSION: Negative abdominal radiographs.  No acute cardiopulmonary disease.

## 2021-01-27 DIAGNOSIS — I130 Hypertensive heart and chronic kidney disease with heart failure and stage 1 through stage 4 chronic kidney disease, or unspecified chronic kidney disease: Secondary | ICD-10-CM | POA: Diagnosis not present

## 2021-01-27 DIAGNOSIS — I509 Heart failure, unspecified: Secondary | ICD-10-CM | POA: Diagnosis not present

## 2021-01-27 DIAGNOSIS — E785 Hyperlipidemia, unspecified: Secondary | ICD-10-CM | POA: Diagnosis not present

## 2021-01-27 DIAGNOSIS — N1831 Chronic kidney disease, stage 3a: Secondary | ICD-10-CM | POA: Diagnosis not present

## 2021-01-30 DIAGNOSIS — C44519 Basal cell carcinoma of skin of other part of trunk: Secondary | ICD-10-CM | POA: Diagnosis not present

## 2021-01-30 DIAGNOSIS — L853 Xerosis cutis: Secondary | ICD-10-CM | POA: Diagnosis not present

## 2021-01-30 DIAGNOSIS — Z85820 Personal history of malignant melanoma of skin: Secondary | ICD-10-CM | POA: Diagnosis not present

## 2021-01-30 DIAGNOSIS — L821 Other seborrheic keratosis: Secondary | ICD-10-CM | POA: Diagnosis not present

## 2021-01-30 DIAGNOSIS — Z20828 Contact with and (suspected) exposure to other viral communicable diseases: Secondary | ICD-10-CM | POA: Diagnosis not present

## 2021-01-30 DIAGNOSIS — D485 Neoplasm of uncertain behavior of skin: Secondary | ICD-10-CM | POA: Diagnosis not present

## 2021-01-30 DIAGNOSIS — L570 Actinic keratosis: Secondary | ICD-10-CM | POA: Diagnosis not present

## 2021-01-30 DIAGNOSIS — Z85828 Personal history of other malignant neoplasm of skin: Secondary | ICD-10-CM | POA: Diagnosis not present

## 2021-02-06 DIAGNOSIS — Z20828 Contact with and (suspected) exposure to other viral communicable diseases: Secondary | ICD-10-CM | POA: Diagnosis not present

## 2021-02-13 DIAGNOSIS — Z20828 Contact with and (suspected) exposure to other viral communicable diseases: Secondary | ICD-10-CM | POA: Diagnosis not present

## 2021-02-20 DIAGNOSIS — Z20828 Contact with and (suspected) exposure to other viral communicable diseases: Secondary | ICD-10-CM | POA: Diagnosis not present

## 2021-02-27 DIAGNOSIS — N1831 Chronic kidney disease, stage 3a: Secondary | ICD-10-CM | POA: Diagnosis not present

## 2021-02-27 DIAGNOSIS — I509 Heart failure, unspecified: Secondary | ICD-10-CM | POA: Diagnosis not present

## 2021-02-27 DIAGNOSIS — E785 Hyperlipidemia, unspecified: Secondary | ICD-10-CM | POA: Diagnosis not present

## 2021-02-27 DIAGNOSIS — Z8616 Personal history of COVID-19: Secondary | ICD-10-CM | POA: Diagnosis not present

## 2021-02-27 DIAGNOSIS — I130 Hypertensive heart and chronic kidney disease with heart failure and stage 1 through stage 4 chronic kidney disease, or unspecified chronic kidney disease: Secondary | ICD-10-CM | POA: Diagnosis not present

## 2021-03-06 DIAGNOSIS — Z20828 Contact with and (suspected) exposure to other viral communicable diseases: Secondary | ICD-10-CM | POA: Diagnosis not present

## 2021-03-10 DIAGNOSIS — Z23 Encounter for immunization: Secondary | ICD-10-CM | POA: Diagnosis not present

## 2021-03-13 DIAGNOSIS — Z20828 Contact with and (suspected) exposure to other viral communicable diseases: Secondary | ICD-10-CM | POA: Diagnosis not present

## 2021-03-20 DIAGNOSIS — Z20828 Contact with and (suspected) exposure to other viral communicable diseases: Secondary | ICD-10-CM | POA: Diagnosis not present

## 2021-03-27 DIAGNOSIS — Z1159 Encounter for screening for other viral diseases: Secondary | ICD-10-CM | POA: Diagnosis not present

## 2021-03-27 DIAGNOSIS — Z20828 Contact with and (suspected) exposure to other viral communicable diseases: Secondary | ICD-10-CM | POA: Diagnosis not present

## 2021-03-29 DIAGNOSIS — N1831 Chronic kidney disease, stage 3a: Secondary | ICD-10-CM | POA: Diagnosis not present

## 2021-03-29 DIAGNOSIS — I509 Heart failure, unspecified: Secondary | ICD-10-CM | POA: Diagnosis not present

## 2021-03-29 DIAGNOSIS — I130 Hypertensive heart and chronic kidney disease with heart failure and stage 1 through stage 4 chronic kidney disease, or unspecified chronic kidney disease: Secondary | ICD-10-CM | POA: Diagnosis not present

## 2021-03-29 DIAGNOSIS — E785 Hyperlipidemia, unspecified: Secondary | ICD-10-CM | POA: Diagnosis not present

## 2021-04-05 DIAGNOSIS — Z66 Do not resuscitate: Secondary | ICD-10-CM | POA: Diagnosis not present

## 2021-04-05 DIAGNOSIS — E441 Mild protein-calorie malnutrition: Secondary | ICD-10-CM | POA: Diagnosis not present

## 2021-04-05 DIAGNOSIS — Z9181 History of falling: Secondary | ICD-10-CM | POA: Diagnosis not present

## 2021-04-05 DIAGNOSIS — Z7982 Long term (current) use of aspirin: Secondary | ICD-10-CM | POA: Diagnosis not present

## 2021-04-05 DIAGNOSIS — Z681 Body mass index (BMI) 19 or less, adult: Secondary | ICD-10-CM | POA: Diagnosis not present

## 2021-04-05 DIAGNOSIS — I739 Peripheral vascular disease, unspecified: Secondary | ICD-10-CM | POA: Diagnosis not present

## 2021-04-05 DIAGNOSIS — Z9989 Dependence on other enabling machines and devices: Secondary | ICD-10-CM | POA: Diagnosis not present

## 2021-04-10 DIAGNOSIS — Z1159 Encounter for screening for other viral diseases: Secondary | ICD-10-CM | POA: Diagnosis not present

## 2021-04-10 DIAGNOSIS — Z20828 Contact with and (suspected) exposure to other viral communicable diseases: Secondary | ICD-10-CM | POA: Diagnosis not present

## 2021-04-12 DIAGNOSIS — Z1159 Encounter for screening for other viral diseases: Secondary | ICD-10-CM | POA: Diagnosis not present

## 2021-04-12 DIAGNOSIS — Z20828 Contact with and (suspected) exposure to other viral communicable diseases: Secondary | ICD-10-CM | POA: Diagnosis not present

## 2021-04-14 DIAGNOSIS — Z1159 Encounter for screening for other viral diseases: Secondary | ICD-10-CM | POA: Diagnosis not present

## 2021-04-14 DIAGNOSIS — Z20828 Contact with and (suspected) exposure to other viral communicable diseases: Secondary | ICD-10-CM | POA: Diagnosis not present

## 2021-04-17 DIAGNOSIS — Z1159 Encounter for screening for other viral diseases: Secondary | ICD-10-CM | POA: Diagnosis not present

## 2021-04-17 DIAGNOSIS — Z20828 Contact with and (suspected) exposure to other viral communicable diseases: Secondary | ICD-10-CM | POA: Diagnosis not present

## 2021-04-19 DIAGNOSIS — Z20828 Contact with and (suspected) exposure to other viral communicable diseases: Secondary | ICD-10-CM | POA: Diagnosis not present

## 2021-04-19 DIAGNOSIS — Z1159 Encounter for screening for other viral diseases: Secondary | ICD-10-CM | POA: Diagnosis not present

## 2021-05-01 DIAGNOSIS — Z20828 Contact with and (suspected) exposure to other viral communicable diseases: Secondary | ICD-10-CM | POA: Diagnosis not present

## 2021-05-10 DIAGNOSIS — Z20828 Contact with and (suspected) exposure to other viral communicable diseases: Secondary | ICD-10-CM | POA: Diagnosis not present

## 2021-05-10 DIAGNOSIS — Z1159 Encounter for screening for other viral diseases: Secondary | ICD-10-CM | POA: Diagnosis not present

## 2021-05-12 DIAGNOSIS — Z9049 Acquired absence of other specified parts of digestive tract: Secondary | ICD-10-CM | POA: Diagnosis not present

## 2021-05-12 DIAGNOSIS — K8689 Other specified diseases of pancreas: Secondary | ICD-10-CM | POA: Diagnosis not present

## 2021-05-12 DIAGNOSIS — K861 Other chronic pancreatitis: Secondary | ICD-10-CM | POA: Diagnosis not present

## 2021-05-16 ENCOUNTER — Other Ambulatory Visit: Payer: Self-pay

## 2021-05-16 MED ORDER — ISOSORBIDE MONONITRATE ER 30 MG PO TB24: 30.0000 mg | ORAL_TABLET | Freq: Every day | ORAL | 1 refills | Status: DC

## 2021-05-22 DIAGNOSIS — Z20822 Contact with and (suspected) exposure to covid-19: Secondary | ICD-10-CM | POA: Diagnosis not present

## 2021-05-29 DIAGNOSIS — Z20828 Contact with and (suspected) exposure to other viral communicable diseases: Secondary | ICD-10-CM | POA: Diagnosis not present

## 2021-06-20 DIAGNOSIS — H353131 Nonexudative age-related macular degeneration, bilateral, early dry stage: Secondary | ICD-10-CM | POA: Diagnosis not present

## 2021-06-20 DIAGNOSIS — H52203 Unspecified astigmatism, bilateral: Secondary | ICD-10-CM | POA: Diagnosis not present

## 2021-06-20 DIAGNOSIS — Z961 Presence of intraocular lens: Secondary | ICD-10-CM | POA: Diagnosis not present

## 2021-06-28 DIAGNOSIS — N1831 Chronic kidney disease, stage 3a: Secondary | ICD-10-CM | POA: Diagnosis not present

## 2021-06-28 DIAGNOSIS — K8689 Other specified diseases of pancreas: Secondary | ICD-10-CM | POA: Diagnosis not present

## 2021-06-28 DIAGNOSIS — E785 Hyperlipidemia, unspecified: Secondary | ICD-10-CM | POA: Diagnosis not present

## 2021-06-28 DIAGNOSIS — I130 Hypertensive heart and chronic kidney disease with heart failure and stage 1 through stage 4 chronic kidney disease, or unspecified chronic kidney disease: Secondary | ICD-10-CM | POA: Diagnosis not present

## 2021-06-28 DIAGNOSIS — D649 Anemia, unspecified: Secondary | ICD-10-CM | POA: Diagnosis not present

## 2021-06-28 DIAGNOSIS — E039 Hypothyroidism, unspecified: Secondary | ICD-10-CM | POA: Diagnosis not present

## 2021-06-28 DIAGNOSIS — I509 Heart failure, unspecified: Secondary | ICD-10-CM | POA: Diagnosis not present

## 2021-06-28 DIAGNOSIS — M109 Gout, unspecified: Secondary | ICD-10-CM | POA: Diagnosis not present

## 2021-06-28 DIAGNOSIS — G629 Polyneuropathy, unspecified: Secondary | ICD-10-CM | POA: Diagnosis not present

## 2021-06-28 DIAGNOSIS — I493 Ventricular premature depolarization: Secondary | ICD-10-CM | POA: Diagnosis not present

## 2021-06-28 DIAGNOSIS — D509 Iron deficiency anemia, unspecified: Secondary | ICD-10-CM | POA: Diagnosis not present

## 2021-06-28 DIAGNOSIS — I2510 Atherosclerotic heart disease of native coronary artery without angina pectoris: Secondary | ICD-10-CM | POA: Diagnosis not present

## 2021-07-05 DIAGNOSIS — R7301 Impaired fasting glucose: Secondary | ICD-10-CM | POA: Diagnosis not present

## 2021-07-05 DIAGNOSIS — E785 Hyperlipidemia, unspecified: Secondary | ICD-10-CM | POA: Diagnosis not present

## 2021-07-05 DIAGNOSIS — I509 Heart failure, unspecified: Secondary | ICD-10-CM | POA: Diagnosis not present

## 2021-07-05 DIAGNOSIS — D649 Anemia, unspecified: Secondary | ICD-10-CM | POA: Diagnosis not present

## 2021-07-05 DIAGNOSIS — E039 Hypothyroidism, unspecified: Secondary | ICD-10-CM | POA: Diagnosis not present

## 2021-07-07 DIAGNOSIS — E785 Hyperlipidemia, unspecified: Secondary | ICD-10-CM | POA: Diagnosis not present

## 2021-07-07 DIAGNOSIS — E039 Hypothyroidism, unspecified: Secondary | ICD-10-CM | POA: Diagnosis not present

## 2021-07-31 DIAGNOSIS — L821 Other seborrheic keratosis: Secondary | ICD-10-CM | POA: Diagnosis not present

## 2021-07-31 DIAGNOSIS — L570 Actinic keratosis: Secondary | ICD-10-CM | POA: Diagnosis not present

## 2021-07-31 DIAGNOSIS — Z85828 Personal history of other malignant neoplasm of skin: Secondary | ICD-10-CM | POA: Diagnosis not present

## 2021-07-31 DIAGNOSIS — Z85820 Personal history of malignant melanoma of skin: Secondary | ICD-10-CM | POA: Diagnosis not present

## 2021-09-05 DIAGNOSIS — Z9049 Acquired absence of other specified parts of digestive tract: Secondary | ICD-10-CM | POA: Diagnosis not present

## 2021-09-05 DIAGNOSIS — E441 Mild protein-calorie malnutrition: Secondary | ICD-10-CM | POA: Diagnosis not present

## 2021-09-05 DIAGNOSIS — K8689 Other specified diseases of pancreas: Secondary | ICD-10-CM | POA: Diagnosis not present

## 2021-09-05 DIAGNOSIS — Z515 Encounter for palliative care: Secondary | ICD-10-CM | POA: Diagnosis not present

## 2021-09-05 DIAGNOSIS — K861 Other chronic pancreatitis: Secondary | ICD-10-CM | POA: Diagnosis not present

## 2021-09-05 DIAGNOSIS — Z66 Do not resuscitate: Secondary | ICD-10-CM | POA: Diagnosis not present

## 2021-09-05 DIAGNOSIS — Z681 Body mass index (BMI) 19 or less, adult: Secondary | ICD-10-CM | POA: Diagnosis not present

## 2021-11-03 ENCOUNTER — Other Ambulatory Visit: Payer: Self-pay

## 2021-11-03 MED ORDER — ISOSORBIDE MONONITRATE ER 30 MG PO TB24: 30.0000 mg | ORAL_TABLET | Freq: Every day | ORAL | 0 refills | Status: DC

## 2021-11-28 ENCOUNTER — Encounter: Payer: Self-pay | Admitting: Internal Medicine

## 2021-11-28 ENCOUNTER — Ambulatory Visit: Payer: PPO | Admitting: Internal Medicine

## 2021-11-28 VITALS — BP 134/58 | HR 79 | Ht 72.0 in | Wt 150.0 lb

## 2021-11-28 DIAGNOSIS — I2510 Atherosclerotic heart disease of native coronary artery without angina pectoris: Secondary | ICD-10-CM

## 2021-11-28 DIAGNOSIS — I493 Ventricular premature depolarization: Secondary | ICD-10-CM

## 2021-11-28 NOTE — Progress Notes (Signed)
HPI [name] returns for followup of his symptomatic PVC's and PVC induced CM. He has undergone cholecystectomy complicated by C.Diff which took months to improve. He has not had more falls. He is using a walker. He remains off of the amiodarone.  No PVC"s. He feels tired and he is sleeping more. He had some neuropathic pain which has been treated by Gabapentin in low dose at bedtime and his fatigue and tiredness last until lunch. He often takes a morning nap.  Allergies  Allergen Reactions   Atenolol Diarrhea and Other (See Comments)    Very weak, fatigued, lethargic, no appetite   Ramipril Other (See Comments)   Statins Other (See Comments)     Current Outpatient Medications  Medication Sig Dispense Refill   aspirin 81 MG tablet Take 81 mg by mouth daily.     CREON 36000-114000 units CPEP capsule Take by mouth.     Evolocumab (REPATHA SURECLICK) 144 MG/ML SOAJ Inject 140 mg into the skin every 28 (twenty-eight) days.     ezetimibe (ZETIA) 10 MG tablet Take 10 mg by mouth daily.     ferrous sulfate 325 (65 FE) MG tablet Take 1 tablet (325 mg total) by mouth daily. 30 tablet 3   gabapentin (NEURONTIN) 100 MG capsule Take 100 mg by mouth at bedtime.     isosorbide mononitrate (IMDUR) 30 MG 24 hr tablet Take 1 tablet (30 mg total) by mouth daily. 30 tablet 0   levothyroxine (SYNTHROID, LEVOTHROID) 125 MCG tablet Take 1 tablet by mouth daily before breakfast.     mirtazapine (REMERON) 15 MG tablet Take 15 mg by mouth at bedtime.     nitroGLYCERIN (NITROSTAT) 0.4 MG SL tablet Place 0.4 mg under the tongue every 5 (five) minutes as needed for chest pain.     No current facility-administered medications for this visit.     Past Medical History:  Diagnosis Date   Anemia    Arthritis    "touch in the fingers" (11/17/2014)   Chronic combined systolic and diastolic CHF (congestive heart failure) ([facility])    a. 11/17/14 showed EF 25-30%, suboptimal image quality, diffuse hypokinesis worse  in the inferior wall, mild to moderate MR. New drop EF compared to 2012 but cath was stable.   CKD (chronic kidney disease), stage III (HCC)    Coronary artery disease    a. s/p 2v CABG (seq LIMA to Diag and LAD, SVG to OM) on 05/15/2001.   Fibromyalgia    "a long time ago" (11/17/2014)   History of gout    Hyperlipidemia    Hypothyroidism    LBBB (left bundle branch block)    Melanoma of back (HCC)    NSVT (nonsustained ventricular tachycardia) (HCC)    PMR (polymyalgia rheumatica) (HCC)    PVC's (premature ventricular contractions)    Sleep apnea    "wife says I do" (11/18/2014)    ROS:   All systems reviewed and negative except as noted in the HPI.   Past Surgical History:  Procedure Laterality Date   AXILLARY LYMPH NODE DISSECTION Bilateral 2001   CARDIAC CATHETERIZATION  04/28/2001   CARDIAC CATHETERIZATION N/A 11/19/2014   Procedure: Right/Left Heart Cath and Coronary/Graft Angiography;  Surgeon: Jettie Booze, MD;  Location: [facility];  Service: Cardiovascular;  Laterality: N/A;   CATARACT EXTRACTION W/ INTRAOCULAR LENS  IMPLANT, BILATERAL Bilateral ~ 2010   CHOLECYSTECTOMY N/A 05/10/2020   Procedure: LAPAROSCOPIC CHOLECYSTECTOMY WITH INTRAOPERATIVE CHOLANGIOGRAM;  Surgeon:  Coralie Keens, MD;  Location: [facility];  Service: General;  Laterality: N/A;   CORONARY ARTERY BYPASS GRAFT  05/15/2001   x3 sequential LIMA to diag and LAD, SVG to OM    ERCP N/A 05/11/2020   Procedure: ENDOSCOPIC RETROGRADE CHOLANGIOPANCREATOGRAPHY (ERCP);  Surgeon: Clarene Essex, MD;  Location: [facility];  Service: Endoscopy;  Laterality: N/A;   MELANOMA EXCISION  2001   off of back   REMOVAL OF STONES  05/11/2020   Procedure: REMOVAL OF STONES;  Surgeon: Clarene Essex, MD;  Location: [facility];  Service: Endoscopy;;   SPHINCTEROTOMY  05/11/2020   Procedure: Joan Mayans;  Surgeon: Clarene Essex, MD;  Location: [facility];  Service: Endoscopy;;     Family History  Problem  Relation Age of Onset   Prostate cancer Father    Liver cancer Mother    Hyperlipidemia Son    Hyperlipidemia Daughter      Social History   Socioeconomic History   Marital status: Married    Spouse name: Not on file   Number of children: Not on file   Years of education: Not on file   Highest education level: Not on file  Occupational History   Not on file  Tobacco Use   Smoking status: Never   Smokeless tobacco: Never  Substance and Sexual Activity   Alcohol use: Yes    Alcohol/week: 6.0 standard drinks of alcohol    Types: 3 Glasses of wine, 3 Cans of beer per week   Drug use: No   Sexual activity: Never  Other Topics Concern   Not on file  Social History Narrative   Not on file   Social Determinants of Health   Financial Resource Strain: Not on file  Food Insecurity: Not on file  Transportation Needs: Not on file  Physical Activity: Not on file  Stress: Not on file  Social Connections: Not on file  Intimate Partner Violence: Not on file     BP (!) 134/58   Pulse 79   Ht 6' (1.829 m)   Wt 150 lb (68 kg)   SpO2 98%   BMI 20.34 kg/m   Physical Exam:  Well appearing NAD HEENT: Unremarkable Neck:  No JVD, no thyromegally Lymphatics:  No adenopathy Back:  No CVA tenderness Lungs:  Clear with no wheezes HEART:  Regular rate rhythm, no murmurs, no rubs, no clicks Abd:  soft, positive bowel sounds, no organomegally, no rebound, no guarding Ext:  2 plus pulses, no edema, no cyanosis, no clubbing Skin:  No rashes no nodules Neuro:  CN II through XII intact, motor grossly intact  EKG - nsr with no PVC's   Assess/Plan:  1. PVC - he is stable and has not had any additional symptoms. He will remain off of amiodarone for now.   2. PVC induced CM - his EF had normalized at last echo. He will continue his current meds.   3.  Coronary artery disease -despite his advanced age, he has no anginal symptoms. 4. Weight loss - this appears to have stabilized.  5.  Morning somnolence and fatigue/weakness - He feels less pain with taking the gabapentin but is a bit sleepier. I asked him to speak to Dr. Joylene Draft about this.    Royston Sinner Shirin Echeverry,MD

## 2021-11-28 NOTE — Patient Instructions (Addendum)
Medication Instructions:  Your physician recommends that you continue on your current medications as directed. Please refer to the Current Medication list given to you today.  *If you need a refill on your cardiac medications before your next appointment, please call your pharmacy*  Lab Work: None ordered.  If you have labs (blood work) drawn today and your tests are completely normal, you will receive your results only by: [place] (if you have MyChart) OR A paper copy in the mail If you have any lab test that is abnormal or we need to change your treatment, we will call you to review the results.  Testing/Procedures: None ordered.  Follow-Up: At [facility], you and your health needs are our priority.  As part of our continuing mission to provide you with exceptional heart care, we have created designated Provider Care Teams.  These Care Teams include your primary Cardiologist (physician) and Advanced Practice Providers (APPs -  Physician Assistants and Nurse Practitioners) who all work together to provide you with the care you need, when you need it.  We recommend signing up for the patient portal called "MyChart".  Sign up information is provided on this After Visit Summary.  MyChart is used to connect with patients for Virtual Visits (Telemedicine).  Patients are able to view lab/test results, encounter notes, upcoming appointments, etc.  Non-urgent messages can be sent to your provider as well.   To learn more about what you can do with MyChart, go to [email or web address].   Contact [facility] with any changes in symptoms.   Your next appointment:   6 Month   The format for your next appointment:   In Person  Provider:   Cristopher Peru, MD{or one of the following Advanced Practice Providers on your designated Care Team:   Tommye Standard, [place] Legrand Como "Jonni Sanger" Chalmers Cater, [place]   Important Information About Sugar

## 2021-11-30 DIAGNOSIS — N1831 Chronic kidney disease, stage 3a: Secondary | ICD-10-CM | POA: Diagnosis not present

## 2021-11-30 DIAGNOSIS — G619 Inflammatory polyneuropathy, unspecified: Secondary | ICD-10-CM | POA: Diagnosis not present

## 2021-11-30 DIAGNOSIS — Z66 Do not resuscitate: Secondary | ICD-10-CM | POA: Diagnosis not present

## 2021-11-30 DIAGNOSIS — Z7982 Long term (current) use of aspirin: Secondary | ICD-10-CM | POA: Diagnosis not present

## 2021-11-30 DIAGNOSIS — Z515 Encounter for palliative care: Secondary | ICD-10-CM | POA: Diagnosis not present

## 2021-11-30 DIAGNOSIS — I739 Peripheral vascular disease, unspecified: Secondary | ICD-10-CM | POA: Diagnosis not present

## 2021-12-04 ENCOUNTER — Other Ambulatory Visit: Payer: Self-pay | Admitting: *Deleted

## 2021-12-04 MED ORDER — ISOSORBIDE MONONITRATE ER 30 MG PO TB24: 30.0000 mg | ORAL_TABLET | Freq: Every day | ORAL | 0 refills | Status: DC

## 2021-12-05 ENCOUNTER — Other Ambulatory Visit: Payer: Self-pay

## 2021-12-05 MED ORDER — ISOSORBIDE MONONITRATE ER 30 MG PO TB24: 30.0000 mg | ORAL_TABLET | Freq: Every day | ORAL | 3 refills | Status: DC

## 2021-12-05 NOTE — Telephone Encounter (Signed)
Pt's medication was sent to pt's pharmacy as requested. Confirmation received.  °

## 2021-12-26 DIAGNOSIS — E039 Hypothyroidism, unspecified: Secondary | ICD-10-CM | POA: Diagnosis not present

## 2021-12-26 DIAGNOSIS — R7989 Other specified abnormal findings of blood chemistry: Secondary | ICD-10-CM | POA: Diagnosis not present

## 2021-12-26 DIAGNOSIS — R7301 Impaired fasting glucose: Secondary | ICD-10-CM | POA: Diagnosis not present

## 2021-12-26 DIAGNOSIS — E291 Testicular hypofunction: Secondary | ICD-10-CM | POA: Diagnosis not present

## 2021-12-26 DIAGNOSIS — Z125 Encounter for screening for malignant neoplasm of prostate: Secondary | ICD-10-CM | POA: Diagnosis not present

## 2021-12-26 DIAGNOSIS — E785 Hyperlipidemia, unspecified: Secondary | ICD-10-CM | POA: Diagnosis not present

## 2021-12-26 DIAGNOSIS — D649 Anemia, unspecified: Secondary | ICD-10-CM | POA: Diagnosis not present

## 2021-12-26 DIAGNOSIS — M109 Gout, unspecified: Secondary | ICD-10-CM | POA: Diagnosis not present

## 2022-01-02 DIAGNOSIS — I509 Heart failure, unspecified: Secondary | ICD-10-CM | POA: Diagnosis not present

## 2022-01-02 DIAGNOSIS — R7301 Impaired fasting glucose: Secondary | ICD-10-CM | POA: Diagnosis not present

## 2022-01-02 DIAGNOSIS — I25119 Atherosclerotic heart disease of native coronary artery with unspecified angina pectoris: Secondary | ICD-10-CM | POA: Diagnosis not present

## 2022-01-02 DIAGNOSIS — G629 Polyneuropathy, unspecified: Secondary | ICD-10-CM | POA: Diagnosis not present

## 2022-01-02 DIAGNOSIS — E785 Hyperlipidemia, unspecified: Secondary | ICD-10-CM | POA: Diagnosis not present

## 2022-01-02 DIAGNOSIS — Z1339 Encounter for screening examination for other mental health and behavioral disorders: Secondary | ICD-10-CM | POA: Diagnosis not present

## 2022-01-02 DIAGNOSIS — I6529 Occlusion and stenosis of unspecified carotid artery: Secondary | ICD-10-CM | POA: Diagnosis not present

## 2022-01-02 DIAGNOSIS — I2510 Atherosclerotic heart disease of native coronary artery without angina pectoris: Secondary | ICD-10-CM | POA: Diagnosis not present

## 2022-01-02 DIAGNOSIS — Z0000 Encounter for general adult medical examination without abnormal findings: Secondary | ICD-10-CM | POA: Diagnosis not present

## 2022-01-02 DIAGNOSIS — I130 Hypertensive heart and chronic kidney disease with heart failure and stage 1 through stage 4 chronic kidney disease, or unspecified chronic kidney disease: Secondary | ICD-10-CM | POA: Diagnosis not present

## 2022-01-02 DIAGNOSIS — N1831 Chronic kidney disease, stage 3a: Secondary | ICD-10-CM | POA: Diagnosis not present

## 2022-01-02 DIAGNOSIS — R269 Unspecified abnormalities of gait and mobility: Secondary | ICD-10-CM | POA: Diagnosis not present

## 2022-01-02 DIAGNOSIS — Z1331 Encounter for screening for depression: Secondary | ICD-10-CM | POA: Diagnosis not present

## 2022-01-02 DIAGNOSIS — R82998 Other abnormal findings in urine: Secondary | ICD-10-CM | POA: Diagnosis not present

## 2022-01-02 DIAGNOSIS — K8689 Other specified diseases of pancreas: Secondary | ICD-10-CM | POA: Diagnosis not present

## 2022-01-30 DIAGNOSIS — L570 Actinic keratosis: Secondary | ICD-10-CM | POA: Diagnosis not present

## 2022-01-30 DIAGNOSIS — L821 Other seborrheic keratosis: Secondary | ICD-10-CM | POA: Diagnosis not present

## 2022-01-30 DIAGNOSIS — L820 Inflamed seborrheic keratosis: Secondary | ICD-10-CM | POA: Diagnosis not present

## 2022-01-30 DIAGNOSIS — D485 Neoplasm of uncertain behavior of skin: Secondary | ICD-10-CM | POA: Diagnosis not present

## 2022-01-30 DIAGNOSIS — Z85820 Personal history of malignant melanoma of skin: Secondary | ICD-10-CM | POA: Diagnosis not present

## 2022-01-30 DIAGNOSIS — Z85828 Personal history of other malignant neoplasm of skin: Secondary | ICD-10-CM | POA: Diagnosis not present

## 2022-01-30 DIAGNOSIS — L84 Corns and callosities: Secondary | ICD-10-CM | POA: Diagnosis not present

## 2022-01-30 DIAGNOSIS — L812 Freckles: Secondary | ICD-10-CM | POA: Diagnosis not present

## 2022-01-30 DIAGNOSIS — D0359 Melanoma in situ of other part of trunk: Secondary | ICD-10-CM | POA: Diagnosis not present

## 2022-02-06 DIAGNOSIS — J22 Unspecified acute lower respiratory infection: Secondary | ICD-10-CM | POA: Diagnosis not present

## 2022-02-08 DIAGNOSIS — R5383 Other fatigue: Secondary | ICD-10-CM | POA: Diagnosis not present

## 2022-02-08 DIAGNOSIS — R509 Fever, unspecified: Secondary | ICD-10-CM | POA: Diagnosis not present

## 2022-02-08 DIAGNOSIS — D72829 Elevated white blood cell count, unspecified: Secondary | ICD-10-CM | POA: Diagnosis not present

## 2022-02-08 DIAGNOSIS — R638 Other symptoms and signs concerning food and fluid intake: Secondary | ICD-10-CM | POA: Diagnosis not present

## 2022-02-08 DIAGNOSIS — Z1152 Encounter for screening for COVID-19: Secondary | ICD-10-CM | POA: Diagnosis not present

## 2022-02-08 DIAGNOSIS — R531 Weakness: Secondary | ICD-10-CM | POA: Diagnosis not present

## 2022-02-08 DIAGNOSIS — R058 Other specified cough: Secondary | ICD-10-CM | POA: Diagnosis not present

## 2022-02-14 DIAGNOSIS — J22 Unspecified acute lower respiratory infection: Secondary | ICD-10-CM | POA: Diagnosis not present

## 2022-02-15 DIAGNOSIS — Z9849 Cataract extraction status, unspecified eye: Secondary | ICD-10-CM | POA: Diagnosis not present

## 2022-02-15 DIAGNOSIS — Z7982 Long term (current) use of aspirin: Secondary | ICD-10-CM | POA: Diagnosis not present

## 2022-02-15 DIAGNOSIS — K8689 Other specified diseases of pancreas: Secondary | ICD-10-CM | POA: Diagnosis not present

## 2022-02-15 DIAGNOSIS — E039 Hypothyroidism, unspecified: Secondary | ICD-10-CM | POA: Diagnosis not present

## 2022-02-15 DIAGNOSIS — E785 Hyperlipidemia, unspecified: Secondary | ICD-10-CM | POA: Diagnosis not present

## 2022-02-15 DIAGNOSIS — F3342 Major depressive disorder, recurrent, in full remission: Secondary | ICD-10-CM | POA: Diagnosis not present

## 2022-02-15 DIAGNOSIS — J069 Acute upper respiratory infection, unspecified: Secondary | ICD-10-CM | POA: Diagnosis not present

## 2022-02-15 DIAGNOSIS — I10 Essential (primary) hypertension: Secondary | ICD-10-CM | POA: Diagnosis not present

## 2022-02-17 DIAGNOSIS — Z23 Encounter for immunization: Secondary | ICD-10-CM | POA: Diagnosis not present

## 2022-02-19 DIAGNOSIS — Z09 Encounter for follow-up examination after completed treatment for conditions other than malignant neoplasm: Secondary | ICD-10-CM | POA: Diagnosis not present

## 2022-02-19 DIAGNOSIS — Z8709 Personal history of other diseases of the respiratory system: Secondary | ICD-10-CM | POA: Diagnosis not present

## 2022-02-28 DIAGNOSIS — Z85820 Personal history of malignant melanoma of skin: Secondary | ICD-10-CM | POA: Diagnosis not present

## 2022-02-28 DIAGNOSIS — D0359 Melanoma in situ of other part of trunk: Secondary | ICD-10-CM | POA: Diagnosis not present

## 2022-02-28 DIAGNOSIS — Z85828 Personal history of other malignant neoplasm of skin: Secondary | ICD-10-CM | POA: Diagnosis not present

## 2022-03-29 DIAGNOSIS — Z681 Body mass index (BMI) 19 or less, adult: Secondary | ICD-10-CM | POA: Diagnosis not present

## 2022-03-29 DIAGNOSIS — Z66 Do not resuscitate: Secondary | ICD-10-CM | POA: Diagnosis not present

## 2022-03-29 DIAGNOSIS — N183 Chronic kidney disease, stage 3 unspecified: Secondary | ICD-10-CM | POA: Diagnosis not present

## 2022-03-29 DIAGNOSIS — Z515 Encounter for palliative care: Secondary | ICD-10-CM | POA: Diagnosis not present

## 2022-03-29 DIAGNOSIS — R296 Repeated falls: Secondary | ICD-10-CM | POA: Diagnosis not present

## 2022-03-29 DIAGNOSIS — N2581 Secondary hyperparathyroidism of renal origin: Secondary | ICD-10-CM | POA: Diagnosis not present

## 2022-03-29 DIAGNOSIS — E441 Mild protein-calorie malnutrition: Secondary | ICD-10-CM | POA: Diagnosis not present

## 2022-03-29 DIAGNOSIS — I479 Paroxysmal tachycardia, unspecified: Secondary | ICD-10-CM | POA: Diagnosis not present

## 2022-06-26 DIAGNOSIS — H52203 Unspecified astigmatism, bilateral: Secondary | ICD-10-CM | POA: Diagnosis not present

## 2022-06-26 DIAGNOSIS — H353131 Nonexudative age-related macular degeneration, bilateral, early dry stage: Secondary | ICD-10-CM | POA: Diagnosis not present

## 2022-06-26 DIAGNOSIS — Z961 Presence of intraocular lens: Secondary | ICD-10-CM | POA: Diagnosis not present

## 2022-07-02 DIAGNOSIS — N183 Chronic kidney disease, stage 3 unspecified: Secondary | ICD-10-CM | POA: Diagnosis not present

## 2022-07-02 DIAGNOSIS — Z515 Encounter for palliative care: Secondary | ICD-10-CM | POA: Diagnosis not present

## 2022-07-02 DIAGNOSIS — Z6820 Body mass index (BMI) 20.0-20.9, adult: Secondary | ICD-10-CM | POA: Diagnosis not present

## 2022-07-02 DIAGNOSIS — I509 Heart failure, unspecified: Secondary | ICD-10-CM | POA: Diagnosis not present

## 2022-08-01 DIAGNOSIS — L812 Freckles: Secondary | ICD-10-CM | POA: Diagnosis not present

## 2022-08-01 DIAGNOSIS — Z85820 Personal history of malignant melanoma of skin: Secondary | ICD-10-CM | POA: Diagnosis not present

## 2022-08-01 DIAGNOSIS — L821 Other seborrheic keratosis: Secondary | ICD-10-CM | POA: Diagnosis not present

## 2022-08-01 DIAGNOSIS — L570 Actinic keratosis: Secondary | ICD-10-CM | POA: Diagnosis not present

## 2022-08-01 DIAGNOSIS — D1801 Hemangioma of skin and subcutaneous tissue: Secondary | ICD-10-CM | POA: Diagnosis not present

## 2022-08-01 DIAGNOSIS — Z85828 Personal history of other malignant neoplasm of skin: Secondary | ICD-10-CM | POA: Diagnosis not present

## 2022-08-14 DIAGNOSIS — I25119 Atherosclerotic heart disease of native coronary artery with unspecified angina pectoris: Secondary | ICD-10-CM | POA: Diagnosis not present

## 2022-08-14 DIAGNOSIS — E785 Hyperlipidemia, unspecified: Secondary | ICD-10-CM | POA: Diagnosis not present

## 2022-08-14 DIAGNOSIS — K8689 Other specified diseases of pancreas: Secondary | ICD-10-CM | POA: Diagnosis not present

## 2022-08-14 DIAGNOSIS — I130 Hypertensive heart and chronic kidney disease with heart failure and stage 1 through stage 4 chronic kidney disease, or unspecified chronic kidney disease: Secondary | ICD-10-CM | POA: Diagnosis not present

## 2022-08-14 DIAGNOSIS — N1831 Chronic kidney disease, stage 3a: Secondary | ICD-10-CM | POA: Diagnosis not present

## 2022-08-14 DIAGNOSIS — E039 Hypothyroidism, unspecified: Secondary | ICD-10-CM | POA: Diagnosis not present

## 2022-08-14 DIAGNOSIS — M353 Polymyalgia rheumatica: Secondary | ICD-10-CM | POA: Diagnosis not present

## 2022-08-14 DIAGNOSIS — G629 Polyneuropathy, unspecified: Secondary | ICD-10-CM | POA: Diagnosis not present

## 2022-08-14 DIAGNOSIS — I509 Heart failure, unspecified: Secondary | ICD-10-CM | POA: Diagnosis not present

## 2022-08-14 DIAGNOSIS — R269 Unspecified abnormalities of gait and mobility: Secondary | ICD-10-CM | POA: Diagnosis not present

## 2022-08-14 DIAGNOSIS — D649 Anemia, unspecified: Secondary | ICD-10-CM | POA: Diagnosis not present

## 2022-08-14 DIAGNOSIS — R7301 Impaired fasting glucose: Secondary | ICD-10-CM | POA: Diagnosis not present

## 2022-10-03 DIAGNOSIS — R5382 Chronic fatigue, unspecified: Secondary | ICD-10-CM | POA: Diagnosis not present

## 2022-10-03 DIAGNOSIS — Z515 Encounter for palliative care: Secondary | ICD-10-CM | POA: Diagnosis not present

## 2022-12-02 ENCOUNTER — Other Ambulatory Visit: Payer: Self-pay | Admitting: Internal Medicine

## 2023-01-06 ENCOUNTER — Other Ambulatory Visit: Payer: Self-pay | Admitting: Internal Medicine

## 2023-02-03 ENCOUNTER — Other Ambulatory Visit: Payer: Self-pay | Admitting: Internal Medicine

## 2023-02-08 ENCOUNTER — Other Ambulatory Visit: Payer: Self-pay | Admitting: *Deleted

## 2023-02-08 ENCOUNTER — Telehealth: Payer: Self-pay | Admitting: Internal Medicine

## 2023-02-08 NOTE — Telephone Encounter (Signed)
*  STAT* If patient is at the pharmacy, call can be transferred to refill team.   1. Which medications need to be refilled? (please list name of each medication and dose if known)  isosorbide mononitrate (IMDUR) 30 MG 24 hr tablet  2. Which pharmacy/location (including street and city if local pharmacy) is medication to be sent to? [facility] [id number] - [place], [place] - [address]   3. Do they need a 30 day or 90 day supply?  90 day supply

## 2023-02-16 ENCOUNTER — Other Ambulatory Visit: Payer: Self-pay | Admitting: Internal Medicine

## 2023-02-19 ENCOUNTER — Telehealth: Payer: Self-pay | Admitting: Internal Medicine

## 2023-02-19 NOTE — Telephone Encounter (Signed)
Pt's medication was sent to pt's pharmacy as requested. Confirmation received.  °

## 2023-02-19 NOTE — Telephone Encounter (Signed)
*  STAT* If patient is at the pharmacy, call can be transferred to refill team.   1. Which medications need to be refilled? (please list name of each medication and dose if known)   isosorbide mononitrate (IMDUR) 30 MG 24 hr tablet     2. Would you like to learn more about the convenience, safety, & potential cost savings by using the [facility]? No   3. Are you open to using the [facility] (Type [facility]. ) No   4. Which pharmacy/location (including street and city if local pharmacy) is medication to be sent to? [facility] [id number] [place], [place] - [address] Phone: [phone]  Fax: [phone]     5. Do they need a 30 day or 90 day supply? 90 day   Pt has scheduled appt on 06/24/23 and is out of medication

## 2023-02-22 ENCOUNTER — Other Ambulatory Visit (HOSPITAL_COMMUNITY): Payer: Self-pay

## 2023-06-13 ENCOUNTER — Telehealth: Payer: Self-pay | Admitting: Internal Medicine

## 2023-06-13 NOTE — Telephone Encounter (Signed)
Was unable to leave message. Will attempt again.

## 2023-06-13 NOTE — Telephone Encounter (Signed)
Pt c/o medication issue:  1. Name of Medication:   isosorbide mononitrate (IMDUR) 30 MG 24 hr tablet    2. How are you currently taking this medication (dosage and times per day)?   3. Are you having a reaction (difficulty breathing--STAT)?   4. What is your medication issue?  Patient's daughter is requesting call back to discuss having this medication refilled without scheduling an in-person appt. States that it is harder to get her father to appts. Requesting call back to discuss further.

## 2023-06-19 NOTE — Telephone Encounter (Signed)
Pt daughter Rosann Auerbach calling in [place] number: [phone]

## 2023-06-21 NOTE — Telephone Encounter (Signed)
  Daughter is calling back to check on the medication

## 2023-06-21 NOTE — Telephone Encounter (Signed)
Spoke with Daughter. She would like to know if there is a way to get a virtual visit for Pt as he is unable to get around and to appointments. We spoke about PCP following this medication also and if they are agreeable that may be the best plan of action for their family. She is going to let us know what they say and I will speak to Dr Ladona Ridgel about arrangements over wise.

## 2023-06-24 ENCOUNTER — Ambulatory Visit: Payer: PPO | Admitting: Internal Medicine

## 2023-06-25 NOTE — Telephone Encounter (Signed)
 Call keep ringing until it was shut off.  -Calling to tell daughter that DrTaylor thinks it would be a good idea for Gary Gonzalez to get his prescriptions from PCP. Was calling to tell her before his appt with PCP this Thursday.

## 2023-06-27 DIAGNOSIS — I130 Hypertensive heart and chronic kidney disease with heart failure and stage 1 through stage 4 chronic kidney disease, or unspecified chronic kidney disease: Secondary | ICD-10-CM | POA: Diagnosis not present

## 2023-06-27 DIAGNOSIS — Z0000 Encounter for general adult medical examination without abnormal findings: Secondary | ICD-10-CM | POA: Diagnosis not present

## 2023-06-27 DIAGNOSIS — M109 Gout, unspecified: Secondary | ICD-10-CM | POA: Diagnosis not present

## 2023-06-27 DIAGNOSIS — M353 Polymyalgia rheumatica: Secondary | ICD-10-CM | POA: Diagnosis not present

## 2023-06-27 DIAGNOSIS — N1831 Chronic kidney disease, stage 3a: Secondary | ICD-10-CM | POA: Diagnosis not present

## 2023-06-27 DIAGNOSIS — I25119 Atherosclerotic heart disease of native coronary artery with unspecified angina pectoris: Secondary | ICD-10-CM | POA: Diagnosis not present

## 2023-06-27 DIAGNOSIS — E291 Testicular hypofunction: Secondary | ICD-10-CM | POA: Diagnosis not present

## 2023-06-27 DIAGNOSIS — Z125 Encounter for screening for malignant neoplasm of prostate: Secondary | ICD-10-CM | POA: Diagnosis not present

## 2023-06-27 DIAGNOSIS — I509 Heart failure, unspecified: Secondary | ICD-10-CM | POA: Diagnosis not present

## 2023-06-27 DIAGNOSIS — G629 Polyneuropathy, unspecified: Secondary | ICD-10-CM | POA: Diagnosis not present

## 2023-06-27 DIAGNOSIS — R4589 Other symptoms and signs involving emotional state: Secondary | ICD-10-CM | POA: Diagnosis not present

## 2023-06-27 DIAGNOSIS — R7301 Impaired fasting glucose: Secondary | ICD-10-CM | POA: Diagnosis not present

## 2023-06-27 DIAGNOSIS — E785 Hyperlipidemia, unspecified: Secondary | ICD-10-CM | POA: Diagnosis not present

## 2023-06-27 DIAGNOSIS — I493 Ventricular premature depolarization: Secondary | ICD-10-CM | POA: Diagnosis not present

## 2023-06-27 DIAGNOSIS — Z1389 Encounter for screening for other disorder: Secondary | ICD-10-CM | POA: Diagnosis not present

## 2023-06-27 DIAGNOSIS — H6123 Impacted cerumen, bilateral: Secondary | ICD-10-CM | POA: Diagnosis not present

## 2023-06-27 DIAGNOSIS — D649 Anemia, unspecified: Secondary | ICD-10-CM | POA: Diagnosis not present

## 2023-06-27 DIAGNOSIS — E039 Hypothyroidism, unspecified: Secondary | ICD-10-CM | POA: Diagnosis not present

## 2023-06-28 DIAGNOSIS — R82998 Other abnormal findings in urine: Secondary | ICD-10-CM | POA: Diagnosis not present

## 2023-07-02 DIAGNOSIS — H52203 Unspecified astigmatism, bilateral: Secondary | ICD-10-CM | POA: Diagnosis not present

## 2023-07-02 DIAGNOSIS — Z961 Presence of intraocular lens: Secondary | ICD-10-CM | POA: Diagnosis not present

## 2023-07-02 DIAGNOSIS — H353131 Nonexudative age-related macular degeneration, bilateral, early dry stage: Secondary | ICD-10-CM | POA: Diagnosis not present

## 2023-07-02 DIAGNOSIS — H57813 Brow ptosis, bilateral: Secondary | ICD-10-CM | POA: Diagnosis not present

## 2024-03-03 DIAGNOSIS — N1831 Chronic kidney disease, stage 3a: Secondary | ICD-10-CM | POA: Diagnosis not present

## 2024-03-03 DIAGNOSIS — Z23 Encounter for immunization: Secondary | ICD-10-CM | POA: Diagnosis not present

## 2024-03-03 DIAGNOSIS — I130 Hypertensive heart and chronic kidney disease with heart failure and stage 1 through stage 4 chronic kidney disease, or unspecified chronic kidney disease: Secondary | ICD-10-CM | POA: Diagnosis not present

## 2024-03-03 DIAGNOSIS — E039 Hypothyroidism, unspecified: Secondary | ICD-10-CM | POA: Diagnosis not present

## 2024-03-03 DIAGNOSIS — E785 Hyperlipidemia, unspecified: Secondary | ICD-10-CM | POA: Diagnosis not present

## 2024-03-03 DIAGNOSIS — R7301 Impaired fasting glucose: Secondary | ICD-10-CM | POA: Diagnosis not present
